# Patient Record
Sex: Female | Born: 1950 | Race: Black or African American | Hispanic: No | Smoking: Former smoker
Health system: Southern US, Community
[De-identification: ages and names within clinical notes are randomized; demographics above are authoritative.]

## PROBLEM LIST (undated history)

## (undated) DIAGNOSIS — R0602 Shortness of breath: Secondary | ICD-10-CM

## (undated) DIAGNOSIS — J449 Chronic obstructive pulmonary disease, unspecified: Secondary | ICD-10-CM

## (undated) DIAGNOSIS — M549 Dorsalgia, unspecified: Secondary | ICD-10-CM

## (undated) DIAGNOSIS — M1990 Unspecified osteoarthritis, unspecified site: Secondary | ICD-10-CM

## (undated) DIAGNOSIS — I739 Peripheral vascular disease, unspecified: Secondary | ICD-10-CM

## (undated) DIAGNOSIS — I2510 Atherosclerotic heart disease of native coronary artery without angina pectoris: Secondary | ICD-10-CM

## (undated) DIAGNOSIS — G4730 Sleep apnea, unspecified: Secondary | ICD-10-CM

## (undated) DIAGNOSIS — K5900 Constipation, unspecified: Secondary | ICD-10-CM

## (undated) DIAGNOSIS — M069 Rheumatoid arthritis, unspecified: Secondary | ICD-10-CM

## (undated) DIAGNOSIS — F17200 Nicotine dependence, unspecified, uncomplicated: Secondary | ICD-10-CM

## (undated) DIAGNOSIS — G4733 Obstructive sleep apnea (adult) (pediatric): Secondary | ICD-10-CM

## (undated) DIAGNOSIS — E785 Hyperlipidemia, unspecified: Secondary | ICD-10-CM

## (undated) DIAGNOSIS — E559 Vitamin D deficiency, unspecified: Secondary | ICD-10-CM

## (undated) DIAGNOSIS — R7303 Prediabetes: Secondary | ICD-10-CM

## (undated) DIAGNOSIS — I10 Essential (primary) hypertension: Secondary | ICD-10-CM

## (undated) DIAGNOSIS — F32A Depression, unspecified: Secondary | ICD-10-CM

## (undated) DIAGNOSIS — I252 Old myocardial infarction: Secondary | ICD-10-CM

## (undated) DIAGNOSIS — I519 Heart disease, unspecified: Secondary | ICD-10-CM

## (undated) DIAGNOSIS — Z86718 Personal history of other venous thrombosis and embolism: Secondary | ICD-10-CM

## (undated) DIAGNOSIS — M109 Gout, unspecified: Secondary | ICD-10-CM

## (undated) DIAGNOSIS — M2550 Pain in unspecified joint: Secondary | ICD-10-CM

## (undated) DIAGNOSIS — Z9989 Dependence on other enabling machines and devices: Secondary | ICD-10-CM

## (undated) DIAGNOSIS — E039 Hypothyroidism, unspecified: Secondary | ICD-10-CM

## (undated) DIAGNOSIS — I6529 Occlusion and stenosis of unspecified carotid artery: Secondary | ICD-10-CM

## (undated) DIAGNOSIS — I219 Acute myocardial infarction, unspecified: Secondary | ICD-10-CM

## (undated) DIAGNOSIS — F419 Anxiety disorder, unspecified: Secondary | ICD-10-CM

## (undated) DIAGNOSIS — I8390 Asymptomatic varicose veins of unspecified lower extremity: Secondary | ICD-10-CM

## (undated) DIAGNOSIS — F329 Major depressive disorder, single episode, unspecified: Secondary | ICD-10-CM

## (undated) HISTORY — DX: Hypothyroidism, unspecified: E03.9

## (undated) HISTORY — DX: Hyperlipidemia, unspecified: E78.5

## (undated) HISTORY — DX: Pain in unspecified joint: M25.50

## (undated) HISTORY — DX: Heart disease, unspecified: I51.9

## (undated) HISTORY — DX: Nicotine dependence, unspecified, uncomplicated: F17.200

## (undated) HISTORY — DX: Chronic obstructive pulmonary disease, unspecified: J44.9

## (undated) HISTORY — DX: Occlusion and stenosis of unspecified carotid artery: I65.29

## (undated) HISTORY — DX: Rheumatoid arthritis, unspecified: M06.9

## (undated) HISTORY — DX: Personal history of other venous thrombosis and embolism: Z86.718

## (undated) HISTORY — DX: Asymptomatic varicose veins of unspecified lower extremity: I83.90

## (undated) HISTORY — DX: Sleep apnea, unspecified: G47.30

## (undated) HISTORY — DX: Shortness of breath: R06.02

## (undated) HISTORY — DX: Old myocardial infarction: I25.2

## (undated) HISTORY — DX: Unspecified osteoarthritis, unspecified site: M19.90

## (undated) HISTORY — DX: Essential (primary) hypertension: I10

## (undated) HISTORY — DX: Peripheral vascular disease, unspecified: I73.9

## (undated) HISTORY — DX: Dorsalgia, unspecified: M54.9

## (undated) HISTORY — DX: Atherosclerotic heart disease of native coronary artery without angina pectoris: I25.10

## (undated) HISTORY — DX: Constipation, unspecified: K59.00

## (undated) HISTORY — PX: RADIOACTIVE PLAQUE INSERTION: SHX2288

## (undated) HISTORY — DX: Prediabetes: R73.03

## (undated) HISTORY — PX: CORONARY ANGIOPLASTY: SHX604

## (undated) HISTORY — DX: Vitamin D deficiency, unspecified: E55.9

## (undated) HISTORY — PX: CAROTID STENT: SHX1301

---

## 1997-08-21 ENCOUNTER — Other Ambulatory Visit: Admission: RE | Admit: 1997-08-21 | Discharge: 1997-08-21 | Payer: Self-pay | Admitting: Internal Medicine

## 1998-03-16 ENCOUNTER — Other Ambulatory Visit: Admission: RE | Admit: 1998-03-16 | Discharge: 1998-03-16 | Payer: Self-pay | Admitting: Obstetrics and Gynecology

## 1998-08-10 ENCOUNTER — Encounter: Admission: RE | Admit: 1998-08-10 | Discharge: 1998-08-10 | Payer: Self-pay | Admitting: Internal Medicine

## 1998-08-31 ENCOUNTER — Encounter: Admission: RE | Admit: 1998-08-31 | Discharge: 1998-08-31 | Payer: Self-pay | Admitting: Internal Medicine

## 1998-12-04 ENCOUNTER — Encounter: Payer: Self-pay | Admitting: Internal Medicine

## 1998-12-04 ENCOUNTER — Ambulatory Visit (HOSPITAL_COMMUNITY): Admission: RE | Admit: 1998-12-04 | Discharge: 1998-12-04 | Payer: Self-pay | Admitting: Internal Medicine

## 1998-12-05 ENCOUNTER — Encounter: Payer: Self-pay | Admitting: Internal Medicine

## 1998-12-05 ENCOUNTER — Ambulatory Visit (HOSPITAL_COMMUNITY): Admission: RE | Admit: 1998-12-05 | Discharge: 1998-12-05 | Payer: Self-pay | Admitting: Internal Medicine

## 1998-12-06 ENCOUNTER — Inpatient Hospital Stay (HOSPITAL_COMMUNITY): Admission: AD | Admit: 1998-12-06 | Discharge: 1998-12-10 | Payer: Self-pay | Admitting: Internal Medicine

## 1999-01-11 ENCOUNTER — Ambulatory Visit (HOSPITAL_COMMUNITY): Admission: RE | Admit: 1999-01-11 | Discharge: 1999-01-12 | Payer: Self-pay | Admitting: Interventional Radiology

## 1999-02-25 ENCOUNTER — Encounter: Payer: Self-pay | Admitting: Internal Medicine

## 1999-02-25 ENCOUNTER — Ambulatory Visit (HOSPITAL_COMMUNITY): Admission: RE | Admit: 1999-02-25 | Discharge: 1999-02-25 | Payer: Self-pay | Admitting: Internal Medicine

## 1999-11-01 ENCOUNTER — Encounter: Payer: Self-pay | Admitting: Gynecology

## 1999-11-01 ENCOUNTER — Encounter: Admission: RE | Admit: 1999-11-01 | Discharge: 1999-11-01 | Payer: Self-pay | Admitting: Gynecology

## 2000-05-19 ENCOUNTER — Other Ambulatory Visit: Admission: RE | Admit: 2000-05-19 | Discharge: 2000-05-19 | Payer: Self-pay | Admitting: Internal Medicine

## 2001-10-11 ENCOUNTER — Ambulatory Visit (HOSPITAL_COMMUNITY): Admission: RE | Admit: 2001-10-11 | Discharge: 2001-10-11 | Payer: Self-pay | Admitting: Interventional Radiology

## 2001-10-28 ENCOUNTER — Ambulatory Visit (HOSPITAL_COMMUNITY): Admission: RE | Admit: 2001-10-28 | Discharge: 2001-10-28 | Payer: Self-pay | Admitting: Internal Medicine

## 2002-10-16 ENCOUNTER — Encounter: Payer: Self-pay | Admitting: Emergency Medicine

## 2002-10-16 ENCOUNTER — Emergency Department (HOSPITAL_COMMUNITY): Admission: EM | Admit: 2002-10-16 | Discharge: 2002-10-16 | Payer: Self-pay | Admitting: Emergency Medicine

## 2004-01-06 ENCOUNTER — Emergency Department (HOSPITAL_COMMUNITY): Admission: EM | Admit: 2004-01-06 | Discharge: 2004-01-06 | Payer: Self-pay | Admitting: Emergency Medicine

## 2004-01-07 ENCOUNTER — Ambulatory Visit (HOSPITAL_COMMUNITY): Admission: RE | Admit: 2004-01-07 | Discharge: 2004-01-07 | Payer: Self-pay | Admitting: Emergency Medicine

## 2005-04-17 ENCOUNTER — Encounter: Admission: RE | Admit: 2005-04-17 | Discharge: 2005-04-17 | Payer: Self-pay | Admitting: Internal Medicine

## 2006-08-04 ENCOUNTER — Encounter: Admission: RE | Admit: 2006-08-04 | Discharge: 2006-08-04 | Payer: Self-pay | Admitting: Internal Medicine

## 2007-12-24 ENCOUNTER — Emergency Department (HOSPITAL_COMMUNITY): Admission: EM | Admit: 2007-12-24 | Discharge: 2007-12-24 | Payer: Self-pay | Admitting: Emergency Medicine

## 2009-05-04 ENCOUNTER — Encounter: Payer: Self-pay | Admitting: Internal Medicine

## 2009-05-04 ENCOUNTER — Encounter: Admission: RE | Admit: 2009-05-04 | Discharge: 2009-05-04 | Payer: Self-pay | Admitting: Family Medicine

## 2009-05-04 LAB — CONVERTED CEMR LAB
ALT: 12 units/L
BUN: 13 mg/dL
Calcium: 9.4 mg/dL
Chloride: 104 meq/L
Creatinine, Ser: 0.81 mg/dL
Glucose, Bld: 80 mg/dL
HCT: 43.5 %
Hemoglobin: 14.8 g/dL
Potassium: 4.1 meq/L
RDW: 13.7 %
Sodium: 140 meq/L
TSH: 1.79 microintl units/mL
Total Protein: 7.3 g/dL
WBC: 9.2 10*3/uL

## 2009-05-11 ENCOUNTER — Encounter: Payer: Self-pay | Admitting: Internal Medicine

## 2009-05-11 ENCOUNTER — Encounter: Admission: RE | Admit: 2009-05-11 | Discharge: 2009-05-11 | Payer: Self-pay | Admitting: Family Medicine

## 2009-05-11 LAB — HM MAMMOGRAPHY: HM Mammogram: NEGATIVE

## 2009-06-15 DIAGNOSIS — I2510 Atherosclerotic heart disease of native coronary artery without angina pectoris: Secondary | ICD-10-CM

## 2009-06-15 HISTORY — DX: Atherosclerotic heart disease of native coronary artery without angina pectoris: I25.10

## 2009-06-29 ENCOUNTER — Ambulatory Visit: Payer: Self-pay | Admitting: Cardiovascular Disease

## 2009-06-30 ENCOUNTER — Ambulatory Visit: Payer: Self-pay | Admitting: Diagnostic Radiology

## 2009-06-30 ENCOUNTER — Observation Stay (HOSPITAL_COMMUNITY): Admission: EM | Admit: 2009-06-30 | Discharge: 2009-07-03 | Payer: Self-pay | Admitting: Internal Medicine

## 2009-06-30 ENCOUNTER — Ambulatory Visit: Payer: Self-pay | Admitting: Cardiovascular Disease

## 2009-06-30 ENCOUNTER — Encounter: Payer: Self-pay | Admitting: Emergency Medicine

## 2009-07-01 ENCOUNTER — Encounter (INDEPENDENT_AMBULATORY_CARE_PROVIDER_SITE_OTHER): Payer: Self-pay | Admitting: Internal Medicine

## 2009-07-02 ENCOUNTER — Encounter: Payer: Self-pay | Admitting: Cardiology

## 2009-07-02 ENCOUNTER — Encounter (INDEPENDENT_AMBULATORY_CARE_PROVIDER_SITE_OTHER): Payer: Self-pay | Admitting: Internal Medicine

## 2009-07-02 ENCOUNTER — Ambulatory Visit: Payer: Self-pay | Admitting: Vascular Surgery

## 2009-07-13 ENCOUNTER — Telehealth: Payer: Self-pay | Admitting: Cardiology

## 2009-07-17 ENCOUNTER — Telehealth (INDEPENDENT_AMBULATORY_CARE_PROVIDER_SITE_OTHER): Payer: Self-pay | Admitting: *Deleted

## 2009-07-23 DIAGNOSIS — E890 Postprocedural hypothyroidism: Secondary | ICD-10-CM | POA: Insufficient documentation

## 2009-07-23 DIAGNOSIS — I10 Essential (primary) hypertension: Secondary | ICD-10-CM | POA: Insufficient documentation

## 2009-07-23 DIAGNOSIS — Z862 Personal history of diseases of the blood and blood-forming organs and certain disorders involving the immune mechanism: Secondary | ICD-10-CM | POA: Insufficient documentation

## 2009-07-23 DIAGNOSIS — Z8639 Personal history of other endocrine, nutritional and metabolic disease: Secondary | ICD-10-CM

## 2009-07-23 DIAGNOSIS — E785 Hyperlipidemia, unspecified: Secondary | ICD-10-CM | POA: Insufficient documentation

## 2009-07-23 DIAGNOSIS — I739 Peripheral vascular disease, unspecified: Secondary | ICD-10-CM | POA: Insufficient documentation

## 2009-07-23 DIAGNOSIS — E039 Hypothyroidism, unspecified: Secondary | ICD-10-CM | POA: Insufficient documentation

## 2009-07-23 DIAGNOSIS — I2510 Atherosclerotic heart disease of native coronary artery without angina pectoris: Secondary | ICD-10-CM | POA: Insufficient documentation

## 2009-07-24 ENCOUNTER — Ambulatory Visit: Payer: Self-pay | Admitting: Cardiology

## 2009-07-24 ENCOUNTER — Encounter: Payer: Self-pay | Admitting: Cardiology

## 2009-07-24 DIAGNOSIS — I6529 Occlusion and stenosis of unspecified carotid artery: Secondary | ICD-10-CM | POA: Insufficient documentation

## 2009-07-30 ENCOUNTER — Telehealth: Payer: Self-pay | Admitting: Cardiology

## 2009-08-02 ENCOUNTER — Encounter (HOSPITAL_COMMUNITY): Admission: RE | Admit: 2009-08-02 | Discharge: 2009-09-10 | Payer: Self-pay | Admitting: Cardiology

## 2009-08-04 ENCOUNTER — Encounter: Payer: Self-pay | Admitting: Cardiology

## 2009-08-09 ENCOUNTER — Telehealth: Payer: Self-pay | Admitting: Cardiology

## 2009-08-10 ENCOUNTER — Encounter: Payer: Self-pay | Admitting: Cardiology

## 2009-08-10 ENCOUNTER — Telehealth (INDEPENDENT_AMBULATORY_CARE_PROVIDER_SITE_OTHER): Payer: Self-pay | Admitting: Physician Assistant

## 2009-08-17 ENCOUNTER — Encounter: Payer: Self-pay | Admitting: Cardiology

## 2009-08-17 ENCOUNTER — Telehealth: Payer: Self-pay | Admitting: Nurse Practitioner

## 2009-08-22 ENCOUNTER — Encounter: Payer: Self-pay | Admitting: Cardiology

## 2009-08-23 ENCOUNTER — Telehealth: Payer: Self-pay | Admitting: Cardiology

## 2009-08-27 ENCOUNTER — Ambulatory Visit: Payer: Self-pay | Admitting: Cardiology

## 2009-08-27 ENCOUNTER — Ambulatory Visit: Payer: Self-pay

## 2009-08-27 ENCOUNTER — Telehealth (INDEPENDENT_AMBULATORY_CARE_PROVIDER_SITE_OTHER): Payer: Self-pay | Admitting: *Deleted

## 2009-08-27 LAB — CONVERTED CEMR LAB
ALT: 14 units/L (ref 0–35)
AST: 19 units/L (ref 0–37)
Albumin: 4 g/dL (ref 3.5–5.2)
Alkaline Phosphatase: 79 units/L (ref 39–117)
Cholesterol: 150 mg/dL (ref 0–200)
LDL Cholesterol: 81 mg/dL (ref 0–99)
Total Bilirubin: 0.4 mg/dL (ref 0.3–1.2)
VLDL: 14.2 mg/dL (ref 0.0–40.0)

## 2009-08-29 ENCOUNTER — Encounter: Payer: Self-pay | Admitting: Cardiology

## 2009-08-30 ENCOUNTER — Encounter: Payer: Self-pay | Admitting: Cardiology

## 2009-08-31 ENCOUNTER — Ambulatory Visit: Payer: Self-pay | Admitting: Cardiology

## 2009-10-30 ENCOUNTER — Encounter: Payer: Self-pay | Admitting: Cardiology

## 2009-11-04 ENCOUNTER — Observation Stay (HOSPITAL_COMMUNITY): Admission: EM | Admit: 2009-11-04 | Discharge: 2009-11-05 | Payer: Self-pay | Admitting: Emergency Medicine

## 2009-11-04 ENCOUNTER — Ambulatory Visit: Payer: Self-pay | Admitting: Cardiology

## 2009-11-04 LAB — CONVERTED CEMR LAB: TSH: 5.08 microintl units/mL

## 2009-11-05 ENCOUNTER — Encounter: Payer: Self-pay | Admitting: Cardiology

## 2009-11-12 ENCOUNTER — Telehealth (INDEPENDENT_AMBULATORY_CARE_PROVIDER_SITE_OTHER): Payer: Self-pay | Admitting: *Deleted

## 2009-11-13 ENCOUNTER — Encounter: Payer: Self-pay | Admitting: Internal Medicine

## 2009-11-13 ENCOUNTER — Ambulatory Visit: Payer: Self-pay

## 2009-11-13 ENCOUNTER — Encounter (HOSPITAL_COMMUNITY): Admission: RE | Admit: 2009-11-13 | Discharge: 2009-12-05 | Payer: Self-pay | Admitting: Cardiology

## 2009-11-13 ENCOUNTER — Ambulatory Visit: Payer: Self-pay | Admitting: Internal Medicine

## 2009-11-20 ENCOUNTER — Ambulatory Visit: Payer: Self-pay

## 2009-11-23 ENCOUNTER — Telehealth: Payer: Self-pay | Admitting: Cardiology

## 2009-12-11 ENCOUNTER — Ambulatory Visit: Payer: Self-pay | Admitting: Cardiology

## 2010-01-09 ENCOUNTER — Telehealth: Payer: Self-pay | Admitting: Cardiology

## 2010-01-28 ENCOUNTER — Telehealth: Payer: Self-pay | Admitting: Cardiology

## 2010-02-04 ENCOUNTER — Encounter: Payer: Self-pay | Admitting: Internal Medicine

## 2010-02-04 ENCOUNTER — Telehealth (INDEPENDENT_AMBULATORY_CARE_PROVIDER_SITE_OTHER): Payer: Self-pay | Admitting: *Deleted

## 2010-02-04 LAB — CONVERTED CEMR LAB
Albumin: 4.1 g/dL
BUN: 17 mg/dL
Cholesterol: 232 mg/dL
Glucose, Bld: 74 mg/dL
HDL: 58 mg/dL
TSH: 1.69 microintl units/mL
Triglyceride fasting, serum: 114 mg/dL

## 2010-02-22 ENCOUNTER — Telehealth: Payer: Self-pay | Admitting: Cardiology

## 2010-02-28 ENCOUNTER — Ambulatory Visit: Payer: Self-pay | Admitting: Cardiology

## 2010-03-07 ENCOUNTER — Ambulatory Visit: Payer: Self-pay | Admitting: Cardiology

## 2010-03-07 ENCOUNTER — Encounter: Payer: Self-pay | Admitting: Cardiology

## 2010-03-07 DIAGNOSIS — R5381 Other malaise: Secondary | ICD-10-CM | POA: Insufficient documentation

## 2010-03-07 DIAGNOSIS — R5383 Other fatigue: Secondary | ICD-10-CM

## 2010-03-13 ENCOUNTER — Ambulatory Visit: Payer: Self-pay | Admitting: Internal Medicine

## 2010-03-15 DIAGNOSIS — K120 Recurrent oral aphthae: Secondary | ICD-10-CM | POA: Insufficient documentation

## 2010-03-15 DIAGNOSIS — F17200 Nicotine dependence, unspecified, uncomplicated: Secondary | ICD-10-CM | POA: Insufficient documentation

## 2010-03-15 DIAGNOSIS — E559 Vitamin D deficiency, unspecified: Secondary | ICD-10-CM | POA: Insufficient documentation

## 2010-03-17 DIAGNOSIS — I219 Acute myocardial infarction, unspecified: Secondary | ICD-10-CM

## 2010-03-17 HISTORY — DX: Acute myocardial infarction, unspecified: I21.9

## 2010-03-22 ENCOUNTER — Ambulatory Visit: Admission: RE | Admit: 2010-03-22 | Discharge: 2010-03-22 | Payer: Self-pay | Source: Home / Self Care

## 2010-03-22 ENCOUNTER — Encounter: Payer: Self-pay | Admitting: Cardiovascular Disease

## 2010-03-28 ENCOUNTER — Telehealth: Payer: Self-pay | Admitting: Internal Medicine

## 2010-03-29 ENCOUNTER — Telehealth: Payer: Self-pay | Admitting: Internal Medicine

## 2010-04-01 ENCOUNTER — Ambulatory Visit
Admission: RE | Admit: 2010-04-01 | Discharge: 2010-04-01 | Payer: Self-pay | Source: Home / Self Care | Attending: Cardiovascular Disease | Admitting: Cardiovascular Disease

## 2010-04-01 ENCOUNTER — Telehealth: Payer: Self-pay | Admitting: Internal Medicine

## 2010-04-01 ENCOUNTER — Other Ambulatory Visit: Payer: Self-pay | Admitting: Cardiovascular Disease

## 2010-04-01 LAB — LIPID PANEL
Cholesterol: 199 mg/dL (ref 0–200)
HDL: 48.8 mg/dL (ref >39.00)
LDL Cholesterol: 123 mg/dL — ABNORMAL HIGH (ref 0–99)
Total CHOL/HDL Ratio: 4
Triglycerides: 135 mg/dL (ref 0.0–149.0)
VLDL: 27 mg/dL (ref 0.0–40.0)

## 2010-04-01 LAB — HEPATIC FUNCTION PANEL
ALT: 16 U/L (ref 0–35)
AST: 20 U/L (ref 0–37)
Albumin: 3.9 g/dL (ref 3.5–5.2)
Alkaline Phosphatase: 76 U/L (ref 39–117)
Bilirubin, Direct: 0.1 mg/dL (ref 0.0–0.3)
Total Bilirubin: 0.3 mg/dL (ref 0.3–1.2)
Total Protein: 7 g/dL (ref 6.0–8.3)

## 2010-04-02 ENCOUNTER — Telehealth: Payer: Self-pay | Admitting: Cardiovascular Disease

## 2010-04-10 ENCOUNTER — Telehealth: Payer: Self-pay | Admitting: Internal Medicine

## 2010-04-15 ENCOUNTER — Telehealth: Payer: Self-pay | Admitting: Cardiovascular Disease

## 2010-04-16 NOTE — Assessment & Plan Note (Signed)
Summary: Cardiology Nuclear Testing  Nuclear Med Background Indications for Stress Test: Evaluation for Ischemia, Post Hospital  Indications Comments: Discharge from hospital 11/05/09 for CP negative enzymes  History: Heart Catheterization, Myocardial Infarction, Stents  History Comments: 4/11 MI-NSTEMI 5/11Cath 90%RCA,Mod residual CAD and stent x2  RCA.  Symptoms: Chest Pain, Fatigue    Nuclear Pre-Procedure Cardiac Risk Factors: Carotid Disease, Family History - CAD, History of Smoking, Hypertension, Lipids Caffeine/Decaff Intake: None NPO After: 8:00 PM Lungs: clear IV 0.9% NS with Angio Cath: 20g     IV Site: R Antecubital IV Started by: Irean Hong, RN Chest Size (in) 44     Cup Size D     Height (in): 67 Weight (lb): 231 BMI: 36.31  Nuclear Med Study 1 or 2 day study:  2 day     Stress Test Type:  Stress Reading MD:  Olga Millers, MD     Referring MD:  Mike Gip Resting Radionuclide:  Technetium 88m Tetrofosmin     Resting Radionuclide Dose:  32.7 mCi  Stress Radionuclide:  Technetium 44m Tetrofosmin     Stress Radionuclide Dose:  33 mCi   Stress Protocol Exercise Time (min):  4:54 min     Max HR:  134 bpm     Predicted Max HR:  164 bpm  Max Systolic BP: 213 mm Hg     Percent Max HR:  81.71 %     METS: 6.9 Rate Pressure Product:  16109    Stress Test Technologist:  Cathlyn Parsons, RN     Nuclear Technologist:  Doyne Keel, CNMT  Rest Procedure  Myocardial perfusion imaging was performed at rest 45 minutes following the intravenous administration of Technetium 74m Tetrofosmin.  Stress Procedure  The patient exercised for 4:54 .  The patient stopped due to fatigue and hypertensive BP response.  Pt denied any chest pain.  There were nonspecific ST-T wave changes.  Patient had frequent PVC's with couplet and trigeminy.Technetium 43m Tetrofosmin was injected at peak exercise and myocardial perfusion imaging was performed after a brief delay.  QPS Raw  Data Images:  Normal; no motion artifact; normal heart/lung ratio. Stress Images:  Mildly decreased uptake in the anterior wall Rest Images:  Decreased uptake in the anterior wall Subtraction (SDS):  There is decreased uptake in the anterior wall on the rest images which improves with stress. This is consistent with breast attenuation. No ischemia. Transient Ischemic Dilatation:  .89  (Normal <1.22)  Lung/Heart Ratio:  .28  (Normal <0.45)  Quantitative Gated Spect Images QGS EDV:  93 ml QGS ESV:  37 ml QGS EF:  60 % QGS cine images:  Normal  Findings Normal nuclear study      Overall Impression  Exercise Capacity: Fair exercise capacity. BP Response: Hypertensive blood pressure response. Clinical Symptoms: Fatigue ECG Impression: No significant ST segment change suggestive of ischemia. Overall Impression: Normal stress nuclear study. Overall Impression Comments: There is decreased uptake in the anterior wall on the rest images which improves with stress. This is consistent with breast attenuation. No ischemia.  Appended Document: Cardiology Nuclear Testing I left a message of the pt's results on her identified VM.  Appended Document: Cardiology Nuclear Testing ok

## 2010-04-16 NOTE — Miscellaneous (Signed)
Summary: [facility]   [facility] Note   Imported By: Roderic Ovens 09/14/2009 10:14:23  _____________________________________________________________________  External Attachment:    Type:   Image     Comment:   External Document

## 2010-04-16 NOTE — Miscellaneous (Signed)
Summary: [facility]   [facility] Note   Imported By: Roderic Ovens 09/18/2009 13:07:37  _____________________________________________________________________  External Attachment:    Type:   Image     Comment:   External Document

## 2010-04-16 NOTE — Progress Notes (Signed)
  Phone Note Call from Patient   Call For: Dr Charlies Constable Reason for Call: Talk to Doctor Details for Reason: Low Heart Rate Summary of Call: Pt seen in rehab w/ bradycardia, HR 40s. C/O fatigue, sleeping poorly. With low HR advised her OK to take 1/2 Lopressor25mg  two times a day. Do not stop it w/out calling. OK to take Benadryl at bedtime to sleep as needed. F/U w/ Dr Juanda Chance - pls call for appt.   Initial call taken by: Park Breed PA-C,  Aug 10, 2009 3:43 PM

## 2010-04-16 NOTE — Miscellaneous (Signed)
Summary: med list  Clinical Lists Changes  Medications: Changed medication from DRISDOL 69629 UNIT CAPS (ERGOCALCIFEROL) 1 capsule on mon-and-thursday to DRISDOL 52841 UNIT CAPS (ERGOCALCIFEROL) 1 capsule on mon-and-thursday(VIT D-2) Observations: Added new observation of MEDRECON: current updated (12/11/2009 9:48)      Current Medications (verified): 1)  Aspirin Ec 325 Mg Tbec (Aspirin) .... Take One Tablet By Mouth Daily 2)  Amlodipine Besylate 10 Mg Tabs (Amlodipine Besylate) .... Take One Tablet By Mouth Daily 3)  Plavix 75 Mg Tabs (Clopidogrel Bisulfate) .Marland Kitchen.. 1 Tab Qd 4)  Accupril 20 Mg Tabs (Quinapril Hcl) .Marland Kitchen.. 1 Tab Once Daily 5)  Nitrostat 0.4 Mg Subl (Nitroglycerin) .Marland Kitchen.. 1 Tablet Under Tongue At Onset of Chest Pain; You May Repeat Every 5 Minutes For Up To 3 Doses. 6)  Synthroid 100 Mcg Tabs (Levothyroxine Sodium) .Marland Kitchen.. 1 Tab Once Daily 7)  Drisdol 32440 Unit Caps (Ergocalciferol) .Marland Kitchen.. 1 Capsule On Mon-And-Thursday(Vit D-2) 8)  Crestor 40 Mg Tabs (Rosuvastatin Calcium) .Marland Kitchen.. 1 Tab Once Daily  Allergies: No Known Drug Allergies  Appended Document: med list/added test results nuc    Clinical Lists Changes  Observations: Added new observation of NUCLEAR NOS:  Overall Impression   Exercise Capacity: Fair exercise capacity. BP Response: Hypertensive blood pressure response. Clinical Symptoms: Fatigue ECG Impression: No significant ST segment change suggestive of ischemia. Overall Impression: Normal stress nuclear study. Overall Impression Comments: There is decreased uptake in the anterior wall on the rest images which improves with stress. This is consistent with breast attenuation. No ischemia.  (11/13/2009 9:51) Added new observation of CXR RESULTS:    IMPRESSION:   No acute cardiopulmonary process.  (11/04/2009 9:54)       Nuclear Study  Procedure date:  11/13/2009  Findings:       Overall Impression   Exercise Capacity: Fair exercise capacity. BP  Response: Hypertensive blood pressure response. Clinical Symptoms: Fatigue ECG Impression: No significant ST segment change suggestive of ischemia. Overall Impression: Normal stress nuclear study. Overall Impression Comments: There is decreased uptake in the anterior wall on the rest images which improves with stress. This is consistent with breast attenuation. No ischemia.   CXR  Procedure date:  11/04/2009  Findings:         IMPRESSION:   No acute cardiopulmonary process.

## 2010-04-16 NOTE — Progress Notes (Signed)
  Phone Note From Other Clinic   Caller: Angie/Dr.Turnbull Initial call taken by: KM    LOV faxed to [phone] [facility]  February 04, 2010 10:40 AM

## 2010-04-16 NOTE — Progress Notes (Signed)
Summary: refill  Phone Note Refill Request Message from:  Patient on January 28, 2010 4:27 PM  Refills Requested: Medication #1:  ACCUPRIL 20 MG TABS take one tablet by mouth once daily along with a 10mg  accupril tablet once daily Health department [phone]  Initial call taken by: Judie Grieve,  January 28, 2010 4:28 PM  Follow-up for Phone Call       Follow-up by: Judithe Modest CMA,  January 28, 2010 4:47 PM    Prescriptions: ACCUPRIL 20 MG TABS (QUINAPRIL HCL) take one tablet by mouth once daily along with a 10mg  accupril tablet once daily  #90 x 3   Entered by:   Judithe Modest CMA   Authorized by:   Lenoria Farrier, MD, [facility]   Signed by:   Judithe Modest CMA on 01/28/2010   Method used:   Faxed to ...       [facility] (retail)             [place], [place]         Ph:        Fax: [phone]   RxID:   [id number]

## 2010-04-16 NOTE — Progress Notes (Signed)
Summary:  have questions  Phone Note Call from Patient Call back at Home Phone [phone]   Caller: Patient Reason for Call: Talk to Nurse Initial call taken by: Judie Grieve,  January 09, 2010 9:35 AM  Follow-up for Phone Call        LVMTCB. Whitney Maeola Sarah RN  January 09, 2010 11:46 AM  Pt. still having leg cramps from her cholesterol med. but doesn't want to change or try another med. Her biggest concern is that the home health RN is telling her that she should be on a beta blocker. Her metoprolol was put on hold a few months ago during cardiac rehab per dr.brodie for bradycardia. Pt. still just not feeing good & is wondering if she should start back on a low dose beta blocker. I told her I would forward this to Dr.Brodie's RN to discuss with him in clinic tomorrow. Whitney Maeola Sarah RN  January 09, 2010 11:58 AM   Follow-up by: Whitney Maeola Sarah RN,  January 09, 2010 11:46 AM  Additional Follow-up for Phone Call Additional follow up Details #1::        pt would like to talk to a nurse Additional Follow-up by: Roe Coombs,  January 10, 2010 3:12 PM    Additional Follow-up for Phone Call Additional follow up Details #2::    I spoke with the pt. I had reviewed the above message with Dr. Juanda Chance earlier today, but he had to leave the office. He did recommend the pt restart her Crestor and f/u with her PCP regarding her leg pain. I mentioned the beta blocker to him and he stated he would likely not restart that since it was d/c'ed due to bradycardia. I explained to the pt I would call her back tomorrow regarding this. She verbalizes understanding. She would also like RX's sent in to [facility] on [address] for Accupril 20mg  one tab by mouth once daily & Accupril 10mg  one tab by mouth once daily so she does not have to cut the tabs in half to make 30mg  once daily. I will send these in for her. Sherri Rad, RN, BSN  January 10, 2010 3:48 PM   Per Dr. Juanda Chance, no beta blocker needed. I  left a message on the pt's identified voice mail about this. Follow-up by: Sherri Rad, RN, BSN,  January 11, 2010 3:41 PM  New/Updated Medications: ACCUPRIL 20 MG TABS (QUINAPRIL HCL) take one tablet by mouth once daily along with a 10mg  accupril tablet once daily ACCUPRIL 10 MG TABS (QUINAPRIL HCL) take one tablet by mouth once daily along with accupril 20mg  once daily. Prescriptions: ACCUPRIL 10 MG TABS (QUINAPRIL HCL) take one tablet by mouth once daily along with accupril 20mg  once daily.  #90 x 3   Entered by:   Sherri Rad, RN, BSN   Authorized by:   Lenoria Farrier, MD, [facility]   Signed by:   Sherri Rad, RN, BSN on 01/10/2010   Method used:   Electronically to        [facility] (retail)       [address]       [place], [place]  [address]       Ph: [phone]       Fax: [phone]   RxID:   [id number] ACCUPRIL 20 MG TABS (QUINAPRIL HCL) take one tablet by mouth once daily along with a 10mg  accupril tablet once daily  #90 x  3   Entered by:   Sherri Rad, RN, BSN   Authorized by:   Lenoria Farrier, MD, [facility]   Signed by:   Sherri Rad, RN, BSN on 01/10/2010   Method used:   Electronically to        [facility] (retail)       [address]       [place], [place]  [address]       Ph: [phone]       Fax: [phone]   RxID:   [id number]

## 2010-04-16 NOTE — Miscellaneous (Signed)
Summary: [facility] Physician Response to Notification of Patient Event  [facility] Response to Notification of Patient Event   Imported By: Roderic Ovens 09/18/2009 12:57:39  _____________________________________________________________________  External Attachment:    Type:   Image     Comment:   External Document

## 2010-04-16 NOTE — Progress Notes (Signed)
Summary: restriction  Phone Note Call from Patient Call back at Home Phone [phone]   Caller: Patient Reason for Call: Talk to Nurse Summary of Call: can pt return to work prior to appt. pt express concern that she cannot be on restriction while she at work. Initial call taken by: Lorne Skeens,  July 13, 2009 2:03 PM  Follow-up for Phone Call        Pt left paperwork here today FMLA and [place] of absence forms.  Wants to make sure they are filled out for her job with [employer] as she works in Network engineer.  Ms. Vanrossum understands her dc orders of no lifting for 2 weeks and her follow-up appt on 07/24/09 with Dr. Juanda Chance.   Will forward to [facility] & Dr. Juanda Chance. Lisabeth Devoid RN Follow-up by: Lenoria Farrier, MD, [facility],  Jul 23, 2009 12:05 AM  Additional Follow-up for Phone Call Additional follow up Details #1::        Herbert Seta, OK to return to work 2 wks after MI without restriction if she feels she is ready. BB Additional Follow-up by: Lenoria Farrier, MD, [facility],  Jul 23, 2009 12:06 AM

## 2010-04-16 NOTE — Miscellaneous (Signed)
Summary: [facility] Progress Note   [facility] Progress Note   Imported By: Roderic Ovens 12/20/2009 15:59:34  _____________________________________________________________________  External Attachment:    Type:   Image     Comment:   External Document

## 2010-04-16 NOTE — Progress Notes (Signed)
Summary: pt rtn call  Phone Note Call from Patient Call back at Home Phone [phone]   Caller: Patient Reason for Call: Talk to Nurse, Talk to Doctor Summary of Call: pt rtn call to someone not sure who called or why Initial call taken by: Omer Jack,  February 22, 2010 3:45 PM  Follow-up for Phone Call        [facility] if she needs to. I do not see anything in her chart where someone tried to call her..?  I explained this over her VM. She has a f/u with Dr. Juanda Chance next week 12/15. Whitney Maeola Sarah RN  February 22, 2010 3:57 PM  Follow-up by: Whitney Maeola Sarah RN,  February 22, 2010 3:59 PM

## 2010-04-16 NOTE — Progress Notes (Signed)
Summary: pt has form to be filled out   Phone Note Call from Patient Call back at Home Phone [phone]   Caller: Patient Reason for Call: Talk to Nurse, Talk to Doctor Summary of Call: pt wants to talk to you regarding form she will be faxing over Initial call taken by: Omer Jack,  Aug 09, 2009 1:41 PM  Follow-up for Phone Call        Form not received as of yet. Sherri Rad, RN, BSN  Aug 09, 2009 5:32 PM  Pt calling regarding her paper work need it for work [employer]  August 16, 2009 2:33 PM Follow-up by: Lenoria Farrier, MD, [facility],  August 16, 2009 8:19 PM  Additional Follow-up for Phone Call Additional follow up Details #1::       Additional Follow-up by: Lenoria Farrier, MD, [facility],  August 16, 2009 8:19 PM    Additional Follow-up for Phone Call Additional follow up Details #2::    I left a message for the pt to call. I have still not seen any paperwork faxed through on this pt since her 5/26 phone call. Sherri Rad, RN, BSN  August 21, 2009 4:25 PM   I spoke with the pt. I made her aware I have not seen any forms on her come through the fax. She will refax this to me tomorrow with my attn on the fax. Sherri Rad, RN, BSN  August 21, 2009 4:43 PM   I checked for this form all day yesterday and did not see it come through. I still have not received this as of today. Sherri Rad, RN, BSN  August 23, 2009 5:31 PM   Additional Follow-up for Phone Call Additional follow up Details #3:: Details for Additional Follow-up Action Taken: The pt brought her form in on 6/13. She was seen on 6/17. Additional Follow-up by: Sherri Rad, RN, BSN,  September 06, 2009 10:34 AM

## 2010-04-16 NOTE — Miscellaneous (Signed)
Summary: [facility] Progress Note   [facility] Progress Note   Imported By: Roderic Ovens 09/18/2009 13:04:34  _____________________________________________________________________  External Attachment:    Type:   Image     Comment:   External Document

## 2010-04-16 NOTE — Assessment & Plan Note (Signed)
Summary: eph/jml   Visit Type:  Follow-up Primary Provider:  Dr Ursula Beath  CC:  chest discomfort- Pt wonders when she should take nitro-also pt unable to sleep.  Pt wants to know about going back to work.  History of Present Illness: The patient is 60 years old and return for followup management of CAD after her recent non-ST elevation MI. She was hospitalized with a non-ST elevation MI and underwent stenting of the right coronary with 2 drug-eluting stents by Dr. Riley Kill. Her CK-MB only went to 3.7. She did have residual disease with 50-70% stenosis in the LAD and 50% stenosis in the circumflex.  She has done well since her discharge and has had no recurrent chest pain shortness of breath or palpitations.  Her other problems include hypertension and hyperlipidemia. She also has carotid disease and has had remote left carotid stenting by interventional radiology.  She also has a history of heavy smoking up to 3 packs of cigarettes per day but she has stopped since her heart attack.  She also has Graves' disease treated with radioactive iodine in the past.  She was working to jobs before her heart attack. 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. She also works at [employer] over resort.  Current Medications (verified): 1)  Aspirin Ec 325 Mg Tbec (Aspirin) .... Take One Tablet By Mouth Daily 2)  Amlodipine Besylate 10 Mg Tabs (Amlodipine Besylate) .... Take One Tablet By Mouth Daily 3)  Plavix 75 Mg Tabs (Clopidogrel Bisulfate) .Marland Kitchen.. 1 Tab Qd 4)  Accupril 20 Mg Tabs (Quinapril Hcl) .Marland Kitchen.. 1 Tab Once Daily 5)  Nitrostat 0.4 Mg Subl (Nitroglycerin) .Marland Kitchen.. 1 Tablet Under Tongue At Onset of Chest Pain; You May Repeat Every 5 Minutes For Up To 3 Doses. 6)  Synthroid 100 Mcg Tabs (Levothyroxine Sodium) .Marland Kitchen.. 1 Tab Once Daily 7)  Drisdol 16109 Unit Caps (Ergocalciferol) .Marland Kitchen.. 1 Capsule On Mon-And-Thursday 8)  Metoprolol Tartrate 25 Mg Tabs  (Metoprolol Tartrate) .... Take One Tablet By Mouth Twice A Day 9)  Crestor 40 Mg Tabs (Rosuvastatin Calcium) .Marland Kitchen.. 1 Tab Once Daily  Allergies (verified): No Known Drug Allergies  Past History:  Past Medical History: Reviewed history from 07/23/2009 and no changes required. Current Problems:  GRAVES' DISEASE, HX OF (ICD-V12.2) PVD (ICD-443.9) HYPOTHYROIDISM (ICD-244.9) TOBACCO ABUSE (ICD-305.1) HYPERLIPIDEMIA (ICD-272.4) HYPERTENSION (ICD-401.9) CAD (ICD-414.00)  Review of Systems       ROS is negative except as outlined in HPI.   Vital Signs:  Patient profile:   60 year old female Height:      66 inches Weight:      235 pounds BMI:     38.07 Pulse rate:   47 / minute BP sitting:   106 / 68  (left arm) Cuff size:   large  Vitals Entered By: Burnett Kanaris, CNA (Jul 24, 2009 3:57 PM)  Physical Exam  Additional Exam:  Gen. Well-nourished, in no distress   Neck: No JVD, thyroid not enlarged, no carotid bruits Lungs: No tachypnea, clear without rales, rhonchi or wheezes Cardiovascular: Rhythm regular, PMI not displaced,  heart sounds  normal, no murmurs or gallops, no peripheral edema, pulses normal in all 4 extremities. Abdomen: BS normal, abdomen soft and non-tender without masses or organomegaly, no hepatosplenomegaly. MS: No deformities, no cyanosis or clubbing   Neuro:  No focal sns   Skin:  no lesions    Impression & Recommendations:  Problem # 1:  CAD (ICD-414.00)  She had  a recent non-ST elevation MI and was treated with 2 drug eluding stents the right coronary and has residual disease as described above. She's had no recurrent symptoms and is probably stable. We will try to get her into cardiac rehabilitation program. Her updated medication list for this problem includes:    Aspirin Ec 325 Mg Tbec (Aspirin) .Marland Kitchen... Take one tablet by mouth daily    Amlodipine Besylate 10 Mg Tabs (Amlodipine besylate) .Marland Kitchen... Take one tablet by mouth daily    Plavix 75 Mg Tabs  (Clopidogrel bisulfate) .Marland Kitchen... 1 tab qd    Accupril 20 Mg Tabs (Quinapril hcl) .Marland Kitchen... 1 tab once daily    Nitrostat 0.4 Mg Subl (Nitroglycerin) .Marland Kitchen... 1 tablet under tongue at onset of chest pain; you may repeat every 5 minutes for up to 3 doses.    Metoprolol Tartrate 25 Mg Tabs (Metoprolol tartrate) .Marland Kitchen... Take one tablet by mouth twice a day  Her updated medication list for this problem includes:    Aspirin Ec 325 Mg Tbec (Aspirin) .Marland Kitchen... Take one tablet by mouth daily    Amlodipine Besylate 10 Mg Tabs (Amlodipine besylate) .Marland Kitchen... Take one tablet by mouth daily    Plavix 75 Mg Tabs (Clopidogrel bisulfate) .Marland Kitchen... 1 tab qd    Accupril 20 Mg Tabs (Quinapril hcl) .Marland Kitchen... 1 tab once daily    Nitrostat 0.4 Mg Subl (Nitroglycerin) .Marland Kitchen... 1 tablet under tongue at onset of chest pain; you may repeat every 5 minutes for up to 3 doses.    Metoprolol Tartrate 25 Mg Tabs (Metoprolol tartrate) .Marland Kitchen... Take one tablet by mouth twice a day  Orders: EKG w/ Interpretation (93[address]) Cardiac Rehabilitation (Cardiac Rehab)  Problem # 2:  HYPERTENSION (ICD-401.9)  This is well-controlled on current medications. Her updated medication list for this problem includes:    Aspirin Ec 325 Mg Tbec (Aspirin) .Marland Kitchen... Take one tablet by mouth daily    Amlodipine Besylate 10 Mg Tabs (Amlodipine besylate) .Marland Kitchen... Take one tablet by mouth daily    Accupril 20 Mg Tabs (Quinapril hcl) .Marland Kitchen... 1 tab once daily    Metoprolol Tartrate 25 Mg Tabs (Metoprolol tartrate) .Marland Kitchen... Take one tablet by mouth twice a day  Her updated medication list for this problem includes:    Aspirin Ec 325 Mg Tbec (Aspirin) .Marland Kitchen... Take one tablet by mouth daily    Amlodipine Besylate 10 Mg Tabs (Amlodipine besylate) .Marland Kitchen... Take one tablet by mouth daily    Accupril 20 Mg Tabs (Quinapril hcl) .Marland Kitchen... 1 tab once daily    Metoprolol Tartrate 25 Mg Tabs (Metoprolol tartrate) .Marland Kitchen... Take one tablet by mouth twice a day  Problem # 3:  HYPERLIPIDEMIA (ICD-272.4) This is  currently being managed with Crestor. We will get a lipid and liver profile prior to her followup visit in 6 weeks. Her updated medication list for this problem includes:    Crestor 40 Mg Tabs (Rosuvastatin calcium) .Marland Kitchen... 1 tab once daily  Problem # 4:  TOBACCO ABUSE (ICD-305.1) She was formerly a heavy smoker but has not smoked since her heart attack.  Problem # 5:  CAROTID STENOSIS (ICD-433.10) She had remote stenting of the left carotid and was told that this later occluded. She had a second opinion and no further therapy was recommended. She has not had carotid duplex and a number of years we will plan to do that. Her updated medication list for this problem includes:    Aspirin Ec 325 Mg Tbec (Aspirin) .Marland Kitchen... Take one tablet by mouth daily  Plavix 75 Mg Tabs (Clopidogrel bisulfate) .Marland Kitchen... 1 tab qd  Other Orders: Carotid Duplex (Carotid Duplex)  Patient Instructions: 1)  Your physician recommends that you schedule a follow-up appointment in: 6 WEEKS WITH DR. Juanda Chance 2)  Your physician recommends that you return for lab work in: BEFORE YOUR 6 WEEK APPT WITH DR. Juanda Chance:  LIPID, LIVER-410.72, 414.01, 272.2 3)  Your physician recommends referral and attendance at a Cardiac Rehab Program. 4)  Your physician recommends a low cholesterol, low fat diet. Please see [facility] handout. 5)  Your physician has requested that you have a carotid duplex. This test is an ultrasound of the carotid arteries in your neck. It looks at blood flow through these arteries that supply the brain with blood. Allow one hour for this exam. There are no restrictions or special instructions.

## 2010-04-16 NOTE — Assessment & Plan Note (Signed)
Summary: 6wk f/u sl   Primary Provider:  Dr Ursula Beath  CC:  6 week follow up. Pt concerned about fatigue and weight gain.Caitlyn [name]  History of Present Illness: The patient is 60 years old and return for management of CAD.  in April 2011 she had a non-ST elevation MI and was treated with 2 drug stents to the right coronary artery by Dr. Riley Kill. She was discharged home and since that time has been participating in the cardiac rehabilitation program. She has had no chest pain or shortness of breath but she does say she been quite fatigued. She also indicated that her pulse rates have been slow in the 40s.  Review of systems is positive for some symptoms of depression.  Other problems include hypertension, hyperlipidemia, obesity, and carotid disease. 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 Check carotid studies recently which showed 60-80% narrowing on the right and 0-40% narrowing on the left.  She had been working 2 jobs but she's not working in school this summer but still is working at the [employer].  Current Medications (verified): 1)  Aspirin Ec 325 Mg Tbec (Aspirin) .... Take One Tablet By Mouth Daily 2)  Amlodipine Besylate 10 Mg Tabs (Amlodipine Besylate) .... Take One Tablet By Mouth Daily 3)  Plavix 75 Mg Tabs (Clopidogrel Bisulfate) .Caitlyn [name].. 1 Tab Qd 4)  Accupril 20 Mg Tabs (Quinapril Hcl) .Caitlyn [name].. 1 Tab Once Daily 5)  Nitrostat 0.4 Mg Subl (Nitroglycerin) .Caitlyn [name].. 1 Tablet Under Tongue At Onset of Chest Pain; You May Repeat Every 5 Minutes For Up To 3 Doses. 6)  Synthroid 100 Mcg Tabs (Levothyroxine Sodium) .Caitlyn [name].. 1 Tab Once Daily 7)  Drisdol 81191 Unit Caps (Ergocalciferol) .Caitlyn [name].. 1 Capsule On Mon-And-Thursday 8)  Metoprolol Succinate 25 Mg Xr24h-Tab (Metoprolol Succinate) .... Take One-Half Tablet By Mouth Daily 9)  Crestor 40 Mg Tabs (Rosuvastatin Calcium) .Caitlyn [name].. 1 Tab Once Daily  Allergies (verified): No Known Drug Allergies  Past History:  Past Medical  History: Reviewed history from 07/23/2009 and no changes required. Current Problems:  GRAVES' DISEASE, HX OF (ICD-V12.2) PVD (ICD-443.9) HYPOTHYROIDISM (ICD-244.9) TOBACCO ABUSE (ICD-305.1) HYPERLIPIDEMIA (ICD-272.4) HYPERTENSION (ICD-401.9) CAD (ICD-414.00)  Review of Systems       ROS is negative except as outlined in HPI.   Vital Signs:  Patient profile:   60 year old female Height:      66 inches Weight:      233 pounds BMI:     37.74 Pulse rate:   64 / minute Pulse rhythm:   regular BP sitting:   113 / 72  (left arm) Cuff size:   large  Vitals Entered By: Burnett Kanaris, CNA (August 31, 2009 3:18 PM)  Physical Exam  Additional Exam:  Gen. Well-nourished, in no distress   Neck: No JVD, thyroid not enlarged, no carotid bruits Lungs: No tachypnea, clear without rales, rhonchi or wheezes Cardiovascular: Rhythm regular, PMI not displaced,  heart sounds  normal, no murmurs or gallops, no peripheral edema, pulses normal in all 4 extremities. Abdomen: BS normal, abdomen soft and non-tender without masses or organomegaly, no hepatosplenomegaly. MS: No deformities, no cyanosis or clubbing   Neuro:  No focal sns   Skin:  no lesions    Impression & Recommendations:  Problem # 1:  CAD (ICD-414.00)  She had a non-ST elevation MI in April 2011 treated with 2 drug stents to the RCA. She's had no recurrent chest pain and his palm appears stable.  The following medications  were removed from the medication list:    Metoprolol Succinate 25 Mg Xr24h-tab (Metoprolol succinate) .Caitlyn [name]... Take one-half tablet by mouth daily Her updated medication list for this problem includes:    Aspirin Ec 325 Mg Tbec (Aspirin) .Caitlyn [name]... Take one tablet by mouth daily    Amlodipine Besylate 10 Mg Tabs (Amlodipine besylate) .Caitlyn [name]... Take one tablet by mouth daily    Plavix 75 Mg Tabs (Clopidogrel bisulfate) .Caitlyn [name]... 1 tab qd    Accupril 20 Mg Tabs (Quinapril hcl) .Caitlyn [name]... 1 tab once daily    Nitrostat 0.4 Mg Subl  (Nitroglycerin) .Caitlyn [name]... 1 tablet under tongue at onset of chest pain; you may repeat every 5 minutes for up to 3 doses.  Orders: EKG w/ Interpretation (93000)  Problem # 2:  HYPERLIPIDEMIA (ICD-272.4) She had a good lipid profile on her current medications. Her updated medication list for this problem includes:    Crestor 40 Mg Tabs (Rosuvastatin calcium) .Caitlyn [name]... 1 tab once daily  Problem # 3:  FATIGUE / MALAISE (ICD-780.79) She has symptoms of fatigue and tiredness. Her pulse rates have been low and her beta blocker may be contributing to this. She is on a very low dose of Toprol and we'll stop that. Some of her symptoms also may be from withdrawing from cigarettes. She also has had some symptoms of depression and she plans to see her primary care physician about this.  Problem # 4:  CAROTID STENOSIS (ICD-433.10) She has had previous vertebral stenting with occlusion of that vessel. A recent carotid study showed 60-80% stenosis on the right and 40% stenosis on the left. She is asymptomatic and we'll follow this. Her updated medication list for this problem includes:    Aspirin Ec 325 Mg Tbec (Aspirin) .Caitlyn [name]... Take one tablet by mouth daily    Plavix 75 Mg Tabs (Clopidogrel bisulfate) .Caitlyn [name]... 1 tab qd  Patient Instructions: 1)  Your physician recommends that you schedule a follow-up appointment in: 4 months with Dr. Juanda Chance 2)  Your physician has recommended you make the following change in your medication: STOP metoprolol.

## 2010-04-16 NOTE — Progress Notes (Signed)
Summary: Cardiology Phone Note - Bradycardia  Phone Note From Other Clinic   Caller: Nurse Summary of Call: Received call from Byrd Hesselbach, RN w/ [facility] stating that Ms. Figler's HR has been running in the mid 40's post exercise.  I spoke to Ms Lentz and she denies presyncope but does report fatigue.  I've called in a Rx for Toprol XL 25mg  1/2 tab by mouth daily, #30 w/ 3 refills to her [facility] on [address] in [place].  If she cont. to have fatigue on this very low dose, we will have to d/c Bb all together.  Pt. verbalized understanding. Initial call taken by: Creig Hines, ANP-BC,  August 17, 2009 5:04 PM

## 2010-04-16 NOTE — Progress Notes (Signed)
Summary: paperwork filled out  Phone Note Call from Patient Call back at Home Phone [phone]   Caller: Patient Reason for Call: Talk to Nurse Details for Reason: pls clarify form regarding who filled out paper work.  Initial call taken by: Lorne Skeens,  Jul 30, 2009 3:18 PM  Follow-up for Phone Call        I called and spoke with the pt regarding her forms. She had questions about some of the information on here and I have discussed this with her. Follow-up by: Sherri Rad, RN, BSN,  Jul 30, 2009 4:06 PM

## 2010-04-16 NOTE — Letter (Signed)
Summary: Return To Work  [employer], Main Office  [address]   [place], [place] [address]   Phone: [phone]  Fax: [phone]    07/24/2009  TO: WHOM IT MAY CONCERN   RE: Caitlyn [name] [address] [place]   The above named individual is under my medical care and may return to work on: tHURSDAY Jul 26, 2009  If you have any further questions or need additional information, please call.     Sincerely,    Caitlyn Devoid RN/ DR. Charlies Constable

## 2010-04-16 NOTE — Progress Notes (Signed)
Summary: nuc pre procedure  Phone Note Outgoing Call Call back at Home Phone [phone]   Call placed by: Lady Saucier Call placed to: Patient Reason for Call: Confirm/change Appt Summary of Call: Left message with information on Myoview Information Sheet (see scanned document for details).      Nuclear Med Background Indications for Stress Test: Evaluation for Ischemia, Post Hospital   History: Heart Catheterization, Myocardial Infarction, Stents  History Comments: 4/11 MI-NSTEMI 5/11Cath 90%RCA,Mod residual CAD and stent x2  RCA.  Symptoms: Chest Pain, Fatigue    Nuclear Pre-Procedure Cardiac Risk Factors: Carotid Disease, Family History - CAD, History of Smoking, Hypertension, Lipids Height (in): 66

## 2010-04-16 NOTE — Progress Notes (Signed)
Summary: Med change  Phone Note Outgoing Call   Summary of Call: Form received from cardiac rehab stating that the pt's HR was 47 on 6/3 post exercise. Solon Palm was contacted by CR and he decreased the pt's metoprolol to 12.5mg  once daily. Initial call taken by: Sherri Rad, RN, BSN,  August 23, 2009 5:33 PM    New/Updated Medications: METOPROLOL SUCCINATE 25 MG XR24H-TAB (METOPROLOL SUCCINATE) Take one-half tablet by mouth daily

## 2010-04-16 NOTE — Progress Notes (Signed)
Summary: pt wants myoview results  Phone Note Call from Patient Call back at Home Phone [phone]   Caller: Patient Reason for Call: Talk to Nurse, Talk to Doctor, Lab or Test Results Summary of Call: pt wants results of myoview  Initial call taken by: Omer Jack,  November 23, 2009 9:54 AM  Follow-up for Phone Call        Called pt.  Follow-up by: Sherri Rad, RN, BSN,  November 23, 2009 12:51 PM

## 2010-04-16 NOTE — Miscellaneous (Signed)
Summary: [facility] Order/Treatment Plan   [facility] Order/Treatment Plan   Imported By: Roderic Ovens 08/22/2009 16:06:32  _____________________________________________________________________  External Attachment:    Type:   Image     Comment:   External Document

## 2010-04-16 NOTE — Letter (Signed)
Summary: Return To Work  [employer], Main Office  [address]   [place], [place] [address]   Phone: [phone]  Fax: [phone]    07/24/2009  TO: WHOM IT MAY CONCERN   RE: Caitlyn [name] [address] [place]   The above named individual is under my medical care and may return to work [id number] May 12,2011 without restrictions.  If you have any further questions or need additional information, please call.     Sincerely,    Lisabeth Devoid RN/ Dr. Charlies Constable

## 2010-04-16 NOTE — Assessment & Plan Note (Signed)
Summary: eph   Visit Type:  Follow-up Primary Provider:  Dr Ursula Beath  CC:  pt continues to have discomfort in her chest area.Pt has leg pain.  History of Present Illness: Caitlyn [name] is 60 yrs old and had a nstemi 4/11 Rx 2 DES to RCA.  Recently hosp with cp r/o fo rmi.  Had op MV which was neg for ischemia.  She still had chest discomfort at rest and with exertion.  Also leg muscular pain.  PH sig for Htn, HL, carotid stenosis  Current Medications (verified): 1)  Aspirin Ec 325 Mg Tbec (Aspirin) .... Take One Tablet By Mouth Daily 2)  Amlodipine Besylate 10 Mg Tabs (Amlodipine Besylate) .... Take One Tablet By Mouth Daily 3)  Plavix 75 Mg Tabs (Clopidogrel Bisulfate) .Marland Kitchen.. 1 Tab Qd 4)  Accupril 20 Mg Tabs (Quinapril Hcl) .Marland Kitchen.. 1 Tab Once Daily 5)  Nitrostat 0.4 Mg Subl (Nitroglycerin) .Marland Kitchen.. 1 Tablet Under Tongue At Onset of Chest Pain; You May Repeat Every 5 Minutes For Up To 3 Doses. 6)  Synthroid 100 Mcg Tabs (Levothyroxine Sodium) .Marland Kitchen.. 1 Tab Once Daily 7)  Drisdol [address] Unit Caps (Ergocalciferol) .Marland Kitchen.. 1 Capsule On Mon-And-Thursday(Vit D-2)--Out of Med 8)  Crestor 40 Mg Tabs (Rosuvastatin Calcium) .Marland Kitchen.. 1 Tab Once Daily 9)  Colcrys 0.6 Mg Tabs (Colchicine) .Marland Kitchen.. 1 Tab As Needed 10)  Zolpidem Tartrate 5 Mg Tabs (Zolpidem Tartrate) .Marland Kitchen.. 1 Tab Qhs  Allergies (verified): No Known Drug Allergies  Past History:  Past Medical History: Reviewed history from 07/23/2009 and no changes required. Current Problems:  GRAVES' DISEASE, HX OF (ICD-V12.2) PVD (ICD-443.9) HYPOTHYROIDISM (ICD-244.9) TOBACCO ABUSE (ICD-305.1) HYPERLIPIDEMIA (ICD-272.4) HYPERTENSION (ICD-401.9) CAD (ICD-414.00)  Review of Systems       ROS is negative except as outlined in HPI.   Vital Signs:  Patient profile:   60 year old female Height:      67 inches Weight:      238 pounds BMI:     37.41 Pulse rate:   65 / minute BP sitting:   144 / 78  (left arm) Cuff size:   large  Vitals Entered By:  Burnett Kanaris, CNA (December 11, 2009 3:39 PM)  Physical Exam  Additional Exam:  Gen. Well-nourished, in no distress   Neck: No JVD, thyroid not enlarged, no carotid bruits Lungs: No tachypnea, clear without rales, rhonchi or wheezes Cardiovascular: Rhythm regular, PMI not displaced,  heart sounds  normal, no murmurs or gallops, no peripheral edema, pulses normal in all 4 extremities. Abdomen: BS normal, abdomen soft and non-tender without masses or organomegaly, no hepatosplenomegaly. MS: No deformities, no cyanosis or clubbing   Neuro:  No focal sns   Skin:  no lesions    Impression & Recommendations:  Problem # 1:  CAD (ICD-414.00) She had prior nstemi and DES to RCA x 2.  Now still discomfort with neg MV.  This may be muscular. No major symptoms reflux. Her updated medication list for this problem includes:    Aspirin Ec 325 Mg Tbec (Aspirin) .Marland Kitchen... Take one tablet by mouth daily    Amlodipine Besylate 10 Mg Tabs (Amlodipine besylate) .Marland Kitchen... Take one tablet by mouth daily    Plavix 75 Mg Tabs (Clopidogrel bisulfate) .Marland Kitchen... 1 tab qd    Accupril 20 Mg Tabs (Quinapril hcl) .Marland Kitchen... 1 tab once daily    Nitrostat 0.4 Mg Subl (Nitroglycerin) .Marland Kitchen... 1 tablet under tongue at onset of chest pain; you may repeat every 5 minutes for up to 3  doses.  Her updated medication list for this problem includes:    Aspirin Ec 325 Mg Tbec (Aspirin) .Marland Kitchen... Take one tablet by mouth daily    Amlodipine Besylate 10 Mg Tabs (Amlodipine besylate) .Marland Kitchen... Take one tablet by mouth daily    Plavix 75 Mg Tabs (Clopidogrel bisulfate) .Marland Kitchen... 1 tab qd    Accupril 20 Mg Tabs (Quinapril hcl) .Marland Kitchen... 1 tab once daily    Nitrostat 0.4 Mg Subl (Nitroglycerin) .Marland Kitchen... 1 tablet under tongue at onset of chest pain; you may repeat every 5 minutes for up to 3 doses.  Problem # 2:  HYPERLIPIDEMIA (ICD-272.4) She has muscle aches which could be from Crestor.  Will hold for 2 wks and have her call if she is better or not. Her updated  medication list for this problem includes:    Crestor 40 Mg Tabs (Rosuvastatin calcium) .Marland Kitchen... 1 tab once daily  Problem # 3:  HYPERTENSION (ICD-401.9) BP up today.  Will increase accupril to 30 mg/day.  BMP 1 wk. Her updated medication list for this problem includes:    Aspirin Ec 325 Mg Tbec (Aspirin) .Marland Kitchen... Take one tablet by mouth daily    Amlodipine Besylate 10 Mg Tabs (Amlodipine besylate) .Marland Kitchen... Take one tablet by mouth daily    Accupril 20 Mg Tabs (Quinapril hcl) .Marland Kitchen... 1 & 1/2  tablets by mouth once daily  Her updated medication list for this problem includes:    Aspirin Ec 325 Mg Tbec (Aspirin) .Marland Kitchen... Take one tablet by mouth daily    Amlodipine Besylate 10 Mg Tabs (Amlodipine besylate) .Marland Kitchen... Take one tablet by mouth daily    Accupril 20 Mg Tabs (Quinapril hcl) .Marland Kitchen... 1 & 1/2  tablets by mouth once daily  Patient Instructions: 1)  Increase Accupril to 20mg  1 & 1/2 tablets once daily. 2)  Hold Crestor for 2 weeks to see if you feel any better. 3)  Your physician recommends that you schedule a follow-up appointment in: 3 months.

## 2010-04-16 NOTE — Progress Notes (Signed)
Summary: FMLA paperwork  Received FMLA paperwork. Forwarded to [employer] for processing.Rene Kocher Flowers  Jul 17, 2009 10:42 AM

## 2010-04-16 NOTE — Cardiovascular Report (Signed)
Summary: Cardiac Cath Report  Cardiac Cath Report   Imported By: Roderic Ovens 08/01/2009 10:34:42  _____________________________________________________________________  External Attachment:    Type:   Image     Comment:   External Document

## 2010-04-16 NOTE — Miscellaneous (Signed)
Summary: [facility] Note   [facility] Progress Note   Imported By: Roderic Ovens 09/18/2009 13:04:04  _____________________________________________________________________  External Attachment:    Type:   Image     Comment:   External Document

## 2010-04-16 NOTE — Progress Notes (Signed)
  Walk in Patient Form Recieved " Pt dropped off paper to be completed from Tobacco Cessation and Weight Management" sent to [place] Nurse  [facility]  August 27, 2009 11:52 AM

## 2010-04-17 ENCOUNTER — Telehealth: Payer: Self-pay | Admitting: Cardiovascular Disease

## 2010-04-18 ENCOUNTER — Telehealth: Payer: Self-pay | Admitting: Cardiovascular Disease

## 2010-04-18 NOTE — Letter (Signed)
Summary: Family Medicine @ [facility]  [facility] @ [facility]   Imported By: Sherian Rein 03/19/2010 12:33:11  _____________________________________________________________________  External Attachment:    Type:   Image     Comment:   External Document

## 2010-04-18 NOTE — Assessment & Plan Note (Signed)
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  Recently hosp with cp r/o fo rmi.  Had op MV which was neg for ischemia.  She has been doing fairly well for the standpoint of her heart without any angina or palpitations or shortness of breath. She does say she has symptoms of fatigue.  She also has carotid disease and had 60-80% stenosis by Doppler's on the right side 6 months ago.  She is now working 2 jobs. 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 She is also taking courses in management.  Current Medications (verified): 1)  Aspirin Ec 325 Mg Tbec (Aspirin) .... Take One Tablet By Mouth Daily 2)  Amlodipine Besylate 10 Mg Tabs (Amlodipine Besylate) .... Take One Tablet By Mouth Daily 3)  Plavix 75 Mg Tabs (Clopidogrel Bisulfate) .Marland Kitchen.. 1 Tab Qd 4)  Accupril 20 Mg Tabs (Quinapril Hcl) .... Take One Tablet By Mouth Once Daily Along With A 10mg  Accupril Tablet Once Daily 5)  Nitrostat 0.4 Mg Subl (Nitroglycerin) .Marland Kitchen.. 1 Tablet Under Tongue At Onset of Chest Pain; You May Repeat Every 5 Minutes For Up To 3 Doses. 6)  Levothroid 112 Mcg Tabs (Levothyroxine Sodium) .... Once Daily 7)  Drisdol 16109 Unit Caps (Ergocalciferol) .Marland Kitchen.. 1 Capsule On Mon-And-Thursday(Vit D-2)--Out of Med 8)  Colcrys 0.6 Mg Tabs (Colchicine) .Marland Kitchen.. 1 Tab As Needed 9)  Zolpidem Tartrate 5 Mg Tabs (Zolpidem Tartrate) .Marland Kitchen.. 1 Tab Qhs 10)  Accupril 10 Mg Tabs (Quinapril Hcl) .... Take One Tablet By Mouth Once Daily Along With Accupril 20mg  Once Daily.  Allergies (verified): No Known Drug Allergies  Past History:  Past Medical History: Reviewed history from 07/23/2009 and no changes required. 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   Vital Signs:  Patient profile:   60 year old female Height:      67 inches Weight:      237 pounds BMI:     37.25 Pulse rate:   72 / minute BP sitting:   138 / 80  (left arm)  Vitals Entered By: Laurance Flatten CMA (March 07, 2010 8:26 AM)  Physical Exam  Additional Exam:  Gen. Well-nourished, in no distress   Neck: No JVD, thyroid not enlarged, bilateral carotid bruits Lungs: No tachypnea, clear without rales, rhonchi or wheezes Cardiovascular: Rhythm regular, PMI not displaced,  heart sounds  normal, no murmurs or gallops, no peripheral edema, pulses normal in all 4 extremities. Abdomen: BS normal, abdomen soft and non-tender without masses or organomegaly, no hepatosplenomegaly. MS: No deformities, no cyanosis or clubbing   Neuro:  No focal sns   Skin:  no lesions    Impression & Recommendations:  Problem # 1:  CAD (ICD-414.00)  She had a non-ST elevation MI in April of 2011 with 2 drug-eluting stents to the right coronary artery. She's had no angina and this prompted her stable.  Her updated medication list for this problem includes:    Aspirin Ec 325 Mg Tbec (Aspirin) .Marland Kitchen... Take one tablet by mouth daily    Amlodipine Besylate 10 Mg Tabs (Amlodipine besylate) .Marland Kitchen... Take one tablet by  mouth daily    Plavix 75 Mg Tabs (Clopidogrel bisulfate) .Marland Kitchen... 1 tab qd    Accupril 20 Mg Tabs (Quinapril hcl) .Marland Kitchen... Take one tablet by mouth once daily along with a 10mg  accupril tablet once daily    Nitrostat 0.4 Mg Subl (Nitroglycerin) .Marland Kitchen... 1 tablet under tongue at onset of chest pain; you may repeat every 5 minutes for up to 3 doses.    Accupril 10 Mg Tabs (Quinapril hcl) .Marland Kitchen... Take one tablet by mouth once daily along with accupril 20mg  once daily.  Orders: EKG w/ Interpretation (93000)  Problem # 2:  HYPERTENSION (ICD-401.9) This is well-controlled on current medications. Her  updated medication list for this problem includes:    Aspirin Ec 325 Mg Tbec (Aspirin) .Marland Kitchen... Take one tablet by mouth daily    Amlodipine Besylate 10 Mg Tabs (Amlodipine besylate) .Marland Kitchen... Take one tablet by mouth daily    Accupril 20 Mg Tabs (Quinapril hcl) .Marland Kitchen... Take one tablet by mouth once daily along with a 10mg  accupril tablet once daily    Accupril 10 Mg Tabs (Quinapril hcl) .Marland Kitchen... Take one tablet by mouth once daily along with accupril 20mg  once daily.  Problem # 3:  HYPERLIPIDEMIA (ICD-272.4) She had muscle aches on Crestor and stopped this and is currently not on any statin. Her primary care physician started her on WelChol but she was not able to tolerate this due to constipation. She is willing to try pravastatin and will start her on pravastatin 20 mg. The following medications were removed from the medication list:    Crestor 40 Mg Tabs (Rosuvastatin calcium) .Marland Kitchen... 1 tab once daily Her updated medication list for this problem includes:    Pravastatin Sodium 20 Mg Tabs (Pravastatin sodium) .Marland Kitchen... Take one tablet by mouth daily at bedtime  The following medications were removed from the medication list:    Crestor 40 Mg Tabs (Rosuvastatin calcium) .Marland Kitchen... 1 tab once daily  Problem # 4:  FATIGUE / MALAISE (ICD-780.79)  She is having generalized symptoms of fatigue and malaise. She does not currently have a primary care physician and asked if we could help her find one in her group and we'll plan to do that.  Orders: Primary Care Referral (Primary)  Problem # 5:  CAROTID STENOSIS (ICD-433.10)  Shas bilateral carotid bruits and has 60-80% stenosis on the right side by duplex in June. We will repeat her carotid studies. Her updated medication list for this problem includes:    Aspirin Ec 325 Mg Tbec (Aspirin) .Marland Kitchen... Take one tablet by mouth daily    Plavix 75 Mg Tabs (Clopidogrel bisulfate) .Marland Kitchen... 1 tab qd  Orders: Carotid Duplex (Carotid Duplex)  Patient Instructions: 1)  Start  Pravastatin 20mg  one tab by mouth once daily. 2)  Start Ambien 5mg  one tab by mouth at bedtime as needed for sleep. 3)  You will need to return for FASTING labwork in 6 weeks: lipid/liver (414.01;272.2). 4)  Your physician has requested that you have a carotid duplex. This test is an ultrasound of the carotid arteries in your neck. It looks at blood flow through these arteries that supply the brain with blood. Allow one hour for this exam. There are no restrictions or special instructions. 5)  We will refer you Dr. Azalia Bilis in primary care to establish. 6)  Your physician wants you to follow-up in: 6 months with Dr. Clifton James.  You will receive a reminder letter in the mail two months in advance. If you don't receive a  letter, please call our office to schedule the follow-up appointment. Prescriptions: PRAVASTATIN SODIUM 20 MG TABS (PRAVASTATIN SODIUM) Take one tablet by mouth daily at bedtime  #30 x 1   Entered by:   Sherri Rad, RN, BSN   Authorized by:   Lenoria Farrier, MD, [facility]   Signed by:   Sherri Rad, RN, BSN on 03/07/2010   Method used:   Print then Give to Patient   RxID:   [id number] ZOLPIDEM TARTRATE 5 MG TABS (ZOLPIDEM TARTRATE) 1 tab qhs  #30 x 0   Entered by:   Sherri Rad, RN, BSN   Authorized by:   Lenoria Farrier, MD, [facility]   Signed by:   Sherri Rad, RN, BSN on 03/07/2010   Method used:   Print then Give to Patient   RxID:   [id number] PRAVASTATIN SODIUM 20 MG TABS (PRAVASTATIN SODIUM) Take one tablet by mouth daily at bedtime  #90 x 3   Entered by:   Sherri Rad, RN, BSN   Authorized by:   Lenoria Farrier, MD, [facility]   Signed by:   Sherri Rad, RN, BSN on 03/07/2010   Method used:   Print then Give to Patient   RxID:   [id number] LEVOTHROID 112 MCG TABS (LEVOTHYROXINE SODIUM) once daily  #90 x 3   Entered by:   Sherri Rad, RN, BSN   Authorized by:   Lenoria Farrier, MD, [facility]   Signed by:   Sherri Rad, RN,  BSN on 03/07/2010   Method used:   Print then Give to Patient   RxID:   [id number]

## 2010-04-18 NOTE — Progress Notes (Signed)
Summary: VAL pt?  Phone Note Call from Patient Call back at [facility] Phone [phone]   Caller: Patient Summary of Call: Pt called stating sore on tongue, APHTHOUS ULCERS is back. Pt is requesting advisement from MD. Initial call taken by: Margaret Pyle, CMA,  March 28, 2010 2:16 PM  Follow-up for Phone Call        no specific treatment HAS to be done as this is self-limiting, but if she wants , we can try Kenalog in orabase paste Follow-up by: Corwin Levins MD,  March 28, 2010 2:54 PM  Additional Follow-up for Phone Call Additional follow up Details #1::        Pt advised of above and will like to try Kenolog paste. [facility] [place] Additional Follow-up by: Margaret Pyle, CMA,  March 28, 2010 3:08 PM    Additional Follow-up for Phone Call Additional follow up Details #2::    ok  - will do  - done hardcopy to LIM side B - dahlia  Follow-up by: Corwin Levins MD,  March 28, 2010 3:45 PM  Additional Follow-up for Phone Call Additional follow up Details #3:: Details for Additional Follow-up Action Taken: Rx faxed to pharmacy Additional Follow-up by: Margaret Pyle, CMA,  March 28, 2010 4:16 PM  New/Updated Medications: * KENALOG IN ORABASE use asd three times a day as needed to affected area Prescriptions: KENALOG IN ORABASE use asd three times a day as needed to affected area  #1 large x 1   Entered and Authorized by:   Corwin Levins MD   Signed by:   Corwin Levins MD on 03/28/2010   Method used:   Print then Give to Patient   RxID:   [id number]

## 2010-04-18 NOTE — Progress Notes (Signed)
Summary: CALL  Phone Note Call from Patient Call back at [facility] Phone [phone]   Summary of Call: Pt left vm but it was unclear as to what her concern was, she is req a call back after 3:30 today.  Initial call taken by: Lamar Sprinkles, CMA,  April 10, 2010 1:59 PM  Follow-up for Phone Call        Pt called stating she has an infection near or around her vaginal area and she was her GYN for it and was Rx'd a cream and pills. Pt says she is out and infection has returned. Pt was advised to contact her GYN for further advisement. Follow-up by: Margaret Pyle, CMA,  April 10, 2010 4:19 PM

## 2010-04-18 NOTE — Progress Notes (Signed)
Summary: bleeding ulcer?  Phone Note Call from Patient Call back at Home Phone [phone]   Caller: Mom Summary of Call: Pt called stating ulcer on her tongue started bleeding yesterday and has been since. Pt says it is mild but continuous. Pt is requesting MD advisement until she is able to get appt with oral surgeon. Initial call taken by: Margaret Pyle, CMA,  April 01, 2010 10:17 AM  Follow-up for Phone Call        if holding pressure for 3 minutes has not stopped bleeding, should go to er or call her dentist for eval; also can try occluding ulcer with the traimcinolne paste (if not already done) - being on plavix+asa makes the bleeding difficult to control Follow-up by: Newt Lukes MD,  April 01, 2010 12:31 PM  Additional Follow-up for Phone Call Additional follow up Details #1::        Pt informed and will contact dentist or UC if needed. Pt says she has been applying paste directly to ulcer which has helped with bleeding (has now stopped) Additional Follow-up by: Margaret Pyle, CMA,  April 01, 2010 1:37 PM

## 2010-04-18 NOTE — Assessment & Plan Note (Signed)
Summary: NEW/ BCBC/ OK TO WORK IN PER FLAG/NWS  #   Vital Signs:  Patient profile:   60 year old female Height:      67 inches (170.18 cm) Weight:      240.12 pounds (109.15 kg) O2 Sat:      96 % on Room air Temp:     98.4 degrees F (36.89 degrees C) oral Pulse rate:   60 / minute BP sitting:   138 / 72  (left arm) Cuff size:   large  Vitals Entered By: Orlan Leavens RMA (March 13, 2010 1:11 PM)  O2 Flow:  Room air CC: New patient Is Patient Diabetic? No Pain Assessment Patient in pain? no        Primary Care Tamirah George:  Newt Lukes MD  CC:  New patient.  History of Present Illness: new pt to me and our division but known to our card group - here to est care  1) CAD - MI 06/2009 with 2 DES to RCA - no anginal symptoms - follows regularly with cards for same - reports compliance with ongoing medical treatment and no changes in medication dose or frequency. denies adverse side effects related to current therapy.  - cont vague SSCP nonexertional, not positional with repeat card eval x 2 since stents 8 months ago - no cardaica abn found   2) dyslipidemia - intol of crestor due to myalgia (improved off med) - tried on welchol but poor tol due to constipation - on prav at urging if cards 01/2010 OV  3) hypothyroid - hx graves - reports compliance with ongoing medical treatment and no changes in medication dose or frequency. denies adverse side effects related to current therapy. no skin or bowel changes  4) HTN-reports compliance with ongoing medical treatment and no changes in medication dose or frequency. denies adverse side effects related to current therapy.   Preventive Screening-Counseling & Management  Alcohol-Tobacco     Alcohol drinks/day: 0     Smoking Status: quit     Year Quit: 06/2009     Tobacco Counseling: not to resume use of tobacco products  Caffeine-Diet-Exercise     Diet Counseling: to improve diet; diet is suboptimal     Does Patient Exercise:  no     Exercise Counseling: to improve exercise regimen     Depression Counseling: not indicated; screening negative for depression  Safety-Violence-Falls     Seat Belt Counseling: not indicated; patient wears seat belts     Helmet Counseling: not indicated; patient wears helmet when riding bicycle/motocycle     Violence Counseling: not applicable     Fall Risk Counseling: not indicated; no significant falls noted  Clinical Review Panels:  Prevention   Last Mammogram:  [facility].   No specific mammographic evidence of malignancy.  Assessment: BIRADS 1. (05/11/2009)   Last Pap Smear:  Interpretation Result:Negative for intraepithelial Lesion or Malignancy.    (05/04/2009)  Lipid Management   Cholesterol:  232 (02/04/2010)   LDL (bad choesterol):  151 (02/04/2010)   HDL (good cholesterol):  58 (02/04/2010)   Triglycerides:  114 (02/04/2010)  CBC   WBC:  9.2 (05/04/2009)   RBC:  4.72 (05/04/2009)   Hgb:  14.8 (05/04/2009)   Hct:  43.5 (05/04/2009)   Platelets:  271 (05/04/2009)   MCV  92 (05/04/2009)   RDW  13.7 (05/04/2009)   PMN:  68 (05/04/2009)  Complete Metabolic Panel   Glucose:  74 (02/04/2010)  Sodium:  140 (02/04/2010)   Potassium:  4.1 (05/04/2009)   Chloride:  104 (05/04/2009)   CO2:  22 (05/04/2009)   BUN:  17 (02/04/2010)   Creatinine:  0.87 (02/04/2010)   Albumin:  4.1 (02/04/2010)   Total Protein:  7.3 (02/04/2010)   Calcium:  9.2 (02/04/2010)   Total Bili:  0.4 (08/27/2009)   Alk Phos:  86 (02/04/2010)   SGPT (ALT):  14 (08/27/2009)   SGOT (AST):  19 (08/27/2009)   -  Date:  02/04/2010    BG Random: 74    BUN: 17    Creatinine: 0.87    Sodium: 140    Alk Phos: 86    Calcium: 9.2    Total Protein: 7.3    Albumin: 4.1    Cholesterol: 232    LDL: 151    HDL: 58    Triglycerides: 272    TSH: 1.690  Date:  11/04/2009    TSH: 5.080  Date:  05/04/2009    BG Random: 80    BUN: 13    Creatinine: 0.81     Sodium: 140    Calcium: 9.4    Triglycerides: 104    TSH: 1.790    Potassium: 4.1    Chloride: 104    CO2 Total: 22    WBC: 9.2    HGB: 14.8    HCT: 43.5    RBC: 4.72    PLT: 271    MCV: 92    RDW: 13.7    Neutrophil: 68    Lymphs: 26  Current Medications (verified): 1)  Aspirin Ec 325 Mg Tbec (Aspirin) .... Take One Tablet By Mouth Daily 2)  Amlodipine Besylate 10 Mg Tabs (Amlodipine Besylate) .... Take One Tablet By Mouth Daily 3)  Plavix 75 Mg Tabs (Clopidogrel Bisulfate) .Marland Kitchen.. 1 Tab Qd 4)  Accupril 20 Mg Tabs (Quinapril Hcl) .... Take One Tablet By Mouth Once Daily Along With A 10mg  Accupril Tablet Once Daily 5)  Nitrostat 0.4 Mg Subl (Nitroglycerin) .Marland Kitchen.. 1 Tablet Under Tongue At Onset of Chest Pain; You May Repeat Every 5 Minutes For Up To 3 Doses. 6)  Levothroid 112 Mcg Tabs (Levothyroxine Sodium) .... Once Daily 7)  Drisdol 53664 Unit Caps (Ergocalciferol) .Marland Kitchen.. 1 Capsule On Mon-And-Thursday(Vit D-2)--Out of Med 8)  Colcrys 0.6 Mg Tabs (Colchicine) .Marland Kitchen.. 1 Tab As Needed 9)  Zolpidem Tartrate 5 Mg Tabs (Zolpidem Tartrate) .Marland Kitchen.. 1 Tab Qhs 10)  Accupril 10 Mg Tabs (Quinapril Hcl) .... Take One Tablet By Mouth Once Daily Along With Accupril 20mg  Once Daily. 11)  Pravastatin Sodium 20 Mg Tabs (Pravastatin Sodium) .... Take One Tablet By Mouth Daily At Bedtime  Allergies (verified): No Known Drug Allergies  Past History:  Past Medical History: GRAVES' DISEASE, HX -     now hypothyroid PVD - L carotid stent,  R ICA 60% 2011US TOBACCO ABUSE hx = quit 06/2009 HYPERLIPIDEMIA  HYPERTENSION  CAD - MI 06/2009 - 2 DES to RCA Vit D defic - dexa 05/11/09 - normal   MD roster: card Juanda Chance  Past Surgical History: Bilateral carotid artery disease status post stenting of left carotid.   Graves' disease status post radioactive treatment complicated by hypothyroidism.   Family History: Positive for coronary artery disease and high blood pressure  Social History: quit smoking  06/30/09 after MI - prev 1ppd x 27yr -  Drinks alcohol  occasionally.   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  divorced, single, lives alone - 2 grown dtr nearby, 3 g-kids   Smoking Status:  quit Does Patient Exercise:  no  Review of Systems       c/o burnng sore on right side of tongue tip x 1 week - denies bte or precipitating trauma - no hx same; otherwise, see HPI above. I have reviewed all other systems and they were negative.   Physical Exam  General:  alert, well-developed, well-nourished, and cooperative to examination.   overweight-appearing.   Eyes:  vision grossly intact, pupils equal round and reactive, and proptosis.   Ears:  R ear normal and L ear normal.   Mouth:  teeth and gums in good repair; mucous membranes moist, 6mm round superficial ulceration on right lateral edge of tongue tip - no other lesions or ulcers. oropharynx clear without exudate or erythema.  Neck:  thick, supple, full ROM, no masses, no thyromegaly; no thyroid nodules or tenderness. no JVD or carotid bruits.   Lungs:  normal respiratory effort, no intercostal retractions or use of accessory muscles; normal breath sounds bilaterally - no crackles and no wheezes.    Heart:  normal rate, regular rhythm, no murmur, and no rub. BLE without edema. normal DP pulses and normal cap refill in all 4 extremities    Abdomen:  obese, soft, non-tender, normal bowel sounds, no distention; no masses and no appreciable hepatomegaly or splenomegaly.   Genitalia:  defer Msk:  No deformity or scoliosis noted of thoracic or lumbar spine.   Neurologic:  alert & oriented X3 and cranial nerves II-XII symetrically intact.  strength normal in all extremities, sensation intact to light touch, and gait normal. speech fluent without dysarthria or aphasia; follows commands with good comprehension.  Skin:  no rashes, vesicles, ulcers, or erythema.  No nodules or irregularity to palpation.  Psych:  Oriented X3, memory intact for recent and remote, normally interactive, good eye contact, not anxious appearing, not depressed appearing, and not agitated.      Impression & Recommendations:  Problem # 1:  FATIGUE / MALAISE (ICD-780.79) generalized symptoms onfgoing since 06/2009 MI and stents - cardiac repeat eval without evidence for persisting cardiac cause - neg myoview 10/2009 reviewed ion EMR and with pt today - reassurance suspect anxiety is contrib to symptoms -  prior records from PCP brought with pt and reviewed with her today  Problem # 2:  HYPOTHYROIDISM (ICD-244.9)  hx graves - residual eye changes and hypothyroid - cont same  Her updated medication list for this problem includes:    Levothroid 112 Mcg Tabs (Levothyroxine sodium) ..... Once daily  Labs Reviewed: TSH: 1.690 (02/04/2010)    Chol: 232 (02/04/2010)   HDL: 58 (02/04/2010)   LDL: 151 (02/04/2010)   TG: 114 (02/04/2010)  Problem # 3:  CAD (ICD-414.00)  Her updated medication list for this problem includes:    Aspirin Ec 325 Mg Tbec (Aspirin) .Marland Kitchen... Take one tablet by mouth daily    Amlodipine Besylate 10 Mg Tabs (Amlodipine besylate) .Marland Kitchen... Take one tablet by mouth daily    Plavix 75 Mg Tabs (Clopidogrel bisulfate) .Marland Kitchen... 1 tab once daily    Accupril 20 Mg Tabs (Quinapril hcl) .Marland Kitchen... Take one tablet by mouth once daily along with a 10mg  accupril tablet once daily    Nitrostat 0.4 Mg Subl (Nitroglycerin) .Marland Kitchen... 1 tablet under tongue at onset of chest pain; you may repeat every 5 minutes for up to 3 doses.  Accupril 10 Mg Tabs (Quinapril hcl) .Marland Kitchen... Take one tablet by mouth once daily along with accupril 20mg  once daily.  She had a non-ST elevation MI in April of 2011 with 2 drug-eluting stents to the right coronary artery.  noncardaic SS symptoms noncardaic (see above) - reassurance offered at length today cont med mgmt and f/u cards as ongoing  Labs  Reviewed: Chol: 232 (02/04/2010)   HDL: 58 (02/04/2010)   LDL: 151 (02/04/2010)   TG: 114 (02/04/2010)  Problem # 4:  HYPERTENSION (ICD-401.9)  Her updated medication list for this problem includes:    Amlodipine Besylate 10 Mg Tabs (Amlodipine besylate) .Marland Kitchen... Take one tablet by mouth daily    Accupril 20 Mg Tabs (Quinapril hcl) .Marland Kitchen... Take one tablet by mouth once daily along with a 10mg  accupril tablet once daily    Accupril 10 Mg Tabs (Quinapril hcl) .Marland Kitchen... Take one tablet by mouth once daily along with accupril 20mg  once daily.  BP today: 138/72 Prior BP: 138/80 (03/07/2010)  Labs Reviewed: K+: 4.1 (05/04/2009) Creat: : 0.87 (02/04/2010)   Chol: 232 (02/04/2010)   HDL: 58 (02/04/2010)   LDL: 151 (02/04/2010)   TG: 114 (02/04/2010)  Problem # 5:  HYPERLIPIDEMIA (ICD-272.4)  Her updated medication list for this problem includes:    Pravastatin Sodium 20 Mg Tabs (Pravastatin sodium) .Marland Kitchen... Take one tablet by mouth daily at bedtime  She had muscle aches on Crestor- stopped same intol of WelChol due to constipation. 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 Take one tablet by mouth daily 2)  Amlodipine Besylate 10 Mg Tabs (Amlodipine besylate) .... Take one tablet by mouth daily 3)  Plavix 75 Mg Tabs (Clopidogrel bisulfate) .Marland Kitchen.. 1 tab once daily 4)  Accupril 20 Mg Tabs (Quinapril hcl) .... Take one tablet by mouth once daily along with a 10mg  accupril tablet once daily 5)  Nitrostat 0.4 Mg Subl (Nitroglycerin) .Marland Kitchen.. 1 tablet under tongue at onset of chest pain; you may repeat every 5 minutes for up to 3 doses. 6)  Levothroid 112 Mcg Tabs (Levothyroxine sodium) .... Once daily 7)  Vitamin D 2000 Unit Tabs (Cholecalciferol) .Marland Kitchen.. 1 by mouth once daily 8)  Colcrys 0.6 Mg Tabs (Colchicine) .Marland Kitchen.. 1 tab as needed 9)  Zolpidem Tartrate 5 Mg Tabs (Zolpidem tartrate) .Marland Kitchen.. 1 tab qhs 10)  Accupril 10 Mg Tabs (Quinapril hcl) .... Take one tablet by mouth once daily along with accupril 20mg  once daily. 11)  Pravastatin Sodium 20 Mg Tabs (Pravastatin sodium) .... Take one tablet by mouth daily at bedtime 12)  Triamcinolone Acetonide 0.1 % Pste (Triamcinolone acetonide) .Marland KitchenMarland KitchenMarland Kitchen  Apply to tongue sore once daily as needed  Other Orders: Gynecologic Referral (Gyn)  Patient Instructions: 1)  it was good to see you today.  2)  medicatons, recent labs and history reviewed - start over the counter vit d 2000units daily in place of prescription dose vit d - but no other medcation changes 3)  multiple cardiac tests since your heart attack show no residual cardiac problems 4)  triamcinolone dental paste for tongue sore - your prescription has been electronically submitted to your pharmacy. Please take as directed. Contact our office if you believe you're having problems with the medication(s).  5)  we'll make referral to  gyn. Our office will contact you regarding this appointment once made.  6)  Please schedule a follow-up appointment in late March 2012 to monitor cholesterol (medication, labs and liver tests), call  sooner if problems.  Prescriptions: TRIAMCINOLONE ACETONIDE 0.1 % PSTE (TRIAMCINOLONE ACETONIDE) apply to tongue sore once daily as needed  #1 x 0   Entered and Authorized by:   Newt Lukes MD   Signed by:   Newt Lukes MD on 03/13/2010   Method used:   Electronically to        [facility] (retail)       [address]       [place], [place]  [address]       Ph: [phone]       Fax: [phone]   RxID:   [id number]    Orders Added: 1)  New Patient Level IV [84132] 2)  Prescription Created Electronically [[id number]] 3)  Gynecologic Referral [Gyn]       Bone Density  Procedure date:  05/11/2009  Findings:      Location:  [facility].    AP Lumbar spine BMD 1.029 T-score -0.2 Z score, 0.2  Left femur neck BMD 0.764 T-score -0.8 Z-score -.05  Mammogram  Procedure date:  05/11/2009  Findings:      Location: [facility].   No specific mammographic evidence of malignancy.  Assessment: BIRADS 1.   Pap Smear  Procedure date:  05/04/2009  Findings:      Interpretation Result:Negative for intraepithelial Lesion or Malignancy.     CXR  Procedure date:  05/04/2009  Findings:      done @ [facility] Impression: Mild bronchitic changes

## 2010-04-18 NOTE — Progress Notes (Signed)
Summary: pt rtn call  Phone Note Call from Patient Call back at Home Phone [phone]   Caller: Patient Reason for Call: Talk to Nurse, Talk to Doctor Summary of Call: pt rtn call Initial call taken by: Omer Jack,  April 02, 2010 4:35 PM  Follow-up for Phone Call        patient is aware of test results Ellender Hose RN  April 02, 2010 5:14 PM  Follow-up by: Whitney Maeola Sarah RN,  April 02, 2010 5:14 PM

## 2010-04-18 NOTE — Progress Notes (Signed)
Summary: Pt?  Phone Note Call from Patient Call back at Home Phone [phone]   Caller: Patient Summary of Call: Pt called stating that Rx sent to pharmacy yesterday by JWJ on behalf of [facility] is the same type of medicine she was previously given, and it is not going to help. Pt is requesting advisement from [facility] on what she can use to help heal sore on tongue, please advise. Initial call taken by: Margaret Pyle, CMA,  March 29, 2010 1:46 PM  Follow-up for Phone Call        will refer to oral surg for dx of this problem - in meanwile, can use lidocaine for discomfort though it is unlikely to help heal sore - erx done Follow-up by: Newt Lukes MD,  March 29, 2010 3:40 PM  Additional Follow-up for Phone Call Additional follow up Details #1::        Pt informed and will use Rx while waiting for referral. Pt will expect call from [facility] about same. Additional Follow-up by: Margaret Pyle, CMA,  March 29, 2010 4:35 PM    New/Updated Medications: LIDOCAINE HCL 2 % GEL (LIDOCAINE HCL) apply to mouth sore three times a day as needed for pain Prescriptions: LIDOCAINE HCL 2 % GEL (LIDOCAINE HCL) apply to mouth sore three times a day as needed for pain  #1 x 0   Entered and Authorized by:   Newt Lukes MD   Signed by:   Newt Lukes MD on 03/29/2010   Method used:   Electronically to        [facility] (retail)       [address]       [place], [place]  [address]       Ph: [phone]       Fax: [phone]   RxID:   [id number]

## 2010-04-19 ENCOUNTER — Encounter: Payer: Self-pay | Admitting: Cardiovascular Disease

## 2010-04-22 ENCOUNTER — Ambulatory Visit: Payer: Self-pay | Admitting: Cardiovascular Disease

## 2010-04-24 NOTE — Progress Notes (Signed)
Summary: PT CALLING RE PLAVIX  Phone Note Call from Patient   Caller: Patient [phone] Reason for Call: Talk to Nurse Summary of Call: PT CALLING RE PLAVIX Initial call taken by: Glynda Jaeger,  April 18, 2010 3:35 PM  Follow-up for Phone Call        I spoke with the pt and she would like to come into the office and meet with Dr Clifton James.  She is a previous pt of Dr Juanda Chance and she would like to discuss the risk involved with holding plavix for oral surgery.  I scheduled appt on 04/22/10 at 1:45.  Follow-up by: Julieta Gutting, RN, BSN,  April 18, 2010 4:02 PM

## 2010-04-24 NOTE — Progress Notes (Signed)
Summary: RE PLAVIX  Phone Note Call from Patient Call back at [facility] Phone [phone]   Caller: Patient Reason for Call: Talk to Nurse Summary of Call: PT HAS QUESTION RE PLAVIX. PT HAVING SURGERY 04/16/10 Initial call taken by: Roe Coombs,  April 15, 2010 12:52 PM  Follow-up for Phone Call        Patient has a growth on her tongue and needs a biopsy. She needs to stop Plavix before this. She had two DES in Apil 2011. She must have this surgery so I will forward this to Dr. Clifton James to see what he recommends. Whitney Maeola Sarah RN  April 15, 2010 4:17 PM  Patient aware. Spoke with [facility] and they will fax [place] the clearance letter. Whitney Maeola Sarah RN  April 16, 2010 3:23 PM  Follow-up by: Whitney Maeola Sarah RN,  April 15, 2010 4:17 PM  Additional Follow-up for Phone Call Additional follow up Details #1::        If her surgery is mandatory (sound like it is), she can stop the Plavix but resume when the biopsy is complete. thanks, cdm Additional Follow-up by: Verne Carrow, MD,  April 16, 2010 2:00 PM     Appended Document: RE PLAVIX Information faxed to her oral surgeon's office today 04/17/10. Patient is aware.

## 2010-04-24 NOTE — Miscellaneous (Signed)
  Clinical Lists Changes  Observations: Added new observation of US CAROTID: Stable, moderate hetrogenous plaque bilaterally. 60-79% RICA stenosis 0-39% LICA stenosis  f/u 6 months (03/22/2010 11:16)      Carotid Doppler  Procedure date:  03/22/2010  Findings:      Stable, moderate hetrogenous plaque bilaterally. 60-79% RICA stenosis 0-39% LICA stenosis  f/u 6 months

## 2010-04-24 NOTE — Progress Notes (Signed)
Summary: pt calling re fax number  Phone Note Call from Patient   Caller: Patient Reason for Call: Talk to Nurse Summary of Call: pt said dental office didn't have the fax, the fax number is [phone] if that wasn't the number you sent it to, can you send it there? Initial call taken by: Glynda Jaeger,  April 17, 2010 11:35 AM  Follow-up for Phone Call        Spoke to patient and they did recieve the fax. Whitney Maeola Sarah RN  April 17, 2010 12:40 PM  Follow-up by: Whitney Maeola Sarah RN,  April 17, 2010 12:40 PM

## 2010-05-30 LAB — POCT I-STAT, CHEM 8
Chloride: 108 mEq/L (ref 96–112)
Creatinine, Ser: 1 mg/dL (ref 0.4–1.2)
HCT: 38 % (ref 36.0–46.0)
Hemoglobin: 12.9 g/dL (ref 12.0–15.0)
Potassium: 4 mEq/L (ref 3.5–5.1)
Sodium: 142 mEq/L (ref 135–145)

## 2010-05-30 LAB — COMPREHENSIVE METABOLIC PANEL
AST: 29 U/L (ref 0–37)
Albumin: 3.5 g/dL (ref 3.5–5.2)
Calcium: 8.6 mg/dL (ref 8.4–10.5)
Creatinine, Ser: 0.9 mg/dL (ref 0.4–1.2)
GFR calc Af Amer: >60 mL/min (ref >60)

## 2010-05-30 LAB — TROPONIN I: Troponin I: 0.03 ng/mL (ref 0.00–0.06)

## 2010-05-30 LAB — CBC
Hemoglobin: 12.4 g/dL (ref 12.0–15.0)
MCH: 30.2 pg (ref 26.0–34.0)
MCHC: 32.7 g/dL (ref 30.0–36.0)
MCHC: 33.3 g/dL (ref 30.0–36.0)
Platelets: 192 10*3/uL (ref 150–400)
Platelets: 196 10*3/uL (ref 150–400)
RBC: 4.18 MIL/uL (ref 3.87–5.11)
RDW: 14.4 % (ref 11.5–15.5)
RDW: 14.5 % (ref 11.5–15.5)
WBC: 7.9 10*3/uL (ref 4.0–10.5)
WBC: 8.2 10*3/uL (ref 4.0–10.5)

## 2010-05-30 LAB — CARDIAC PANEL(CRET KIN+CKTOT+MB+TROPI)
CK, MB: 1.3 ng/mL (ref 0.3–4.0)
Relative Index: 0.7 (ref 0.0–2.5)
Total CK: 178 U/L — ABNORMAL HIGH (ref 7–177)
Troponin I: 0.01 ng/mL (ref 0.00–0.06)

## 2010-05-30 LAB — CK TOTAL AND CKMB (NOT AT ARMC)
Relative Index: 0.9 (ref 0.0–2.5)
Total CK: 199 U/L — ABNORMAL HIGH (ref 7–177)

## 2010-05-30 LAB — TSH: TSH: 5.08 u[IU]/mL — ABNORMAL HIGH (ref 0.350–4.500)

## 2010-05-30 LAB — POCT CARDIAC MARKERS: Troponin i, poc: <0.05 ng/mL (ref 0.00–0.09)

## 2010-05-30 LAB — GC/CHLAMYDIA PROBE AMP, GENITAL: Chlamydia, DNA Probe: NEGATIVE

## 2010-05-30 LAB — WET PREP, GENITAL: Yeast Wet Prep HPF POC: NONE SEEN

## 2010-06-04 LAB — CARDIAC PANEL(CRET KIN+CKTOT+MB+TROPI)
CK, MB: 2 ng/mL (ref 0.3–4.0)
CK, MB: 2.8 ng/mL (ref 0.3–4.0)
Relative Index: 1.9 (ref 0.0–2.5)
Total CK: 104 U/L (ref 7–177)
Total CK: 122 U/L (ref 7–177)
Troponin I: 0.42 ng/mL — ABNORMAL HIGH (ref 0.00–0.06)

## 2010-06-04 LAB — DIFFERENTIAL
Basophils Absolute: 0 10*3/uL (ref 0.0–0.1)
Basophils Relative: 1 % (ref 0–1)
Basophils Relative: 1 % (ref 0–1)
Lymphocytes Relative: 29 % (ref 12–46)
Lymphocytes Relative: 30 % (ref 12–46)
Lymphs Abs: 1.7 10*3/uL (ref 0.7–4.0)
Monocytes Absolute: 0.5 10*3/uL (ref 0.1–1.0)
Monocytes Relative: 14 % — ABNORMAL HIGH (ref 3–12)
Monocytes Relative: 6 % (ref 3–12)
Neutro Abs: 3.1 10*3/uL (ref 1.7–7.7)
Neutro Abs: 3.5 10*3/uL (ref 1.7–7.7)
Neutro Abs: 4.8 10*3/uL (ref 1.7–7.7)
Neutrophils Relative %: 55 % (ref 43–77)
Neutrophils Relative %: 59 % (ref 43–77)

## 2010-06-04 LAB — BASIC METABOLIC PANEL
BUN: 10 mg/dL (ref 6–23)
CO2: 26 mEq/L (ref 19–32)
Calcium: 8.9 mg/dL (ref 8.4–10.5)
Chloride: 110 mEq/L (ref 96–112)
Creatinine, Ser: 0.8 mg/dL (ref 0.4–1.2)
GFR calc Af Amer: >60 mL/min (ref >60)
GFR calc non Af Amer: >60 mL/min (ref >60)
Sodium: 144 mEq/L (ref 135–145)

## 2010-06-04 LAB — CBC
Hemoglobin: 14.1 g/dL (ref 12.0–15.0)
MCHC: 34.6 g/dL (ref 30.0–36.0)
MCHC: 33 g/dL (ref 30.0–36.0)
Platelets: 201 10*3/uL (ref 150–400)
RBC: 4.03 MIL/uL (ref 3.87–5.11)
RBC: 4.55 MIL/uL (ref 3.87–5.11)
RDW: 14.1 % (ref 11.5–15.5)
WBC: 5.9 10*3/uL (ref 4.0–10.5)

## 2010-06-04 LAB — POCT CARDIAC MARKERS
CKMB, poc: 2.4 ng/mL (ref 1.0–8.0)
Troponin i, poc: 0.09 ng/mL (ref 0.00–0.09)

## 2010-06-04 LAB — LIPID PANEL
Cholesterol: 212 mg/dL — ABNORMAL HIGH (ref 0–200)
LDL Cholesterol: 152 mg/dL — ABNORMAL HIGH (ref 0–99)
Total CHOL/HDL Ratio: 4.8 RATIO
Triglycerides: 79 mg/dL (ref <150)

## 2010-06-04 LAB — PROTIME-INR: INR: 1.02 (ref 0.00–1.49)

## 2010-06-04 LAB — T3: T3, Total: 112.7 ng/dl (ref 80.0–204.0)

## 2010-06-04 LAB — APTT: aPTT: 31 seconds (ref 24–37)

## 2010-06-05 ENCOUNTER — Encounter: Payer: Self-pay | Admitting: *Deleted

## 2010-06-12 ENCOUNTER — Encounter: Payer: Self-pay | Admitting: Internal Medicine

## 2010-06-14 ENCOUNTER — Ambulatory Visit: Payer: Self-pay | Admitting: Internal Medicine

## 2010-06-17 ENCOUNTER — Telehealth: Payer: Self-pay | Admitting: Cardiovascular Disease

## 2010-06-17 NOTE — Telephone Encounter (Signed)
Pt needs sur clearance for dental work. Dr office has question re plavix.

## 2010-06-18 NOTE — Telephone Encounter (Signed)
Surgical Clearance faxed to dental office. Patient may stop Plavix prior to dental procedure.

## 2010-06-19 ENCOUNTER — Telehealth: Payer: Self-pay | Admitting: Cardiovascular Disease

## 2010-06-19 NOTE — Telephone Encounter (Signed)
Patient needed clarification regarding her surgical instructions. She may stop Plavix prior to her procedure and should resume afterwards. She is aware. Clearance has been faxed to dental office.

## 2010-06-19 NOTE — Telephone Encounter (Signed)
Pt states she having dental work done pt has question re her plavix

## 2010-06-20 ENCOUNTER — Other Ambulatory Visit: Payer: Self-pay | Admitting: *Deleted

## 2010-06-20 MED ORDER — QUINAPRIL HCL 10 MG PO TABS: 10.0000 mg | ORAL_TABLET | Freq: Every day | ORAL | Status: DC

## 2010-07-04 ENCOUNTER — Telehealth: Payer: Self-pay | Admitting: Cardiovascular Disease

## 2010-07-04 NOTE — Telephone Encounter (Signed)
Pt calling re having a headache and her bp today was 161/80 wants to see dr Clifton James today

## 2010-07-04 NOTE — Telephone Encounter (Signed)
Spoke with pt. She reports she has had a headache for 3 days off and on.  Headache eases with Advil. Today at work blood pressure was 161/80. She is taking accupril 30 mg daily and amlodipine 10 mg daily and has taken these today.  She has not rechecked blood pressure. No other complaints.  Did not request to see Dr. Clifton James today but just wanted to let us know about her blood pressure.  I asked pt to record blood pressure daily for next 5 days and call us on Tuesday with readings.  Pt also reports she started a multifiber cleanse and multifiber digestion and detox support supplement about a month ago. Supplement is from [facility] store and she takes twice daily.

## 2010-07-05 NOTE — Telephone Encounter (Signed)
Agree. cdm 

## 2010-07-12 ENCOUNTER — Telehealth: Payer: Self-pay | Admitting: Cardiovascular Disease

## 2010-07-12 NOTE — Telephone Encounter (Signed)
Pt calling stating she needs to see dr Clifton James but was advised he was not here--pt has cold and chest is tight--advised to call her PCP or go to urgent care--pt also wants to go back on beta blocker that dr Juanda Chance prescribed for her--advised to call and  make an appoint to see dr mcalhany--pt agreed--nt

## 2010-07-12 NOTE — Telephone Encounter (Signed)
Pt wants to see dr Clifton James today, she has a cold and her chest is tight, I told her he wasn't here, she wants a nurse call asap to see if she can be seen by someone else, she also wants to know if she needs to get back on a beta blocker.

## 2010-07-25 NOTE — Telephone Encounter (Signed)
Patient will see Dr. Clifton James 08/14/10.

## 2010-08-06 ENCOUNTER — Emergency Department (HOSPITAL_BASED_OUTPATIENT_CLINIC_OR_DEPARTMENT_OTHER)
Admission: EM | Admit: 2010-08-06 | Discharge: 2010-08-07 | Disposition: A | Discharge status: Home / Self Care | Payer: BC Managed Care – PPO | Attending: Emergency Medicine | Admitting: Emergency Medicine

## 2010-08-06 ENCOUNTER — Emergency Department (INDEPENDENT_AMBULATORY_CARE_PROVIDER_SITE_OTHER): Payer: BC Managed Care – PPO

## 2010-08-06 DIAGNOSIS — I2510 Atherosclerotic heart disease of native coronary artery without angina pectoris: Secondary | ICD-10-CM | POA: Insufficient documentation

## 2010-08-06 DIAGNOSIS — E7800 Pure hypercholesterolemia, unspecified: Secondary | ICD-10-CM | POA: Insufficient documentation

## 2010-08-06 DIAGNOSIS — I252 Old myocardial infarction: Secondary | ICD-10-CM | POA: Insufficient documentation

## 2010-08-06 DIAGNOSIS — R05 Cough: Secondary | ICD-10-CM

## 2010-08-06 DIAGNOSIS — R059 Cough, unspecified: Secondary | ICD-10-CM

## 2010-08-06 DIAGNOSIS — I10 Essential (primary) hypertension: Secondary | ICD-10-CM | POA: Insufficient documentation

## 2010-08-06 DIAGNOSIS — R079 Chest pain, unspecified: Secondary | ICD-10-CM

## 2010-08-06 DIAGNOSIS — J40 Bronchitis, not specified as acute or chronic: Secondary | ICD-10-CM | POA: Insufficient documentation

## 2010-08-14 ENCOUNTER — Ambulatory Visit (INDEPENDENT_AMBULATORY_CARE_PROVIDER_SITE_OTHER): Payer: BC Managed Care – PPO | Admitting: Cardiovascular Disease

## 2010-08-14 ENCOUNTER — Encounter: Payer: Self-pay | Admitting: Cardiovascular Disease

## 2010-08-14 VITALS — BP 132/84 | HR 84 | Resp 18 | Ht 66.0 in | Wt 246.0 lb

## 2010-08-14 DIAGNOSIS — R5383 Other fatigue: Secondary | ICD-10-CM

## 2010-08-14 DIAGNOSIS — I2510 Atherosclerotic heart disease of native coronary artery without angina pectoris: Secondary | ICD-10-CM

## 2010-08-14 DIAGNOSIS — E785 Hyperlipidemia, unspecified: Secondary | ICD-10-CM

## 2010-08-14 DIAGNOSIS — I10 Essential (primary) hypertension: Secondary | ICD-10-CM

## 2010-08-14 DIAGNOSIS — R5381 Other malaise: Secondary | ICD-10-CM

## 2010-08-14 DIAGNOSIS — I6529 Occlusion and stenosis of unspecified carotid artery: Secondary | ICD-10-CM

## 2010-08-14 MED ORDER — ASPIRIN 81 MG PO TABS: 81.0000 mg | ORAL_TABLET | Freq: Every day | ORAL | Status: DC

## 2010-08-14 MED ORDER — AMLODIPINE BESYLATE 10 MG PO TABS: 10.0000 mg | ORAL_TABLET | Freq: Every day | ORAL | Status: DC

## 2010-08-14 MED ORDER — CLOPIDOGREL BISULFATE 75 MG PO TABS: 75.0000 mg | ORAL_TABLET | Freq: Every day | ORAL | Status: DC

## 2010-08-14 MED ORDER — QUINAPRIL HCL 40 MG PO TABS: 40.0000 mg | ORAL_TABLET | Freq: Every day | ORAL | Status: DC

## 2010-08-14 MED ORDER — PRAVASTATIN SODIUM 20 MG PO TABS: 20.0000 mg | ORAL_TABLET | Freq: Every day | ORAL | Status: DC

## 2010-08-14 NOTE — Assessment & Plan Note (Signed)
Will repeat carotid dopplers in 2 months.

## 2010-08-14 NOTE — Patient Instructions (Signed)
Your physician recommends that you schedule a follow-up appointment in: 6 months  Your physician has requested that you have a carotid duplex. This test is an ultrasound of the carotid arteries in your neck. It looks at blood flow through these arteries that supply the brain with blood. Allow one hour for this exam. There are no restrictions or special instructions.    

## 2010-08-14 NOTE — Assessment & Plan Note (Addendum)
Stable. No changes. Will check TSH.

## 2010-08-14 NOTE — Assessment & Plan Note (Signed)
BP controlled but at upper limits of normal. Will increase Quinipril to 40 mg po Qdaily.

## 2010-08-14 NOTE — Assessment & Plan Note (Signed)
Continue statin. Will check lipids/LFTs today.

## 2010-08-14 NOTE — Progress Notes (Signed)
History of Present Illness:60 yo AAF with h/o CAD, hyperlipidemia, hypothyroidism, HTN here for cardiac follow up. She has been followed in the past by Dr. Juanda Chance. She had an Mi in 06/2009 and had 2 DES placed in the RCA. Outpatient stress myoview without ischemia. She also has carotid disease and had 60-80% stenosis by Doppler's on the right side in January 2012, minimal disease LICA.   Recent episode of chest pain for which she went to the [facility], felt to have bronchitis. EKG was normal. She has felt better since then. Still with fatigue.   Past Medical History  Diagnosis Date  . HYPOTHYROIDISM 07/23/2009  . HYPERLIPIDEMIA 07/23/2009  . HYPERTENSION 07/23/2009  . CAD 07/23/2009  . CAROTID STENOSIS 07/24/2009  . PVD 07/23/2009  . GRAVES' DISEASE, HX OF 07/23/2009  . VITAMIN D DEFICIENCY 03/15/2010  . TOBACCO ABUSE 03/15/2010  . CORONARY ATHEROSCLEROSIS NATIVE CORONARY ARTERY 04/01/2010  . FATIGUE / MALAISE 03/07/2010    Past Surgical History  Procedure Date  . Carotid stent     bilateral carotid artery disease post stent of left carotid  . Radioactive plaque insertion     Graves disease post radioactive treatment complicated hypothyroidism    Current Outpatient Prescriptions  Medication Sig Dispense Refill  . albuterol (PROVENTIL HFA;VENTOLIN HFA) 108 (90 BASE) MCG/ACT inhaler Inhale 2 puffs into the lungs every 6 (six) hours as needed.        Marland Kitchen amLODipine (NORVASC) 10 MG tablet Take 10 mg by mouth daily.        Marland Kitchen aspirin 325 MG tablet Take 325 mg by mouth daily.        . clopidogrel (PLAVIX) 75 MG tablet Take 75 mg by mouth daily.        . colchicine 0.6 MG tablet Take 0.6 mg by mouth daily.        Marland Kitchen levothyroxine (SYNTHROID, LEVOTHROID) 112 MCG tablet Take 112 mcg by mouth daily.        . nitroGLYCERIN (NITROSTAT) 0.4 MG SL tablet Place 0.4 mg under the tongue every 5 (five) minutes as needed.        . pravastatin (PRAVACHOL) 20 MG tablet Take 20 mg by mouth at bedtime.          . quinapril (ACCUPRIL) 10 MG tablet Take 1 tablet (10 mg total) by mouth daily.  30 tablet  6  . quinapril (ACCUPRIL) 20 MG tablet Take 20 mg by mouth daily.        Marland Kitchen zolpidem (AMBIEN) 5 MG tablet Take 5 mg by mouth at bedtime as needed.        Marland Kitchen DISCONTD: aspirin 81 MG EC tablet Take 81 mg by mouth daily.        Marland Kitchen DISCONTD: Cholecalciferol (VITAMIN D) 2000 UNIT CAPS Take by mouth daily.        Marland Kitchen DISCONTD: triamcinolone (KENALOG) 0.1 % paste Place onto teeth as needed.          No Known Allergies  History   Social History  . Marital Status: Divorced    Spouse Name: N/A    Number of Children: N/A  . Years of Education: N/A   Occupational History  . Not on file.   Social History Main Topics  . Smoking status: Former Smoker    Quit date: 06/30/2009  . 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  Divorced, single, lives  alone- 2 grown dtr nearby, 3 g-kds  . Alcohol Use:   . Drug Use:   . Sexually Active:      Quit smoking after MI- prev 1ppd x 35ys   Other Topics Concern  . Not on file   Social History Narrative  . No narrative on file    Family History  Problem Relation Age of Onset  . Coronary artery disease Other   . Hypertension Other     Review of Systems:  As stated in the HPI and otherwise negative.   BP 132/84  Pulse 84  Resp 18  Ht 5\' 6"  (1.676 m)  Wt 246 lb (111.585 kg)  BMI 39.71 kg/m2  Physical Examination: General: Well developed, well nourished, NAD HEENT: OP clear, mucus membranes moist SKIN: warm, dry. No rashes. Neuro: No focal deficits Musculoskeletal: Muscle strength 5/5 all ext Psychiatric: Mood and affect normal Neck: No JVD, no carotid bruits, no thyromegaly, no lymphadenopathy. Lungs:Clear bilaterally, no wheezes, rhonci, crackles Cardiovascular: Regular rate and rhythm. No murmurs, gallops or rubs. Abdomen:Soft. Bowel sounds present.  Non-tender.  Extremities: No lower extremity edema. Pulses are 2 + in the bilateral DP/PT.

## 2010-08-15 LAB — HEPATIC FUNCTION PANEL
ALT: 16 U/L (ref 0–35)
AST: 21 U/L (ref 0–37)
Alkaline Phosphatase: 75 U/L (ref 39–117)
Bilirubin, Direct: 0 mg/dL (ref 0.0–0.3)
Total Bilirubin: 0.6 mg/dL (ref 0.3–1.2)
Total Protein: 7.5 g/dL (ref 6.0–8.3)

## 2010-08-15 LAB — LIPID PANEL: HDL: 61.2 mg/dL (ref >39.00)

## 2010-08-20 ENCOUNTER — Telehealth: Payer: Self-pay | Admitting: Cardiology

## 2010-08-20 DIAGNOSIS — E785 Hyperlipidemia, unspecified: Secondary | ICD-10-CM

## 2010-08-20 MED ORDER — PRAVASTATIN SODIUM 40 MG PO TABS: 40.0000 mg | ORAL_TABLET | Freq: Every day | ORAL | Status: DC

## 2010-08-20 NOTE — Telephone Encounter (Signed)
Patient is aware of test/lab results.  

## 2010-08-20 NOTE — Telephone Encounter (Signed)
Message copied by Ellender Hose on Tue Aug 20, 2010 11:38 AM ------      Message from: Verne Carrow D      Created: Mon Aug 19, 2010  1:28 PM       Cholesterol not at goal. Her Pravastatin should be increased to 40 mg per day. Repeat lipids in 12 weeks. LFTs are normal. Can we let her know? Thanks, chris

## 2010-09-10 ENCOUNTER — Other Ambulatory Visit: Payer: Self-pay | Admitting: *Deleted

## 2010-09-10 DIAGNOSIS — I2510 Atherosclerotic heart disease of native coronary artery without angina pectoris: Secondary | ICD-10-CM

## 2010-09-10 MED ORDER — QUINAPRIL HCL 40 MG PO TABS: 40.0000 mg | ORAL_TABLET | Freq: Every day | ORAL | Status: DC

## 2010-09-13 ENCOUNTER — Encounter (INDEPENDENT_AMBULATORY_CARE_PROVIDER_SITE_OTHER): Payer: BC Managed Care – PPO | Admitting: Cardiology

## 2010-09-13 DIAGNOSIS — I6529 Occlusion and stenosis of unspecified carotid artery: Secondary | ICD-10-CM

## 2010-09-16 ENCOUNTER — Encounter: Payer: Self-pay | Admitting: Cardiovascular Disease

## 2010-09-27 ENCOUNTER — Other Ambulatory Visit: Payer: Self-pay | Admitting: *Deleted

## 2010-09-27 DIAGNOSIS — I2510 Atherosclerotic heart disease of native coronary artery without angina pectoris: Secondary | ICD-10-CM

## 2010-09-27 MED ORDER — QUINAPRIL HCL 40 MG PO TABS: 40.0000 mg | ORAL_TABLET | Freq: Every day | ORAL | Status: DC

## 2010-10-01 ENCOUNTER — Encounter: Payer: BC Managed Care – PPO | Admitting: *Deleted

## 2010-11-11 ENCOUNTER — Telehealth: Payer: Self-pay | Admitting: *Deleted

## 2010-11-11 NOTE — Telephone Encounter (Signed)
need to verify what dose of accupril pt is taking 20 or 40 mg? chart states 40 mg ,paper fax for refil states 20 mg. Caitlyn [name]

## 2010-11-14 ENCOUNTER — Telehealth: Payer: Self-pay | Admitting: Cardiovascular Disease

## 2010-11-14 NOTE — Telephone Encounter (Addendum)
Pt rtn call re meds from a couple days ago, ok to leave message

## 2010-11-14 NOTE — Telephone Encounter (Signed)
Left message informing patient that we refilled her Accupril 40 mg daily and to call us back if this dose was incorrect.

## 2010-11-19 ENCOUNTER — Ambulatory Visit (INDEPENDENT_AMBULATORY_CARE_PROVIDER_SITE_OTHER): Payer: Self-pay | Admitting: *Deleted

## 2010-11-19 DIAGNOSIS — E785 Hyperlipidemia, unspecified: Secondary | ICD-10-CM

## 2010-11-19 LAB — LIPID PANEL: HDL: 49 mg/dL (ref >39.00)

## 2010-11-19 LAB — LDL CHOLESTEROL, DIRECT: Direct LDL: 161.6 mg/dL

## 2010-12-16 LAB — POCT CARDIAC MARKERS
CKMB, poc: <1 — ABNORMAL LOW
CKMB, poc: <1 — ABNORMAL LOW
Myoglobin, poc: 43.6
Troponin i, poc: <0.05

## 2010-12-16 LAB — DIFFERENTIAL
Basophils Absolute: 0.1
Basophils Relative: 1
Eosinophils Absolute: 0.2
Neutro Abs: 4
Neutrophils Relative %: 52

## 2010-12-16 LAB — POCT I-STAT, CHEM 8
Creatinine, Ser: 1.1
HCT: 42
Hemoglobin: 14.3
Sodium: 141
TCO2: 27

## 2010-12-16 LAB — CBC
MCHC: 34.2
RBC: 4.19
RDW: 13.1

## 2010-12-21 ENCOUNTER — Inpatient Hospital Stay (INDEPENDENT_AMBULATORY_CARE_PROVIDER_SITE_OTHER)
Admission: RE | Admit: 2010-12-21 | Discharge: 2010-12-21 | Disposition: A | Discharge status: Home / Self Care | Payer: Self-pay | Source: Ambulatory Visit | Attending: Emergency Medicine | Admitting: Emergency Medicine

## 2010-12-21 DIAGNOSIS — R059 Cough, unspecified: Secondary | ICD-10-CM

## 2010-12-21 DIAGNOSIS — R071 Chest pain on breathing: Secondary | ICD-10-CM

## 2010-12-21 DIAGNOSIS — R05 Cough: Secondary | ICD-10-CM

## 2011-01-06 ENCOUNTER — Other Ambulatory Visit: Payer: Self-pay

## 2011-01-06 MED ORDER — LEVOTHYROXINE SODIUM 112 MCG PO TABS: 112.0000 ug | ORAL_TABLET | Freq: Every day | ORAL | Status: DC

## 2011-01-06 NOTE — Telephone Encounter (Signed)
.   Requested Prescriptions   Signed Prescriptions Disp Refills  . levothyroxine (SYNTHROID, LEVOTHROID) 112 MCG tablet 30 tablet 6    Sig: Take 1 tablet (112 mcg total) by mouth daily.    Authorizing Provider: Verne Carrow    Ordering User: Lacie Scotts   E-scribe to Randa Evens Dr, Jacky Kindle.

## 2011-01-09 ENCOUNTER — Other Ambulatory Visit: Payer: Self-pay | Admitting: Cardiovascular Disease

## 2011-01-09 MED ORDER — LEVOTHYROXINE SODIUM 112 MCG PO TABS: 112.0000 ug | ORAL_TABLET | Freq: Every day | ORAL | Status: DC

## 2011-01-09 NOTE — Telephone Encounter (Signed)
Sent to [facility].  Please resend to [facility]

## 2011-02-10 ENCOUNTER — Telehealth: Payer: Self-pay | Admitting: Cardiovascular Disease

## 2011-02-10 NOTE — Telephone Encounter (Signed)
Spoke with pt and confirmed her dose of synthroid is 112 mcg daily and she states  this is what she has been taking. This was filled on January 09, 2011 and she is out of medicine and needs refill. She states label directions read to take half tablet daily.  I told pt I would call [facility] to clarify. Chart review shows we sent refill with for 112 mcg daily with 6 refills to [facility] [address] .  I called and spoke with pharmacist at [facility] Loraine Leriche) who states they had received voice message on October 12 with instructions for half tablet daily.  I reviewed chart again and last dose listed is 112 mcg po daily. Pharmacist will change prescription to 112 mcg daily with 6 refills.

## 2011-02-10 NOTE — Telephone Encounter (Signed)
New message:  Pt thinks she may have taken the wrong dose of Synthroid.  Please call her back and discuss.  She is out of medication and the month is not over yet.

## 2011-03-15 ENCOUNTER — Encounter (HOSPITAL_COMMUNITY): Payer: Self-pay | Admitting: Adult Health

## 2011-03-15 ENCOUNTER — Inpatient Hospital Stay (HOSPITAL_COMMUNITY)
Admission: EM | Admit: 2011-03-15 | Discharge: 2011-03-18 | DRG: 287 | Disposition: A | Discharge status: Home / Self Care | Payer: Self-pay | Attending: Cardiology | Admitting: Cardiology

## 2011-03-15 ENCOUNTER — Emergency Department (HOSPITAL_COMMUNITY): Payer: Self-pay

## 2011-03-15 ENCOUNTER — Other Ambulatory Visit: Payer: Self-pay

## 2011-03-15 DIAGNOSIS — A498 Other bacterial infections of unspecified site: Secondary | ICD-10-CM | POA: Diagnosis present

## 2011-03-15 DIAGNOSIS — Z7902 Long term (current) use of antithrombotics/antiplatelets: Secondary | ICD-10-CM

## 2011-03-15 DIAGNOSIS — R079 Chest pain, unspecified: Secondary | ICD-10-CM

## 2011-03-15 DIAGNOSIS — I200 Unstable angina: Secondary | ICD-10-CM | POA: Diagnosis present

## 2011-03-15 DIAGNOSIS — I2510 Atherosclerotic heart disease of native coronary artery without angina pectoris: Principal | ICD-10-CM | POA: Diagnosis present

## 2011-03-15 DIAGNOSIS — Z7982 Long term (current) use of aspirin: Secondary | ICD-10-CM

## 2011-03-15 DIAGNOSIS — I252 Old myocardial infarction: Secondary | ICD-10-CM

## 2011-03-15 DIAGNOSIS — F17200 Nicotine dependence, unspecified, uncomplicated: Secondary | ICD-10-CM | POA: Diagnosis present

## 2011-03-15 DIAGNOSIS — N390 Urinary tract infection, site not specified: Secondary | ICD-10-CM | POA: Diagnosis present

## 2011-03-15 DIAGNOSIS — I10 Essential (primary) hypertension: Secondary | ICD-10-CM | POA: Diagnosis present

## 2011-03-15 DIAGNOSIS — Z79899 Other long term (current) drug therapy: Secondary | ICD-10-CM

## 2011-03-15 DIAGNOSIS — E785 Hyperlipidemia, unspecified: Secondary | ICD-10-CM | POA: Diagnosis present

## 2011-03-15 DIAGNOSIS — Z9861 Coronary angioplasty status: Secondary | ICD-10-CM

## 2011-03-15 DIAGNOSIS — I739 Peripheral vascular disease, unspecified: Secondary | ICD-10-CM | POA: Diagnosis present

## 2011-03-15 DIAGNOSIS — E039 Hypothyroidism, unspecified: Secondary | ICD-10-CM | POA: Diagnosis present

## 2011-03-15 DIAGNOSIS — I249 Acute ischemic heart disease, unspecified: Secondary | ICD-10-CM

## 2011-03-15 DIAGNOSIS — I6529 Occlusion and stenosis of unspecified carotid artery: Secondary | ICD-10-CM | POA: Diagnosis present

## 2011-03-15 DIAGNOSIS — I214 Non-ST elevation (NSTEMI) myocardial infarction: Secondary | ICD-10-CM

## 2011-03-15 HISTORY — DX: Acute myocardial infarction, unspecified: I21.9

## 2011-03-15 LAB — BASIC METABOLIC PANEL
CO2: 28 mEq/L (ref 19–32)
Calcium: 9.4 mg/dL (ref 8.4–10.5)
Creatinine, Ser: 0.85 mg/dL (ref 0.50–1.10)
GFR calc non Af Amer: 74 mL/min — ABNORMAL LOW (ref >90)
Glucose, Bld: 67 mg/dL — ABNORMAL LOW (ref 70–99)
Sodium: 140 mEq/L (ref 135–145)

## 2011-03-15 LAB — CBC
Hemoglobin: 13.7 g/dL (ref 12.0–15.0)
MCH: 29.3 pg (ref 26.0–34.0)
MCHC: 32.2 g/dL (ref 30.0–36.0)
MCV: 91 fL (ref 78.0–100.0)

## 2011-03-15 LAB — CARDIAC PANEL(CRET KIN+CKTOT+MB+TROPI)
CK, MB: 2.2 ng/mL (ref 0.3–4.0)
Relative Index: 2.1 (ref 0.0–2.5)
Troponin I: <0.3 ng/mL (ref <0.30)

## 2011-03-15 LAB — URINALYSIS, ROUTINE W REFLEX MICROSCOPIC
Bilirubin Urine: NEGATIVE
Ketones, ur: NEGATIVE mg/dL
Nitrite: NEGATIVE
Urobilinogen, UA: 0.2 mg/dL (ref 0.0–1.0)

## 2011-03-15 LAB — URINE MICROSCOPIC-ADD ON

## 2011-03-15 LAB — PROTIME-INR
INR: 0.89 (ref 0.00–1.49)
Prothrombin Time: 12.2 seconds (ref 11.6–15.2)

## 2011-03-15 LAB — POCT I-STAT TROPONIN I

## 2011-03-15 MED ORDER — ASPIRIN 325 MG PO TABS: 325.0000 mg | ORAL_TABLET | ORAL | Status: DC

## 2011-03-15 MED ORDER — NITROFURANTOIN MONOHYD MACRO 100 MG PO CAPS
100.0000 mg | ORAL_CAPSULE | Freq: Once | ORAL | Status: AC
  Administered 2011-03-15: 100 mg via ORAL
  Filled 2011-03-15: qty 1

## 2011-03-15 MED ORDER — NITROGLYCERIN 0.4 MG SL SUBL: 0.4000 mg | SUBLINGUAL_TABLET | SUBLINGUAL | Status: DC | PRN

## 2011-03-15 MED ORDER — OXYCODONE-ACETAMINOPHEN 5-325 MG PO TABS
1.0000 | ORAL_TABLET | Freq: Once | ORAL | Status: AC
  Administered 2011-03-15: 1 via ORAL
  Filled 2011-03-15: qty 1

## 2011-03-15 MED ORDER — METOPROLOL TARTRATE 1 MG/ML IV SOLN
5.0000 mg | Freq: Once | INTRAVENOUS | Status: AC
  Administered 2011-03-15: 5 mg via INTRAVENOUS
  Filled 2011-03-15: qty 5

## 2011-03-15 MED ORDER — HEPARIN BOLUS VIA INFUSION
4000.0000 [IU] | Freq: Once | INTRAVENOUS | Status: AC
  Administered 2011-03-15: 4000 [IU] via INTRAVENOUS
  Filled 2011-03-15: qty 4000

## 2011-03-15 MED ORDER — ASPIRIN 81 MG PO CHEW
324.0000 mg | CHEWABLE_TABLET | Freq: Once | ORAL | Status: AC
  Administered 2011-03-15: 324 mg via ORAL
  Filled 2011-03-15: qty 4

## 2011-03-15 MED ORDER — HEPARIN SOD (PORCINE) IN D5W 100 UNIT/ML IV SOLN
12.0000 [IU]/kg/h | INTRAVENOUS | Status: DC
  Administered 2011-03-15 – 2011-03-16 (×2): 12 [IU]/kg/h via INTRAVENOUS
  Filled 2011-03-15 (×3): qty 250

## 2011-03-15 NOTE — H&P (Signed)
Caitlyn [name] is an 60 y.o. female with a history of CAD s/p PCI  Chief Complaint: Chest pain HPI: Patient complains of long standing chest pain with intermittent periods of worsening. Her chest pain apparently has been on going for several months and vaguely described. Pain is localized to the substernal area, there have been no noticeable exacerbation or relieving factors, no associated nausea, vomiting or diaphoresis, no associated shortness of breath, dizziness or palpitations. Pain is 5/10 in intensity and does not radiate anywhere. She has had periods of intermittent coughing which is non productive. She states she was just treated for "bronchitis" a few weeks back. Her cardiac history is significant for a NSTEMI in April 2011 and a cardiac catheterization at that time revealed a 90% RCA lesion, and 60-70% lesions in the LAD and LCX. Only the RCA was intervened upon by PCI and the other lesions were deemed non critical at the time. She states that the chest pain she had at that time was different from what she is having now but besides severity, she was unable to clarify the difference. Exercise tolerance is somewhat limited chronically, she is able to perform activities of daily living without any shortness of breath and she is able to walk several times up and down the hall way at work without difficulty. She is unable to walk several blocks however without shortness of breath. She denies leg edema, PND or orthopnea.   Past Medical History  Diagnosis Date  . HYPOTHYROIDISM 07/23/2009  . HYPERLIPIDEMIA 07/23/2009  . HYPERTENSION 07/23/2009  . CAD 07/23/2009  . CAROTID STENOSIS 07/24/2009  . PVD 07/23/2009  . GRAVES' DISEASE, HX OF 07/23/2009  . VITAMIN D DEFICIENCY 03/15/2010  . TOBACCO ABUSE 03/15/2010  . CORONARY ATHEROSCLEROSIS NATIVE CORONARY ARTERY 04/01/2010  . FATIGUE / MALAISE 03/07/2010  . Myocardial infarction     Past Surgical History  Procedure Date  . Carotid stent     bilateral  carotid artery disease post stent of left carotid  . Radioactive plaque insertion     Graves disease post radioactive treatment complicated hypothyroidism    Family History  Problem Relation Age of Onset  . Coronary artery disease Other   . Hypertension Other    Social History:  reports that she quit smoking about 20 months ago. She does not have any smokeless tobacco history on file. Her alcohol and drug histories not on file.  Allergies: No Known Allergies  Medications Prior to Admission  Medication Dose Route Frequency Provider Last Rate Last Dose  . aspirin chewable tablet 324 mg  324 mg Oral Once Hilario Quarry, MD   324 mg at 03/15/11 1642  . heparin ADULT infusion 100 units/ml (25000 units/250 ml)  12 Units/kg/hr Intravenous Continuous Annia Belt, PHARMD 12.5 mL/hr at 03/15/11 2127 12 Units/kg/hr at 03/15/11 2127  . heparin bolus via infusion 4,000 Units  4,000 Units Intravenous Once Annia Belt, PHARMD   4,000 Units at 03/15/11 2127  . metoprolol (LOPRESSOR) injection 5 mg  5 mg Intravenous Once [employer], PA   5 mg at 03/15/11 1953  . nitrofurantoin (macrocrystal-monohydrate) (MACROBID) capsule 100 mg  100 mg Oral Once Jenness Corner, PA   100 mg at 03/15/11 2150  . oxyCODONE-acetaminophen (PERCOCET) 5-325 MG per tablet 1 tablet  1 tablet Oral Once Hilario Quarry, MD   1 tablet at 03/15/11 2201  . DISCONTD: aspirin tablet 325 mg  325 mg Oral STAT Hilario Quarry, MD      .  DISCONTD: nitroGLYCERIN (NITROSTAT) SL tablet 0.4 mg  0.4 mg Sublingual Q5 min PRN Hilario Quarry, MD       Medications Prior to Admission  Medication Sig Dispense Refill  . albuterol (PROVENTIL HFA;VENTOLIN HFA) 108 (90 BASE) MCG/ACT inhaler Inhale 2 puffs into the lungs every 6 (six) hours as needed. For bronchitis      . amLODipine (NORVASC) 10 MG tablet Take 10 mg by mouth daily.        . clopidogrel (PLAVIX) 75 MG tablet Take 75 mg by mouth daily.        . colchicine 0.6 MG tablet  Take 0.6 mg by mouth daily.       Marland Kitchen levothyroxine (SYNTHROID, LEVOTHROID) 112 MCG tablet Take 1 tablet (112 mcg total) by mouth daily.  30 tablet  6  . pravastatin (PRAVACHOL) 40 MG tablet Take 1 tablet (40 mg total) by mouth at bedtime.  30 tablet  3  . quinapril (ACCUPRIL) 40 MG tablet Take 1 tablet (40 mg total) by mouth at bedtime.  30 tablet  11  . nitroGLYCERIN (NITROSTAT) 0.4 MG SL tablet Place 0.4 mg under the tongue every 5 (five) minutes as needed.          Results for orders placed during the hospital encounter of 03/15/11 (from the past 48 hour(s))  CBC     Status: Normal   Collection Time   03/15/11  5:20 PM      Component Value Range Comment   WBC 9.0  4.0 - 10.5 (K/uL)    RBC 4.68  3.87 - 5.11 (MIL/uL)    Hemoglobin 13.7  12.0 - 15.0 (g/dL)    HCT 40.9  81.1 - 91.4 (%)    MCV 91.0  78.0 - 100.0 (fL)    MCH 29.3  26.0 - 34.0 (pg)    MCHC 32.2  30.0 - 36.0 (g/dL)    RDW 78.2  95.6 - 21.3 (%)    Platelets 254  150 - 400 (K/uL)   BASIC METABOLIC PANEL     Status: Abnormal   Collection Time   03/15/11  5:20 PM      Component Value Range Comment   Sodium 140  135 - 145 (mEq/L)    Potassium 3.5  3.5 - 5.1 (mEq/L)    Chloride 102  96 - 112 (mEq/L)    CO2 28  19 - 32 (mEq/L)    Glucose, Bld 67 (*) 70 - 99 (mg/dL)    BUN 18  6 - 23 (mg/dL)    Creatinine, Ser 0.86  0.50 - 1.10 (mg/dL)    Calcium 9.4  8.4 - 10.5 (mg/dL)    GFR calc non Af Amer 74 (*) >90 (mL/min)    GFR calc Af Amer 86 (*) >90 (mL/min)   APTT     Status: Normal   Collection Time   03/15/11  5:23 PM      Component Value Range Comment   aPTT 33  24 - 37 (seconds)   PROTIME-INR     Status: Normal   Collection Time   03/15/11  5:23 PM      Component Value Range Comment   Prothrombin Time 12.2  11.6 - 15.2 (seconds)    INR 0.89  0.00 - 1.49    POCT I-STAT TROPONIN I     Status: Abnormal   Collection Time   03/15/11  5:29 PM      Component Value Range Comment   Troponin i, poc 0.32 (*)  0.00 - 0.08  (ng/mL)    Comment NOTIFIED PHYSICIAN      Comment 3            URINALYSIS, ROUTINE W REFLEX MICROSCOPIC     Status: Abnormal   Collection Time   03/15/11  6:08 PM      Component Value Range Comment   Color, Urine YELLOW  YELLOW     APPearance CLOUDY (*) CLEAR     Specific Gravity, Urine 1.023  1.005 - 1.030     pH 5.5  5.0 - 8.0     Glucose, UA NEGATIVE  NEGATIVE (mg/dL)    Hgb urine dipstick SMALL (*) NEGATIVE     Bilirubin Urine NEGATIVE  NEGATIVE     Ketones, ur NEGATIVE  NEGATIVE (mg/dL)    Protein, ur NEGATIVE  NEGATIVE (mg/dL)    Urobilinogen, UA 0.2  0.0 - 1.0 (mg/dL)    Nitrite NEGATIVE  NEGATIVE     Leukocytes, UA LARGE (*) NEGATIVE    URINE MICROSCOPIC-ADD ON     Status: Normal   Collection Time   03/15/11  6:08 PM      Component Value Range Comment   Squamous Epithelial / LPF RARE  RARE     WBC, UA 21-50  <3 (WBC/hpf)    RBC / HPF 0-2  <3 (RBC/hpf)   CARDIAC PANEL(CRET KIN+CKTOT+MB+TROPI)     Status: Normal   Collection Time   03/15/11  6:34 PM      Component Value Range Comment   Total CK 105  7 - 177 (U/L)    CK, MB 2.2  0.3 - 4.0 (ng/mL)    Troponin I <0.30  <0.30 (ng/mL)    Relative Index 2.1  0.0 - 2.5     Dg Chest 2 View  03/15/2011  *RADIOLOGY REPORT*  Clinical Data: Chest pain  CHEST - 2 VIEW  Comparison:  08/06/2010  Findings: Cardiomediastinal silhouette is stable.  No acute infiltrate or pleural effusion.  No pulmonary edema.  Mild degenerative changes thoracic spine  IMPRESSION: .  No active disease.  No significant change.  Original Report Authenticated By: Natasha Mead, M.D.    Review of Systems  Constitutional: Negative for fever, chills and diaphoresis.  HENT: Negative.   Eyes: Negative.   Respiratory: Negative for shortness of breath and wheezing.        Has a chronic non productive cough  Gastrointestinal: Negative.   Genitourinary: Negative.   Musculoskeletal: Positive for back pain and joint pain.  Skin: Negative.   Neurological:  Negative.   Endo/Heme/Allergies: Negative.   Psychiatric/Behavioral: Negative.     Blood pressure 158/62, pulse 65, temperature 98.6 F (37 C), temperature source Oral, resp. rate 15, height 5\' 7"  (1.702 m), weight 230 lb (104.327 kg), SpO2 100.00%. Physical Exam  Constitutional: She is oriented to person, place, and time. No distress.       Obese  HENT:  Mouth/Throat: Oropharynx is clear and moist.  Eyes: Conjunctivae are normal. Pupils are equal, round, and reactive to light.  Neck: Normal range of motion. No JVD present. No thyromegaly present.  Cardiovascular: Normal rate, regular rhythm, S1 normal and S2 normal.  PMI is not displaced.  Exam reveals no gallop, no S3, no S4, no distant heart sounds, no friction rub and no decreased pulses.   Murmur heard.  Systolic murmur is present with a grade of 2/6  Pulses:      Dorsalis pedis pulses are 1+ on the right side, and  1+ on the left side.         Murmur radiates to the left neck.  Respiratory: Effort normal. No stridor.  GI: Soft. Bowel sounds are normal. There is no tenderness.  Musculoskeletal: Normal range of motion.  Neurological: She is alert and oriented to person, place, and time. She has normal strength. No cranial nerve deficit.  Skin: Skin is warm, dry and intact. She is not diaphoretic.  Psychiatric: She has a normal mood and affect. Her speech is normal and behavior is normal.     Assessment/Plan NSTEMI   Atypical chest pain and minimal troponin elevation in a patient with CAD s/p PCI. Her initial point of care troponin was positive at 0.3 so decision was made to treat her tentatively for NSTEMI. The regular lab troponin is however back and is < 0.3. At this time the plan is to continue to treat her for NSTEMI and if the next troponin comes back also < 0.3, will discontinue heparin drip and have her undergo a stress test.  It is conceivable that the other non critical lesions in her LAD and LCX have now become  symptomatic. If troponin continues to rise or remain positive, will consider proceeding straight to cardiac catheterization. In the meantime continue with  ASA Plavix Accupril Rosuvastatin Add metoprolol 25 mg po twice daily for now Heparin drip 2D echo Pharmacologic stress test v.s cardiac catheterization.     Grandville Silos 03/15/2011,

## 2011-03-15 NOTE — ED Provider Notes (Signed)
History     CSN: [id number]  Arrival date & time 03/15/11  1551   First MD Initiated Contact with Patient 03/15/11 1701      Chief Complaint  Patient presents with  . Chest Pain  . Dysuria    (Consider location/radiation/quality/duration/timing/severity/associated sxs/prior treatment) HPI  Patient presents to emergency department with 2 complaints with the first being a 2 day history of mild dysuria but she denies hematuria, abdominal pain, flank pain or fever with a second being concern of chest pain. Patient states she has a history of myocardial infarction with stent placement and is followed by Dr. Sanjuana Kava. She notes that she has had daily constant chest pain since being diagnosed with a heart attack however 3 days ago noted fleeting sharp left-sided chest pain but no recurrence of such pain however states she does have daily discomfort. Patient states this discomfort is unchanging and denies associated shortness of breath, nausea, vomiting, radiation of pain. Patient states it has been the same discomfort for a year.  Past Medical History  Diagnosis Date  . HYPOTHYROIDISM 07/23/2009  . HYPERLIPIDEMIA 07/23/2009  . HYPERTENSION 07/23/2009  . CAD 07/23/2009  . CAROTID STENOSIS 07/24/2009  . PVD 07/23/2009  . GRAVES' DISEASE, HX OF 07/23/2009  . VITAMIN D DEFICIENCY 03/15/2010  . TOBACCO ABUSE 03/15/2010  . CORONARY ATHEROSCLEROSIS NATIVE CORONARY ARTERY 04/01/2010  . FATIGUE / MALAISE 03/07/2010  . Myocardial infarction     Past Surgical History  Procedure Date  . Carotid stent     bilateral carotid artery disease post stent of left carotid  . Radioactive plaque insertion     Graves disease post radioactive treatment complicated hypothyroidism    Family History  Problem Relation Age of Onset  . Coronary artery disease Other   . Hypertension Other     History  Substance Use Topics  . Smoking status: Former Smoker    Quit date: 06/30/2009  . 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  Divorced, single, lives alone- 2 grown dtr nearby, 3 g-kds  . Alcohol Use:     OB History    Grav Para Term Preterm Abortions TAB SAB Ect Mult Living                  Review of Systems  All other systems reviewed and are negative.    Allergies  Review of patient's allergies indicates no known allergies.  Home Medications   Current Outpatient Rx  Name Route Sig Dispense Refill  . ALBUTEROL SULFATE HFA 108 (90 BASE) MCG/ACT IN AERS Inhalation Inhale 2 puffs into the lungs every 6 (six) hours as needed. For bronchitis    . AMLODIPINE BESYLATE 10 MG PO TABS Oral Take 10 mg by mouth daily.      . ASPIRIN 325 MG PO TABS Oral Take 325 mg by mouth daily.      Marland Kitchen CLOPIDOGREL BISULFATE 75 MG PO TABS Oral Take 75 mg by mouth daily.      . COLCHICINE 0.6 MG PO TABS Oral Take 0.6 mg by mouth daily.     Marland Kitchen LEVOTHYROXINE SODIUM 112 MCG PO TABS Oral Take 1 tablet (112 mcg total) by mouth daily. 30 tablet 6  . PRAVASTATIN SODIUM 40 MG PO TABS Oral Take 1 tablet (40 mg total) by mouth at bedtime. 30 tablet 3  . QUINAPRIL HCL 40 MG PO TABS Oral Take 1 tablet (  40 mg total) by mouth at bedtime. 30 tablet 11  . ROSUVASTATIN CALCIUM 40 MG PO TABS Oral Take 40 mg by mouth daily.      Marland Kitchen NITROGLYCERIN 0.4 MG SL SUBL Sublingual Place 0.4 mg under the tongue every 5 (five) minutes as needed.        BP 139/74  Pulse 77  Temp(Src) 98.7 F (37.1 C) (Oral)  Resp 20  SpO2 98%  Physical Exam  Nursing note and vitals reviewed. Constitutional: She is oriented to person, place, and time. She appears well-developed and well-nourished. No distress.  HENT:  Head: Normocephalic and atraumatic.  Eyes: Conjunctivae are normal.  Neck: Normal range of motion. Neck supple.  Cardiovascular: Normal rate, regular rhythm, normal heart sounds and intact distal pulses.  Exam reveals no gallop and no friction rub.    No murmur heard. Pulmonary/Chest: Effort normal and breath sounds normal. No respiratory distress. She has no wheezes. She has no rales. She exhibits no tenderness.  Abdominal: Bowel sounds are normal. She exhibits no distension and no mass. There is no tenderness. There is no rebound and no guarding.  Musculoskeletal: Normal range of motion. She exhibits no edema and no tenderness.  Neurological: She is alert and oriented to person, place, and time.  Skin: Skin is warm and dry. No rash noted. She is not diaphoretic. No erythema.  Psychiatric: She has a normal mood and affect.    ED Course  Procedures (including critical care time)  PO aspirin  Date: 03/15/2011  Rate: 83  Rhythm: normal sinus rhythm  QRS Axis: normal  Intervals: normal  ST/T Wave abnormalities: nonspecific T wave changes  Conduction Disutrbances:none  Narrative Interpretation: unchanged from Dec 21, 2010  Old EKG Reviewed: unchanged  Labs Reviewed  URINALYSIS, ROUTINE W REFLEX MICROSCOPIC - Abnormal; Notable for the following:    APPearance CLOUDY (*)    Hgb urine dipstick SMALL (*)    Leukocytes, UA LARGE (*)    All other components within normal limits  BASIC METABOLIC PANEL - Abnormal; Notable for the following:    Glucose, Bld 67 (*)    GFR calc non Af Amer 74 (*)    GFR calc Af Amer 86 (*)    All other components within normal limits  POCT I-STAT TROPONIN I - Abnormal; Notable for the following:    Troponin i, poc 0.32 (*)    All other components within normal limits  CBC  CARDIAC PANEL(CRET KIN+CKTOT+MB+TROPI)  URINE MICROSCOPIC-ADD ON  I-STAT TROPONIN I  APTT  PROTIME-INR  HEPARIN LEVEL (UNFRACTIONATED)  CBC   Dg Chest 2 View  03/15/2011  *RADIOLOGY REPORT*  Clinical Data: Chest pain  CHEST - 2 VIEW  Comparison:  08/06/2010  Findings: Cardiomediastinal silhouette is stable.  No acute infiltrate or pleural effusion.  No pulmonary edema.  Mild degenerative changes thoracic spine  IMPRESSION: .   No active disease.  No significant change.  Original Report Authenticated By: Natasha Mead, M.D.     1. NSTEMI (non-ST elevated myocardial infarction)   2. Urinary tract infection       MDM  [facility] consulted at 18:21 and on his way to [facility] ER to evaluate patient for NSTEMI. VSS.         Jenness Corner, [place] 03/15/11 2050   History/physical exam/procedure(s) were performed by non-physician practitioner and as supervising physician I was immediately available for consultation/collaboration. I have reviewed all notes and am in agreement with care and plan.   Duwayne Heck  Denny Levy, MD 03/18/11 [phone]

## 2011-03-15 NOTE — Progress Notes (Signed)
ANTICOAGULATION CONSULT NOTE - Initial Consult  Pharmacy Consult for Heparin Indication: NSTEMI  No Known Allergies  Patient Measurements: Height: 5\' 7"  (170.2 cm) Weight: 230 lb (104.327 kg) (Per pt estimate) IBW/kg (Calculated) : 61.6   Vital Signs: Temp: 98.7 F (37.1 C) (12/29 1624) Temp src: Oral (12/29 1624) BP: 139/74 mmHg (12/29 1624) Pulse Rate: 77  (12/29 1624)  Labs:  Basename 03/15/11 1834 03/15/11 1720  HGB -- 13.7  HCT -- 42.6  PLT -- 254  APTT -- --  LABPROT -- --  INR -- --  HEPARINUNFRC -- --  CREATININE -- 0.85  CKTOTAL 105 --  CKMB 2.2 --  TROPONINI <0.30 --   Estimated Creatinine Clearance: 89.6 ml/min (by C-G formula based on Cr of 0.85).  Medical History: Past Medical History  Diagnosis Date  . HYPOTHYROIDISM 07/23/2009  . HYPERLIPIDEMIA 07/23/2009  . HYPERTENSION 07/23/2009  . CAD 07/23/2009  . CAROTID STENOSIS 07/24/2009  . PVD 07/23/2009  . GRAVES' DISEASE, HX OF 07/23/2009  . VITAMIN D DEFICIENCY 03/15/2010  . TOBACCO ABUSE 03/15/2010  . CORONARY ATHEROSCLEROSIS NATIVE CORONARY ARTERY 04/01/2010  . FATIGUE / MALAISE 03/07/2010  . Myocardial infarction     Medications:  Scheduled:    . aspirin  324 mg Oral Once  . metoprolol  5 mg Intravenous Once  . DISCONTD: aspirin  325 mg Oral STAT   Infusions:   PRN: DISCONTD: nitroGLYCERIN  Assessment: 60 yo F here with NSTEMI. Lab in process of drawing baseline PT/PTT/INR now. Limited info available in chart at this time. Will use pt stated weight of 230 lbs.   Goal of Therapy:  Heparin level 0.3-0.7 units/ml   Plan:  1) Once PT/INR/PTT drawn, start heparin 4000units IV x1 bolus, then: 2) Heparin 1250 units/hr IV (12.19ml/hr) 3) Heparin level in 6 hours 4) Daily HL an CBC 5) F/U on actual pt weight when pt is weighed on floor.  Annia Belt 03/15/2011,8:30 PM

## 2011-03-15 NOTE — ED Notes (Signed)
Pt c/o chest pain that began two days ago abd has not let up. Described as sharp and does not radiate located sternal. Pt also c/o dysuria.

## 2011-03-16 ENCOUNTER — Other Ambulatory Visit: Payer: Self-pay

## 2011-03-16 DIAGNOSIS — I2510 Atherosclerotic heart disease of native coronary artery without angina pectoris: Principal | ICD-10-CM

## 2011-03-16 DIAGNOSIS — I10 Essential (primary) hypertension: Secondary | ICD-10-CM

## 2011-03-16 LAB — BASIC METABOLIC PANEL
BUN: 15 mg/dL (ref 6–23)
CO2: 25 mEq/L (ref 19–32)
Calcium: 8.6 mg/dL (ref 8.4–10.5)
Creatinine, Ser: 0.73 mg/dL (ref 0.50–1.10)

## 2011-03-16 LAB — PROTIME-INR: Prothrombin Time: 13.7 seconds (ref 11.6–15.2)

## 2011-03-16 LAB — CARDIAC PANEL(CRET KIN+CKTOT+MB+TROPI)
CK, MB: 2 ng/mL (ref 0.3–4.0)
Relative Index: INVALID (ref 0.0–2.5)
Troponin I: <0.3 ng/mL (ref <0.30)
Troponin I: <0.3 ng/mL (ref <0.30)
Troponin I: <0.3 ng/mL (ref <0.30)

## 2011-03-16 LAB — CBC
Hemoglobin: 13.4 g/dL (ref 12.0–15.0)
MCH: 30.2 pg (ref 26.0–34.0)
MCHC: 33 g/dL (ref 30.0–36.0)
Platelets: 222 10*3/uL (ref 150–400)
RBC: 4.43 MIL/uL (ref 3.87–5.11)

## 2011-03-16 LAB — LIPID PANEL
Cholesterol: 233 mg/dL — ABNORMAL HIGH (ref 0–200)
HDL: 56 mg/dL (ref >39)
LDL Cholesterol: 159 mg/dL — ABNORMAL HIGH (ref 0–99)
Triglycerides: 89 mg/dL (ref <150)

## 2011-03-16 LAB — HEPARIN LEVEL (UNFRACTIONATED): Heparin Unfractionated: 0.32 IU/mL (ref 0.30–0.70)

## 2011-03-16 MED ORDER — AMLODIPINE BESYLATE 10 MG PO TABS
10.0000 mg | ORAL_TABLET | Freq: Every day | ORAL | Status: DC
  Administered 2011-03-16 – 2011-03-18 (×3): 10 mg via ORAL
  Filled 2011-03-16 (×5): qty 1

## 2011-03-16 MED ORDER — ALBUTEROL SULFATE HFA 108 (90 BASE) MCG/ACT IN AERS
2.0000 | INHALATION_SPRAY | Freq: Four times a day (QID) | RESPIRATORY_TRACT | Status: DC | PRN
  Filled 2011-03-16: qty 6.7

## 2011-03-16 MED ORDER — ZOLPIDEM TARTRATE 5 MG PO TABS
10.0000 mg | ORAL_TABLET | Freq: Every evening | ORAL | Status: DC | PRN
  Administered 2011-03-16: 10 mg via ORAL
  Filled 2011-03-16: qty 1

## 2011-03-16 MED ORDER — NITROGLYCERIN 0.4 MG SL SUBL: 0.4000 mg | SUBLINGUAL_TABLET | SUBLINGUAL | Status: DC | PRN

## 2011-03-16 MED ORDER — CLOPIDOGREL BISULFATE 75 MG PO TABS
75.0000 mg | ORAL_TABLET | Freq: Every day | ORAL | Status: DC
  Administered 2011-03-16 – 2011-03-18 (×3): 75 mg via ORAL
  Filled 2011-03-16 (×3): qty 1

## 2011-03-16 MED ORDER — LEVOTHYROXINE SODIUM 112 MCG PO TABS
112.0000 ug | ORAL_TABLET | Freq: Every day | ORAL | Status: DC
  Administered 2011-03-16 – 2011-03-18 (×3): 112 ug via ORAL
  Filled 2011-03-16 (×4): qty 1

## 2011-03-16 MED ORDER — HEPARIN SOD (PORCINE) IN D5W 100 UNIT/ML IV SOLN
1500.0000 [IU]/h | INTRAVENOUS | Status: DC
  Administered 2011-03-16 – 2011-03-17 (×2): 1500 [IU]/h via INTRAVENOUS
  Filled 2011-03-16 (×4): qty 250

## 2011-03-16 MED ORDER — METOPROLOL TARTRATE 25 MG PO TABS
25.0000 mg | ORAL_TABLET | Freq: Two times a day (BID) | ORAL | Status: DC
  Administered 2011-03-16 – 2011-03-18 (×4): 25 mg via ORAL
  Filled 2011-03-16 (×8): qty 1

## 2011-03-16 MED ORDER — ONDANSETRON HCL 4 MG/2ML IJ SOLN: 4.0000 mg | Freq: Four times a day (QID) | INTRAMUSCULAR | Status: DC | PRN

## 2011-03-16 MED ORDER — ACETAMINOPHEN 325 MG PO TABS: 650.0000 mg | ORAL_TABLET | ORAL | Status: DC | PRN

## 2011-03-16 MED ORDER — LISINOPRIL 40 MG PO TABS
40.0000 mg | ORAL_TABLET | Freq: Every day | ORAL | Status: DC
  Administered 2011-03-16 – 2011-03-18 (×2): 40 mg via ORAL
  Filled 2011-03-16 (×3): qty 1

## 2011-03-16 MED ORDER — SULFAMETHOXAZOLE-TMP DS 800-160 MG PO TABS
1.0000 | ORAL_TABLET | Freq: Two times a day (BID) | ORAL | Status: DC
  Administered 2011-03-16 – 2011-03-17 (×3): 1 via ORAL
  Filled 2011-03-16 (×8): qty 1

## 2011-03-16 MED ORDER — QUINAPRIL HCL 10 MG PO TABS
40.0000 mg | ORAL_TABLET | Freq: Every day | ORAL | Status: DC
  Filled 2011-03-16 (×2): qty 4

## 2011-03-16 MED ORDER — ACETAMINOPHEN 325 MG PO TABS
650.0000 mg | ORAL_TABLET | ORAL | Status: DC | PRN
  Administered 2011-03-16: 650 mg via ORAL
  Filled 2011-03-16: qty 2

## 2011-03-16 MED ORDER — COLCHICINE 0.6 MG PO TABS
0.6000 mg | ORAL_TABLET | Freq: Every day | ORAL | Status: DC
  Administered 2011-03-16 – 2011-03-18 (×3): 0.6 mg via ORAL
  Filled 2011-03-16 (×5): qty 1

## 2011-03-16 MED ORDER — ASPIRIN 325 MG PO TABS
325.0000 mg | ORAL_TABLET | Freq: Every day | ORAL | Status: DC
  Administered 2011-03-16 – 2011-03-18 (×2): 325 mg via ORAL
  Filled 2011-03-16 (×4): qty 1

## 2011-03-16 MED ORDER — ROSUVASTATIN CALCIUM 40 MG PO TABS
40.0000 mg | ORAL_TABLET | Freq: Every day | ORAL | Status: DC
  Administered 2011-03-16: 40 mg via ORAL
  Filled 2011-03-16 (×2): qty 1

## 2011-03-16 NOTE — ED Notes (Signed)
Report called Marissa, rn.

## 2011-03-16 NOTE — Progress Notes (Addendum)
Caitlyn [name]  60 y.o.  female  Subjective: Sxs resolved; denies chest discomfort or dyspnea.  Main reason for coming to ED was symptoms of UTI.  Allergy: Review of patient's allergies indicates no known allergies.  Objective: Vital signs in last 24 hours: Temp:  [98.3 F (36.8 C)-98.7 F (37.1 C)] 98.3 F (36.8 C) (12/30 0757) Pulse Rate:  [61-96] 64  (12/30 0757) Resp:  [15-20] 16  (12/30 0757) BP: (117-158)/(50-83) 133/50 mmHg (12/30 0757) SpO2:  [98 %-100 %] 100 % (12/30 0757) Weight:  [104.327 kg (230 lb)] 230 lb (104.327 kg) (12/29 1624)  230 lb (104.327 kg) Body mass index is 36.02 kg/(m^2).  Weight change:     Intake/Output from previous day:    General- Well developed; no acute distress; overwt. Neck- No JVD, left carotid bruits Lungs- clear lung fields; normal I:E ratio Cardiovascular- normal PMI; normal S1 and S2 Abdomen- normal bowel sounds; soft and non-tender without masses or organomegaly Skin- Warm, no significant lesions Extremities- Nl distal pulses; no edema  Lab Results: Cardiac Markers:   Basename 03/16/11 0453 03/16/11 0300  TROPONINI <0.30 <0.30   CBC:   Basename 03/16/11 0453 03/15/11 1720  WBC 9.5 9.0  HGB 13.4 13.7  HCT 40.6 42.6  PLT 222 254   BMET:  Basename 03/16/11 0453 03/15/11 1720  NA 139 140  K 3.7 3.5  CL 105 102  CO2 25 28  GLUCOSE 93 67*  BUN 15 18  CREATININE 0.73 0.85  CALCIUM 8.6 9.4   Hepatic Function:  No results found for this basename: PROT,ALBUMIN,AST,ALT,ALKPHOS,BILITOT,BILIDIR,IBILI in the last 72 hours GFR:  Estimated Creatinine Clearance: 95.2 ml/min (by C-G formula based on Cr of 0.73). Lipids:  No results found for this basename: CHOL,TRIG,HDL,LDL in the last 72 hours  EKG:  12/29-normal sinus rhythm, left atrial abnormality, modest ST segment depression in the inferior leads, shallow T-wave inversions in V4-V6, delayed R wave progression              12/30: Inferior ST-T wave abnormalities less  prominent, sinus rhythm and left atrial abnormality persist, T wave inversion no longer present.  Imaging Studies/Results: Dg Chest 2 View  03/15/2011  *RADIOLOGY REPORT*  Clinical Data: Chest pain  CHEST - 2 VIEW  Comparison:  08/06/2010  Findings: Cardiomediastinal silhouette is stable.  No acute infiltrate or pleural effusion.  No pulmonary edema.  Mild degenerative changes thoracic spine  IMPRESSION: .  No active disease.  No significant change.  Original Report Authenticated By: Natasha Mead, M.D.    Imaging: Imaging results have been reviewed  Medications: I have reviewed the patient's current medications.  Infusions:     . heparin 12 Units/kg/hr (03/15/11 2127)    Principal Problem:  *ACS (acute coronary syndrome) Active Problems:  CORONARY ATHEROSCLEROSIS NATIVE CORONARY ARTERY   Assessment/Plan: Chest pain: Repeat cardiac markers are negative. Initial positive value in the emergency department was likely erroneous; however, review of EKGs reveals dynamic ST-T wave changes.  Initial chest discomfort was atypical and only semi-acute, having been noted over the past week. I suspect that she is not experiencing an acute coronary syndrome, but observation overnight with a stress test in the morning is the safest approach.  UTI: Patient received a single dose of nitrofurantoin; a 3 day course of trimethoprim sulfamethoxazole will be given.  Hypertension: Blood pressure control is good on current regimen, which will be continued.   LOS: 1 day   Strawberry Bing 03/16/2011, 10:43 AM

## 2011-03-16 NOTE — ED Notes (Signed)
Patient transferred to floor. Left unit on stretcher pushed by nurse tech accompanied by family members. Left in good condition.

## 2011-03-16 NOTE — ED Notes (Signed)
Pt received in TCU. Alert and oriented x 4. On heparin gtt at 12.84ml/hr. In no acute distress at this time. Sitting up in bed eating breakfast.

## 2011-03-16 NOTE — Progress Notes (Signed)
Pt scheduled to be transferred to [facility]. Ambulance in to transfer patient to [facility]. EMTALA form not filled. MD notified. Said to have patient remain at [facility] long and not be transferred to [facility].

## 2011-03-16 NOTE — Progress Notes (Signed)
ANTICOAGULATION CONSULT NOTE - Follow Up Consult  Pharmacy Consult for Heparin Indication: chest pain/ACS  No Known Allergies  Patient Measurements: Height: 5\' 7"  (170.2 cm) Weight: 230 lb (104.327 kg) (Per pt estimate) IBW/kg (Calculated) : 61.6  Adjusted Body Weight:   Vital Signs: Temp: 98.5 F (36.9 C) (12/30 0343) Temp src: Oral (12/30 0343) BP: 117/83 mmHg (12/30 0343) Pulse Rate: 61  (12/30 0343)  Labs:  Basename 03/16/11 0453 03/16/11 0300 03/15/11 1834 03/15/11 1723 03/15/11 1720  HGB 13.4 -- -- -- 13.7  HCT 40.6 -- -- -- 42.6  PLT 222 -- -- -- 254  APTT -- -- -- 33 --  LABPROT 13.7 -- -- 12.2 --  INR 1.03 -- -- 0.89 --  HEPARINUNFRC 0.32 -- -- -- --  CREATININE 0.73 -- -- -- 0.85  CKTOTAL 94 93 105 -- --  CKMB 2.0 2.1 2.2 -- --  TROPONINI <0.30 <0.30 <0.30 -- --   Estimated Creatinine Clearance: 95.2 ml/min (by C-G formula based on Cr of 0.73).   Medications:  Infusions:    . heparin 12 Units/kg/hr (03/15/11 2127)    Assessment: Patient with 1st level at goal.  No issues per RN Goal of Therapy:  Heparin level 0.3-0.7 units/ml   Plan:  Continue heparin at current rate and recheck level at 1200  Caitlyn [name], Caitlyn [name] 03/16/2011,6:55 AM

## 2011-03-16 NOTE — Progress Notes (Signed)
Report called to [place], rn at [facility] 2000.

## 2011-03-16 NOTE — Progress Notes (Signed)
ANTICOAGULATION CONSULT NOTE - Follow Up Consult  Pharmacy Consult for Heparin Indication: Chest Pain/ACS  No Known Allergies  Patient Measurements: Height: 5\' 7"  (170.2 cm) Weight: 257 lb 15 oz (117 kg) IBW/kg (Calculated) : 61.6   Vital Signs: Temp: 98.6 F (37 C) (12/30 1602) Temp src: Oral (12/30 1602) BP: 136/79 mmHg (12/30 1602) Pulse Rate: 59  (12/30 1602)  Labs:  Basename 03/16/11 1936 03/16/11 1200 03/16/11 0453 03/16/11 0300 03/15/11 1723 03/15/11 1720  HGB -- -- 13.4 -- -- 13.7  HCT -- -- 40.6 -- -- 42.6  PLT -- -- 222 -- -- 254  APTT -- -- -- -- 33 --  LABPROT -- -- 13.7 -- 12.2 --  INR -- -- 1.03 -- 0.89 --  HEPARINUNFRC 0.47 0.25* 0.32 -- -- --  CREATININE -- -- 0.73 -- -- 0.85  CKTOTAL -- 83 94 93 -- --  CKMB -- 2.0 2.0 2.1 -- --  TROPONINI -- <0.30 <0.30 <0.30 -- --   Estimated Creatinine Clearance: 101.4 ml/min (by C-G formula based on Cr of 0.73).   Medications:  Scheduled:    . amLODipine  10 mg Oral Daily  . aspirin  325 mg Oral Daily  . clopidogrel  75 mg Oral Daily  . colchicine  0.6 mg Oral Daily  . heparin  4,000 Units Intravenous Once  . levothyroxine  112 mcg Oral Daily  . metoprolol tartrate  25 mg Oral BID  . nitrofurantoin (macrocrystal-monohydrate)  100 mg Oral Once  . oxyCODONE-acetaminophen  1 tablet Oral Once  . quinapril  40 mg Oral QHS  . rosuvastatin  40 mg Oral Daily  . sulfamethoxazole-trimethoprim  1 tablet Oral Q12H   Infusions:    . heparin 1,500 Units/hr (03/16/11 1743)  . DISCONTD: heparin 12 Units/kg/hr (03/16/11 1308)   PRN: acetaminophen, acetaminophen, albuterol, nitroGLYCERIN, ondansetron (ZOFRAN) IV, ondansetron (ZOFRAN) IV, zolpidem  Assessment: 60 yo F on IV heparin for chest pain. Repeat heparin level in goal range 0.3-0.7. No bleeding reported in chart notes. Will continue current heparin dose and recheck level in 6 hours to confirm therapeutic dose.   Goal of Therapy:  Heparin level 0.3-0.7  units/ml   Plan:  1)  Continue heparin at 1500 units/hr (15 ml/hr) 2)  Recheck heparin level in 6 hours. (apprx 2am)  Annia Belt 03/16/2011,9:21 PM

## 2011-03-16 NOTE — ED Notes (Signed)
Medicated patient with AM med.

## 2011-03-16 NOTE — ED Notes (Signed)
Report given to EMS

## 2011-03-16 NOTE — ED Notes (Signed)
Heparin gtt increased to 15 ml/hr.

## 2011-03-16 NOTE — Progress Notes (Signed)
ANTICOAGULATION CONSULT NOTE - Follow Up Consult  Pharmacy Consult for IV heparin Indication: Chest pain/ACS  No Known Allergies  Patient Measurements: Height: 5\' 7"  (170.2 cm) Weight: 230 lb (104.327 kg) (Per pt estimate) IBW/kg (Calculated) : 61.6    Vital Signs: Temp: 98.2 F (36.8 C) (12/30 1139) Temp src: Oral (12/30 1139) BP: 133/80 mmHg (12/30 1139) Pulse Rate: 64  (12/30 0757)  Labs:  Basename 03/16/11 1200 03/16/11 0453 03/16/11 0300 03/15/11 1834 03/15/11 1723 03/15/11 1720  HGB -- 13.4 -- -- -- 13.7  HCT -- 40.6 -- -- -- 42.6  PLT -- 222 -- -- -- 254  APTT -- -- -- -- 33 --  LABPROT -- 13.7 -- -- 12.2 --  INR -- 1.03 -- -- 0.89 --  HEPARINUNFRC 0.25* 0.32 -- -- -- --  CREATININE -- 0.73 -- -- -- 0.85  CKTOTAL -- 94 93 105 -- --  CKMB -- 2.0 2.1 2.2 -- --  TROPONINI -- <0.30 <0.30 <0.30 -- --   Estimated Creatinine Clearance: 95.2 ml/min (by C-G formula based on Cr of 0.73).   Medications:  Scheduled:    . aspirin  324 mg Oral Once  . heparin  4,000 Units Intravenous Once  . metoprolol  5 mg Intravenous Once  . metoprolol tartrate  25 mg Oral BID  . nitrofurantoin (macrocrystal-monohydrate)  100 mg Oral Once  . oxyCODONE-acetaminophen  1 tablet Oral Once  . sulfamethoxazole-trimethoprim  1 tablet Oral Q12H  . DISCONTD: aspirin  325 mg Oral STAT   Infusions:    . heparin 12 Units/kg/hr (03/16/11 1308)   PRN: acetaminophen, ondansetron (ZOFRAN) IV, DISCONTD: nitroGLYCERIN  Assessment: 60 yo with NSTEMI.  Second heparin level after initiation subtherapeutic.  Will increase heparin infusion rate and recheck level.  No bleeding/complications noted. CBC stable today  Goal of Therapy:  Heparin level 0.3-0.7   Plan:  1.)Increase heparin to 1500 units/hr = 15 ml/hr 2.) repeat 6 hr heparin level.   Laurana Magistro, Loma Messing 03/16/2011,1:10 PM

## 2011-03-17 ENCOUNTER — Inpatient Hospital Stay (HOSPITAL_COMMUNITY): Payer: Self-pay

## 2011-03-17 ENCOUNTER — Encounter (HOSPITAL_COMMUNITY): Payer: Self-pay | Admitting: Cardiology

## 2011-03-17 ENCOUNTER — Encounter (HOSPITAL_COMMUNITY)
Admission: EM | Disposition: A | Discharge status: Home / Self Care | Payer: Self-pay | Source: Home / Self Care | Attending: Cardiology

## 2011-03-17 DIAGNOSIS — I2510 Atherosclerotic heart disease of native coronary artery without angina pectoris: Secondary | ICD-10-CM

## 2011-03-17 DIAGNOSIS — R079 Chest pain, unspecified: Secondary | ICD-10-CM

## 2011-03-17 HISTORY — PX: LEFT HEART CATHETERIZATION WITH CORONARY ANGIOGRAM: SHX5451

## 2011-03-17 LAB — CBC
HCT: 38.8 % (ref 36.0–46.0)
Hemoglobin: 12.8 g/dL (ref 12.0–15.0)
MCV: 91.1 fL (ref 78.0–100.0)
RBC: 4.26 MIL/uL (ref 3.87–5.11)
WBC: 9 10*3/uL (ref 4.0–10.5)

## 2011-03-17 LAB — URINE CULTURE: Colony Count: 10000

## 2011-03-17 SURGERY — LEFT HEART CATHETERIZATION WITH CORONARY ANGIOGRAM
Anesthesia: LOCAL

## 2011-03-17 MED ORDER — ASPIRIN 81 MG PO CHEW
324.0000 mg | CHEWABLE_TABLET | ORAL | Status: AC
  Administered 2011-03-17: 324 mg via ORAL

## 2011-03-17 MED ORDER — HEPARIN SODIUM (PORCINE) 1000 UNIT/ML IJ SOLN
INTRAMUSCULAR | Status: AC
  Filled 2011-03-17: qty 1

## 2011-03-17 MED ORDER — MIDAZOLAM HCL 2 MG/2ML IJ SOLN
INTRAMUSCULAR | Status: AC
  Filled 2011-03-17: qty 2

## 2011-03-17 MED ORDER — LIDOCAINE HCL (PF) 1 % IJ SOLN
INTRAMUSCULAR | Status: AC
  Filled 2011-03-17: qty 30

## 2011-03-17 MED ORDER — FENTANYL CITRATE 0.05 MG/ML IJ SOLN
INTRAMUSCULAR | Status: AC
  Filled 2011-03-17: qty 2

## 2011-03-17 MED ORDER — HEPARIN (PORCINE) IN NACL 2-0.9 UNIT/ML-% IJ SOLN
INTRAMUSCULAR | Status: AC
  Filled 2011-03-17: qty 2000

## 2011-03-17 MED ORDER — NITROGLYCERIN 0.2 MG/ML ON CALL CATH LAB
INTRAVENOUS | Status: AC
  Filled 2011-03-17: qty 1

## 2011-03-17 MED ORDER — SODIUM CHLORIDE 0.9 % IV SOLN
INTRAVENOUS | Status: DC
  Administered 2011-03-17: 14:00:00 via INTRAVENOUS

## 2011-03-17 MED ORDER — ACETAMINOPHEN 325 MG PO TABS
650.0000 mg | ORAL_TABLET | ORAL | Status: DC | PRN
  Administered 2011-03-17: 650 mg via ORAL
  Filled 2011-03-17: qty 2

## 2011-03-17 MED ORDER — TECHNETIUM TC 99M TETROFOSMIN IV KIT
10.0000 | PACK | Freq: Once | INTRAVENOUS | Status: AC | PRN
  Administered 2011-03-17: 10 via INTRAVENOUS

## 2011-03-17 MED ORDER — ONDANSETRON HCL 4 MG/2ML IJ SOLN: 4.0000 mg | Freq: Four times a day (QID) | INTRAMUSCULAR | Status: DC | PRN

## 2011-03-17 MED ORDER — SODIUM CHLORIDE 0.9 % IV SOLN
INTRAVENOUS | Status: AC
  Administered 2011-03-17: 22:00:00 via INTRAVENOUS

## 2011-03-17 MED ORDER — VERAPAMIL HCL 2.5 MG/ML IV SOLN
INTRAVENOUS | Status: AC
  Filled 2011-03-17: qty 2

## 2011-03-17 NOTE — Progress Notes (Signed)
Heparin level 0.66 at goal for 2nd time. Will continue drip at current rate and follow up with am labs. Luetta Nutting PharmD, BCPS  03/17/2011, 3:14 AM

## 2011-03-17 NOTE — Interval H&P Note (Signed)
History and Physical Interval Note:  03/17/2011 4:49 PM  Ms. Lococo is well known to me.  She had prior stenting of the RCA with overlapping second generation stents.  She has cerebrovascular disease.  She has had a limited stress dobutamine with ECG changes.  Dr. Graciela Husbands recommended cardiac cath study.  Old studies are reviewed.    IVERY MICHALSKI  has presented today for surgery, with the diagnosis of Chest pain  The various methods of treatment have been discussed with the patient. After consideration of risks, benefits and other options for treatment, the patient has consented to  Procedure(s): LEFT HEART CATHETERIZATION WITH CORONARY ANGIOGRAM as a surgical intervention .  The patients' history has been reviewed, patient examined, no change in status, stable for surgery.  I have reviewed the patients' chart and labs.  Questions were answered to the patient's satisfaction.     Shawnie Pons

## 2011-03-17 NOTE — Progress Notes (Signed)
Caitlyn [name]  60 y.o.  female  Subjective: Sxs resolved; denies chest discomfort or dyspnea.  Main reason for coming to ED was symptoms of UTI.  Allergy: Review of patient's allergies indicates no known allergies.  Objective: Vital signs in last 24 hours: Temp:  [98.2 F (36.8 C)-98.8 F (37.1 C)] 98.8 F (37.1 C) (12/31 0446) Pulse Rate:  [59-72] 60  (12/31 0446) Resp:  [16-18] 18  (12/31 0446) BP: (133-144)/(50-80) 144/78 mmHg (12/31 0446) SpO2:  [95 %-100 %] 95 % (12/31 0446) Weight:  [117 kg (257 lb 15 oz)-119 kg (262 lb 5.6 oz)] 262 lb 5.6 oz (119 kg) (12/31 0446)  262 lb 5.6 oz (119 kg) Body mass index is 41.09 kg/(m^2).  Weight change: 12.673 kg (27 lb 15 oz) Last BM Date: 03/15/11  Intake/Output from previous day: 12/30 0701 - 12/31 0700 In: 951.6 [P.O.:680; I.V.:271.6] Out: 2 [Urine:2]  General- Well developed; no acute distress; overwt. Neck- No JVD, left carotid bruits Lungs- clear lung fields; normal I:E ratio Cardiovascular- normal PMI; normal S1 and S2 Abdomen- normal bowel sounds; soft and non-tender without masses or organomegaly Skin- Warm, no significant lesions Extremities- Nl distal pulses; no edema  Lab Results: Cardiac Markers:    Basename 03/16/11 1200 03/16/11 0453  TROPONINI <0.30 <0.30   CBC:    Basename 03/17/11 0150 03/16/11 0453  WBC 9.0 9.5  HGB 12.8 13.4  HCT 38.8 40.6  PLT 234 222   BMET:   Basename 03/16/11 0453 03/15/11 1720  NA 139 140  K 3.7 3.5  CL 105 102  CO2 25 28  GLUCOSE 93 67*  BUN 15 18  CREATININE 0.73 0.85  CALCIUM 8.6 9.4   Hepatic Function:  No results found for this basename: PROT,ALBUMIN,AST,ALT,ALKPHOS,BILITOT,BILIDIR,IBILI in the last 72 hours GFR:  Estimated Creatinine Clearance: 102.4 ml/min (by C-G formula based on Cr of 0.73). Lipids:    Basename 03/16/11 0453  CHOL 233*  TRIG 89  HDL 56    EKG:  12/29-normal sinus rhythm, left atrial abnormality, modest ST segment depression in the  inferior leads, shallow T-wave inversions in V4-V6, delayed R wave progression              12/30: Inferior ST-T wave abnormalities less prominent, sinus rhythm and left atrial abnormality persist, T wave inversion no longer present.  Imaging Studies/Results: Dg Chest 2 View  03/15/2011  *RADIOLOGY REPORT*  Clinical Data: Chest pain  CHEST - 2 VIEW  Comparison:  08/06/2010  Findings: Cardiomediastinal silhouette is stable.  No acute infiltrate or pleural effusion.  No pulmonary edema.  Mild degenerative changes thoracic spine  IMPRESSION: .  No active disease.  No significant change.  Original Report Authenticated By: Natasha Mead, M.D.    Imaging: Imaging results have been reviewed  Medications: I have reviewed the patient's current medications.  Infusions:      . heparin 1,500 Units/hr (03/16/11 1743)  . DISCONTD: heparin 12 Units/kg/hr (03/16/11 1308)    Principal Problem:  *ACS (acute coronary syndrome) Active Problems:  CORONARY ATHEROSCLEROSIS NATIVE CORONARY ARTERY   Assessment/Plan: Chest pain: Repeat cardiac markers are negative. Initial positive value in the emergency department was likely erroneous; however, review of EKGs reveals dynamic ST-T wave changes.  Initial chest discomfort was atypical and only semi-acute, having been noted over the past week. I suspect that she is not experiencing an acute coronary syndrome. Plan for stress test today with dobutamine echo. If normal can be discharged today.  CAD s/p stent  of RCA in 4/11. 60-70% disease noted in LAD and LCX at that time.  UTI: Patient received a single dose of nitrofurantoin; a 3 day course of trimethoprim sulfamethoxazole will be given.  Hypertension: Blood pressure control is good on current regimen, which will be continued.   LOS: 2 days   Thedora Hinders, [facility] 03/17/2011, 7:47 AM

## 2011-03-17 NOTE — Interval H&P Note (Signed)
History and Physical Interval Note:  03/17/2011 4:52 PM  Caitlyn [name]  has presented today for surgery, with the diagnosis of Chest pain  The various methods of treatment have been discussed with the patient and family. After consideration of risks, benefits and other options for treatment, the patient has consented to  Procedure(s): LEFT HEART CATHETERIZATION WITH CORONARY ANGIOGRAM as a surgical intervention .  The patients' history has been reviewed, patient examined, no change in status, stable for surgery.  I have reviewed the patients' chart and labs.  Questions were answered to the patient's satisfaction.     Charlton Haws 4:52 PM 03/17/2011

## 2011-03-17 NOTE — Op Note (Signed)
Cardiac Catheterization Procedure Note  Name: ATALIE OROS MRN: [id number] DOB: 1950-07-24  Procedure: Left Heart Cath, Selective Coronary Angiography, LV angiography  Indication: The patient is well known to me.  She has prior stenting of the RCA with overlapping DES.  Now has recurrent chest pain, possibly related to bronchitis.  However, Dr. Graciela Husbands was concerned about her stress imaging, and ordered a diagnostic catheterization.  She was agreeable to proceed.     Procedural details: The right groin was prepped, draped, and anesthetized with 1% lidocaine. Using modified Seldinger technique, a 5 French sheath was introduced into the right femoral artery. Standard Judkins catheters were used for coronary angiography and left ventriculography. Catheter exchanges were performed over a guidewire. There were no immediate procedural complications. The patient was transferred to the post catheterization recovery area for further monitoring.  Procedural Findings: Hemodynamics:  AO 160/75 (108) LV 159/10 No sig gradient on pullback.     Coronary angiography: Coronary dominance: right  Left mainstem: No significant disease  Left anterior descending (LAD): 30-40% at takeoff of D2, and 30-40% more distally.  No critical narrowing.  Studies were compared to old films.  ? Minimal progression of disease in LAD.  Left circumflex (LCx): Provides 2 OM branches.  OM2 has segmental plaque of 40-50%, similar to prior study.  Right coronary artery (RCA): Has previously placed overlapping DES (Promus) in the mid RCA.  20% narrowing prior to stent.  No in stent restenosis noted, with widely patent stents.  10-20% eccentric plaque in distal vessel prior to smaller caliber PDA takeoff.  PLA is also relatively small.    Left ventriculography: Ventricular ectopy, so no sinus beats.  However, hyperdynamic post PVC function with EF >70%.  No WMA on post beats.  Final Conclusions:   1.  Continued patency of the  RCA DES stents from 2011 2.  Minimal LAD progression, no critical disease involving the LAD or LCX.  3.  Preserved overall LV systolic function.  Recommendations: Medical management. DC tomorrow from cardiac standpoint if stable.    Shawnie Pons 03/17/2011, 6:07 PM

## 2011-03-17 NOTE — Progress Notes (Signed)
NUCLEAR STRESS TESTING  Note that Dr. Elvis Coil rounding note from today specifies a dobutamine echo - this was clarified with him & it was determined that the patient should undergo Exercise Myoview instead. Written consent was obtained.  Pre Test: No complaints. No CP/SOB. EKG NSR without acute changes. VSS.  During Exercise Portion of Test: Patient had marked dyspnea in stage 2. Occasional PVCs with exercise then two sets of ventricular couplets at max exercise separated by sinus beats. Difficult to gauge ST segments due to artifact but there does appear to be ST segment depression inferior/laterally. BP 222 systolic and target HR not likely to be reached (upper 120's-low 130's with dyspnea limiting) so exercise was aborted.  Post Test: Patient felt better quickly. Brief ventricular bigeminy in recovery. About 2-3 minutes into recovery, had TWI II, III, avF & V4-V6 which is new from baseline EKG with 0.15mm ST depression avF & V5. Blood pressure came back down & VS remained stable. I discussed case with Dr. [place] & Dr. Graciela Husbands. Dr. Graciela Husbands came down to see patient and review EKG before proceeding further.  Dayna Dunn PA-C  Have reviewed history and tracing with DD;   Non revascularized LAD and Cx with RCA stent, non compliance with LDL 160 and episodic chest pain.  ECG on admission showed TW inversions which resolved.  Treadmill showed recovery STdepression 1-74mm in V4-V6 and 2,3,F  I am concerned about  The likely progressive nature of her CAD   She is agreeable to proceeding with catheterization  Berton Mount 03/17/2011

## 2011-03-17 NOTE — H&P (View-Only) (Signed)
NUCLEAR STRESS TESTING  Note that Dr. Jordan's rounding note from today specifies a dobutamine echo - this was clarified with him & it was determined that the patient should undergo Exercise Myoview instead. Written consent was obtained.  Pre Test: No complaints. No CP/SOB. EKG NSR without acute changes. VSS.  During Exercise Portion of Test: Patient had marked dyspnea in stage 2. Occasional PVCs with exercise then two sets of ventricular couplets at max exercise separated by sinus beats. Difficult to gauge ST segments due to artifact but there does appear to be ST segment depression inferior/laterally. BP 222 systolic and target HR not likely to be reached (upper 120's-low 130's with dyspnea limiting) so exercise was aborted.  Post Test: Patient felt better quickly. Brief ventricular bigeminy in recovery. About 2-3 minutes into recovery, had TWI II, III, avF & V4-V6 which is new from baseline EKG with 0.5mm ST depression avF & V5. Blood pressure came back down & VS remained stable. I discussed case with Dr. Jordan & Dr. Ettore Trebilcock. Dr. Marjorie Deprey came down to see patient and review EKG before proceeding further.  Dayna Dunn PA-C  Have reviewed history and tracing with DD;   Non revascularized LAD and Cx with RCA stent, non compliance with LDL 160 and episodic chest pain.  ECG on admission showed TW inversions which resolved.  Treadmill showed recovery STdepression 1-2mm in V4-V6 and 2,3,F  I am concerned about  The likely progressive nature of her CAD   She is agreeable to proceeding with catheterization  Steve Neda Willenbring 03/17/2011  

## 2011-03-18 DIAGNOSIS — N390 Urinary tract infection, site not specified: Secondary | ICD-10-CM | POA: Diagnosis present

## 2011-03-18 LAB — CBC
HCT: 39.7 % (ref 36.0–46.0)
Hemoglobin: 13.2 g/dL (ref 12.0–15.0)
MCH: 30.6 pg (ref 26.0–34.0)
MCHC: 33.2 g/dL (ref 30.0–36.0)
RDW: 14.6 % (ref 11.5–15.5)

## 2011-03-18 LAB — BASIC METABOLIC PANEL
BUN: 15 mg/dL (ref 6–23)
Chloride: 109 mEq/L (ref 96–112)
GFR calc Af Amer: 71 mL/min — ABNORMAL LOW (ref >90)
Glucose, Bld: 91 mg/dL (ref 70–99)
Potassium: 4.1 mEq/L (ref 3.5–5.1)
Sodium: 139 mEq/L (ref 135–145)

## 2011-03-18 MED ORDER — AMLODIPINE BESYLATE 10 MG PO TABS: 10.0000 mg | ORAL_TABLET | Freq: Every day | ORAL | Status: DC

## 2011-03-18 MED ORDER — ZOLPIDEM TARTRATE 10 MG PO TABS: 10.0000 mg | ORAL_TABLET | Freq: Every evening | ORAL | Status: DC | PRN

## 2011-03-18 MED ORDER — AMOXICILLIN 250 MG PO CAPS
250.0000 mg | ORAL_CAPSULE | Freq: Three times a day (TID) | ORAL | Status: DC
  Administered 2011-03-18: 250 mg via ORAL
  Filled 2011-03-18 (×3): qty 1

## 2011-03-18 MED ORDER — METOPROLOL TARTRATE 25 MG PO TABS: 25.0000 mg | ORAL_TABLET | Freq: Two times a day (BID) | ORAL | Status: DC

## 2011-03-18 MED ORDER — AMOXICILLIN 250 MG PO CAPS: 250.0000 mg | ORAL_CAPSULE | Freq: Three times a day (TID) | ORAL | Status: AC

## 2011-03-18 NOTE — Discharge Summary (Signed)
Physician Discharge Summary  Patient ID: CHANTIL BARI MRN: [id number] DOB/AGE: [birth year] 61 y.o.  Admit date: 03/15/2011 Discharge date: 03/18/2011  Primary Discharge Diagnosis 1. Acute Coronary Syndrome  Secondary Discharge Diagnosis: 1. CAD: Native disease with medical management 2. Carotid Stenosis 3. Hyperlipidemia 4. UTI  Significant Diagnostic Studies: 1. Cardiac Catheterization 03/17/2011 Final Conclusions:  1. Continued patency of the RCA DES stents from 2011  2. Minimal LAD progression, no critical disease involving the LAD or LCX.  3. Preserved overall LV systolic function.  2. Nuclear Stress test:03/17/2011 Have reviewed history and tracing with DD; Non revascularized LAD and Cx with RCA stent, non compliance with LDL 160 and episodic chest pain. ECG on admission showed TW inversions which resolved. Treadmill showed recovery STdepression 1-26mm in V4-V6 and 2,3,F I am concerned about The likely progressive nature of her CAD She is agreeable to proceeding with catheterization  Berton Mount 03/17/2011    Hospital Course: Mrs. Sheets presents to the emergency room with complaints of chest discomfort on 03/15/2011 patient has a known history of CAD with non-ST elevated MI in April of 2011 revealing a 90% RCA lesion with a 6070% lesion in the LAD and left circumflex. The RCA had PCI at that time. Patient also complained of some shortness of breath with walking and Dysuria. The patient was admitted started on heparin and treated for UTI.Cardiac markers are found to be negative her EKGs didn't reveal dynamic ST-T wave changes. The patient was scheduled for a stress test on 03/17/2011. As above the patient had chest discomfort during her stress test and also ST depression1-2  millimeters in V4 V6 III and aVF.She subsequently had cardiac catheterization the same day by Dr. Elijah Birk sticking. Revealed continued patency of the RCA drug-eluting stent 2011 with minimal LAD progression but no  critical disease involving the LAD or left circumflex. Check preserved overall LV systolic function.Post cardiac catheterization she had no complaints and and was continued to be treated for UTI with amoxicillin. The patient was seen and examined today by Dr. Riley Kill on discharge and found to be stable. She will followup with Dr. Azalia Bilis primary physician for continued management of UTI and continue to follow up with Dr. Clifton James for continued assessment and treatment of CAD.   Discharge Exam: Blood pressure 125/69, pulse 50, temperature 98.5 F (36.9 C), temperature source Oral, resp. rate 15, height 5\' 7"  (1.702 m), weight 262 lb 5.6 oz (119 kg), SpO2 96.00%.  Lab Results  Component Value Date   CKTOTAL 83 03/16/2011   CKMB 2.0 03/16/2011   TROPONINI <0.30 03/16/2011    Lab Results  Component Value Date   CHOL 233* 03/16/2011   CHOL 224* 11/19/2010   CHOL 225* 08/14/2010   Lab Results  Component Value Date   HDL 56 03/16/2011   HDL 49.00 11/19/2010   HDL 61.20 08/14/2010   Lab Results  Component Value Date   LDLCALC 159* 03/16/2011   LDLCALC 123* 04/01/2010   LDLCALC 151 02/04/2010   Lab Results  Component Value Date   TRIG 89 03/16/2011   TRIG 102.0 11/19/2010   TRIG 84.0 08/14/2010   Lab Results  Component Value Date   CHOLHDL 4.2 03/16/2011   CHOLHDL 5 11/19/2010   CHOLHDL 4 08/14/2010   Lab Results  Component Value Date   LDLDIRECT 161.6 11/19/2010   LDLDIRECT 176.8 08/14/2010      Radiology: Dg Chest 2 View  03/15/2011  *RADIOLOGY REPORT*  Clinical Data: Chest pain  CHEST -  2 VIEW  Comparison:  08/06/2010  Findings: Cardiomediastinal silhouette is stable.  No acute infiltrate or pleural effusion.  No pulmonary edema.  Mild degenerative changes thoracic spine  IMPRESSION: .  No active disease.  No significant change.  Original Report Authenticated By: Natasha Mead, M.D.   Nm Myocar Single W/spect W/wall Motion And Ef  03/17/2011  *RADIOLOGY REPORT*  Clinical Data:   Chest pain.  MYOCARDIAL IMAGING WITH SPECT (REST)  Technique:  Standard myocardial SPECT imaging was performed after resting intravenous injection of 10 mCi Tc-29m .  Quantitative gated imaging was also performed to evaluate left ventricular wall motion, and estimate left ventricular ejection fraction. No stress imaging was performed.  No quantitative gated analysis was performed.  Comparison:  Chest radiographs 03/15/2011.  Findings: The left ventricular activity is within normal limits for body habitus.  There is mild apical thinning and breast attenuation of the anterior wall.  No wall motion study was performed.  IMPRESSION: Rest only examination demonstrates no abnormality of the left ventricular myocardium.  Original Report Authenticated By: Gerrianne Scale, M.D.      FOLLOW UP PLANS AND APPOINTMENTS Discharge Orders    Future Appointments: Provider: Department: Dept Phone: Center:   03/25/2011 8:00 AM [facility]  None     Current Discharge Medication List    START taking these medications   Details  amoxicillin (AMOXIL) 250 MG capsule Take 1 capsule (250 mg total) by mouth 3 (three) times daily. Qty: 21 capsule, Refills: 0    metoprolol tartrate (LOPRESSOR) 25 MG tablet Take 1 tablet (25 mg total) by mouth 2 (two) times daily. Qty: 60 tablet, Refills: 6    zolpidem (AMBIEN) 10 MG tablet Take 1 tablet (10 mg total) by mouth at bedtime as needed for sleep. Qty: 30 tablet, Refills: 0      CONTINUE these medications which have CHANGED   Details  amLODipine (NORVASC) 10 MG tablet Take 1 tablet (10 mg total) by mouth daily. Qty: 30 tablet, Refills: 6      CONTINUE these medications which have NOT CHANGED   Details  albuterol (PROVENTIL HFA;VENTOLIN HFA) 108 (90 BASE) MCG/ACT inhaler Inhale 2 puffs into the lungs every 6 (six) hours as needed. For bronchitis    aspirin 325 MG tablet Take 325 mg by mouth daily.      clopidogrel (PLAVIX) 75 MG tablet Take 75 mg by  mouth daily.      colchicine 0.6 MG tablet Take 0.6 mg by mouth daily.     levothyroxine (SYNTHROID, LEVOTHROID) 112 MCG tablet Take 1 tablet (112 mcg total) by mouth daily. Qty: 30 tablet, Refills: 6    pravastatin (PRAVACHOL) 40 MG tablet Take 1 tablet (40 mg total) by mouth at bedtime. Qty: 30 tablet, Refills: 3   Associated Diagnoses: Other and unspecified hyperlipidemia    quinapril (ACCUPRIL) 40 MG tablet Take 1 tablet (40 mg total) by mouth at bedtime. Qty: 30 tablet, Refills: 11   Associated Diagnoses: CAD (coronary artery disease)    rosuvastatin (CRESTOR) 40 MG tablet Take 40 mg by mouth daily.      nitroGLYCERIN (NITROSTAT) 0.4 MG SL tablet Place 0.4 mg under the tongue every 5 (five) minutes as needed.         Follow-up Information    Follow up with Rene Paci, MD on 03/18/2011.      Follow up with Verne Carrow, MD. (Our office will call you for appointment)    Contact information:   [facility] [address] [place] [address] [phone]            Time spent with patient to include physician time:30 minutes Signed: Joni Reining 03/18/2011, 12:57 PM Co-Sign MD   Shawnie Pons 4:14 AM 04/17/2011

## 2011-03-18 NOTE — Progress Notes (Signed)
Subjective:  No chest pain.  Her main complaint was UTI.  That is currently under treatment.  Will need follow up with Dr. Felicity Coyer.    Objective:  Vital Signs in the last 24 hours: Temp:  [97.9 F (36.6 C)-98.7 F (37.1 C)] 97.9 F (36.6 C) (01/01 0800) Pulse Rate:  [52-137] 52  (01/01 0800) Resp:  [15-18] 16  (01/01 0800) BP: (111-222)/(64-104) 114/74 mmHg (01/01 0800) SpO2:  [97 %] 97 % (01/01 0800)  Intake/Output from previous day: 12/31 0701 - 01/01 0700 In: 1512.5 [P.O.:800; I.V.:712.5] Out: 300 [Urine:300]   Physical Exam: General: Well developed, well nourished, in no acute distress. Head:  Normocephalic and atraumatic.  Bilateral soft carotid bruits.   Lungs: Clear to auscultation and percussion.  No ronchii or rales at present.  Heart: Normal S1 and S2.  1/6 SEM.  No DM. Pulses: Pulses normal in all 4 extremities. Extremities: No clubbing or cyanosis. No edema.  Soft bifemoral bruits.  No masses, no hematoma in area of right groin. Neurologic: Alert and oriented x 3.    Lab Results:  Basename 03/18/11 0410 03/17/11 0150  WBC 7.2 9.0  HGB 13.2 12.8  PLT 236 234    Basename 03/18/11 0410 03/16/11 0453  NA 139 139  K 4.1 3.7  CL 109 105  CO2 22 25  GLUCOSE 91 93  BUN 15 15  CREATININE 0.99 0.73    Basename 03/16/11 1200 03/16/11 0453  TROPONINI <0.30 <0.30   Hepatic Function Panel No results found for this basename: PROT,ALBUMIN,AST,ALT,ALKPHOS,BILITOT,BILIDIR,IBILI in the last 72 hours  Basename 03/16/11 0453  CHOL 233*   No results found for this basename: PROTIME in the last 72 hours  Imaging: Nm Myocar Single W/spect W/wall Motion And Ef  03/17/2011  *RADIOLOGY REPORT*  Clinical Data:  Chest pain.  MYOCARDIAL IMAGING WITH SPECT (REST)  Technique:  Standard myocardial SPECT imaging was performed after resting intravenous injection of 10 mCi Tc-74m .  Quantitative gated imaging was also performed to evaluate left ventricular wall motion, and  estimate left ventricular ejection fraction. No stress imaging was performed.  No quantitative gated analysis was performed.  Comparison:  Chest radiographs 03/15/2011.  Findings: The left ventricular activity is within normal limits for body habitus.  There is mild apical thinning and breast attenuation of the anterior wall.  No wall motion study was performed.  IMPRESSION: Rest only examination demonstrates no abnormality of the left ventricular myocardium.  Original Report Authenticated By: Gerrianne Scale, M.D.       Assessment/Plan:  Patient Active Hospital Problem List: ACS (acute coronary syndrome) (03/15/2011)   Assessment: RCA remains widely patent, and there does not appear to be change in other anatomy   Plan: Continue medical therapy.  Needs follow up in less than one week with Wende Mott or [facility] for groin check.  CORONARY ATHEROSCLEROSIS NATIVE CORONARY ARTERY (04/01/2010)   Assessment: See above.  See cath report   Plan: See above Carotid stenosis   Due for dopplers but she wants to put off--no insurance at present.   Soft bilateral bruits noted on exam.  Has higher grade R carotid disease.   Has been on clopidogrel for carotid disease for ten years.  Does not need necessarily for stents given time.  Also, ASA dosing could be reconsidered.  Perhaps Dr. Clifton James should address.   Hyperlipidemia   Was not at target on prava 40mg .  However, Crestor is prohibitive in cost.  Would dc on 40mg  with note  for follow up with Dr. Clifton James for adjustment. UTI   E Coli, symptomatic.  Would prescribe Amx 250 mg three times daily for 5 more days, with follow up with Dr. Felicity Coyer for reevaluation.  Patient told.  Hypothyroidism   Patient takes 112 mcg/d at home.  Continue at present.  Encouraged patient to take it easy for a few days at home.  Stop Bactrim  (Variable sensitivity.       Shawnie Pons, MD, [facility], FSCAI 03/18/2011, 9:34 AM

## 2011-03-19 MED FILL — Aspirin Chew Tab 81 MG: ORAL | Qty: 4 | Status: AC

## 2011-03-24 ENCOUNTER — Other Ambulatory Visit: Payer: Self-pay | Admitting: Cardiovascular Disease

## 2011-03-24 ENCOUNTER — Other Ambulatory Visit: Payer: Self-pay | Admitting: *Deleted

## 2011-03-24 DIAGNOSIS — I6529 Occlusion and stenosis of unspecified carotid artery: Secondary | ICD-10-CM

## 2011-03-24 NOTE — Telephone Encounter (Signed)
New Msg: Pt calling wanting medication written for generic therefore she can get medication cheaper. Please call generic medication into [facility] on [address].

## 2011-03-25 ENCOUNTER — Other Ambulatory Visit: Payer: Self-pay | Admitting: *Deleted

## 2011-03-25 ENCOUNTER — Encounter (INDEPENDENT_AMBULATORY_CARE_PROVIDER_SITE_OTHER): Payer: Self-pay | Admitting: *Deleted

## 2011-03-25 DIAGNOSIS — I6529 Occlusion and stenosis of unspecified carotid artery: Secondary | ICD-10-CM

## 2011-04-03 ENCOUNTER — Ambulatory Visit (INDEPENDENT_AMBULATORY_CARE_PROVIDER_SITE_OTHER): Payer: Self-pay | Admitting: Physician Assistant

## 2011-04-03 ENCOUNTER — Encounter: Payer: Self-pay | Admitting: Physician Assistant

## 2011-04-03 DIAGNOSIS — E785 Hyperlipidemia, unspecified: Secondary | ICD-10-CM

## 2011-04-03 DIAGNOSIS — I10 Essential (primary) hypertension: Secondary | ICD-10-CM

## 2011-04-03 DIAGNOSIS — I2510 Atherosclerotic heart disease of native coronary artery without angina pectoris: Secondary | ICD-10-CM

## 2011-04-03 MED ORDER — PRAVASTATIN SODIUM 40 MG PO TABS: 40.0000 mg | ORAL_TABLET | Freq: Every day | ORAL | Status: DC

## 2011-04-03 MED ORDER — LISINOPRIL-HYDROCHLOROTHIAZIDE 20-12.5 MG PO TABS: 2.0000 | ORAL_TABLET | Freq: Every day | ORAL | Status: DC

## 2011-04-03 NOTE — Assessment & Plan Note (Signed)
Restart pravastatin 40 mg q.h.s.  Check lipids and LFTs at followup in 3 months.

## 2011-04-03 NOTE — Progress Notes (Signed)
[address] [place], [place]  [address] Phone: [phone] Fax:  [phone]  Date:  04/03/2011   Name:  Caitlyn [name]       DOB:  13-May-1950 MRN:  [id number]  PCP:  Dr. Felicity Coyer Primary Cardiologist:  Dr. Verne Carrow  Primary Electrophysiologist:  None    History of Present Illness: Caitlyn [name] is a 61 y.o. female who presents for post hospital follow up.  She has a history of CAD, Carotid stenosis, hypertension, hyperlipidemia, hypothyroidism.  She was previously followed by Dr. Juanda Chance.  She is now seen by Dr. Verne Carrow.  She had an MI 4/11 treated with DES x2 to the RCA.  Last Dopplers 1/12:60-80% RICA.  Last echo 4/11: EF 55-60%.  She was admitted 12/29-1/1 with chest pain.  She ruled out for myocardial infarction.  Stress Myoview was performed.  She had significant EKG changes on the treadmill and cardiac catheterization was recommended.  LHC 03/17/11: LAD 30-40%, distal LAD 30-40%, OM2 40-50%, RCA stent patent, EF greater than 70%.  She was treated for urinary tract infection.  Labs: Potassium 4.1, creatinine 0.99, ALT 16, hemoglobin 13.2, TC 233, TG 89, HDL 56, LDL 159.  Chest x-ray unremarkable.  Followup carotid Dopplers 03/25/11: RICA 60-79%; LICA 0-39%.  Follow up recommended in 6 months.  The patient denies chest pain, shortness of breath, syncope, orthopnea, PND or significant pedal edema.   She needs help with medications.  Norvasc and Accupril are not on $4 list at [facility].  Not taking any chol meds now.    Past Medical History  Diagnosis Date  . HYPOTHYROIDISM 07/23/2009  . HYPERLIPIDEMIA 07/23/2009  . HYPERTENSION 07/23/2009  . CAD 07/23/2009    s/p MI 4/11: tx with DES x 2 to RCA;  LHC 03/17/11: LAD 30-40%, distal LAD 30-40%, OM2 40-50%, RCA stent patent, EF greater than 70%.  . CAROTID STENOSIS 07/24/2009    carotid Dopplers 03/25/11: RICA 60-79%; LICA 0-39%.  Follow up recommended in 6 months.  Marland Kitchen PVD 07/23/2009  . GRAVES'  DISEASE, HX OF 07/23/2009  . VITAMIN D DEFICIENCY 03/15/2010  . TOBACCO ABUSE 03/15/2010  . FATIGUE / MALAISE 03/07/2010    Current Outpatient Prescriptions  Medication Sig Dispense Refill  . albuterol (PROVENTIL HFA;VENTOLIN HFA) 108 (90 BASE) MCG/ACT inhaler Inhale 2 puffs into the lungs every 6 (six) hours as needed. For bronchitis      . amLODipine (NORVASC) 10 MG tablet Take 1 tablet (10 mg total) by mouth daily.  30 tablet  6  . aspirin 325 MG tablet Take 325 mg by mouth daily.        . clopidogrel (PLAVIX) 75 MG tablet Take 75 mg by mouth daily.        . colchicine 0.6 MG tablet Take 0.6 mg by mouth daily.       Marland Kitchen levothyroxine (SYNTHROID, LEVOTHROID) 112 MCG tablet Take 1 tablet (112 mcg total) by mouth daily.  30 tablet  6  . metoprolol tartrate (LOPRESSOR) 25 MG tablet Take 1 tablet (25 mg total) by mouth 2 (two) times daily.  60 tablet  6  . nitroGLYCERIN (NITROSTAT) 0.4 MG SL tablet Place 0.4 mg under the tongue every 5 (five) minutes as needed.        . quinapril (ACCUPRIL) 40 MG tablet Take 1 tablet (40 mg total) by mouth at bedtime.  30 tablet  11  . rosuvastatin (CRESTOR) 40 MG tablet Take 40 mg by mouth daily.        Marland Kitchen  zolpidem (AMBIEN) 10 MG tablet Take 1 tablet (10 mg total) by mouth at bedtime as needed for sleep.  30 tablet  0  . pravastatin (PRAVACHOL) 40 MG tablet Take 1 tablet (40 mg total) by mouth at bedtime.  30 tablet  3    Allergies: No Known Allergies  History  Substance Use Topics  . Smoking status: Former Smoker    Quit date: 06/30/2009  . 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  Divorced, single, lives alone- 2 grown dtr nearby, 3 g-kds  . Alcohol Use:      PHYSICAL EXAM: VS:  BP 146/82  Pulse 52  Resp 18  Ht 5\' 7"  (1.702 m)  Wt 259 lb 6.4 oz (117.663 kg)  BMI 40.63 kg/m2 Well nourished, well developed, in no acute distress HEENT: normal Neck: no  JVD Cardiac:  normal S1, S2; RRR; no murmur Lungs:  clear to auscultation bilaterally, no wheezing, rhonchi or rales Abd: soft, nontender, no hepatomegaly Ext: no edema;   RFA site without hematoma or bruit Skin: warm and dry Neuro:  CNs 2-12 intact, no focal abnormalities noted  EKG:   Sinus bradycardia, heart rate 52, normal axis, poor R-wave progression, nonspecific ST-T wave changes  ASSESSMENT AND PLAN:

## 2011-04-03 NOTE — Assessment & Plan Note (Signed)
Recent Dopplers stable.  Repeat in 6 months.

## 2011-04-03 NOTE — Patient Instructions (Signed)
Your physician recommends that you schedule a follow-up appointment in: 3 months with Dr Clifton James Your physician recommends that you schedule a follow-up appointment in: 1 week for a nurse visit for BP check  Your physician recommends that you return for lab work in: 1 week for a BMP and 3 mths for lipid and liver profile Your physician has recommended you make the following change in your medication: STOP Quinapril and Amlodipine START Lisinopril HCTZ 20/12.5 2 tablets once a day Your physician has requested that you have a carotid duplex. This test is an ultrasound of the carotid arteries in your neck. It looks at blood flow through these arteries that supply the brain with blood. Allow one hour for this exam. There are no restrictions or special instructions.

## 2011-04-03 NOTE — Assessment & Plan Note (Signed)
Stable anatomy at recent cath.  Continue medical therapy.  Has trouble affording Plavix.  States she has taken for 10 years.  Will check with Dr. Verne Carrow.  Could check into [employer] assistance if we felt she needed long term DAPT.  Follow up with Dr. Verne Carrow in 3 mos.

## 2011-04-03 NOTE — Assessment & Plan Note (Signed)
She has trouble affording medications.  I will try to change her medications to reflect the $4 [facility] list. 1. Stop Accupril 2. Stop Norvasc 3. Start lisinopril/HCTZ 20/12.5 mg take 2 tablets daily 4. Blood pressure check and Basic metabolic panel in one week 5. If blood pressure not at goal, consider changing metoprolol to carvedilol versus adding hydralazine

## 2011-04-09 ENCOUNTER — Ambulatory Visit: Payer: Self-pay

## 2011-04-09 ENCOUNTER — Other Ambulatory Visit: Payer: Self-pay | Admitting: *Deleted

## 2011-04-23 ENCOUNTER — Other Ambulatory Visit: Payer: Self-pay | Admitting: Adult Health

## 2011-04-24 ENCOUNTER — Other Ambulatory Visit: Payer: Self-pay | Admitting: *Deleted

## 2011-04-24 NOTE — Telephone Encounter (Signed)
**Note De-identified Caitlyn [name] Obfuscation** [place] pt. 

## 2011-04-30 ENCOUNTER — Other Ambulatory Visit: Payer: Self-pay | Admitting: Cardiovascular Disease

## 2011-05-09 ENCOUNTER — Telehealth: Payer: Self-pay | Admitting: Cardiovascular Disease

## 2011-05-09 ENCOUNTER — Other Ambulatory Visit: Payer: Self-pay | Admitting: Adult Health

## 2011-05-09 NOTE — Telephone Encounter (Signed)
Per pt call she spoke with the nurse about Remus Loffler and was told it would be called in but it has not been called in

## 2011-05-09 NOTE — Telephone Encounter (Signed)
Spoke with pt and asked her to contact primary care MD for refills of Ambien. Pt agreeable with this plan.  Pt needs to reschedule nurse room visit and lab appt. Transferred to schedulers to make this appt.

## 2011-05-14 ENCOUNTER — Other Ambulatory Visit: Payer: Self-pay

## 2011-05-20 ENCOUNTER — Telehealth: Payer: Self-pay | Admitting: Cardiovascular Disease

## 2011-05-20 MED ORDER — ZOLPIDEM TARTRATE 10 MG PO TABS: 10.0000 mg | ORAL_TABLET | Freq: Every evening | ORAL | Status: DC | PRN

## 2011-05-20 NOTE — Telephone Encounter (Signed)
FU Call: Pt returning call from our office from yesterday. Please return pt call to discuss further.  

## 2011-05-20 NOTE — Telephone Encounter (Signed)
Spoke with pt and told her I did not see a call placed to her from our office.  Pt missed nurse room visit and lab work and she is aware that these have been rescheduled. She plans on being here for these appointments.  She states she has not seen primary care for awhile and states she has not been able to sleep.  Will refill Ambien for one month. Pt aware she will need to contact Dr. Diamantina Monks office to schedule appt and refills for Ambien should be then handled by primary care.

## 2011-06-04 ENCOUNTER — Ambulatory Visit (INDEPENDENT_AMBULATORY_CARE_PROVIDER_SITE_OTHER): Payer: Self-pay | Admitting: *Deleted

## 2011-06-04 ENCOUNTER — Other Ambulatory Visit (INDEPENDENT_AMBULATORY_CARE_PROVIDER_SITE_OTHER): Payer: Self-pay

## 2011-06-04 ENCOUNTER — Ambulatory Visit (INDEPENDENT_AMBULATORY_CARE_PROVIDER_SITE_OTHER): Payer: Self-pay | Admitting: Internal Medicine

## 2011-06-04 ENCOUNTER — Encounter: Payer: Self-pay | Admitting: Internal Medicine

## 2011-06-04 VITALS — BP 148/84 | HR 54 | Wt 253.0 lb

## 2011-06-04 VITALS — BP 122/82 | HR 51 | Temp 97.4°F | Ht 67.0 in | Wt 255.4 lb

## 2011-06-04 DIAGNOSIS — E785 Hyperlipidemia, unspecified: Secondary | ICD-10-CM

## 2011-06-04 DIAGNOSIS — I10 Essential (primary) hypertension: Secondary | ICD-10-CM

## 2011-06-04 DIAGNOSIS — J309 Allergic rhinitis, unspecified: Secondary | ICD-10-CM

## 2011-06-04 DIAGNOSIS — E039 Hypothyroidism, unspecified: Secondary | ICD-10-CM

## 2011-06-04 DIAGNOSIS — I2510 Atherosclerotic heart disease of native coronary artery without angina pectoris: Secondary | ICD-10-CM

## 2011-06-04 DIAGNOSIS — G4700 Insomnia, unspecified: Secondary | ICD-10-CM

## 2011-06-04 LAB — BASIC METABOLIC PANEL
BUN: 23 mg/dL (ref 6–23)
Chloride: 104 mEq/L (ref 96–112)
Creatinine, Ser: 1.1 mg/dL (ref 0.4–1.2)
GFR: 67.66 mL/min (ref >60.00)

## 2011-06-04 LAB — HEPATIC FUNCTION PANEL
AST: 20 U/L (ref 0–37)
Albumin: 3.9 g/dL (ref 3.5–5.2)
Alkaline Phosphatase: 79 U/L (ref 39–117)
Bilirubin, Direct: 0 mg/dL (ref 0.0–0.3)

## 2011-06-04 LAB — LIPID PANEL
Cholesterol: 231 mg/dL — ABNORMAL HIGH (ref 0–200)
Total CHOL/HDL Ratio: 5

## 2011-06-04 MED ORDER — CARVEDILOL 6.25 MG PO TABS: 6.2500 mg | ORAL_TABLET | Freq: Two times a day (BID) | ORAL | Status: DC

## 2011-06-04 MED ORDER — FLUTICASONE PROPIONATE 50 MCG/ACT NA SUSP: 2.0000 | Freq: Every day | NASAL | Status: DC

## 2011-06-04 MED ORDER — LORATADINE 10 MG PO TABS: 10.0000 mg | ORAL_TABLET | Freq: Every day | ORAL | Status: DC | PRN

## 2011-06-04 MED ORDER — AMITRIPTYLINE HCL 10 MG PO TABS: 10.0000 mg | ORAL_TABLET | Freq: Every day | ORAL | Status: DC

## 2011-06-04 MED ORDER — AMLODIPINE BESYLATE 10 MG PO TABS: 10.0000 mg | ORAL_TABLET | Freq: Every day | ORAL | Status: DC

## 2011-06-04 NOTE — Progress Notes (Signed)
Pt here for blood pressure check. Med reviewed. She is only taking aspirin occasionally. I told her she should take everyday.  She is only taking metoprolol daily.  Amlodipine was stopped at last office visit with Tereso Newcomer, PA but pt is continuing to take this.   Vital signs as documented.  She states she has been having sinus problems lately and is to see primary care for this later today.  I reviewed med list and vital signs with Tereso Newcomer, PA. Instructions given for pt to stop metoprolol, continue amlodipine 10 mg daily and start coreg 6.25 mg twice daily.  She is to follow up with Dr. Clifton James in 4 weeks. Pt given these instructions and given after visit summary with these instructions.

## 2011-06-04 NOTE — Assessment & Plan Note (Signed)
Hx Graves with RAI (remote) The current medical regimen is effective;  continue present plan and medications. Lab Results  Component Value Date   TSH 0.79 08/14/2010

## 2011-06-04 NOTE — Assessment & Plan Note (Signed)
Poor compliance with prava as rx'd -  reminded of importance of same with CAD/PAD hx and stents  

## 2011-06-04 NOTE — Progress Notes (Signed)
Subjective:    Patient ID: Caitlyn [name], female    DOB: [birth year], 61 y.o.   MRN: [id number]  HPI Here for follow up - reviewed chronic medical issues: Last OV with me 12/2009  CAD - MI 06/2009 with 2 DES to RCA - no anginal symptoms - follows regularly with cards for same - reports compliance with ongoing medical treatment and no changes in medication dose or frequency. denies adverse side effects related to current therapy.  - cont vague SSCP nonexertional, not positional with repeat card eval - cath 03/17/11 - no cardaic abn found    dyslipidemia - intol of crestor due to myalgia (improved off med) - tried on welchol but poor tol due to constipation - on prav at urging by cards, but admits intermittent compliance  hypothyroid - hx graves - reports compliance with ongoing medical treatment and no changes in medication dose or frequency. denies adverse side effects related to current therapy. no skin or bowel changes  HTN-reports compliance with ongoing medical treatment and no changes in medication dose or frequency. denies adverse side effects related to current therapy.    Past Medical History  Diagnosis Date  . HYPOTHYROIDISM     post ablation tx Graves  . HYPERLIPIDEMIA   . HYPERTENSION   . CAD 06/2009    s/p MI 4/11: tx with DES x 2 to RCA;  LHC 03/17/11: LAD 30-40%, distal LAD 30-40%, OM2 40-50%, RCA stent patent, EF greater than 70%.  . CAROTID STENOSIS     carotid Dopplers 03/25/11: RICA 60-79%; LICA 0-39%.  Follow up recommended in 6 months.  . PVD   . VITAMIN D DEFICIENCY     DEXA 04/2009 normal  . TOBACCO ABUSE quit 06/2009    Review of Systems  Constitutional: Negative for fever and fatigue.  HENT: Positive for rhinorrhea, sneezing and postnasal drip. Negative for mouth sores and ear discharge.   Neurological: Negative for dizziness and headaches.        Objective:   Physical Exam BP 122/82  Pulse 51  Temp(Src) 97.4 F (36.3 C) (Oral)  Ht 5\' 7"  (1.702 m)   Wt 255 lb 6.4 oz (115.849 kg)  BMI 40.00 kg/m2  SpO2 96% Wt Readings from Last 3 Encounters:  06/04/11 255 lb 6.4 oz (115.849 kg)  06/04/11 253 lb (114.76 kg)  04/03/11 259 lb 6.4 oz (117.663 kg)   Constitutional: She appears well-developed and well-nourished. No distress.  HENT: Head: Normocephalic and atraumatic. Ears: B TMs ok, no erythema or effusion; Nose: Nose normal. Mouth/Throat: Oropharynx is clear and moist. No oropharyngeal exudate.  Eyes: Conjunctivae and EOM are normal. Pupils are equal, round, and reactive to light. No scleral icterus.  Neck: Normal range of motion. Neck supple. No JVD present. No thyromegaly present.  Cardiovascular: Normal rate, regular rhythm and normal heart sounds.  No murmur heard. No BLE edema. Pulmonary/Chest: Effort normal and breath sounds normal. No respiratory distress. She has no wheezes.  Psychiatric: She has a normal mood and affect. Her behavior is normal. Judgment and thought content normal.   Lab Results  Component Value Date   WBC 7.2 03/18/2011   HGB 13.2 03/18/2011   HCT 39.7 03/18/2011   PLT 236 03/18/2011   GLUCOSE 91 03/18/2011   CHOL 233* 03/16/2011   TRIG 89 03/16/2011   HDL 56 03/16/2011   LDLDIRECT 161.6 11/19/2010   LDLCALC 159* 03/16/2011   ALT 16 08/14/2010   AST 21 08/14/2010   NA 139 03/18/2011  K 4.1 03/18/2011   CL 109 03/18/2011   CREATININE 0.99 03/18/2011   BUN 15 03/18/2011   CO2 22 03/18/2011   TSH 0.79 08/14/2010   INR 1.03 03/16/2011       Assessment & Plan:  See problem list. Medications and labs reviewed today.  Allergic sinusitis - add flonase daily and use claritin prn  Insomnia - unable to afford Ambien - chronic symptoms - try low dose amitriptyline qhs - will titrate as needed

## 2011-06-04 NOTE — Patient Instructions (Addendum)
Your physician recommends that you schedule a follow-up appointment in: 4 weeks with Dr. Clifton James.  Your physician has recommended you make the following change in your medication: Stop metoprolol Start Coreg (carvedilol) 6 .25 mg by mouth twice daily.   Resume amlodipine 10 mg by mouth daily.  Take your aspirin 325 mg daily

## 2011-06-04 NOTE — Patient Instructions (Signed)
It was good to see you today. We have reviewed your prior records including labs and tests today Use Flonase for sinus symptoms - claritin as needed for allergy symptoms and amitriptyline at bedtime for sleep - Your prescription(s) have been submitted to your pharmacy. Please take as directed and contact our office if you believe you are having problem(s) with the medication(s). Other Medications reviewed, no additional changes at this time. Please schedule followup in 6-12 months, call sooner if problems.

## 2011-06-05 NOTE — Progress Notes (Signed)
Agree Tereso Newcomer, PA-C  8:31 AM 06/05/2011

## 2011-06-06 ENCOUNTER — Telehealth: Payer: Self-pay | Admitting: *Deleted

## 2011-06-06 ENCOUNTER — Encounter: Payer: Self-pay | Admitting: *Deleted

## 2011-06-06 NOTE — Telephone Encounter (Signed)
Message copied by Tarri Fuller on Fri Jun 06, 2011  2:51 PM ------      Message from: [place], [place] T      Created: Thu Jun 05, 2011 11:42 AM       LDL too high      See if she is taking Pravastatin regularly.      Tereso Newcomer, PA-C  11:42 AM 06/05/2011

## 2011-06-06 NOTE — Telephone Encounter (Signed)
Message copied by Lura Falor M on Fri Jun 06, 2011  2:51 PM ------      Message from: WEAVER, SCOTT T      Created: Thu Jun 05, 2011 11:42 AM       LDL too high      See if she is taking Pravastatin regularly.      Scott Weaver, PA-C  11:42 AM 06/05/2011 

## 2011-06-06 NOTE — Telephone Encounter (Signed)
pt admits that she has not taken her pravastatin everyday. I advised pt that she needs to take statin everyday to help LDL to go down and that her optium LDL # is <70 due to CAD, pt aware. Danielle Rankin

## 2011-06-27 ENCOUNTER — Telehealth: Payer: Self-pay | Admitting: Internal Medicine

## 2011-06-27 MED ORDER — ZOLPIDEM TARTRATE 10 MG PO TABS: 10.0000 mg | ORAL_TABLET | Freq: Every evening | ORAL | Status: DC | PRN

## 2011-06-27 MED ORDER — LEVOTHYROXINE SODIUM 112 MCG PO TABS: 112.0000 ug | ORAL_TABLET | Freq: Every day | ORAL | Status: DC

## 2011-06-27 NOTE — Telephone Encounter (Signed)
Notified pt [facility]. Zolpidem actually was called in spoke with [facility]. EPIC updated... 06/27/11@3 :44pm/LMB

## 2011-06-27 NOTE — Telephone Encounter (Signed)
Pt requesting synthroid prescription--levothyroxie--pt says out of this med 112 mg--[facility] [address]-pt ph# [phone]--pt is requesting zolpidem 10 mg sleeping med--

## 2011-07-10 ENCOUNTER — Ambulatory Visit: Payer: Self-pay | Admitting: Cardiovascular Disease

## 2011-07-22 ENCOUNTER — Other Ambulatory Visit: Payer: Self-pay | Admitting: *Deleted

## 2011-07-22 NOTE — Telephone Encounter (Signed)
Pt states the zolpidem med is not helping. She is still having trouble sleeping. Requesting md to change to something else. Did inform pt md is out office this afternoon will be tomorrow before msg may be address... 07/22/11@2 :39pm/LMB

## 2011-07-23 MED ORDER — AMITRIPTYLINE HCL 25 MG PO TABS: 25.0000 mg | ORAL_TABLET | Freq: Every day | ORAL | Status: DC

## 2011-07-23 NOTE — Telephone Encounter (Signed)
Pt return call back gave md response. Pt agreed [facility]... 07/23/11@3 :30pm/LMB

## 2011-07-23 NOTE — Telephone Encounter (Signed)
Called pt no answer LMOM RTC... 07/23/11@11 :16am/LMB

## 2011-07-23 NOTE — Telephone Encounter (Signed)
Increase amitriptyline to 25mg  qhs

## 2011-07-31 ENCOUNTER — Encounter: Payer: Self-pay | Admitting: *Deleted

## 2011-07-31 ENCOUNTER — Encounter: Payer: Self-pay | Admitting: Cardiovascular Disease

## 2011-07-31 ENCOUNTER — Ambulatory Visit (INDEPENDENT_AMBULATORY_CARE_PROVIDER_SITE_OTHER): Payer: Self-pay | Admitting: Cardiovascular Disease

## 2011-07-31 VITALS — BP 110/76 | HR 60 | Ht 66.5 in | Wt 251.8 lb

## 2011-07-31 DIAGNOSIS — M79606 Pain in leg, unspecified: Secondary | ICD-10-CM

## 2011-07-31 DIAGNOSIS — I10 Essential (primary) hypertension: Secondary | ICD-10-CM

## 2011-07-31 DIAGNOSIS — I2510 Atherosclerotic heart disease of native coronary artery without angina pectoris: Secondary | ICD-10-CM

## 2011-07-31 DIAGNOSIS — M79609 Pain in unspecified limb: Secondary | ICD-10-CM

## 2011-07-31 MED ORDER — CARVEDILOL 3.125 MG PO TABS: 3.1250 mg | ORAL_TABLET | Freq: Two times a day (BID) | ORAL | Status: DC

## 2011-07-31 NOTE — Assessment & Plan Note (Signed)
Will get ABI to exclude PAD.

## 2011-07-31 NOTE — Progress Notes (Signed)
History of Present Illness: 61 yo AAF with h/o CAD, hyperlipidemia, hypothyroidism, HTN here for cardiac follow up. She has been followed in the past by Dr. Juanda Chance. She had an Mi in 06/2009 and had 2 DES placed in the RCA.  She had chest pains leading to a stress test and cardiac cath in December 2012. Cath per Dr. Riley Kill with stable CAD. Carotid Dopplers 03/25/11: RICA 60-79%; LICA 0-39%.   She is doing well. Only c/o bilateral leg pain at rest. Not worsened with walking. Also insomnia which is chronic. No change in mild occasional chest pains. Breathing is ok.    Primary Care Physician: Felicity Coyer  Last Lipid Profile: Lipid Panel     Component Value Date/Time   CHOL 231* 06/04/2011 0858   TRIG 127.0 06/04/2011 0858   HDL 46.30 06/04/2011 0858   CHOLHDL 5 06/04/2011 0858   VLDL 25.4 06/04/2011 0858   LDLCALC 159* 03/16/2011 0453     Past Medical History  Diagnosis Date  . HYPOTHYROIDISM     post ablation tx Graves  . HYPERLIPIDEMIA   . HYPERTENSION   . CAD 06/2009    s/p MI 4/11: tx with DES x 2 to RCA;  LHC 03/17/11: LAD 30-40%, distal LAD 30-40%, OM2 40-50%, RCA stent patent, EF greater than 70%.  . CAROTID STENOSIS     carotid Dopplers 03/25/11: RICA 60-79%; LICA 0-39%.  Follow up recommended in 6 months.  . PVD   . VITAMIN D DEFICIENCY     DEXA 04/2009 normal  . TOBACCO ABUSE quit 06/2009    Past Surgical History  Procedure Date  . Carotid stent     bilateral carotid artery disease post stent of left carotid  . Radioactive plaque insertion     Graves disease post radioactive treatment complicated hypothyroidism    Current Outpatient Prescriptions  Medication Sig Dispense Refill  . amitriptyline (ELAVIL) 25 MG tablet Take 1 tablet (25 mg total) by mouth at bedtime.  30 tablet  3  . amLODipine (NORVASC) 10 MG tablet Take 1 tablet (10 mg total) by mouth daily.  30 tablet  11  . aspirin 325 MG tablet Take 325 mg by mouth as needed.       . carvedilol (COREG) 6.25 MG tablet Take  1 tablet (6.25 mg total) by mouth 2 (two) times daily.  60 tablet  11  . clopidogrel (PLAVIX) 75 MG tablet Take 75 mg by mouth daily.        . colchicine 0.6 MG tablet Take 0.6 mg by mouth daily.       . fluticasone (FLONASE) 50 MCG/ACT nasal spray Place 2 sprays into the nose daily.  16 g  2  . levothyroxine (SYNTHROID, LEVOTHROID) 112 MCG tablet Take 1 tablet (112 mcg total) by mouth daily.  30 tablet  5  . lisinopril-hydrochlorothiazide (PRINZIDE,ZESTORETIC) 20-12.5 MG per tablet Take 2 tablets by mouth daily.  60 tablet  3  . nitroGLYCERIN (NITROSTAT) 0.4 MG SL tablet Place 0.4 mg under the tongue every 5 (five) minutes as needed.        . pravastatin (PRAVACHOL) 40 MG tablet Take 1 tablet (40 mg total) by mouth at bedtime.  30 tablet  3  . zolpidem (AMBIEN) 10 MG tablet Take 1 tablet (10 mg total) by mouth at bedtime as needed for sleep.  30 tablet  0  . DISCONTD: amitriptyline (ELAVIL) 25 MG tablet Take 25 mg by mouth at bedtime.      Marland Kitchen DISCONTD:  metoprolol tartrate (LOPRESSOR) 25 MG tablet Take 1 tablet (25 mg total) by mouth 2 (two) times daily.  60 tablet  6    No Known Allergies  History   Social History  . Marital Status: Divorced    Spouse Name: N/A    Number of Children: N/A  . Years of Education: N/A   Occupational History  . Not on file.   Social History Main Topics  . Smoking status: Former Smoker    Quit date: 06/30/2009  . 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  Divorced, single, lives alone- 2 grown dtr nearby, 3 g-kds  . Alcohol Use: Not on file  . Drug Use: Not on file  . Sexually Active: Not on file     Quit smoking after MI- prev 1ppd x 35ys   Other Topics Concern  . Not on file   Social History Narrative  . No narrative on file    Family History  Problem Relation Age of Onset  . Coronary artery disease Other   . Hypertension Other     Review of Systems:  As stated in the  HPI and otherwise negative.   BP 110/76  Pulse 60  Ht 5' 6.5" (1.689 m)  Wt 251 lb 12.8 oz (114.216 kg)  BMI 40.03 kg/m2  Physical Examination: General: Well developed, well nourished, NAD HEENT: OP clear, mucus membranes moist SKIN: warm, dry. No rashes. Neuro: No focal deficits Musculoskeletal: Muscle strength 5/5 all ext Psychiatric: Mood and affect normal Neck: No JVD, no carotid bruits, no thyromegaly, no lymphadenopathy. Lungs:Clear bilaterally, no wheezes, rhonci, crackles Cardiovascular: Regular rate and rhythm. No murmurs, gallops or rubs. Abdomen:Soft. Bowel sounds present. Non-tender.  Extremities: No lower extremity edema. Pulses are trace to 1 + in the bilateral DP/PT.  Cardiac cath 03/17/11:  Left mainstem: No significant disease  Left anterior descending (LAD): 30-40% at takeoff of D2, and 30-40% more distally. No critical narrowing. Studies were compared to old films. ? Minimal progression of disease in LAD.  Left circumflex (LCx): Provides 2 OM branches. OM2 has segmental plaque of 40-50%, similar to prior study.  Right coronary artery (RCA): Has previously placed overlapping DES (Promus) in the mid RCA. 20% narrowing prior to stent. No in stent restenosis noted, with widely patent stents. 10-20% eccentric plaque in distal vessel prior to smaller caliber PDA takeoff. PLA is also relatively small.  Left ventriculography: Ventricular ectopy, so no sinus beats. However, hyperdynamic post PVC function with EF >70%. No WMA on post beats.  Final Conclusions:  1. Continued patency of the RCA DES stents from 2011  2. Minimal LAD progression, no critical disease involving the LAD or LCX.  3. Preserved overall LV systolic function.

## 2011-07-31 NOTE — Patient Instructions (Addendum)
.  Your physician wants you to follow-up in: 6 months. You will receive a reminder letter in the mail two months in advance. If you don't receive a letter, please call our office to schedule the follow-up appointment.  Your physician has requested that you have an ankle brachial index (ABI). During this test an ultrasound and blood pressure cuff are used to evaluate the arteries that supply the arms and legs with blood. Allow thirty minutes for this exam. There are no restrictions or special instructions.   Your physician has requested that you have a carotid duplex. This test is an ultrasound of the carotid arteries in your neck. It looks at blood flow through these arteries that supply the brain with blood. Allow one hour for this exam. There are no restrictions or special instructions. To be done in July   Your physician has recommended you make the following change in your medication: Decrease carvedilol to 3.125 mg by mouth twice daily

## 2011-07-31 NOTE — Assessment & Plan Note (Signed)
Stable. Will lower Coreg to 3.125 mg po BID. Continue other meds.

## 2011-07-31 NOTE — Assessment & Plan Note (Signed)
Moderate disease. Repeat carotids due in July 2013.

## 2011-08-01 ENCOUNTER — Encounter (INDEPENDENT_AMBULATORY_CARE_PROVIDER_SITE_OTHER): Payer: Self-pay

## 2011-08-01 DIAGNOSIS — I739 Peripheral vascular disease, unspecified: Secondary | ICD-10-CM

## 2011-08-01 DIAGNOSIS — M79606 Pain in leg, unspecified: Secondary | ICD-10-CM

## 2011-08-06 ENCOUNTER — Other Ambulatory Visit: Payer: Self-pay | Admitting: Internal Medicine

## 2011-08-06 NOTE — Telephone Encounter (Signed)
Pt says sleeping med is not working--[facility] ph [phone]--pt do not want ambien-pt desire a phone call

## 2011-08-06 NOTE — Telephone Encounter (Signed)
Stop amitriptyline, no Ambien Try trazodone prn - erx done

## 2011-08-07 MED ORDER — TRAZODONE HCL 50 MG PO TABS: 25.0000 mg | ORAL_TABLET | Freq: Every evening | ORAL | Status: DC | PRN

## 2011-08-07 NOTE — Telephone Encounter (Signed)
Called pt no answer LMOM md response... 08/07/11@9 ;18am/LMB

## 2011-08-16 ENCOUNTER — Other Ambulatory Visit: Payer: Self-pay | Admitting: Physician Assistant

## 2011-08-18 ENCOUNTER — Other Ambulatory Visit: Payer: Self-pay

## 2011-08-19 ENCOUNTER — Other Ambulatory Visit: Payer: Self-pay

## 2011-08-31 ENCOUNTER — Other Ambulatory Visit: Payer: Self-pay | Admitting: Cardiovascular Disease

## 2011-08-31 ENCOUNTER — Other Ambulatory Visit: Payer: Self-pay | Admitting: Internal Medicine

## 2011-09-01 NOTE — Telephone Encounter (Signed)
[facility]... 09/01/11@1 :24pm/LMB

## 2011-09-03 ENCOUNTER — Ambulatory Visit: Payer: Self-pay | Admitting: Internal Medicine

## 2011-09-03 DIAGNOSIS — Z0289 Encounter for other administrative examinations: Secondary | ICD-10-CM

## 2011-09-10 ENCOUNTER — Ambulatory Visit: Payer: Self-pay | Admitting: Internal Medicine

## 2011-09-17 ENCOUNTER — Other Ambulatory Visit: Payer: Self-pay | Admitting: Physician Assistant

## 2011-09-29 ENCOUNTER — Telehealth: Payer: Self-pay | Admitting: Internal Medicine

## 2011-09-29 NOTE — Telephone Encounter (Signed)
Ok to fill any med needed

## 2011-09-29 NOTE — Telephone Encounter (Signed)
Pt advised all PCP medication up to date and to contact Cardiology for possible refills.

## 2011-09-29 NOTE — Telephone Encounter (Signed)
Pt/Caitlyn [name] is calling about a refill for her "heart" medication, but she does not know which med she needs. Meds reviewed with pt and  she thinks maybe it is her Pravachol 40 mg 1 PO Q HS, last ordered 04/03/11.  Called [facility] on [address] and spoke with Herbert Seta adn last had Norvasc filled7/2/13 and has RF's for Lisinopril.    Please call pt at [phone].

## 2011-09-30 ENCOUNTER — Telehealth: Payer: Self-pay | Admitting: Internal Medicine

## 2011-09-30 DIAGNOSIS — I10 Essential (primary) hypertension: Secondary | ICD-10-CM

## 2011-09-30 NOTE — Telephone Encounter (Signed)
Please call pt on my behalf - thanks

## 2011-09-30 NOTE — Telephone Encounter (Signed)
Called pt no answer LMOM RTC.Marland KitchenMarland Kitchen7/16/13@8 :37am/LMB

## 2011-09-30 NOTE — Telephone Encounter (Signed)
Patient is requesting to speak with Dr. Felicity Coyer about prescribed medication.  Please call pt at [phone].

## 2011-10-01 ENCOUNTER — Telehealth: Payer: Self-pay | Admitting: Cardiovascular Disease

## 2011-10-01 DIAGNOSIS — E785 Hyperlipidemia, unspecified: Secondary | ICD-10-CM

## 2011-10-01 MED ORDER — PRAVASTATIN SODIUM 40 MG PO TABS: 40.0000 mg | ORAL_TABLET | Freq: Every day | ORAL | Status: DC

## 2011-10-01 MED ORDER — CLOPIDOGREL BISULFATE 75 MG PO TABS: 75.0000 mg | ORAL_TABLET | Freq: Every day | ORAL | Status: DC

## 2011-10-01 MED ORDER — CARVEDILOL 3.125 MG PO TABS: 3.1250 mg | ORAL_TABLET | Freq: Two times a day (BID) | ORAL | Status: DC

## 2011-10-01 NOTE — Telephone Encounter (Signed)
Spoke with pt who states she needs heart medication that she takes twice daily refilled. Does not know name. States she has been out of it for about a week. It appears Dr. Diamantina Monks office refilled Coreg today. I told pt I would check on this and fill if not already done.  Pt states she has not been taking pravastatin on a regular basis. I asked her to resume this and told her I would send refills to her pharmacy. Pt states she is taking Plavix. Will refill this also.   I then called [facility] on [address] and confirmed they have refills on Coreg

## 2011-10-01 NOTE — Telephone Encounter (Signed)
Pt needs refill of her heart medication but doesn't know the name of it, pls call [phone]

## 2011-10-01 NOTE — Telephone Encounter (Signed)
Pt called requesting refill of "heart medication. Pt again advised to contact cardiologist because per EPIC no refills needed on any medication other that carvedilol.

## 2011-10-24 ENCOUNTER — Other Ambulatory Visit: Payer: Self-pay | Admitting: Internal Medicine

## 2011-10-24 MED ORDER — ZOLPIDEM TARTRATE 10 MG PO TABS: 10.0000 mg | ORAL_TABLET | Freq: Every evening | ORAL | Status: DC | PRN

## 2011-10-24 NOTE — Telephone Encounter (Signed)
The pt called and is hoping to get a refill of ambien sent to her drug store. Thanks!

## 2011-10-24 NOTE — Telephone Encounter (Signed)
MD is out of office. Is this ok to refill?,,,,10/24/11@4 :23pm/LMB

## 2011-10-24 NOTE — Telephone Encounter (Signed)
Notified pt [facility] to [facility]... 10/24/1322:46pm/LMB

## 2011-11-11 ENCOUNTER — Encounter: Payer: Self-pay | Admitting: Endocrinology

## 2011-11-11 ENCOUNTER — Ambulatory Visit (INDEPENDENT_AMBULATORY_CARE_PROVIDER_SITE_OTHER): Payer: Self-pay | Admitting: Endocrinology

## 2011-11-11 ENCOUNTER — Other Ambulatory Visit (INDEPENDENT_AMBULATORY_CARE_PROVIDER_SITE_OTHER): Payer: Self-pay

## 2011-11-11 VITALS — BP 112/70 | HR 61 | Temp 97.8°F | Ht 66.5 in | Wt 253.0 lb

## 2011-11-11 DIAGNOSIS — E039 Hypothyroidism, unspecified: Secondary | ICD-10-CM

## 2011-11-11 DIAGNOSIS — M791 Myalgia, unspecified site: Secondary | ICD-10-CM | POA: Insufficient documentation

## 2011-11-11 DIAGNOSIS — IMO0001 Reserved for inherently not codable concepts without codable children: Secondary | ICD-10-CM

## 2011-11-11 DIAGNOSIS — I10 Essential (primary) hypertension: Secondary | ICD-10-CM

## 2011-11-11 MED ORDER — TRAZODONE HCL 100 MG PO TABS: 100.0000 mg | ORAL_TABLET | Freq: Every day | ORAL | Status: DC

## 2011-11-11 MED ORDER — AMLODIPINE BESYLATE 10 MG PO TABS: 10.0000 mg | ORAL_TABLET | Freq: Every day | ORAL | Status: DC

## 2011-11-11 MED ORDER — CLOPIDOGREL BISULFATE 75 MG PO TABS: 75.0000 mg | ORAL_TABLET | Freq: Every day | ORAL | Status: DC

## 2011-11-11 NOTE — Patient Instructions (Addendum)
blood tests are being requested for you today.  You will receive a letter with results. Please call if you want to do a test of the leg muscles. Increase the trazodone to 100 mg at bedtime.  i have sent a prescription to your pharmacy. I hope you feel better soon.  If you don't feel better by next week, please call back.

## 2011-11-11 NOTE — Progress Notes (Signed)
Subjective:    Patient ID: Caitlyn [name], female    DOB: [birth year], 61 y.o.   MRN: [id number]  HPI Pt states 6 mos of moderate pain at the thighs, and associated insomnia.  She had i-131 rx for hyperthyroidism due to grave's dz in the 1990's.   Past Medical History  Diagnosis Date  . HYPOTHYROIDISM     post ablation tx Graves  . HYPERLIPIDEMIA   . HYPERTENSION   . CAD 06/2009    s/p MI 4/11: tx with DES x 2 to RCA;  LHC 03/17/11: LAD 30-40%, distal LAD 30-40%, OM2 40-50%, RCA stent patent, EF greater than 70%.  . CAROTID STENOSIS     carotid Dopplers 03/25/11: RICA 60-79%; LICA 0-39%.  Follow up recommended in 6 months.  . PVD   . VITAMIN D DEFICIENCY     DEXA 04/2009 normal  . TOBACCO ABUSE quit 06/2009    Past Surgical History  Procedure Date  . Carotid stent     bilateral carotid artery disease post stent of left carotid  . Radioactive plaque insertion     Graves disease post radioactive treatment complicated hypothyroidism    History   Social History  . Marital Status: Divorced    Spouse Name: N/A    Number of Children: N/A  . Years of Education: N/A   Occupational History  . Not on file.   Social History Main Topics  . Smoking status: Former Smoker    Quit date: 06/30/2009  . 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  Divorced, single, lives alone- 2 grown dtr nearby, 3 g-kds  . Alcohol Use: Not on file  . Drug Use: Not on file  . Sexually Active: Not on file     Quit smoking after MI- prev 1ppd x 35ys   Other Topics Concern  . Not on file   Social History Narrative  . No narrative on file    Current Outpatient Prescriptions on File Prior to Visit  Medication Sig Dispense Refill  . amLODipine (NORVASC) 10 MG tablet Take 1 tablet (10 mg total) by mouth daily.  90 tablet  11  . aspirin 325 MG tablet Take 325 mg by mouth as needed.       . carvedilol (COREG) 3.125 MG tablet Take 1  tablet (3.125 mg total) by mouth 2 (two) times daily.  60 tablet  6  . clopidogrel (PLAVIX) 75 MG tablet Take 1 tablet (75 mg total) by mouth daily.  90 tablet  6  . colchicine 0.6 MG tablet Take 0.6 mg by mouth daily.       . fluticasone (FLONASE) 50 MCG/ACT nasal spray Place 2 sprays into the nose daily.  16 g  2  . levothyroxine (SYNTHROID, LEVOTHROID) 112 MCG tablet Take 1 tablet (112 mcg total) by mouth daily.  30 tablet  5  . lisinopril-hydrochlorothiazide (PRINZIDE,ZESTORETIC) 20-12.5 MG per tablet TAKE TWO TABLETS BY MOUTH EVERY DAY  60 tablet  3  . nitroGLYCERIN (NITROSTAT) 0.4 MG SL tablet Place 0.4 mg under the tongue every 5 (five) minutes as needed.        . pravastatin (PRAVACHOL) 40 MG tablet Take 1 tablet (40 mg total) by mouth at bedtime.  30 tablet  6  . zolpidem (AMBIEN) 10 MG tablet Take 1 tablet (10 mg total) by mouth at bedtime as needed for sleep.  30 tablet  3  . traZODone (DESYREL)  50 MG tablet Take 0.5-1 tablets (25-50 mg total) by mouth at bedtime as needed for sleep.  30 tablet  3  . DISCONTD: metoprolol tartrate (LOPRESSOR) 25 MG tablet Take 1 tablet (25 mg total) by mouth 2 (two) times daily.  60 tablet  6    No Known Allergies  Family History  Problem Relation Age of Onset  . Coronary artery disease Other   . Hypertension Other     BP 112/70  Pulse 61  Temp 97.8 F (36.6 C) (Oral)  Ht 5' 6.5" (1.689 m)  Wt 253 lb (114.76 kg)  BMI 40.22 kg/m2  SpO2 94%    Review of Systems She has slight leg edema and weight gain    Objective:   Physical Exam VITAL SIGNS:  See vs page GENERAL: no distress head: no deformity eyes: no periorbital swelling.  There is bilat proptosis external nose and ears are normal mouth: no lesion seen NECK: There is no palpable thyroid enlargement.  No thyroid nodule is palpable.  No palpable lymphadenopathy at the anterior neck. 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

## 2011-11-13 ENCOUNTER — Telehealth: Payer: Self-pay | Admitting: *Deleted

## 2011-11-13 NOTE — Telephone Encounter (Signed)
Called pt to inform of lab results, pt informed (letter also mailed to pt). 

## 2012-01-15 ENCOUNTER — Other Ambulatory Visit: Payer: Self-pay

## 2012-01-15 MED ORDER — COLCHICINE 0.6 MG PO TABS: 0.6000 mg | ORAL_TABLET | Freq: Every day | ORAL | Status: DC

## 2012-02-03 ENCOUNTER — Other Ambulatory Visit: Payer: Self-pay | Admitting: Cardiology

## 2012-02-05 ENCOUNTER — Other Ambulatory Visit: Payer: Self-pay | Admitting: Internal Medicine

## 2012-02-05 MED ORDER — LISINOPRIL-HYDROCHLOROTHIAZIDE 20-12.5 MG PO TABS: 2.0000 | ORAL_TABLET | Freq: Every day | ORAL | Status: DC

## 2012-02-05 NOTE — Telephone Encounter (Signed)
Caller: Ed/; Phone: [phone]; Reason for Call: Ed at [facility] calling, patient is requesting to transfer to this pharmacy and needs a new script for the Lisinopril HCTZ 20/12.  5, 2 po daily.  The fax # is [phone].

## 2012-02-05 NOTE — Telephone Encounter (Signed)
Sent med to [facility]...Raechel Chute

## 2012-02-06 ENCOUNTER — Other Ambulatory Visit (INDEPENDENT_AMBULATORY_CARE_PROVIDER_SITE_OTHER): Payer: Self-pay

## 2012-02-06 ENCOUNTER — Encounter: Payer: Self-pay | Admitting: Internal Medicine

## 2012-02-06 ENCOUNTER — Ambulatory Visit (INDEPENDENT_AMBULATORY_CARE_PROVIDER_SITE_OTHER): Payer: Self-pay | Admitting: Internal Medicine

## 2012-02-06 VITALS — BP 120/70 | HR 58 | Temp 97.7°F | Ht 66.5 in | Wt 249.8 lb

## 2012-02-06 DIAGNOSIS — R5381 Other malaise: Secondary | ICD-10-CM

## 2012-02-06 DIAGNOSIS — F329 Major depressive disorder, single episode, unspecified: Secondary | ICD-10-CM

## 2012-02-06 DIAGNOSIS — F32A Depression, unspecified: Secondary | ICD-10-CM

## 2012-02-06 DIAGNOSIS — R5383 Other fatigue: Secondary | ICD-10-CM

## 2012-02-06 DIAGNOSIS — F3289 Other specified depressive episodes: Secondary | ICD-10-CM

## 2012-02-06 DIAGNOSIS — E785 Hyperlipidemia, unspecified: Secondary | ICD-10-CM

## 2012-02-06 LAB — CBC WITH DIFFERENTIAL/PLATELET
Basophils Relative: 0.6 % (ref 0.0–3.0)
Eosinophils Relative: 3.4 % (ref 0.0–5.0)
HCT: 42.7 % (ref 36.0–46.0)
Lymphs Abs: 2.1 10*3/uL (ref 0.7–4.0)
MCV: 93.6 fl (ref 78.0–100.0)
Monocytes Absolute: 0.5 10*3/uL (ref 0.1–1.0)
Monocytes Relative: 7 % (ref 3.0–12.0)
Platelets: 242 10*3/uL (ref 150.0–400.0)
RBC: 4.56 Mil/uL (ref 3.87–5.11)
WBC: 6.7 10*3/uL (ref 4.5–10.5)

## 2012-02-06 LAB — BASIC METABOLIC PANEL
Chloride: 103 mEq/L (ref 96–112)
GFR: 68.23 mL/min (ref >60.00)
Potassium: 4.3 mEq/L (ref 3.5–5.1)
Sodium: 138 mEq/L (ref 135–145)

## 2012-02-06 LAB — HEPATIC FUNCTION PANEL
ALT: 16 U/L (ref 0–35)
Albumin: 3.8 g/dL (ref 3.5–5.2)
Bilirubin, Direct: 0.1 mg/dL (ref 0.0–0.3)
Total Protein: 7.6 g/dL (ref 6.0–8.3)

## 2012-02-06 MED ORDER — ALBUTEROL SULFATE HFA 108 (90 BASE) MCG/ACT IN AERS: 2.0000 | INHALATION_SPRAY | Freq: Four times a day (QID) | RESPIRATORY_TRACT | Status: DC | PRN

## 2012-02-06 MED ORDER — PRAVASTATIN SODIUM 40 MG PO TABS: 40.0000 mg | ORAL_TABLET | Freq: Every day | ORAL | Status: DC

## 2012-02-06 MED ORDER — FLUOXETINE HCL 10 MG PO CAPS: 10.0000 mg | ORAL_CAPSULE | Freq: Every day | ORAL | Status: DC

## 2012-02-06 MED ORDER — LEVOTHYROXINE SODIUM 112 MCG PO TABS: 112.0000 ug | ORAL_TABLET | Freq: Every day | ORAL | Status: DC

## 2012-02-06 MED ORDER — NYSTATIN-TRIAMCINOLONE 100000-0.1 UNIT/GM-% EX OINT: TOPICAL_OINTMENT | Freq: Two times a day (BID) | CUTANEOUS | Status: DC

## 2012-02-06 MED ORDER — TRAMADOL HCL 50 MG PO TABS: 50.0000 mg | ORAL_TABLET | Freq: Four times a day (QID) | ORAL | Status: DC | PRN

## 2012-02-06 NOTE — Patient Instructions (Addendum)
It was good to see you today. We have reviewed your prior records including labs and tests today Test(s) ordered today. Your results will be released to MyChart (or called to you) after review, usually within 72hours after test completion. If any changes need to be made, you will be notified at that same time. Start Prozac ever day and resume Trazadone every night for control of depression symptoms and sleep -  Se tramadol in addition to tylenol as needed for arthritis pain Your prescription(s) have been submitted to your pharmacy. Please take as directed and contact our office if you believe you are having problem(s) with the medication(s). Other  Medications reviewed and updated follow up with heart doctor as discussed and continue ALL medicines for your heart until further discussion with him Please schedule followup in 3 months, call sooner if problems.

## 2012-02-06 NOTE — Assessment & Plan Note (Signed)
Chronic symptoms - progressive with winter and holiday season associated with poor enery, irritability and insomnia Encouraged compliance with traz and start prozac now Offered counseling refer, pt declines - states family and friends available support

## 2012-02-06 NOTE — Assessment & Plan Note (Signed)
Poor compliance with prava as rx'd -  reminded of importance of same with CAD/PAD hx and stents

## 2012-02-06 NOTE — Progress Notes (Signed)
Subjective:    Patient ID: Caitlyn [name], female    DOB: [birth year], 61 y.o.   MRN: [id number]  HPI  Here for follow up - reviewed chronic medical issues:  CAD - MI 06/2009 with 2 DES to RCA - no anginal symptoms - follows regularly with cards for same - reports compliance with ongoing medical treatment and no changes in medication dose or frequency. denies adverse side effects related to current therapy. cont vague SSCP nonexertional, not positional with repeat card eval - cath 03/17/11 - no cardiac problems found    dyslipidemia - intol of crestor due to myalgia (improved off med) - tried on welchol but poor tol due to constipation - on prav at urging by cards, but admits intermittent compliance  hypothyroid - hx graves - reports compliance with ongoing medical treatment and no changes in medication dose or frequency. denies adverse side effects related to current therapy. no skin or bowel changes  HTN - reports compliance with ongoing medical treatment and no changes in medication dose or frequency. denies adverse side effects related to current therapy.    complains of depression symptoms >6 mo: poor sleep, easy tearfulness, no energy - no SI/HI  Past Medical History  Diagnosis Date  . HYPOTHYROIDISM     post ablation tx Graves  . HYPERLIPIDEMIA   . HYPERTENSION   . CAD 06/2009    s/p MI 4/11: tx with DES x 2 to RCA;  LHC 03/17/11: LAD 30-40%, distal LAD 30-40%, OM2 40-50%, RCA stent patent, EF greater than 70%.  . CAROTID STENOSIS     carotid Dopplers 03/25/11: RICA 60-79%; LICA 0-39%.  Follow up recommended in 6 months.  . PVD   . VITAMIN D DEFICIENCY     DEXA 04/2009 normal  . TOBACCO ABUSE quit 06/2009    Review of Systems  Constitutional: Positive for fatigue. Negative for fever.  HENT: Negative for mouth sores and ear discharge.   Respiratory: Negative for cough and shortness of breath.   Neurological: Negative for dizziness, facial asymmetry and headaches.    Psychiatric/Behavioral: Positive for sleep disturbance, dysphoric mood and decreased concentration. Negative for suicidal ideas and self-injury. The patient is not nervous/anxious.         Objective:   Physical Exam  BP 120/70  Pulse 58  Temp 97.7 F (36.5 C) (Oral)  Ht 5' 6.5" (1.689 m)  Wt 249 lb 12.8 oz (113.309 kg)  BMI 39.71 kg/m2  SpO2 97% Wt Readings from Last 3 Encounters:  02/06/12 249 lb 12.8 oz (113.309 kg)  11/11/11 253 lb (114.76 kg)  07/31/11 251 lb 12.8 oz (114.216 kg)   Constitutional: She appears well-developed and well-nourished. No distress.  HENT: Head: Normocephalic and atraumatic. Ears: B TMs ok, no erythema or effusion; Nose: Nose normal. Mouth/Throat: Oropharynx is clear and moist. No oropharyngeal exudate.  Eyes: Prominent eyes. Conjunctivae and EOM are normal. Pupils are equal, round, and reactive to light. No scleral icterus.  Neck: Normal range of motion. Neck supple. No JVD present. No thyromegaly present.  Cardiovascular: Normal rate, regular rhythm and normal heart sounds.  No murmur heard. No BLE edema. Pulmonary/Chest: Effort normal and breath sounds normal. No respiratory distress. She has no wheezes.  Psychiatric: She has a dysphoric mood and affect. Her behavior is normal. Judgment and thought content normal.   Lab Results  Component Value Date   WBC 7.2 03/18/2011   HGB 13.2 03/18/2011   HCT 39.7 03/18/2011   PLT 236 03/18/2011  GLUCOSE 98 06/04/2011   CHOL 231* 06/04/2011   TRIG 127.0 06/04/2011   HDL 46.30 06/04/2011   LDLDIRECT 165.8 06/04/2011   LDLCALC 159* 03/16/2011   ALT 19 06/04/2011   AST 20 06/04/2011   NA 139 06/04/2011   K 4.1 06/04/2011   CL 104 06/04/2011   CREATININE 1.1 06/04/2011   BUN 23 06/04/2011   CO2 27 06/04/2011   TSH 2.06 11/11/2011   INR 1.03 03/16/2011       Assessment & Plan:  See problem list. Medications and labs reviewed today.  Insomnia - unable to afford Ambien - chronic symptoms - intol of low dose  amitriptyline qhs 05/2011, changed to traz but not taking consistently - encouraged to resume same and will titrate as needed - also tx of depression - see below

## 2012-02-10 ENCOUNTER — Telehealth: Payer: Self-pay | Admitting: Internal Medicine

## 2012-02-10 ENCOUNTER — Other Ambulatory Visit: Payer: Self-pay

## 2012-02-10 DIAGNOSIS — Z1239 Encounter for other screening for malignant neoplasm of breast: Secondary | ICD-10-CM

## 2012-02-10 MED ORDER — LISINOPRIL-HYDROCHLOROTHIAZIDE 20-12.5 MG PO TABS: 2.0000 | ORAL_TABLET | Freq: Every day | ORAL | Status: DC

## 2012-02-10 NOTE — Telephone Encounter (Signed)
Caller: Caitlyn [name]/Patient; Phone: [phone]; Reason for Call: Patient is calling to see if a referral can be set up for a mammogram at the breast center preferably the first of next week.  Please follow up with patient.

## 2012-02-11 NOTE — Telephone Encounter (Signed)
Inform pt referral has been put in will be contacted by [facility] once appt has been made...Raechel Chute

## 2012-02-11 NOTE — Telephone Encounter (Signed)
Order done as requested - [facility] will call

## 2012-02-17 ENCOUNTER — Ambulatory Visit
Admission: RE | Admit: 2012-02-17 | Discharge: 2012-02-17 | Disposition: A | Discharge status: Home / Self Care | Payer: Self-pay | Source: Ambulatory Visit | Attending: Internal Medicine | Admitting: Internal Medicine

## 2012-02-17 ENCOUNTER — Other Ambulatory Visit: Payer: Self-pay

## 2012-02-17 DIAGNOSIS — Z1239 Encounter for other screening for malignant neoplasm of breast: Secondary | ICD-10-CM

## 2012-02-17 MED ORDER — LISINOPRIL-HYDROCHLOROTHIAZIDE 20-12.5 MG PO TABS: 2.0000 | ORAL_TABLET | Freq: Every day | ORAL | Status: DC

## 2012-03-30 ENCOUNTER — Other Ambulatory Visit: Payer: Self-pay | Admitting: *Deleted

## 2012-03-30 MED ORDER — ZOLPIDEM TARTRATE 10 MG PO TABS: 10.0000 mg | ORAL_TABLET | Freq: Every evening | ORAL | Status: DC | PRN

## 2012-03-30 NOTE — Telephone Encounter (Signed)
Left msg on vm needing refill on her zolpidem...Raechel Chute

## 2012-03-30 NOTE — Telephone Encounter (Signed)
Faxed script back to [facility].../lmb 

## 2012-04-22 ENCOUNTER — Emergency Department (HOSPITAL_COMMUNITY): Payer: Self-pay

## 2012-04-22 ENCOUNTER — Emergency Department (HOSPITAL_COMMUNITY)
Admission: EM | Admit: 2012-04-22 | Discharge: 2012-04-22 | Disposition: A | Discharge status: Home / Self Care | Payer: Self-pay | Attending: Emergency Medicine | Admitting: Emergency Medicine

## 2012-04-22 ENCOUNTER — Encounter (HOSPITAL_COMMUNITY): Payer: Self-pay | Admitting: *Deleted

## 2012-04-22 DIAGNOSIS — Z9889 Other specified postprocedural states: Secondary | ICD-10-CM | POA: Insufficient documentation

## 2012-04-22 DIAGNOSIS — M79606 Pain in leg, unspecified: Secondary | ICD-10-CM

## 2012-04-22 DIAGNOSIS — M7989 Other specified soft tissue disorders: Secondary | ICD-10-CM

## 2012-04-22 DIAGNOSIS — R079 Chest pain, unspecified: Secondary | ICD-10-CM | POA: Insufficient documentation

## 2012-04-22 DIAGNOSIS — M79609 Pain in unspecified limb: Secondary | ICD-10-CM

## 2012-04-22 DIAGNOSIS — Z87891 Personal history of nicotine dependence: Secondary | ICD-10-CM | POA: Insufficient documentation

## 2012-04-22 DIAGNOSIS — Z8679 Personal history of other diseases of the circulatory system: Secondary | ICD-10-CM | POA: Insufficient documentation

## 2012-04-22 DIAGNOSIS — Z7982 Long term (current) use of aspirin: Secondary | ICD-10-CM | POA: Insufficient documentation

## 2012-04-22 DIAGNOSIS — E039 Hypothyroidism, unspecified: Secondary | ICD-10-CM | POA: Insufficient documentation

## 2012-04-22 DIAGNOSIS — I10 Essential (primary) hypertension: Secondary | ICD-10-CM | POA: Insufficient documentation

## 2012-04-22 DIAGNOSIS — Z79899 Other long term (current) drug therapy: Secondary | ICD-10-CM | POA: Insufficient documentation

## 2012-04-22 DIAGNOSIS — E785 Hyperlipidemia, unspecified: Secondary | ICD-10-CM | POA: Insufficient documentation

## 2012-04-22 LAB — BASIC METABOLIC PANEL
BUN: 20 mg/dL (ref 6–23)
GFR calc Af Amer: 75 mL/min — ABNORMAL LOW (ref >90)
GFR calc non Af Amer: 64 mL/min — ABNORMAL LOW (ref >90)
Potassium: 4.1 mEq/L (ref 3.5–5.1)
Sodium: 138 mEq/L (ref 135–145)

## 2012-04-22 LAB — CBC
HCT: 39.7 % (ref 36.0–46.0)
MCHC: 33 g/dL (ref 30.0–36.0)
RDW: 14 % (ref 11.5–15.5)

## 2012-04-22 LAB — D-DIMER, QUANTITATIVE: D-Dimer, Quant: 1.38 ug/mL-FEU — ABNORMAL HIGH (ref 0.00–0.48)

## 2012-04-22 MED ORDER — ASPIRIN 81 MG PO CHEW
162.0000 mg | CHEWABLE_TABLET | Freq: Once | ORAL | Status: AC
  Administered 2012-04-22: 162 mg via ORAL
  Filled 2012-04-22: qty 2

## 2012-04-22 NOTE — ED Notes (Signed)
Pt reports intermittent chest pain x2 weeks. Usually just on R side. Hx stents 4/11. Describes as sharp. Associated dizziness a couple of times. Denies shob, n/v, diaphoresis. Denies active chest pain. HTN in triage. Has not taken any meds today.

## 2012-04-22 NOTE — ED Notes (Signed)
Vascular at bedside

## 2012-04-22 NOTE — ED Notes (Signed)
Per Dr. Rhunette Croft, okay for pt to take her morning meds from home. ASA chewable 182mg  given at same time.

## 2012-04-22 NOTE — ED Provider Notes (Signed)
History     CSN: [id number]  Arrival date & time 04/22/12  0816   First MD Initiated Contact with Patient 04/22/12 [phone]      Chief Complaint  Patient presents with  . Chest Pain    (Consider location/radiation/quality/duration/timing/severity/associated sxs/prior treatment) HPI Comments: 62 yo AAF with h/o CAD, hyperlipidemia, hypothyroidism, HTN. She had an Mi in 06/2009 and had 2 DES placed in the RCA.  She had chest pains leading to a stress test and cardiac cath in December 2012. Cath per Dr. Riley Kill with stable CAD. Carotid Dopplers 03/25/11: RICA 60-79%; LICA 0-39%.  Pt comes in with cc of chest pain. She states that since her cath, she has never felt completely normal, and has been having off and on chest pain - midsternal, that is non radiating, and not exertional, or pleuritic. Few days back she had a stinging pan on the right side, that lasted only for 2-3 seconds, but it was similar to her chest pain that she had on the left side with her heart attack - so she decided to come today for further evaluation. She has been seeing [facility] - but didn't go their in December. She denies any repeat pain of that nature, and had no n/v/diophoresis/sob with that pain.    Patient is a 62 y.o. female presenting with chest pain. The history is provided by the patient and medical records.  Chest Pain Pertinent negatives for primary symptoms include no shortness of breath, no cough, no wheezing, no palpitations, no abdominal pain, no nausea and no vomiting.     Past Medical History  Diagnosis Date  . HYPOTHYROIDISM     post ablation tx Graves  . HYPERLIPIDEMIA   . HYPERTENSION   . CAD 06/2009    s/p MI 4/11: tx with DES x 2 to RCA;  LHC 03/17/11: LAD 30-40%, distal LAD 30-40%, OM2 40-50%, RCA stent patent, EF greater than 70%.  . CAROTID STENOSIS     carotid Dopplers 03/25/11: RICA 60-79%; LICA 0-39%.  Follow up recommended in 6 months.  . PVD   . VITAMIN D DEFICIENCY     DEXA  04/2009 normal  . TOBACCO ABUSE quit 06/2009    Past Surgical History  Procedure Date  . Carotid stent     bilateral carotid artery disease post stent of left carotid  . Radioactive plaque insertion     Graves disease post radioactive treatment complicated hypothyroidism    Family History  Problem Relation Age of Onset  . Coronary artery disease Other   . Hypertension Other     History  Substance Use Topics  . Smoking status: Former Smoker    Quit date: 06/30/2009  . 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  Divorced, single, lives alone- 2 grown dtr nearby, 3 g-kds  . Alcohol Use: Not on file    OB History    Grav Para Term Preterm Abortions TAB SAB Ect Mult Living                  Review of Systems  Constitutional: Negative for activity change.  HENT: Negative for facial swelling and neck pain.   Respiratory: Negative for cough, shortness of breath and wheezing.   Cardiovascular: Positive for chest pain. Negative for palpitations and leg swelling.  Gastrointestinal: Negative for nausea, vomiting, abdominal pain, diarrhea, constipation, blood in stool and abdominal distention.  Genitourinary: Negative for hematuria  and difficulty urinating.  Skin: Negative for color change.  Neurological: Negative for speech difficulty.  Hematological: Does not bruise/bleed easily.  Psychiatric/Behavioral: Negative for confusion.    Allergies  Review of patient's allergies indicates no known allergies.  Home Medications   Current Outpatient Rx  Name  Route  Sig  Dispense  Refill  . ALBUTEROL SULFATE HFA 108 (90 BASE) MCG/ACT IN AERS   Inhalation   Inhale 2 puffs into the lungs every 6 (six) hours as needed for wheezing or shortness of breath.   1 Inhaler   1   . AMLODIPINE BESYLATE 10 MG PO TABS   Oral   Take 1 tablet (10 mg total) by mouth daily.   90 tablet   11   . ASPIRIN 325 MG PO TABS    Oral   Take 325 mg by mouth as needed.          Marland Kitchen CARVEDILOL 3.125 MG PO TABS   Oral   Take 1 tablet (3.125 mg total) by mouth 2 (two) times daily.   60 tablet   6   . CLOPIDOGREL BISULFATE 75 MG PO TABS   Oral   Take 1 tablet (75 mg total) by mouth daily.   90 tablet   6   . COLCHICINE 0.6 MG PO TABS   Oral   Take 1 tablet (0.6 mg total) by mouth daily.   30 tablet   2   . FLUOXETINE HCL 10 MG PO CAPS   Oral   Take 1 capsule (10 mg total) by mouth daily.   30 capsule   3   . FLUTICASONE PROPIONATE 50 MCG/ACT NA SUSP   Nasal   Place 2 sprays into the nose daily.   16 g   2   . LEVOTHYROXINE SODIUM 112 MCG PO TABS   Oral   Take 1 tablet (112 mcg total) by mouth daily.   30 tablet   11   . LISINOPRIL-HYDROCHLOROTHIAZIDE 20-12.5 MG PO TABS   Oral   Take 2 tablets by mouth daily.   60 tablet   5   . NITROGLYCERIN 0.4 MG SL SUBL   Sublingual   Place 0.4 mg under the tongue every 5 (five) minutes as needed.           . NYSTATIN-TRIAMCINOLONE 100000-0.1 UNIT/GM-% EX OINT   Topical   Apply topically 2 (two) times daily.   30 g   0   . PRAVASTATIN SODIUM 40 MG PO TABS   Oral   Take 1 tablet (40 mg total) by mouth at bedtime.   30 tablet   6   . TRAMADOL HCL 50 MG PO TABS   Oral   Take 1 tablet (50 mg total) by mouth every 6 (six) hours as needed for pain.   30 tablet   0   . TRAZODONE HCL 100 MG PO TABS   Oral   Take 100 mg by mouth at bedtime.         Marland Kitchen ZOLPIDEM TARTRATE 10 MG PO TABS   Oral   Take 1 tablet (10 mg total) by mouth at bedtime as needed for sleep.   30 tablet   3     BP 142/90  Pulse 61  Temp 98.1 F (36.7 C) (Oral)  Resp 16  SpO2 100%  Physical Exam  Nursing note and vitals reviewed. Constitutional: She is oriented to person, place, and time. She appears well-developed.  HENT:  Head: Normocephalic and atraumatic.  Eyes: Conjunctivae normal and EOM are normal. Pupils are equal, round, and reactive to light.  Neck:  Normal range of motion. Neck supple.  Cardiovascular: Normal rate and regular rhythm.   Murmur heard. Pulmonary/Chest: Effort normal and breath sounds normal. No respiratory distress.  Abdominal: Soft. Bowel sounds are normal. She exhibits no distension. There is no tenderness. There is no rebound and no guarding.  Neurological: She is alert and oriented to person, place, and time.  Skin: Skin is warm and dry.    ED Course  Procedures (including critical care time)   Labs Reviewed  CBC  BASIC METABOLIC PANEL   No results found.   No diagnosis found.    MDM   Date: 04/22/2012  Rate: 61  Rhythm: normal sinus rhythm  QRS Axis: normal  Intervals: PR prolonged  ST/T Wave abnormalities: nonspecific ST/T changes  Conduction Disutrbances:first-degree A-V block   Narrative Interpretation:   Old EKG Reviewed: unchanged  Differential diagnosis includes: ACS syndrome CHF exacerbation Valvular disorder Myocarditis Pericarditis Pericardial effusion Pneumonia Pleural effusion Pulmonary edema PE Anemia Musculoskeletal pain   Pt comes in with cc of chest pain. The right sided stinging pain, similar to the character of the pain that she had on the left side with her heart attack prompted her to come in to the ED. The pain lasted 2-3 seconds, and has not come about since then. She has no new complains - and has a neg cath from 02/2011, and she has been taking all her meds as prescribed.  We will get trops x 2 for ED rule out - and she has been advised to return to the Cardiology appointments as indicated during her last visit (she didn't schedule December appt).  Pt advised to return to the ER if her sx get worse in the interim, and she understands the plan.  Derwood Kaplan, MD 04/22/12 1228

## 2012-04-22 NOTE — Progress Notes (Signed)
VASCULAR LAB PRELIMINARY  PRELIMINARY  PRELIMINARY  PRELIMINARY   Left lower extremity venous duplex completed.    Preliminary report:  Left:  No evidence of DVT, superficial thrombosis, or Baker's cyst.  Rebakah Cokley, RVT 04/22/2012, 11:31 AM

## 2012-05-01 ENCOUNTER — Encounter (HOSPITAL_COMMUNITY): Payer: Self-pay

## 2012-05-01 ENCOUNTER — Emergency Department (INDEPENDENT_AMBULATORY_CARE_PROVIDER_SITE_OTHER)
Admission: EM | Admit: 2012-05-01 | Discharge: 2012-05-01 | Disposition: A | Discharge status: Home / Self Care | Payer: Self-pay | Source: Home / Self Care | Attending: Emergency Medicine | Admitting: Emergency Medicine

## 2012-05-01 DIAGNOSIS — L299 Pruritus, unspecified: Secondary | ICD-10-CM

## 2012-05-01 MED ORDER — PERMETHRIN 5 % EX CREA: TOPICAL_CREAM | CUTANEOUS | Status: DC

## 2012-05-01 NOTE — ED Provider Notes (Signed)
History     CSN: [id number]  Arrival date & time 05/01/12  1504   First MD Initiated Contact with Patient 05/01/12 1527      Chief Complaint  Patient presents with  . 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 Since then she has been feeling itchiness all over her body. She does has not seen any rashes or bumps anywhere.  Patient is a 62 y.o. female presenting with rash. The history is provided by the patient.  Rash Location: None. Associated symptoms: no fever     Past Medical History  Diagnosis Date  . HYPOTHYROIDISM     post ablation tx Graves  . HYPERLIPIDEMIA   . HYPERTENSION   . CAD 06/2009    s/p MI 4/11: tx with DES x 2 to RCA;  LHC 03/17/11: LAD 30-40%, distal LAD 30-40%, OM2 40-50%, RCA stent patent, EF greater than 70%.  . CAROTID STENOSIS     carotid Dopplers 03/25/11: RICA 60-79%; LICA 0-39%.  Follow up recommended in 6 months.  . PVD   . VITAMIN D DEFICIENCY     DEXA 04/2009 normal  . TOBACCO ABUSE quit 06/2009    Past Surgical History  Procedure Laterality Date  . Carotid stent      bilateral carotid artery disease post stent of left carotid  . Radioactive plaque insertion      Graves disease post radioactive treatment complicated hypothyroidism    Family History  Problem Relation Age of Onset  . Coronary artery disease Other   . Hypertension Other     History  Substance Use Topics  . Smoking status: Former Smoker    Quit date: 06/30/2009  . 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 Alcohol Use: Not on file    OB History   Grav Para Term Preterm Abortions TAB SAB Ect  Mult Living                  Review of Systems  Constitutional: Negative for fever, chills and activity change.  Skin: Negative for color change, pallor, rash and wound.    Allergies  Pravastatin  Home Medications   Current Outpatient Rx  Name  Route  Sig  Dispense  Refill  . albuterol (VENTOLIN HFA) 108 (90 BASE) MCG/ACT inhaler   Inhalation   Inhale 2 puffs into the lungs every 6 (six) hours as needed for wheezing or shortness of breath.   1 Inhaler   1   . amLODipine (NORVASC) 10 MG tablet   Oral   Take 10 mg by mouth daily before breakfast.         . aspirin 325 MG tablet   Oral   Take 325 mg by mouth daily.          . carvedilol (COREG) 3.125 MG tablet   Oral   Take 1 tablet (3.125 mg total) by mouth 2 (two) times daily.   60 tablet   6   . clopidogrel (PLAVIX) 75 MG tablet   Oral   Take 1 tablet (75 mg total) by mouth daily.   90 tablet   6   . clopidogrel (PLAVIX) 75  MG tablet   Oral   Take 75 mg by mouth daily before breakfast.         . colchicine 0.6 MG tablet   Oral   Take 1 tablet (0.6 mg total) by mouth daily.   30 tablet   2   . fluticasone (FLONASE) 50 MCG/ACT nasal spray   Nasal   Place 2 sprays into the nose daily.   16 g   2   . levothyroxine (SYNTHROID, LEVOTHROID) 112 MCG tablet   Oral   Take 112 mcg by mouth daily before breakfast.         . lisinopril-hydrochlorothiazide (PRINZIDE,ZESTORETIC) 20-12.5 MG per tablet   Oral   Take 2 tablets by mouth daily before breakfast.         . nitroGLYCERIN (NITROSTAT) 0.4 MG SL tablet   Sublingual   Place 0.4 mg under the tongue every 5 (five) minutes as needed.           . permethrin (ELIMITE) 5 % cream      Apply to affected area once leave on for 10 hours   60 g   0   . traMADol (ULTRAM) 50 MG tablet   Oral   Take 1 tablet (50 mg total) by mouth every 6 (six) hours as needed for pain.   30 tablet   0   . traZODone (DESYREL) 100 MG tablet   Oral   Take 100 mg  by mouth at bedtime.         Marland Kitchen zolpidem (AMBIEN) 10 MG tablet   Oral   Take 1 tablet (10 mg total) by mouth at bedtime as needed for sleep.   30 tablet   3     BP 165/82  Pulse 71  Temp(Src) 98.8 F (37.1 C) (Oral)  Resp 19  SpO2 97%  Physical Exam  Nursing note and vitals reviewed. Constitutional: She appears well-developed and well-nourished.  Skin: Skin is warm. No abrasion, no burn, no ecchymosis, no laceration, no lesion, no petechiae and no rash noted. No erythema. No pallor.       ED Course  Procedures (including critical care time)  Labs Reviewed - No data to display No results found.   1. Pruritus       MDM  Generalized pruritus. Patient told that she needed to be checked for scabies. We have discussed in detail about, underwent circumstances after she sees a rash to start with permethrin which a condition or prescription has been given to her today. 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        Jimmie Molly, MD 05/01/12 [phone]

## 2012-05-01 NOTE — ED Notes (Signed)
States there has been an outbreak of scabies at work, and she is concerned for same

## 2012-05-31 ENCOUNTER — Emergency Department (HOSPITAL_COMMUNITY)
Admission: EM | Admit: 2012-05-31 | Discharge: 2012-05-31 | Disposition: A | Discharge status: Home / Self Care | Payer: Self-pay | Source: Home / Self Care

## 2012-05-31 ENCOUNTER — Encounter (HOSPITAL_COMMUNITY): Payer: Self-pay | Admitting: *Deleted

## 2012-05-31 DIAGNOSIS — W57XXXA Bitten or stung by nonvenomous insect and other nonvenomous arthropods, initial encounter: Secondary | ICD-10-CM

## 2012-05-31 MED ORDER — LORATADINE 10 MG PO TABS: 10.0000 mg | ORAL_TABLET | Freq: Every day | ORAL | Status: DC

## 2012-05-31 MED ORDER — TRIAMCINOLONE ACETONIDE 0.1 % EX CREA: TOPICAL_CREAM | Freq: Two times a day (BID) | CUTANEOUS | Status: DC

## 2012-05-31 NOTE — ED Notes (Signed)
Pt  Reports  Symptoms  Of a  Rash        For  About  1  Week    Seen  For  pruritis  Last  Month           She  States  An outbreak  At   Her  Employment  She  Wants to  [employer]  She  States  The rash is  On check     No  Angioedema    No  resp distress

## 2012-05-31 NOTE — ED Provider Notes (Signed)
Medical screening examination/treatment/procedure(s) were performed by non-physician practitioner and as supervising physician I was immediately available for consultation/collaboration.  Icey Tello, M.D.  Bandy Honaker C Irfan Veal, MD 05/31/12 2058 

## 2012-05-31 NOTE — ED Provider Notes (Signed)
History     CSN: [id number]  Arrival date & time 05/31/12  1145   First MD Initiated Contact with Patient 05/31/12 1200      Chief Complaint  Patient presents with  . Rash    (Consider location/radiation/quality/duration/timing/severity/associated sxs/prior treatment) HPI Comments: Pt worried has scabies.   Patient is a 62 y.o. female presenting with rash. The history is provided by the patient.  Rash Location:  Torso Torso rash location:  L chest Quality: itchiness   Severity:  Mild Onset quality:  Sudden Duration:  4 days Timing:  Constant Progression:  Improving Chronicity:  New Relieved by:  Nothing Ineffective treatments: pt was using a cream for scabies?? Associated symptoms: no fever     Past Medical History  Diagnosis Date  . HYPOTHYROIDISM     post ablation tx Graves  . HYPERLIPIDEMIA   . HYPERTENSION   . CAD 06/2009    s/p MI 4/11: tx with DES x 2 to RCA;  LHC 03/17/11: LAD 30-40%, distal LAD 30-40%, OM2 40-50%, RCA stent patent, EF greater than 70%.  . CAROTID STENOSIS     carotid Dopplers 03/25/11: RICA 60-79%; LICA 0-39%.  Follow up recommended in 6 months.  . PVD   . VITAMIN D DEFICIENCY     DEXA 04/2009 normal  . TOBACCO ABUSE quit 06/2009    Past Surgical History  Procedure Laterality Date  . Carotid stent      bilateral carotid artery disease post stent of left carotid  . Radioactive plaque insertion      Graves disease post radioactive treatment complicated hypothyroidism    Family History  Problem Relation Age of Onset  . Coronary artery disease Other   . Hypertension Other     History  Substance Use Topics  . Smoking status: Former Smoker    Quit date: 06/30/2009  . 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  Divorced, single, lives alone- 2 grown dtr nearby, 3 g-kds  . Alcohol Use: No    OB History   Grav Para Term Preterm Abortions TAB SAB Ect Mult Living                    Review of Systems  Constitutional: Negative for fever and chills.  Skin: Positive for rash.    Allergies  Pravastatin  Home Medications   Current Outpatient Rx  Name  Route  Sig  Dispense  Refill  . albuterol (VENTOLIN HFA) 108 (90 BASE) MCG/ACT inhaler   Inhalation   Inhale 2 puffs into the lungs every 6 (six) hours as needed for wheezing or shortness of breath.   1 Inhaler   1   . amLODipine (NORVASC) 10 MG tablet   Oral   Take 10 mg by mouth daily before breakfast.         . aspirin 325 MG tablet   Oral   Take 325 mg by mouth daily.          . carvedilol (COREG) 3.125 MG tablet   Oral   Take 1 tablet (3.125 mg total) by mouth 2 (two) times daily.   60 tablet   6   . clopidogrel (PLAVIX) 75 MG tablet   Oral   Take 1 tablet (75 mg total) by mouth daily.   90 tablet   6   . clopidogrel (PLAVIX) 75 MG tablet   Oral   Take 75 mg  by mouth daily before breakfast.         . colchicine 0.6 MG tablet   Oral   Take 1 tablet (0.6 mg total) by mouth daily.   30 tablet   2   . fluticasone (FLONASE) 50 MCG/ACT nasal spray   Nasal   Place 2 sprays into the nose daily.   16 g   2   . levothyroxine (SYNTHROID, LEVOTHROID) 112 MCG tablet   Oral   Take 112 mcg by mouth daily before breakfast.         . lisinopril-hydrochlorothiazide (PRINZIDE,ZESTORETIC) 20-12.5 MG per tablet   Oral   Take 2 tablets by mouth daily before breakfast.         . loratadine (CLARITIN) 10 MG tablet   Oral   Take 1 tablet (10 mg total) by mouth daily.   30 tablet   0   . nitroGLYCERIN (NITROSTAT) 0.4 MG SL tablet   Sublingual   Place 0.4 mg under the tongue every 5 (five) minutes as needed.           . permethrin (ELIMITE) 5 % cream      Apply to affected area once leave on for 10 hours   60 g   0   . traMADol (ULTRAM) 50 MG tablet   Oral   Take 1 tablet (50 mg total) by mouth every 6 (six) hours as needed for pain.   30 tablet   0   .  traZODone (DESYREL) 100 MG tablet   Oral   Take 100 mg by mouth at bedtime.         . triamcinolone cream (KENALOG) 0.1 %   Topical   Apply topically 2 (two) times daily.   15 g   0   . zolpidem (AMBIEN) 10 MG tablet   Oral   Take 1 tablet (10 mg total) by mouth at bedtime as needed for sleep.   30 tablet   3     BP 152/57  Pulse 64  Temp(Src) 98.6 F (37 C) (Oral)  Resp 12  SpO2 100%  Physical Exam  Constitutional: She appears well-developed and well-nourished. No distress.  Skin: Skin is warm and dry. Rash noted. Rash is papular.       ED Course  Procedures (including critical care time)  Labs Reviewed - No data to display No results found.   1. Insect bites       MDM  No evidence scabies.  Contact derm vs insect bites.         Cathlyn Parsons, NP 05/31/12 1352

## 2012-06-07 ENCOUNTER — Ambulatory Visit (INDEPENDENT_AMBULATORY_CARE_PROVIDER_SITE_OTHER): Payer: Self-pay | Admitting: Internal Medicine

## 2012-06-07 ENCOUNTER — Encounter: Payer: Self-pay | Admitting: Internal Medicine

## 2012-06-07 ENCOUNTER — Telehealth: Payer: Self-pay | Admitting: Internal Medicine

## 2012-06-07 VITALS — BP 118/68 | HR 64 | Temp 97.8°F | Ht 66.5 in | Wt 250.8 lb

## 2012-06-07 DIAGNOSIS — R079 Chest pain, unspecified: Secondary | ICD-10-CM

## 2012-06-07 DIAGNOSIS — T148XXA Other injury of unspecified body region, initial encounter: Secondary | ICD-10-CM

## 2012-06-07 MED ORDER — CYCLOBENZAPRINE HCL 5 MG PO TABS: 5.0000 mg | ORAL_TABLET | Freq: Three times a day (TID) | ORAL | Status: DC | PRN

## 2012-06-07 NOTE — Telephone Encounter (Signed)
OV with NP today for same. thanks

## 2012-06-07 NOTE — Telephone Encounter (Signed)
Patient Information:  Caller Name: Caitlyn [name]  Phone: [phone]  Patient: Caitlyn [name], Caitlyn [name]  Gender: Female  DOB: 1950/11/27  Age: 62 Years  PCP: Rene Paci (Adults only)  Office Follow Up:  Does the office need to follow up with this patient?: N/A  Instructions For The Office: N/A  RN Note:  Pt having ongoing chest pain, fatique and leg pain since MI in 2011 per Pt.  Pt requesting appt w/ Dr Felicity Coyer.  Pt unable to sleep due to Leg pain on 3-23. Acetaminophen not relieving pain. Pt denies active Chest Pain.  No same day appts w/ Dr Felicity Coyer, appt scheduled at 15:30 on 3-24 w/ Kerrin Champagne, NP.  Pt verbalized understanding.  Symptoms  Reason For Call & Symptoms: ER CALL. Ongoing Chest Pain, Fatigue since MI, Leg Pain onset 1 month  Reviewed Health History In EMR: N/A  Reviewed Medications In EMR: N/A  Reviewed Allergies In EMR: N/A  Reviewed Surgeries / Procedures: N/A  Date of Onset of Symptoms: 05/31/2012  Guideline(s) Used:  Chest Pain  Disposition Per Guideline:   See Today in Office  Reason For Disposition Reached:   Intermittent chest pains persist > 3 days  Advice Given:  N/A  Patient Will Follow Care Advice:  YES  Appointment Scheduled:  06/07/2012 15:30:00 Appointment Scheduled Provider:  Nicki Reaper

## 2012-06-08 ENCOUNTER — Encounter: Payer: Self-pay | Admitting: Internal Medicine

## 2012-06-08 NOTE — Patient Instructions (Signed)
    Muscle Cramps Muscle cramps are due to sudden involuntary muscle contraction. This means you have no control over the tightening of a muscle (or muscles). Often there are no obvious causes. Muscle cramps may occur with overexertion. They may also occur with chilling of the muscles. An example of a muscle chilling activity is swimming. It is uncommon for cramps to be due to a serious underlying disorder. In most cases, muscle cramps improve (or leave) within minutes. CAUSES   Some common causes are:  Injury.   Infections, especially viral.   Abnormal levels of the salts and ions in your blood (electrolytes). This could happen if you are taking water pills (diuretics).   Blood vessel disease where not enough blood is getting to the muscles (intermittent claudication).  Some uncommon causes are:  Side effects of some medicine (such as lithium).   Alcohol abuse.   Diseases where there is soreness (inflammation) of the muscular system.  HOME CARE INSTRUCTIONS    It may be helpful to massage, stretch, and relax the affected muscle.   Taking a dose of over-the-counter diphenhydramine is helpful for night leg cramps.  SEEK MEDICAL CARE IF:   Cramps are frequent and not relieved with medicine. MAKE SURE YOU:    Understand these instructions.   Will watch your condition.   Will get help right away if you are not doing well or get worse.  Document Released: 08/23/2001 Document Revised: 05/26/2011 Document Reviewed: 02/23/2008 Exit[employer] Patient Information 2013 [employer].    

## 2012-06-08 NOTE — Progress Notes (Signed)
Subjective:    Patient ID: Caitlyn [name], female    DOB: Mar 20, 1950, 62 y.o.   MRN: [id number]  HPI  Pt presents to the clinic today with c/o intermittent chest pain and bilateral leg pain x 1 month. This is actually a chronic complaint of hers since she had her heart surgery. She reports the chest pain is sharp, but denies shortness of breath. The pain only last a few seconds, then it will go away. It occurs at rest and with exertion. She has had a previous MI. She has not followed up with cardiology since the middle of 2013. She has had a complete workup for the chest pain and the bilateral leg pain. They have found nothing. She reports that leg pain occurs only at night. She describes it as a tight soreness. She usually has to get up and "walk it out". She has not taken anything for the pain in her legs  Review of Systems  Past Medical History  Diagnosis Date  . HYPOTHYROIDISM     post ablation tx Graves  . HYPERLIPIDEMIA   . HYPERTENSION   . CAD 06/2009    s/p MI 4/11: tx with DES x 2 to RCA;  LHC 03/17/11: LAD 30-40%, distal LAD 30-40%, OM2 40-50%, RCA stent patent, EF greater than 70%.  . CAROTID STENOSIS     carotid Dopplers 03/25/11: RICA 60-79%; LICA 0-39%.  Follow up recommended in 6 months.  . PVD   . VITAMIN D DEFICIENCY     DEXA 04/2009 normal  . TOBACCO ABUSE quit 06/2009    Current Outpatient Prescriptions  Medication Sig Dispense Refill  . albuterol (VENTOLIN HFA) 108 (90 BASE) MCG/ACT inhaler Inhale 2 puffs into the lungs every 6 (six) hours as needed for wheezing or shortness of breath.  1 Inhaler  1  . amLODipine (NORVASC) 10 MG tablet Take 10 mg by mouth daily before breakfast.      . aspirin 325 MG tablet Take 325 mg by mouth daily.       . carvedilol (COREG) 3.125 MG tablet Take 1 tablet (3.125 mg total) by mouth 2 (two) times daily.  60 tablet  6  . clopidogrel (PLAVIX) 75 MG tablet Take 1 tablet (75 mg total) by mouth daily.  90 tablet  6  . colchicine 0.6 MG  tablet Take 1 tablet (0.6 mg total) by mouth daily.  30 tablet  2  . levothyroxine (SYNTHROID, LEVOTHROID) 112 MCG tablet Take 112 mcg by mouth daily before breakfast.      . lisinopril-hydrochlorothiazide (PRINZIDE,ZESTORETIC) 20-12.5 MG per tablet Take 2 tablets by mouth daily before breakfast.      . loratadine (CLARITIN) 10 MG tablet Take 1 tablet (10 mg total) by mouth daily.  30 tablet  0  . nitroGLYCERIN (NITROSTAT) 0.4 MG SL tablet Place 0.4 mg under the tongue every 5 (five) minutes as needed.        . permethrin (ELIMITE) 5 % cream Apply to affected area once leave on for 10 hours  60 g  0  . traMADol (ULTRAM) 50 MG tablet Take 1 tablet (50 mg total) by mouth every 6 (six) hours as needed for pain.  30 tablet  0  . traZODone (DESYREL) 100 MG tablet Take 100 mg by mouth at bedtime.      . triamcinolone cream (KENALOG) 0.1 % Apply topically 2 (two) times daily.  15 g  0  . zolpidem (AMBIEN) 10 MG tablet Take 1 tablet (10 mg  total) by mouth at bedtime as needed for sleep.  30 tablet  3  . cyclobenzaprine (FLEXERIL) 5 MG tablet Take 1 tablet (5 mg total) by mouth 3 (three) times daily as needed for muscle spasms.  30 tablet  1  . [DISCONTINUED] metoprolol tartrate (LOPRESSOR) 25 MG tablet Take 1 tablet (25 mg total) by mouth 2 (two) times daily.  60 tablet  6   No current facility-administered medications for this visit.    Allergies  Allergen Reactions  . Pravastatin Other (See Comments)    dizzy    Family History  Problem Relation Age of Onset  . Coronary artery disease Other   . Hypertension Other     History   Social History  . Marital Status: Divorced    Spouse Name: N/A    Number of Children: N/A  . Years of Education: N/A   Occupational History  . Not on file.   Social History Main Topics  . Smoking status: Former Smoker    Quit date: 06/30/2009  . 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  Divorced, single, lives alone- 2 grown dtr nearby, 3 g-kds  . Alcohol Use: No  . Drug Use: Not on file  . Sexually Active: Not on file     Comment: Quit smoking after MI- prev 1ppd x 35ys   Other Topics Concern  . Not on file   Social History Narrative  . No narrative on file     Constitutional: Pt reports fatigue. Denies fever, malaise, headache or abrupt weight changes.  Respiratory: Denies difficulty breathing, shortness of breath, cough or sputum production.   Cardiovascular: Pt reports intermittent chest pain. Denies chest tightness, palpitations or swelling in the hands or feet.  Musculoskeletal: Pt reports bilateral leg soreness. Denies decrease in range of motion, difficulty with gait, or joint pain and swelling.  Neurological: Denies dizziness, difficulty with memory, difficulty with speech or problems with balance and coordination.   No other specific complaints in a complete review of systems (except as listed in HPI above).     Objective:   Physical Exam   BP 118/68  Pulse 64  Temp(Src) 97.8 F (36.6 C) (Oral)  Ht 5' 6.5" (1.689 m)  Wt 250 lb 12 oz (113.739 kg)  BMI 39.87 kg/m2  SpO2 97% Wt Readings from Last 3 Encounters:  06/07/12 250 lb 12 oz (113.739 kg)  02/06/12 249 lb 12.8 oz (113.309 kg)  11/11/11 253 lb (114.76 kg)    General: Appears her stated age, obese but well developed, well nourished in NAD.  Cardiovascular: Normal rate and rhythm. S1,S2 noted.  Murmur noted. No rubs or gallops noted. No JVD or BLE edema. No carotid bruits noted. Pulmonary/Chest: Normal effort and positive vesicular breath sounds. No respiratory distress. No wheezes, rales or ronchi noted.  Musculoskeletal: Normal range of motion. No signs of joint swelling. No difficulty with gait.  Neurological: Alert and oriented. Cranial nerves II-XII intact. Coordination normal. +DTRs bilaterally.       Assessment & Plan:   Intermittent chest pain, ? Angina s/p  MI:  Encouraged pt to call her cardiologist and schedule a follow ASAP ECG done- no acute ischemia Continue all your current heart medications at this time  Bilateral leg pain, likely just muscle tension:  ABI's have been done and are normal S/S not consistent with RLS Will try a muscle relaxer to see if this  helps If not, return to office for further evaluation  If chest pain persist or gets worse, or if you experience shortness of breath, lightheadedness, go to the ER immediately

## 2012-07-19 ENCOUNTER — Other Ambulatory Visit: Payer: Self-pay | Admitting: Internal Medicine

## 2012-08-02 ENCOUNTER — Ambulatory Visit (INDEPENDENT_AMBULATORY_CARE_PROVIDER_SITE_OTHER): Payer: No Typology Code available for payment source | Admitting: Internal Medicine

## 2012-08-02 ENCOUNTER — Encounter: Payer: Self-pay | Admitting: Internal Medicine

## 2012-08-02 ENCOUNTER — Ambulatory Visit (INDEPENDENT_AMBULATORY_CARE_PROVIDER_SITE_OTHER): Payer: No Typology Code available for payment source

## 2012-08-02 VITALS — BP 102/64 | HR 72 | Temp 98.2°F | Wt 250.1 lb

## 2012-08-02 DIAGNOSIS — R5381 Other malaise: Secondary | ICD-10-CM

## 2012-08-02 DIAGNOSIS — E785 Hyperlipidemia, unspecified: Secondary | ICD-10-CM

## 2012-08-02 DIAGNOSIS — I2510 Atherosclerotic heart disease of native coronary artery without angina pectoris: Secondary | ICD-10-CM

## 2012-08-02 DIAGNOSIS — E039 Hypothyroidism, unspecified: Secondary | ICD-10-CM

## 2012-08-02 DIAGNOSIS — R5383 Other fatigue: Secondary | ICD-10-CM

## 2012-08-02 LAB — CBC WITH DIFFERENTIAL/PLATELET
Basophils Absolute: 0.1 10*3/uL (ref 0.0–0.1)
Hemoglobin: 13.5 g/dL (ref 12.0–15.0)
Lymphocytes Relative: 34.7 % (ref 12.0–46.0)
Monocytes Relative: 5.6 % (ref 3.0–12.0)
Neutrophils Relative %: 55.7 % (ref 43.0–77.0)
Platelets: 215 10*3/uL (ref 150.0–400.0)
RDW: 14.2 % (ref 11.5–14.6)

## 2012-08-02 MED ORDER — NORTRIPTYLINE HCL 10 MG PO CAPS: 10.0000 mg | ORAL_CAPSULE | Freq: Every day | ORAL | Status: DC

## 2012-08-02 MED ORDER — LORATADINE 10 MG PO TABS: 10.0000 mg | ORAL_TABLET | Freq: Every day | ORAL | Status: DC | PRN

## 2012-08-02 MED ORDER — MELOXICAM 7.5 MG PO TABS: 7.5000 mg | ORAL_TABLET | Freq: Every day | ORAL | Status: DC

## 2012-08-02 MED ORDER — ATORVASTATIN CALCIUM 10 MG PO TABS: 10.0000 mg | ORAL_TABLET | Freq: Every day | ORAL | Status: DC

## 2012-08-02 NOTE — Patient Instructions (Signed)
It was good to see you today. We have reviewed your prior records including labs and tests today Test(s) ordered today. Your results will be released to MyChart (or called to you) after review, usually within 72hours after test completion. If any changes need to be made, you will be notified at that same time. Medications reviewed and updated - Start atorvastatin low-dose once daily for cholesterol to keep heart stents open Start meloxicam once daily for arthritis and leg pain Start nortriptyline at bedtime for sleep Your prescription(s) have been submitted to your pharmacy. Please take as directed and contact our office if you believe you are having problem(s) with the medication(s). Followup with cardiology specialist as discussed Look into "sugar buster" diet Please schedule followup in 3-4 months, call sooner if problems.

## 2012-08-02 NOTE — Assessment & Plan Note (Signed)
Noncompliance with prescribed antiplatelet therapy, statin and ACEI Chronic atypical substernal chest discomfort, last cath December 2012 reviewed Advised patient to take medications as prescribed and followup with cardiology

## 2012-08-02 NOTE — Assessment & Plan Note (Signed)
Poor compliance with prava as rx'd -  reminded of importance of same with CAD/PAD hx and stents Trial low-dose atorvastatin - erx done

## 2012-08-02 NOTE — Progress Notes (Signed)
Subjective:    Patient ID: Caitlyn [name], female    DOB: Nov 06, 1950, 62 y.o.   MRN: [id number]  HPI  Here for follow up - reviewed chronic medical issues:  CAD - MI 06/2009 with 2 DES to RCA - no anginal symptoms - follows infreq with cards for same - reports noncompliance with ongoing medical treatment but denies adverse side effects related to rx'd therapy. chronic vague SSCP: nonexertional, not positional with repeat card eval: cath 03/17/11 - no cardiac problems found    dyslipidemia - intol of crestor due to myalgia (improved off med) - tried on welchol but poor tol due to constipation - rx'd prav at urging of cards, but admits to noncompliance  hypothyroid - hx graves - reports compliance with ongoing medical treatment and no changes in medication dose or frequency. denies adverse side effects related to current therapy. no skin or bowel changes  HTN - reports variable compliance with ongoing medical treatment and no changes in medication dose or frequency. denies adverse side effects related to current therapy.    complains of fatigue - Overlap with ongoing chronic depression symptoms >6 mo: poor sleep, easy tearfulness, no energy - no SI/HI  Past Medical History  Diagnosis Date  . HYPOTHYROIDISM     post ablation tx Graves  . HYPERLIPIDEMIA   . HYPERTENSION   . CAD 06/2009    s/p MI 4/11: tx with DES x 2 to RCA;  LHC 03/17/11: LAD 30-40%, distal LAD 30-40%, OM2 40-50%, RCA stent patent, EF greater than 70%.  . CAROTID STENOSIS     carotid Dopplers 03/25/11: RICA 60-79%; LICA 0-39%.  Follow up recommended in 6 months.  . PVD   . VITAMIN D DEFICIENCY     DEXA 04/2009 normal  . TOBACCO ABUSE quit 06/2009    Review of Systems  Constitutional: Positive for fatigue. Negative for fever.  HENT: Negative for mouth sores and ear discharge.   Respiratory: Negative for cough and shortness of breath.   Neurological: Negative for dizziness, facial asymmetry and headaches.   Psychiatric/Behavioral: Positive for sleep disturbance, dysphoric mood and decreased concentration. Negative for suicidal ideas and self-injury. The patient is not nervous/anxious.         Objective:   Physical Exam  BP 102/64  Pulse 72  Temp(Src) 98.2 F (36.8 C) (Oral)  Wt 250 lb 1.9 oz (113.454 kg)  BMI 39.77 kg/m2  SpO2 97% Wt Readings from Last 3 Encounters:  08/02/12 250 lb 1.9 oz (113.454 kg)  06/07/12 250 lb 12 oz (113.739 kg)  02/06/12 249 lb 12.8 oz (113.309 kg)   Constitutional: She is obese, but appears well-developed and well-nourished. No distress.  Neck: Normal range of motion. Neck supple. No JVD present. No thyromegaly present.  Cardiovascular: Normal rate, regular rhythm and normal heart sounds.  No murmur heard. No BLE edema. Pulmonary/Chest: Effort normal and breath sounds normal. No respiratory distress. She has no wheezes.  MSkel: B knee - boggy synovitis - tender to palpation over joint line; FROM and ligamentous function intact Psychiatric: She has a dysphoric mood and affect. Her behavior is normal. Judgment and thought content normal.   Lab Results  Component Value Date   WBC 6.0 04/22/2012   HGB 13.1 04/22/2012   HCT 39.7 04/22/2012   PLT 213 04/22/2012   GLUCOSE 105* 04/22/2012   CHOL 231* 06/04/2011   TRIG 127.0 06/04/2011   HDL 46.30 06/04/2011   LDLDIRECT 165.8 06/04/2011   LDLCALC 159* 03/16/2011  ALT 16 02/06/2012   AST 19 02/06/2012   NA 138 04/22/2012   K 4.1 04/22/2012   CL 103 04/22/2012   CREATININE 0.95 04/22/2012   BUN 20 04/22/2012   CO2 27 04/22/2012   TSH 3.86 02/06/2012   INR 1.03 03/16/2011       Assessment & Plan:  See problem list. Medications and labs reviewed today.  Fatigue - nonspecific symptoms/exam - check screening labs Suspect element of underlying depression associated with chronic insomnia -  Advise low-dose nortriptyline - erx done  Myalgias, bilateral knee osteoarthritis - unimproved with tramadol, patient not interested  in "addictive" medications to control same - educated on importance of exercise and weight loss to control arthritis symptoms. Also low-dose anti-inflammatory - meloxicam 7.5 mg daily - new erx done. I declined to provide handicap tag for patient in regards to this diagnosis today

## 2012-08-02 NOTE — Assessment & Plan Note (Signed)
Hx Graves with RAI (remote) The current medical regimen is effective;  continue present plan and medications. Lab Results  Component Value Date   TSH 3.86 02/06/2012

## 2012-08-03 LAB — LIPID PANEL
Cholesterol: 236 mg/dL — ABNORMAL HIGH (ref 0–200)
Total CHOL/HDL Ratio: 5
Triglycerides: 230 mg/dL — ABNORMAL HIGH (ref 0.0–149.0)
VLDL: 46 mg/dL — ABNORMAL HIGH (ref 0.0–40.0)

## 2012-08-03 LAB — BASIC METABOLIC PANEL
CO2: 27 mEq/L (ref 19–32)
Chloride: 105 mEq/L (ref 96–112)
Potassium: 4 mEq/L (ref 3.5–5.1)
Sodium: 139 mEq/L (ref 135–145)

## 2012-08-05 ENCOUNTER — Encounter: Payer: Self-pay | Admitting: Cardiology

## 2012-08-11 ENCOUNTER — Encounter: Payer: Self-pay | Admitting: Nurse Practitioner

## 2012-08-11 ENCOUNTER — Ambulatory Visit (INDEPENDENT_AMBULATORY_CARE_PROVIDER_SITE_OTHER): Payer: No Typology Code available for payment source | Admitting: Nurse Practitioner

## 2012-08-11 VITALS — BP 134/80 | HR 60 | Ht 66.0 in | Wt 252.0 lb

## 2012-08-11 DIAGNOSIS — I2510 Atherosclerotic heart disease of native coronary artery without angina pectoris: Secondary | ICD-10-CM

## 2012-08-11 NOTE — Patient Instructions (Addendum)
We will arrange for a stress test Eugenie Birks) - we will call you with those results. If this looks good, would suggest diet/exercise/weight loss.   For now stay on your current list of medicines with the following:    I would suggest using the Mobic just sparingly   Take the Coreg twice a day   Try some OTC Prilosec daily for the next 2 weeks to see if this helps.   Call the [facility] office at [phone] if you have any questions, problems or concerns.

## 2012-08-11 NOTE — Progress Notes (Signed)
Caitlyn [name] Date of Birth: [birth year] Medical Record #[id number]  History of Present Illness: Caitlyn [name] is seen today for a follow up visit. Seen for Caitlyn [name]. Last seen here in May of 2013. Has CAD, HLD, hypothyroidism, and HTN. Previously followed by Caitlyn [name]. Had an MI in 2011 with DES x 2 to the RCA. Repeat cath in December of 2012 showed stable CAD. Does have PVD with carotid disease - 60 -78% on the right and 0 -39% on the left per study in January of 2013.   Comes in today. She is here alone. Has multiple issues. Primarily concerned about not sleeping and having to use sleeping pills. Has tried Pamelor with no help and Trazadone. Now on Ambien. Feels tired all the time. Continues to have chest pain and pain in her legs. Nothing that is exertional. Not exercising. Not really able to describe her pain - says "it just hurts". No associated symptoms. Can last for a day or so. Not sure what makes it go away. No NTG use. No belching or burping reported. Also thinks she is on too much medicine and wants off some of it. Only taking her Coreg once a day. Now on Mobic for joint pain. Uses Flexeril for muscle spasms. Has not been taking her cholesterol medicine regularly.    Current Outpatient Prescriptions on File Prior to Visit  Medication Sig Dispense Refill  . amLODipine (NORVASC) 10 MG tablet Take 10 mg by mouth daily before breakfast.      . aspirin 325 MG tablet Take 81 mg by mouth daily.       Marland Kitchen atorvastatin (LIPITOR) 10 MG tablet Take 1 tablet (10 mg total) by mouth daily.  90 tablet  3  . carvedilol (COREG) 3.125 MG tablet TAKE ONE TABLET BY MOUTH TWICE DAILY  60 tablet  5  . clopidogrel (PLAVIX) 75 MG tablet Take 1 tablet (75 mg total) by mouth daily.  90 tablet  6  . cyclobenzaprine (FLEXERIL) 5 MG tablet Take 1 tablet (5 mg total) by mouth 3 (three) times daily as needed for muscle spasms.  30 tablet  1  . levothyroxine (SYNTHROID, LEVOTHROID) 112 MCG tablet Take 112 mcg by  mouth daily before breakfast.      . lisinopril-hydrochlorothiazide (PRINZIDE,ZESTORETIC) 20-12.5 MG per tablet Take 2 tablets by mouth daily before breakfast.      . meloxicam (MOBIC) 7.5 MG tablet Take 1 tablet (7.5 mg total) by mouth daily.  30 tablet  3  . zolpidem (AMBIEN) 10 MG tablet Take 1 tablet (10 mg total) by mouth at bedtime as needed for sleep.  30 tablet  3  . nitroGLYCERIN (NITROSTAT) 0.4 MG SL tablet Place 0.4 mg under the tongue every 5 (five) minutes as needed.        . [DISCONTINUED] metoprolol tartrate (LOPRESSOR) 25 MG tablet Take 1 tablet (25 mg total) by mouth 2 (two) times daily.  60 tablet  6   No current facility-administered medications on file prior to visit.    Allergies  Allergen Reactions  . Pravastatin Other (See Comments)    dizzy    Past Medical History  Diagnosis Date  . HYPOTHYROIDISM     post ablation tx Graves  . HYPERLIPIDEMIA   . HYPERTENSION   . CAD 06/2009    s/p MI 4/11: tx with DES x 2 to RCA;  LHC 03/17/11: LAD 30-40%, distal LAD 30-40%, OM2 40-50%, RCA stent patent, EF greater than 70%.  . CAROTID  STENOSIS     carotid Dopplers 03/25/11: RICA 60-79%; LICA 0-39%.  Follow up recommended in 6 months.  . PVD   . VITAMIN D DEFICIENCY     DEXA 04/2009 normal  . TOBACCO ABUSE quit 06/2009    Past Surgical History  Procedure Laterality Date  . Carotid stent      bilateral carotid artery disease post stent of left carotid  . Radioactive plaque insertion      Graves disease post radioactive treatment complicated hypothyroidism    History  Smoking status  . Former Smoker  . Quit date: 06/30/2009  Smokeless tobacco  . 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  Divorced, single, lives alone- 2 grown dtr nearby, 3 g-kds    History  Alcohol Use No    Family History  Problem Relation Age of Onset  . Coronary artery disease Other   . Hypertension Other     Review of Systems: The  review of systems is per the HPI.  All other systems were reviewed and are negative.  Physical Exam: BP 134/80  Pulse 60  Ht 5\' 6"  (1.676 m)  Wt 252 lb (114.306 kg)  BMI 40.69 kg/m2 Patient is very pleasant and in no acute distress. Skin is warm and dry. Color is normal.  HEENT is unremarkable. Normocephalic/atraumatic. PERRL. Sclera are nonicteric. Neck is supple. No masses. No JVD. Lungs are clear. Cardiac exam shows a regular rate and rhythm. Abdomen is soft. Extremities are without edema. Gait and ROM are intact. No gross neurologic deficits noted.  LABORATORY DATA: EKG shows sinus rhythm with nonspecific T wave changes.   Lab Results  Component Value Date   WBC 7.6 08/02/2012   HGB 13.5 08/02/2012   HCT 39.5 08/02/2012   PLT 215.0 08/02/2012   GLUCOSE 68* 08/02/2012   CHOL 236* 08/02/2012   TRIG 230.0* 08/02/2012   HDL 43.60 08/02/2012   LDLDIRECT 156.4 08/02/2012   LDLCALC 159* 03/16/2011   ALT 16 02/06/2012   AST 19 02/06/2012   NA 139 08/02/2012   K 4.0 08/02/2012   CL 105 08/02/2012   CREATININE 1.2 08/02/2012   BUN 20 08/02/2012   CO2 27 08/02/2012   TSH 1.91 08/02/2012   INR 1.03 03/16/2011    Assessment / Plan: 1. CAD - with multitude of symptoms - will arrange for Lexiscan. If ok, would favor CV risk factor modification. She is wanting to stop her Plavix. Her stents were in April of 2011. Will see how her stress test turns out first.   2. HTN - BP by me is ok today. I do not think we should cut anything back at this time. Maybe with diet/exercise/weight loss she would be able to stop some medicines in the future. I have asked her to take the Coreg twice a day.   3. HLD - has had recent labs - noted - she admits that she is not taking her statin regularly.   Patient is agreeable to this plan and will call if any problems develop in the interim.   Caitlyn Macadamia, RN, ANP-C [facility] [address] [place], [place]  [address]

## 2012-08-17 ENCOUNTER — Ambulatory Visit (HOSPITAL_COMMUNITY): Payer: No Typology Code available for payment source | Attending: Cardiology | Admitting: Radiology

## 2012-08-17 VITALS — Ht 66.0 in | Wt 250.0 lb

## 2012-08-17 DIAGNOSIS — I739 Peripheral vascular disease, unspecified: Secondary | ICD-10-CM | POA: Insufficient documentation

## 2012-08-17 DIAGNOSIS — R5381 Other malaise: Secondary | ICD-10-CM | POA: Insufficient documentation

## 2012-08-17 DIAGNOSIS — R079 Chest pain, unspecified: Secondary | ICD-10-CM | POA: Insufficient documentation

## 2012-08-17 DIAGNOSIS — I2510 Atherosclerotic heart disease of native coronary artery without angina pectoris: Secondary | ICD-10-CM | POA: Insufficient documentation

## 2012-08-17 DIAGNOSIS — E785 Hyperlipidemia, unspecified: Secondary | ICD-10-CM | POA: Insufficient documentation

## 2012-08-17 DIAGNOSIS — I252 Old myocardial infarction: Secondary | ICD-10-CM | POA: Insufficient documentation

## 2012-08-17 DIAGNOSIS — Z87891 Personal history of nicotine dependence: Secondary | ICD-10-CM | POA: Insufficient documentation

## 2012-08-17 DIAGNOSIS — I779 Disorder of arteries and arterioles, unspecified: Secondary | ICD-10-CM | POA: Insufficient documentation

## 2012-08-17 DIAGNOSIS — I10 Essential (primary) hypertension: Secondary | ICD-10-CM | POA: Insufficient documentation

## 2012-08-17 DIAGNOSIS — Z8249 Family history of ischemic heart disease and other diseases of the circulatory system: Secondary | ICD-10-CM | POA: Insufficient documentation

## 2012-08-17 MED ORDER — TECHNETIUM TC 99M SESTAMIBI GENERIC - CARDIOLITE
33.0000 | Freq: Once | INTRAVENOUS | Status: AC | PRN
  Administered 2012-08-17: 33 via INTRAVENOUS

## 2012-08-17 MED ORDER — REGADENOSON 0.4 MG/5ML IV SOLN
0.4000 mg | Freq: Once | INTRAVENOUS | Status: AC
  Administered 2012-08-17: 0.4 mg via INTRAVENOUS

## 2012-08-17 NOTE — Progress Notes (Signed)
[facility] SITE 3 NUCLEAR MED [address] [place], [place] [address] [phone]    Cardiology Nuclear Med Study  Caitlyn [name] is a 62 y.o. female     MRN : [id number]     DOB: 1951/01/06  Procedure Date: 08/17/2012  Nuclear Med Background Indication for Stress Test:  Evaluation for Ischemia and Stent Patency History:  '11 Echo: EF=55-60%, 06-2009 MI> Stents x 2 RCA, 10-2009 Myocardial Perfusion Study-No ischemia, EF=60%, and '12  Heart Catheterization-Patent Stents, residual nonobstructive CAD, EF=70% Cardiac Risk Factors: Carotid Disease, Strong Family History - CAD, History of Smoking, Hypertension, Lipids and PVD  Symptoms: Chest Pain with/without exertion (yesterday),  Fatigue and Fatigue with Exertion   Nuclear Pre-Procedure Caffeine/Decaff Intake:  None > 12 hrs NPO After: 7:30am   Lungs:  clear O2 Sat: 98% on room air. IV 0.9% NS with Angio Cath:  20g  IV Site: R Antecubital x 1, tolerated well IV Started by:  Irean Hong, RN  Chest Size (in):  42 Cup Size: D  Height: 5\' 6"  (1.676 m)  Weight:  250 lb (113.399 kg)  BMI:  Body mass index is 40.37 kg/(m^2). Tech Comments:  Coreg, Norvasc, and Prinzide this am    Nuclear Med Study 1 or 2 day study: 2 day  Stress Test Type:  Lexiscan  Reading MD: Willa Rough, MD  Order Authorizing Provider:  Verne Carrow, MD  Resting Radionuclide: Technetium 45m Sestamibi  Resting Radionuclide Dose: 33.0 mCi on 08/18/12   Stress Radionuclide:  Technetium 56m Sestamibi  Stress Radionuclide Dose: 33.0 mCi on 08/17/12           Stress Protocol Rest HR: 61 Stress HR: 83  Rest BP: 99/57 Stress BP: 112/65  Exercise Time (min): n/a METS: n/a   Predicted Max HR: 161 bpm % Max HR: 51.55 bpm Rate Pressure Product: 9296   Dose of Adenosine (mg):  n/a Dose of Lexiscan: 0.4 mg  Dose of Atropine (mg): n/a Dose of Dobutamine: n/a mcg/kg/min (at max HR)  Stress Test Technologist: Irean Hong, RN  Nuclear Technologist:   Domenic Polite, CNMT     Rest Procedure:  Myocardial perfusion imaging was performed at rest 45 minutes following the intravenous administration of Technetium 60m Sestamibi. Rest ECG: NSR with non-specific ST-T wave changes  Stress Procedure:  The patient received IV Lexiscan 0.4 mg over 15-seconds.  Technetium 87m Sestamibi injected at 30-seconds.  Quantitative spect images were obtained after a 45 minute delay. Stress ECG: No significant change from baseline ECG  QPS Raw Data Images:  Normal; no motion artifact; normal heart/lung ratio. Stress Images:  Normal homogeneous uptake in all areas of the myocardium. Rest Images:  Normal homogeneous uptake in all areas of the myocardium. Subtraction (SDS):  No evidence of ischemia. Transient Ischemic Dilatation (Normal <1.22):  1.02 Lung/Heart Ratio (Normal <0.45):  0.24  Quantitative Gated Spect Images QGS EDV:  83 ml QGS ESV:  20 ml  Impression Exercise Capacity:  Lexiscan with no exercise. BP Response:  Normal blood pressure response. Clinical Symptoms:  No chest pain. ECG Impression:  No significant ST segment change suggestive of ischemia. Comparison with Prior Nuclear Study: No significant change from previous study  Overall Impression:  Normal stress nuclear study.  LV Ejection Fraction: 76%.  LV Wall Motion:  NL LV Function; NL Wall Motion  [employer]

## 2012-08-18 ENCOUNTER — Ambulatory Visit (HOSPITAL_COMMUNITY): Payer: No Typology Code available for payment source | Attending: Cardiology

## 2012-08-18 DIAGNOSIS — R0989 Other specified symptoms and signs involving the circulatory and respiratory systems: Secondary | ICD-10-CM

## 2012-08-18 MED ORDER — TECHNETIUM TC 99M SESTAMIBI GENERIC - CARDIOLITE
33.0000 | Freq: Once | INTRAVENOUS | Status: AC | PRN
  Administered 2012-08-18: 33 via INTRAVENOUS

## 2012-09-07 ENCOUNTER — Other Ambulatory Visit: Payer: Self-pay | Admitting: *Deleted

## 2012-09-08 MED ORDER — ZOLPIDEM TARTRATE 10 MG PO TABS: 10.0000 mg | ORAL_TABLET | Freq: Every evening | ORAL | Status: DC | PRN

## 2012-09-08 NOTE — Telephone Encounter (Signed)
Faxed script back to [facility].../lmb 

## 2012-09-15 ENCOUNTER — Ambulatory Visit: Payer: No Typology Code available for payment source | Admitting: Internal Medicine

## 2012-09-15 ENCOUNTER — Encounter: Payer: Self-pay | Admitting: Internal Medicine

## 2012-09-15 ENCOUNTER — Ambulatory Visit (INDEPENDENT_AMBULATORY_CARE_PROVIDER_SITE_OTHER): Payer: No Typology Code available for payment source | Admitting: Internal Medicine

## 2012-09-15 VITALS — BP 122/72 | HR 70 | Temp 98.5°F | Wt 256.8 lb

## 2012-09-15 DIAGNOSIS — I10 Essential (primary) hypertension: Secondary | ICD-10-CM

## 2012-09-15 DIAGNOSIS — Z8639 Personal history of other endocrine, nutritional and metabolic disease: Secondary | ICD-10-CM

## 2012-09-15 DIAGNOSIS — Z862 Personal history of diseases of the blood and blood-forming organs and certain disorders involving the immune mechanism: Secondary | ICD-10-CM

## 2012-09-15 DIAGNOSIS — E039 Hypothyroidism, unspecified: Secondary | ICD-10-CM

## 2012-09-15 DIAGNOSIS — R609 Edema, unspecified: Secondary | ICD-10-CM

## 2012-09-15 MED ORDER — AMLODIPINE BESYLATE 5 MG PO TABS: 5.0000 mg | ORAL_TABLET | Freq: Every day | ORAL | Status: DC

## 2012-09-15 MED ORDER — FUROSEMIDE 20 MG PO TABS: 20.0000 mg | ORAL_TABLET | Freq: Every day | ORAL | Status: DC

## 2012-09-15 NOTE — Progress Notes (Signed)
  Subjective:    Patient ID: Caitlyn [name], female    DOB: November 11, 1950, 62 y.o.   MRN: [id number]  HPI  See CC  Past Medical History  Diagnosis Date  . HYPOTHYROIDISM     post ablation tx Graves  . HYPERLIPIDEMIA   . HYPERTENSION   . CAD 06/2009    s/p MI 4/11: tx with DES x 2 to RCA;  LHC 03/17/11: LAD 30-40%, distal LAD 30-40%, OM2 40-50%, RCA stent patent, EF greater than 70%.  . CAROTID STENOSIS     carotid Dopplers 03/25/11: RICA 60-79%; LICA 0-39%.  Follow up recommended in 6 months.  . PVD   . VITAMIN D DEFICIENCY     DEXA 04/2009 normal  . TOBACCO ABUSE quit 06/2009    Review of Systems  Constitutional: Positive for fatigue. Negative for fever.  HENT: Negative for mouth sores and ear discharge.   Respiratory: Negative for cough and shortness of breath.   Cardiovascular: Positive for leg swelling. Negative for chest pain.  Neurological: Negative for dizziness, facial asymmetry and headaches.  Psychiatric/Behavioral: Positive for sleep disturbance, dysphoric mood and decreased concentration. Negative for suicidal ideas and self-injury. The patient is not nervous/anxious.         Objective:   Physical Exam  BP 122/72  Pulse 70  Temp(Src) 98.5 F (36.9 C) (Oral)  Wt 256 lb 12.8 oz (116.484 kg)  BMI 41.47 kg/m2  SpO2 96% Wt Readings from Last 3 Encounters:  09/15/12 256 lb 12.8 oz (116.484 kg)  08/17/12 250 lb (113.399 kg)  08/11/12 252 lb (114.306 kg)   Constitutional: She is obese, but appears well-developed and well-nourished. No distress.  Neck: Normal range of motion. Neck supple. No JVD present. No thyromegaly present.  Cardiovascular: Normal rate, regular rhythm and normal heart sounds.  No murmur heard. mild L>R BLE dep edema. Pulmonary/Chest: Effort normal and breath sounds normal. No respiratory distress. She has no wheezes.  Psychiatric: She has a dysphoric mood and affect. Her behavior is normal. Judgment and thought content normal.   Lab Results   Component Value Date   WBC 7.6 08/02/2012   HGB 13.5 08/02/2012   HCT 39.5 08/02/2012   PLT 215.0 08/02/2012   GLUCOSE 68* 08/02/2012   CHOL 236* 08/02/2012   TRIG 230.0* 08/02/2012   HDL 43.60 08/02/2012   LDLDIRECT 156.4 08/02/2012   LDLCALC 159* 03/16/2011   ALT 16 02/06/2012   AST 19 02/06/2012   NA 139 08/02/2012   K 4.0 08/02/2012   CL 105 08/02/2012   CREATININE 1.2 08/02/2012   BUN 20 08/02/2012   CO2 27 08/02/2012   TSH 1.91 08/02/2012   INR 1.03 03/16/2011   Nuc Stress Test 08/17/12 Overall Impression:  Normal stress nuclear study. LV Ejection Fraction: 76%.  LV Wall Motion:  NL LV Function; NL Wall Motion      Assessment & Plan:  See problem list. Medications and labs reviewed today.  Edema, dependent. Suspect related to weight gain, obesity, amlodipine side effects and summer heat. Medication changes and antihypertensives recommended and prescribed today. Also add furosemide 20 mg every morning as needed for edema -reviewed recent stress echocardiogram with normal LV EF on June 2014. No systemic evidence of volume overload on exam

## 2012-09-15 NOTE — Assessment & Plan Note (Signed)
BP Readings from Last 3 Encounters:  09/15/12 122/72  08/11/12 134/80  08/02/12 102/64   Mild edema -  Suspect multifactorial to weight gain/obesity, medication side effect Reduce dose amlodipine from 10 to 5 mg daily Reminded to comply with Coreg twice daily as prescribed, refill provided On Max dose lisinopril and HCTZ, add Lasix to take each morning as needed for edema

## 2012-09-15 NOTE — Patient Instructions (Signed)
It was good to see you today. We have reviewed your prior records including labs and tests today Medications reviewed and updated: reduce amlodipine dose to 5 mg daily, continue carvedilol 3.125 mg twice daily and add Lasix each morning as needed for swelling/fluid Your prescription(s) have been submitted to your pharmacy. Please take as directed and contact our office if you believe you are having problem(s) with the medication(s). no Other medication changes recommended at this time. Will refer to endocrinology specialist for evaluation of your thyroid condition and symptoms -our office will call regarding this appointment once made

## 2012-09-15 NOTE — Assessment & Plan Note (Signed)
Hx Graves with RAI (remote) Patient feels very strongly that her weight gain, fatigue, myalgias are similar to Graves' symptomatology and requests evaluation by endocrine for same -will refer as requested Reviewed recent normal TFTs Advised to continue same medication until specialist evaluation for further review  Lab Results  Component Value Date   TSH 1.91 08/02/2012

## 2012-09-27 ENCOUNTER — Ambulatory Visit (INDEPENDENT_AMBULATORY_CARE_PROVIDER_SITE_OTHER): Payer: No Typology Code available for payment source | Admitting: Endocrinology

## 2012-09-27 ENCOUNTER — Encounter: Payer: Self-pay | Admitting: Endocrinology

## 2012-09-27 ENCOUNTER — Other Ambulatory Visit: Payer: Self-pay | Admitting: *Deleted

## 2012-09-27 VITALS — BP 122/74 | HR 65 | Ht 66.0 in | Wt 254.6 lb

## 2012-09-27 DIAGNOSIS — F329 Major depressive disorder, single episode, unspecified: Secondary | ICD-10-CM

## 2012-09-27 DIAGNOSIS — F32A Depression, unspecified: Secondary | ICD-10-CM

## 2012-09-27 DIAGNOSIS — Z862 Personal history of diseases of the blood and blood-forming organs and certain disorders involving the immune mechanism: Secondary | ICD-10-CM

## 2012-09-27 DIAGNOSIS — E039 Hypothyroidism, unspecified: Secondary | ICD-10-CM

## 2012-09-27 DIAGNOSIS — Z8639 Personal history of other endocrine, nutritional and metabolic disease: Secondary | ICD-10-CM

## 2012-09-27 MED ORDER — THYROID 90 MG PO TABS: 90.0000 mg | ORAL_TABLET | Freq: Every day | ORAL | Status: DC

## 2012-09-27 NOTE — Progress Notes (Signed)
Patient ID: Caitlyn [name], female   DOB: 06-17-1950, 62 y.o.   MRN: [id number]  Reason for Appointment:  Hypothyroidism, new visit    History of Present Illness:   The Hyothyroidism was first diagnosed in 20-30 yrs ago after treatment of Graves' disease with radioactive iodine  The symptoms consistent with hypothyroidism are: fatigue, not having any energy which occurs Daily for the last year No cold sensitivity, difficulty concentrating, her dry skin is somewhat chronic and not worse. She also has difficulty with weight loss for years, and is asking about her thyroid playing a role She is also asking about the role of thyroid in her fatigue No history of hair loss or constipation.            The last TSH was performed  about 2 months ago and was 1.9   The treatments that the patient has taken include levothyroxine 112 mcg for the last  3 years or so, previously was on brand name Synthroid. She has been quite regular with medication        FATIGUE: She does have insomnia and some symptoms of depression. She has both early and late insomnia. Was tried on Prozac but does not think it helped.  WEIGHT gain: She thinks her weight has increased over the last 6 months. Not walking for exercise, she does think that she is watching her diet    No visits with results within 1 Week(s) from this visit. Latest known visit with results is:  Clinical Support on 08/02/2012  Component Date Value Range Status  . TSH 08/02/2012 1.91  0.35 - 5.50 uIU/mL Final  . Cholesterol 08/02/2012 236* 0 - 200 mg/dL Final   ATP III Classification       Desirable:  < 200 mg/dL               Borderline High:  200 - 239 mg/dL          High:  > = 409 mg/dL  . Triglycerides 08/02/2012 230.0* 0.0 - 149.0 mg/dL Final   Normal:  <811 mg/dLBorderline High:  150 - 199 mg/dL  . HDL 08/02/2012 43.60  >39.00 mg/dL Final  . VLDL 91/47/8295 46.0* 0.0 - 40.0 mg/dL Final  . Total CHOL/HDL Ratio 08/02/2012 5   Final        Men          Women1/2 Average Risk     3.4          3.3Average Risk          5.0          4.42X Average Risk          9.6          7.13X Average Risk          15.0          11.0                      . WBC 08/02/2012 7.6  4.5 - 10.5 K/uL Final  . RBC 08/02/2012 4.34  3.87 - 5.11 Mil/uL Final  . Hemoglobin 08/02/2012 13.5  12.0 - 15.0 g/dL Final  . HCT 62/13/0865 39.5  36.0 - 46.0 % Final  . MCV 08/02/2012 90.9  78.0 - 100.0 fl Final  . MCHC 08/02/2012 34.2  30.0 - 36.0 g/dL Final  . RDW 78/46/9629 14.2  11.5 - 14.6 % Final  . Platelets 08/02/2012 215.0  150.0 - 400.0 K/uL  Final  . Neutrophils Relative % 08/02/2012 55.7  43.0 - 77.0 % Final  . Lymphocytes Relative 08/02/2012 34.7  12.0 - 46.0 % Final  . Monocytes Relative 08/02/2012 5.6  3.0 - 12.0 % Final  . Eosinophils Relative 08/02/2012 3.3  0.0 - 5.0 % Final  . Basophils Relative 08/02/2012 0.7  0.0 - 3.0 % Final  . Neutro Abs 08/02/2012 4.2  1.4 - 7.7 K/uL Final  . Lymphs Abs 08/02/2012 2.6  0.7 - 4.0 K/uL Final  . Monocytes Absolute 08/02/2012 0.4  0.1 - 1.0 K/uL Final  . Eosinophils Absolute 08/02/2012 0.3  0.0 - 0.7 K/uL Final  . Basophils Absolute 08/02/2012 0.1  0.0 - 0.1 K/uL Final  . Sodium 08/02/2012 139  135 - 145 mEq/L Final  . Potassium 08/02/2012 4.0  3.5 - 5.1 mEq/L Final  . Chloride 08/02/2012 105  96 - 112 mEq/L Final  . CO2 08/02/2012 27  19 - 32 mEq/L Final  . Glucose, Bld 08/02/2012 68* 70 - 99 mg/dL Final  . BUN 16/12/9602 20  6 - 23 mg/dL Final  . Creatinine, Ser 08/02/2012 1.2  0.4 - 1.2 mg/dL Final  . Calcium 54/11/8117 8.7  8.4 - 10.5 mg/dL Final  . GFR 14/78/2956 56.84* >60.00 mL/min Final  . Direct LDL 08/02/2012 156.4   Final   Optimal:  <100 mg/dLNear or Above Optimal:  100-129 mg/dLBorderline High:  130-159 mg/dLHigh:  160-189 mg/dLVery High:  >190 mg/dL    Past Medical History  Diagnosis Date  . HYPOTHYROIDISM     post ablation tx Graves  . HYPERLIPIDEMIA   . HYPERTENSION   . CAD 06/2009     s/p MI 4/11: tx with DES x 2 to RCA;  LHC 03/17/11: LAD 30-40%, distal LAD 30-40%, OM2 40-50%, RCA stent patent, EF greater than 70%.  . CAROTID STENOSIS     carotid Dopplers 03/25/11: RICA 60-79%; LICA 0-39%.  Follow up recommended in 6 months.  . PVD   . VITAMIN D DEFICIENCY     DEXA 04/2009 normal  . TOBACCO ABUSE quit 06/2009    Past Surgical History  Procedure Laterality Date  . Carotid stent      bilateral carotid artery disease post stent of left carotid  . Radioactive plaque insertion      Graves disease post radioactive treatment complicated hypothyroidism    Family History  Problem Relation Age of Onset  . Coronary artery disease Other   . Hypertension Other     Social History:  reports that she quit smoking about 3 years ago. She does not have any smokeless tobacco history on file. She reports that she does not drink alcohol. Her drug history is not on file.  Allergies:  Allergies  Allergen Reactions  . Pravastatin Other (See Comments)    dizzy     Medication List       This list is accurate as of: 09/27/12 11:59 PM.  Always use your most recent med list.               amLODipine 5 MG tablet  Commonly known as:  NORVASC  Take 1 tablet (5 mg total) by mouth daily before breakfast.     aspirin 325 MG tablet  Take 81 mg by mouth daily.     atorvastatin 10 MG tablet  Commonly known as:  LIPITOR  Take 1 tablet (10 mg total) by mouth daily.     carvedilol 3.125 MG tablet  Commonly known as:  COREG  TAKE ONE TABLET BY MOUTH TWICE DAILY     clopidogrel 75 MG tablet  Commonly known as:  PLAVIX  Take 1 tablet (75 mg total) by mouth daily.     COLCRYS 0.6 MG tablet  Generic drug:  colchicine  0.6 mg daily as needed.     cyclobenzaprine 5 MG tablet  Commonly known as:  FLEXERIL  Take 1 tablet (5 mg total) by mouth 3 (three) times daily as needed for muscle spasms.     fluticasone 27.5 MCG/SPRAY nasal spray  Commonly known as:  VERAMYST  Place 2 sprays  into the nose daily as needed for rhinitis.     furosemide 20 MG tablet  Commonly known as:  LASIX  Take 1 tablet (20 mg total) by mouth daily. As needed for fluid/swelling     levothyroxine 112 MCG tablet  Commonly known as:  SYNTHROID, LEVOTHROID  Take 112 mcg by mouth daily before breakfast.     lisinopril-hydrochlorothiazide 20-12.5 MG per tablet  Commonly known as:  PRINZIDE,ZESTORETIC  Take 2 tablets by mouth daily before breakfast.     meloxicam 7.5 MG tablet  Commonly known as:  MOBIC  Take 1 tablet (7.5 mg total) by mouth daily.     nitroGLYCERIN 0.4 MG SL tablet  Commonly known as:  NITROSTAT  Place 0.4 mg under the tongue every 5 (five) minutes as needed.     thyroid 90 MG tablet  Commonly known as:  ARMOUR THYROID  Take 1 tablet (90 mg total) by mouth daily.     zolpidem 10 MG tablet  Commonly known as:  AMBIEN  Take 1 tablet (10 mg total) by mouth at bedtime as needed for sleep.        Review of Systems:  CARDIOLOGY:  She has  history of high blood pressure.  also has history of coronary artery disease and cerebrovascular disease        GASTROENTEROLOGY:   Change in bowel habits at times with constipation.      ENDOCRINOLOGY:  no history of Diabetes.     HYPERCHOLESTEROLEMIA: She is supposed to be on lipitor but is not taking this, she thinks it makes her  spaced out She is complaining of edema of her left leg on and off for the last few months, somewhat better with taking a diuretic Has history of joint pain  History of insomnia, does not find relief with taking Ambien She has had prominence of her eyes since her Graves' disease but it is less than previously and has no local symptoms   Examination:  BP 122/74  Pulse 65  Ht 5\' 6"  (1.676 m)  Wt 254 lb 9.6 oz (115.486 kg)  BMI 41.11 kg/m2  SpO2 96%   General Appearance: pleasant,  she had generalized obesity present         Eyes:  she has moderate proptosis with only mild eyelid swelling.  Has no lid  lag or stare         Neck: The thyroid is nonpalpable . There is no lymphadenopathy .    Cardiovascular: Normal apex and heart sounds, no murmur Respiratory:  Lungs clear Gastrointestinal: abdomen soft, no hepatosplenomegaly      Neurological: REFLEXES: at biceps and ankles appear normal are normal.     Skin: moist, warm, no rashes        Assessments 1. Hypothyroidism, post ablative  She has been adequately treated with levothyroxine 112 mcg and her last TSH was normal  at 1.9 Most likely her fatigue is related to insomnia and depression Also reassured her that her left foot swelling is unrelated to Graves' disease and is likely related to venous insufficiency . She needs to treat this with compression hose, discussed. Also her weight gain is unrelated to her hypothyroidism and explained this to her  However since she is not interested in trying to take an antidepressant may empirically try her on Armour Thyroid instead of levothyroxine. This may help subjectively in some patients and she is agreeable to trying this.  She has residual Graves ophthalmopathy  Treatment:   Change levothyroxine to Armour Thyroid 112 mcg and followup in 6 weeks with TSH    Dhanya Bogle 09/28/2012, 3:10 PM

## 2012-09-27 NOTE — Patient Instructions (Addendum)
You will be switched to Armour Thyroid to take once in the morning before breakfast and followup in 6 weeks. If not feeling any better in 3-4 weeks make appointment to see your primary care physician

## 2012-10-06 ENCOUNTER — Ambulatory Visit: Payer: No Typology Code available for payment source | Admitting: Endocrinology

## 2012-10-20 ENCOUNTER — Ambulatory Visit (INDEPENDENT_AMBULATORY_CARE_PROVIDER_SITE_OTHER): Payer: No Typology Code available for payment source | Admitting: Internal Medicine

## 2012-10-20 ENCOUNTER — Encounter: Payer: Self-pay | Admitting: Internal Medicine

## 2012-10-20 VITALS — BP 112/70 | HR 69 | Temp 97.9°F | Wt 254.6 lb

## 2012-10-20 DIAGNOSIS — F329 Major depressive disorder, single episode, unspecified: Secondary | ICD-10-CM

## 2012-10-20 DIAGNOSIS — E785 Hyperlipidemia, unspecified: Secondary | ICD-10-CM

## 2012-10-20 DIAGNOSIS — F32A Depression, unspecified: Secondary | ICD-10-CM

## 2012-10-20 DIAGNOSIS — M79609 Pain in unspecified limb: Secondary | ICD-10-CM

## 2012-10-20 MED ORDER — TRAZODONE HCL 100 MG PO TABS: 100.0000 mg | ORAL_TABLET | Freq: Every day | ORAL | Status: DC

## 2012-10-20 MED ORDER — PRAVASTATIN SODIUM 40 MG PO TABS: 40.0000 mg | ORAL_TABLET | Freq: Every day | ORAL | Status: DC

## 2012-10-20 MED ORDER — ACETAMINOPHEN 500 MG PO TABS: 1000.0000 mg | ORAL_TABLET | Freq: Two times a day (BID) | ORAL | Status: DC

## 2012-10-20 NOTE — Assessment & Plan Note (Signed)
Chronic symptoms - associated with poor enery, irritability and insomnia Encouraged compliance with traz and prozac - new rx provided Offered counseling refer, pt declines - states family and friends available support affirms no SI/HI

## 2012-10-20 NOTE — Progress Notes (Signed)
Subjective:    Patient ID: Caitlyn [name], female    DOB: 05-03-1950, 62 y.o.   MRN: [id number]  Back Pain Pertinent negatives include no fever or headaches.   Also reviewed chronic medical issues:  CAD - MI 06/2009 with 2 DES to RCA - no anginal symptoms - follows infreq with cards for same - reports noncompliance with ongoing medical treatment but denies adverse side effects related to rx'd therapy. chronic vague SSCP: nonexertional, not positional with repeat card eval: cath 03/17/11 - no cardiac problems found    dyslipidemia - intol of statin trials (atorva, crestor, prava) due to myalgia (improved off med) - tried on welchol but poor tol due to constipation -  hypothyroid - hx graves - reports compliance with ongoing medical treatment and no changes in medication dose or frequency. denies adverse side effects related to current therapy. no skin or bowel changes  HTN - reports variable compliance with ongoing medical treatment and no changes in medication dose or frequency. denies adverse side effects related to current therapy.    complains of fatigue - Overlap with ongoing chronic depression symptoms >6 mo: poor sleep, easy tearfulness, no energy - no SI/HI  Past Medical History  Diagnosis Date  . HYPOTHYROIDISM     post ablation tx Graves  . HYPERLIPIDEMIA   . HYPERTENSION   . CAD 06/2009    s/p MI 4/11: tx with DES x 2 to RCA;  LHC 03/17/11: LAD 30-40%, distal LAD 30-40%, OM2 40-50%, RCA stent patent, EF greater than 70%.  . CAROTID STENOSIS     carotid Dopplers 03/25/11: RICA 60-79%; LICA 0-39%.  Follow up recommended in 6 months.  . PVD   . VITAMIN D DEFICIENCY     DEXA 04/2009 normal  . TOBACCO ABUSE quit 06/2009    Review of Systems  Constitutional: Positive for fatigue. Negative for fever.  HENT: Negative for mouth sores and ear discharge.   Respiratory: Negative for cough and shortness of breath.   Musculoskeletal: Positive for back pain.  Neurological: Negative  for dizziness, facial asymmetry and headaches.  Psychiatric/Behavioral: Positive for sleep disturbance, dysphoric mood and decreased concentration. Negative for suicidal ideas and self-injury. The patient is not nervous/anxious.         Objective:   Physical Exam  BP 112/70  Pulse 69  Temp(Src) 97.9 F (36.6 C) (Oral)  Wt 254 lb 9.6 oz (115.486 kg)  BMI 41.11 kg/m2  SpO2 97% Wt Readings from Last 3 Encounters:  10/20/12 254 lb 9.6 oz (115.486 kg)  09/27/12 254 lb 9.6 oz (115.486 kg)  09/15/12 256 lb 12.8 oz (116.484 kg)   Constitutional: She is obese, but appears well-developed and well-nourished. No distress.  Neck: Normal range of motion. Neck supple. No JVD present. No thyromegaly present.  Cardiovascular: Normal rate, regular rhythm and normal heart sounds.  No murmur heard. No BLE edema. Pulmonary/Chest: Effort normal and breath sounds normal. No respiratory distress. She has no wheezes.  MSkel: B knee - boggy synovitis - tender to palpation over joint line; FROM and ligamentous function intact Psychiatric: She has a dysphoric mood and affect. Her behavior is normal. Judgment and thought content normal.   Lab Results  Component Value Date   WBC 7.6 08/02/2012   HGB 13.5 08/02/2012   HCT 39.5 08/02/2012   PLT 215.0 08/02/2012   GLUCOSE 68* 08/02/2012   CHOL 236* 08/02/2012   TRIG 230.0* 08/02/2012   HDL 43.60 08/02/2012   LDLDIRECT 156.4 08/02/2012  LDLCALC 159* 03/16/2011   ALT 16 02/06/2012   AST 19 02/06/2012   NA 139 08/02/2012   K 4.0 08/02/2012   CL 105 08/02/2012   CREATININE 1.2 08/02/2012   BUN 20 08/02/2012   CO2 27 08/02/2012   TSH 1.91 08/02/2012   INR 1.03 03/16/2011       Assessment & Plan:  See problem list. Medications and labs reviewed today.  Fatigue, chronic - nonspecific symptoms/exam - Suspect element of underlying depression associated with chronic insomnia -   Myalgias, bilateral knee osteoarthritis - unimproved with tramadol, better with  meloxicam but patient not interested in "addictive" medications to control same - educated on importance of exercise and weight loss to control arthritis symptoms. continue low-dose anti-inflammatory - meloxicam 7.5 mg daily prn and start tylenol ES 1000mg  bid - I declined to provide handicap tag for patient in regards to this diagnosis toda

## 2012-10-20 NOTE — Patient Instructions (Signed)
It was good to see you today. We have reviewed your prior records including labs and tests today Medications reviewed and updated - Start pravastatin once daily for cholesterol to keep heart stents open Start extra strength tylenol 2pills twice daily for arthritis and leg pain Start trazodone at bedtime for sleep intake Prozac once daily for depression Your prescription(s) have been submitted to your pharmacy. Please take as directed and contact our office if you believe you are having problem(s) with the medication(s). Followup with cardiology specialist as discussed Written prescription provided for pair of compression hose to wear each morning, remove at bedtime Please schedule followup in 3-4 months, call sooner if problems.

## 2012-10-20 NOTE — Assessment & Plan Note (Signed)
Chronic, diffuse and bilateral PAD evaluation by cardiology reviewed - Advised wearing compression hose and use of Tylenol twice a day scheduled for control of underlying arthritis Low-dose meloxicam as needed, symptoms improved with same Education, reassurance and support offered

## 2012-10-20 NOTE — Assessment & Plan Note (Signed)
Poor compliance with statin as rx'd due to myalgia -  reminded of importance of same with CAD/PAD hx and stents Pt agrees to re-try pravastatin - erx done

## 2012-10-25 ENCOUNTER — Other Ambulatory Visit: Payer: Self-pay | Admitting: Internal Medicine

## 2012-10-26 ENCOUNTER — Telehealth: Payer: Self-pay | Admitting: Cardiovascular Disease

## 2012-10-26 NOTE — Telephone Encounter (Signed)
Returned call to patient no answer.Left message will send message to Dr.McAlhany's nurse for advice.

## 2012-10-26 NOTE — Telephone Encounter (Signed)
New Prob     Pt wants to know if she is due to for any testing. Please call.

## 2012-10-26 NOTE — Telephone Encounter (Signed)
Received call from patient she stated she would like to be seen soon.Stated she is tiring easy,pain in both legs,chest pain off and on,sob.Stated she prefers to see Dr.McAlhany.Message sent to Dr.McAlhany's nurse for appointment.

## 2012-10-27 NOTE — Telephone Encounter (Signed)
Spoke with pt and appt made for her to see Dr. Clifton James on October 29, 2012 at 9:45

## 2012-10-29 ENCOUNTER — Ambulatory Visit (INDEPENDENT_AMBULATORY_CARE_PROVIDER_SITE_OTHER): Payer: No Typology Code available for payment source | Admitting: Cardiovascular Disease

## 2012-10-29 ENCOUNTER — Encounter: Payer: Self-pay | Admitting: Cardiovascular Disease

## 2012-10-29 VITALS — BP 110/68 | HR 59 | Ht 66.0 in | Wt 256.1 lb

## 2012-10-29 DIAGNOSIS — I2510 Atherosclerotic heart disease of native coronary artery without angina pectoris: Secondary | ICD-10-CM

## 2012-10-29 DIAGNOSIS — E785 Hyperlipidemia, unspecified: Secondary | ICD-10-CM

## 2012-10-29 DIAGNOSIS — I779 Disorder of arteries and arterioles, unspecified: Secondary | ICD-10-CM

## 2012-10-29 DIAGNOSIS — I10 Essential (primary) hypertension: Secondary | ICD-10-CM

## 2012-10-29 MED ORDER — FUROSEMIDE 20 MG PO TABS: 20.0000 mg | ORAL_TABLET | Freq: Every day | ORAL | Status: DC

## 2012-10-29 NOTE — Patient Instructions (Addendum)
Your physician wants you to follow-up in: 3-4 months. You will receive a reminder letter in the mail two months in advance. If you don't receive a letter, please call our office to schedule the follow-up appointment.   Your physician has requested that you have a carotid duplex. This test is an ultrasound of the carotid arteries in your neck. It looks at blood flow through these arteries that supply the brain with blood. Allow one hour for this exam. There are no restrictions or special instructions.   Your physician has recommended you make the following change in your medication: Stop Coreg.  Increase furosemide to 20 mg by mouth daily

## 2012-10-29 NOTE — Progress Notes (Signed)
History of Present Illness: 62 yo AAF with h/o CAD, hyperlipidemia, hypothyroidism, HTN here for cardiac follow up. She has been followed in the past by Dr. Juanda Chance. She had an MI in 06/2009 and had 2 DES placed in the RCA. She had chest pains leading to a stress test and cardiac cath in December 2012. Cath per Dr. Riley Kill with stable CAD. Carotid Dopplers 03/25/11: RICA 60-79%; LICA 0-39%. She has had complaints of leg pain in past. ABI normal May 2013. She was seen in our office May 2014 by Norma Fredrickson, NP and had c/o fatigue and chest pain with generalized complaints. Stress myoview June 2014 with no ischemia, normal LV function.   She is here today for follow up. She is still having occasional chest pains which are all at rest. No exertional chest pain. No dyspnea. She has pain in left leg, mostly around left knee and worsened with movement. Overall fatigued. She has gained 3 lbs in last few weeks. No lower ext edema.   Primary Care Physician: Felicity Coyer  Last Lipid Profile:Lipid Panel     Component Value Date/Time   CHOL 236* 08/02/2012 1713   TRIG 230.0* 08/02/2012 1713   HDL 43.60 08/02/2012 1713   CHOLHDL 5 08/02/2012 1713   VLDL 46.0* 08/02/2012 1713   LDLCALC 159* 03/16/2011 0453     Past Medical History  Diagnosis Date  . HYPOTHYROIDISM     post ablation tx Graves  . HYPERLIPIDEMIA   . HYPERTENSION   . CAD 06/2009    s/p MI 4/11: tx with DES x 2 to RCA;  LHC 03/17/11: LAD 30-40%, distal LAD 30-40%, OM2 40-50%, RCA stent patent, EF greater than 70%.  . CAROTID STENOSIS     carotid Dopplers 03/25/11: RICA 60-79%; LICA 0-39%.  Follow up recommended in 6 months.  . PVD   . VITAMIN D DEFICIENCY     DEXA 04/2009 normal  . TOBACCO ABUSE quit 06/2009    Past Surgical History  Procedure Laterality Date  . Carotid stent      bilateral carotid artery disease post stent of left carotid  . Radioactive plaque insertion      Graves disease post radioactive treatment complicated  hypothyroidism    Current Outpatient Prescriptions  Medication Sig Dispense Refill  . acetaminophen (TYLENOL) 500 MG tablet Take 2 tablets (1,000 mg total) by mouth 2 (two) times daily.  30 tablet  0  . amLODipine (NORVASC) 5 MG tablet Take 1 tablet (5 mg total) by mouth daily before breakfast.  30 tablet  5  . aspirin 325 MG tablet Take 81 mg by mouth daily.       . carvedilol (COREG) 3.125 MG tablet TAKE ONE TABLET BY MOUTH TWICE DAILY  60 tablet  5  . clopidogrel (PLAVIX) 75 MG tablet Take 1 tablet (75 mg total) by mouth daily.  90 tablet  6  . colchicine (COLCRYS) 0.6 MG tablet 0.6 mg daily as needed.       . cyclobenzaprine (FLEXERIL) 5 MG tablet Take 1 tablet (5 mg total) by mouth 3 (three) times daily as needed for muscle spasms.  30 tablet  1  . fluticasone (FLONASE) 50 MCG/ACT nasal spray USE 2 SPRAYS IN EACH NOSTRIL ONCE A DAY  16 g  2  . fluticasone (VERAMYST) 27.5 MCG/SPRAY nasal spray Place 2 sprays into the nose daily as needed for rhinitis.      . furosemide (LASIX) 20 MG tablet Take 1 tablet (20 mg total) by  mouth daily. As needed for fluid/swelling  30 tablet  3  . lisinopril-hydrochlorothiazide (PRINZIDE,ZESTORETIC) 20-12.5 MG per tablet Take 2 tablets by mouth daily before breakfast.      . meloxicam (MOBIC) 7.5 MG tablet Take 1 tablet (7.5 mg total) by mouth daily.  30 tablet  3  . nitroGLYCERIN (NITROSTAT) 0.4 MG SL tablet Place 0.4 mg under the tongue every 5 (five) minutes as needed.        . pravastatin (PRAVACHOL) 40 MG tablet Take 1 tablet (40 mg total) by mouth at bedtime.  30 tablet  6  . thyroid (ARMOUR THYROID) 90 MG tablet Take 1 tablet (90 mg total) by mouth daily.  30 tablet  6  . traZODone (DESYREL) 100 MG tablet Take 1 tablet (100 mg total) by mouth at bedtime.  30 tablet  5  . VENTOLIN HFA 108 (90 BASE) MCG/ACT inhaler       . zolpidem (AMBIEN) 10 MG tablet Take 1 tablet (10 mg total) by mouth at bedtime as needed for sleep.  30 tablet  3  . [DISCONTINUED]  metoprolol tartrate (LOPRESSOR) 25 MG tablet Take 1 tablet (25 mg total) by mouth 2 (two) times daily.  60 tablet  6   No current facility-administered medications for this visit.    Allergies  Allergen Reactions  . Statins Other (See Comments)    myalgia    History   Social History  . Marital Status: Divorced    Spouse Name: N/A    Number of Children: N/A  . Years of Education: N/A   Occupational History  . Not on file.   Social History Main Topics  . Smoking status: Former Smoker    Quit date: 06/30/2009  . 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  Divorced, single, lives alone- 2 grown dtr nearby, 3 g-kds  . Alcohol Use: No  . Drug Use: Not on file  . Sexual Activity: Not Currently     Comment: Quit smoking after MI- prev 1ppd x 35ys   Other Topics Concern  . Not on file   Social History Narrative  . No narrative on file    Family History  Problem Relation Age of Onset  . Coronary artery disease Other   . Hypertension Other     Review of Systems:  As stated in the HPI and otherwise negative.   BP 110/68  Pulse 59  Ht 5\' 6"  (1.676 m)  Wt 256 lb 1.9 oz (116.175 kg)  BMI 41.36 kg/m2  SpO2 97%  Physical Examination: General: Well developed, well nourished, NAD HEENT: OP clear, mucus membranes moist SKIN: warm, dry. No rashes. Neuro: No focal deficits Musculoskeletal: Muscle strength 5/5 all ext Psychiatric: Mood and affect normal Neck: No JVD, no carotid bruits, no thyromegaly, no lymphadenopathy. Lungs:Clear bilaterally, no wheezes, rhonci, crackles Cardiovascular: Regular rate and rhythm. No murmurs, gallops or rubs. Abdomen:Soft. Bowel sounds present. Non-tender.  Extremities: No lower extremity edema. Pulses are 2 + in the bilateral DP/PT.  Cardiac cath 03/17/11:  Left mainstem: No significant disease   Left anterior descending (LAD): 30-40% at takeoff of D2, and 30-40%  more distally. No critical narrowing. Studies were compared to old films. ? Minimal progression of disease in LAD.   Left circumflex (LCx): Provides 2 OM branches. OM2 has segmental plaque of 40-50%, similar to prior study.   Right coronary artery (RCA): Has previously placed overlapping DES (  Promus) in the mid RCA. 20% narrowing prior to stent. No in stent restenosis noted, with widely patent stents. 10-20% eccentric plaque in distal vessel prior to smaller caliber PDA takeoff. PLA is also relatively small.   Left ventriculography: Ventricular ectopy, so no sinus beats. However, hyperdynamic post PVC function with EF >70%. No WMA on post beats.   Final Conclusions:   1. Continued patency of the RCA DES stents from 2011   2. Minimal LAD progression, no critical disease involving the LAD or LCX.   3. Preserved overall LV systolic function.    Stress myoview 08/17/12: Stress Procedure: The patient received IV Lexiscan 0.4 mg over 15-seconds. Technetium 17m Sestamibi injected at 30-seconds. Quantitative spect images were obtained after a 45 minute delay.  Stress ECG: No significant change from baseline ECG  QPS  Raw Data Images: Normal; no motion artifact; normal heart/lung ratio.  Stress Images: Normal homogeneous uptake in all areas of the myocardium.  Rest Images: Normal homogeneous uptake in all areas of the myocardium.  Subtraction (SDS): No evidence of ischemia.  Transient Ischemic Dilatation (Normal <1.22): 1.02  Lung/Heart Ratio (Normal <0.45): 0.24  Quantitative Gated Spect Images  QGS EDV: 83 ml  QGS ESV: 20 ml  Impression  Exercise Capacity: Lexiscan with no exercise.  BP Response: Normal blood pressure response.  Clinical Symptoms: No chest pain.  ECG Impression: No significant ST segment change suggestive of ischemia.  Comparison with Prior Nuclear Study: No significant change from previous study  Overall Impression: Normal stress nuclear study.  LV Ejection Fraction: 76%. LV  Wall Motion: NL LV Function; NL Wall Motion   Assessment and Plan:   1. CAD: Stable. Continue current therapy. Recent stress test without ischemia. With fatigue will stop Coreg. She will also begin taking Lasix 20 mg po Qdaily with recent weight gain.   2. Carotid artery disease: Moderate disease by doppler. Repeat carotids due now.   3. HTN: BP controlled.   4. HLD: She is on a statin.

## 2012-11-01 ENCOUNTER — Encounter (INDEPENDENT_AMBULATORY_CARE_PROVIDER_SITE_OTHER): Payer: No Typology Code available for payment source

## 2012-11-01 ENCOUNTER — Telehealth: Payer: Self-pay | Admitting: *Deleted

## 2012-11-01 DIAGNOSIS — I6529 Occlusion and stenosis of unspecified carotid artery: Secondary | ICD-10-CM

## 2012-11-01 DIAGNOSIS — I779 Disorder of arteries and arterioles, unspecified: Secondary | ICD-10-CM

## 2012-11-01 DIAGNOSIS — M25562 Pain in left knee: Secondary | ICD-10-CM

## 2012-11-01 NOTE — Telephone Encounter (Signed)
Spoke with pt advised of MDs note

## 2012-11-01 NOTE — Telephone Encounter (Signed)
Ortho refer done - [facility] will call re: same

## 2012-11-01 NOTE — Telephone Encounter (Signed)
Pt called states as per her Cardiologist she needs a referral to an Orthopedic for her knee.  Please advise

## 2012-12-15 ENCOUNTER — Ambulatory Visit: Payer: No Typology Code available for payment source | Admitting: Cardiovascular Disease

## 2012-12-24 ENCOUNTER — Other Ambulatory Visit: Payer: Self-pay | Admitting: Internal Medicine

## 2013-01-12 ENCOUNTER — Ambulatory Visit (INDEPENDENT_AMBULATORY_CARE_PROVIDER_SITE_OTHER): Payer: No Typology Code available for payment source | Admitting: Internal Medicine

## 2013-01-12 ENCOUNTER — Encounter: Payer: Self-pay | Admitting: Internal Medicine

## 2013-01-12 VITALS — BP 120/68 | HR 63 | Temp 98.4°F | Wt 255.8 lb

## 2013-01-12 DIAGNOSIS — F32A Depression, unspecified: Secondary | ICD-10-CM

## 2013-01-12 DIAGNOSIS — F3289 Other specified depressive episodes: Secondary | ICD-10-CM

## 2013-01-12 DIAGNOSIS — E039 Hypothyroidism, unspecified: Secondary | ICD-10-CM

## 2013-01-12 DIAGNOSIS — F329 Major depressive disorder, single episode, unspecified: Secondary | ICD-10-CM

## 2013-01-12 DIAGNOSIS — M79609 Pain in unspecified limb: Secondary | ICD-10-CM

## 2013-01-12 DIAGNOSIS — Z124 Encounter for screening for malignant neoplasm of cervix: Secondary | ICD-10-CM

## 2013-01-12 DIAGNOSIS — M79605 Pain in left leg: Secondary | ICD-10-CM

## 2013-01-12 MED ORDER — SERTRALINE HCL 50 MG PO TABS: 50.0000 mg | ORAL_TABLET | Freq: Every day | ORAL | Status: DC

## 2013-01-12 NOTE — Assessment & Plan Note (Signed)
Chronic, diffuse and bilateral PAD evaluation by cardiology reviewed - Ortho evaluation by Dr. Dion Saucier August 2014 also reviewed ?lumbr radicular symptoms -patient adamant that she does not have back pain and declines back imaging at this time Advised wearing compression hose and use of Tylenol twice a day scheduled for control of underlying arthritis Low-dose meloxicam as needed, symptoms improved with same Education, reassurance and support offered Follow up with Ortho as needed

## 2013-01-12 NOTE — Patient Instructions (Signed)
It was good to see you today.  We have reviewed your prior records including labs and tests today  Medications reviewed and updated -  Start sertraline once daily once daily for depression  Your prescription(s) have been submitted to your pharmacy. Please take as directed and contact our office if you believe you are having problem(s) with the medication(s).  Followup with orthopedic and endocrinology specialists as discussed  Wear your compression hose each morning, remove at bedtime -as discussed  Please schedule followup in 3-4 months, call sooner if problems.  Refer to gynecology as requested

## 2013-01-12 NOTE — Progress Notes (Signed)
Subjective:    Patient ID: Caitlyn [name], female    DOB: [birth year], 62 y.o.   MRN: [id number]  HPI Also reviewed chronic medical issues:  CAD - MI 06/2009 with 2 DES to RCA - no anginal symptoms - follows infreq with cards for same - reports noncompliance with ongoing medical treatment but denies adverse side effects related to rx'd therapy. chronic vague SSCP: nonexertional, not positional with repeat card eval: cath 03/17/11 - no cardiac problems found    dyslipidemia - intol of statin trials (atorva, crestor, prava) due to myalgia (improved off med) - tried on welchol but poor tol due to constipation -  hypothyroid - hx graves - reports compliance with ongoing medical treatment and no changes in medication dose or frequency. denies adverse side effects related to current therapy. no skin or bowel changes  HTN - reports variable compliance with ongoing medical treatment and no changes in medication dose or frequency. denies adverse side effects related to current therapy.    complains of continued fatigue - Overlap with ongoing chronic depression symptoms >6 mo: poor sleep, easy tearfulness, no energy - no SI/HI  Past Medical History  Diagnosis Date  . HYPOTHYROIDISM     post ablation tx Graves  . HYPERLIPIDEMIA   . HYPERTENSION   . CAD 06/2009    s/p MI 4/11: tx with DES x 2 to RCA;  LHC 03/17/11: LAD 30-40%, distal LAD 30-40%, OM2 40-50%, RCA stent patent, EF greater than 70%.  . CAROTID STENOSIS     carotid Dopplers 03/25/11: RICA 60-79%; LICA 0-39%.  Follow up recommended in 6 months.  . PVD   . VITAMIN D DEFICIENCY     DEXA 04/2009 normal  . TOBACCO ABUSE quit 06/2009    Review of Systems  Constitutional: Positive for fatigue.  HENT: Negative for ear discharge and mouth sores.   Respiratory: Negative for cough and shortness of breath.   Neurological: Negative for dizziness and facial asymmetry.  Psychiatric/Behavioral: Positive for sleep disturbance, dysphoric mood and  decreased concentration. Negative for suicidal ideas and self-injury. The patient is not nervous/anxious.         Objective:   Physical Exam BP 120/68  Pulse 63  Temp(Src) 98.4 F (36.9 C) (Oral)  Wt 255 lb 12.8 oz (116.03 kg)  BMI 41.31 kg/m2  SpO2 97% Wt Readings from Last 3 Encounters:  01/12/13 255 lb 12.8 oz (116.03 kg)  10/29/12 256 lb 1.9 oz (116.175 kg)  10/20/12 254 lb 9.6 oz (115.486 kg)   Constitutional: She is obese, but appears well-developed and well-nourished. No distress.  Neck: Normal range of motion. Neck supple. No JVD present. No thyromegaly present.  Cardiovascular: Normal rate, regular rhythm and normal heart sounds.  No murmur heard. No BLE edema. Pulmonary/Chest: Effort normal and breath sounds normal. No respiratory distress. She has no wheezes.  MSkel: B knee - boggy synovitis - tender to palpation over joint line; FROM and ligamentous function intact. Back: full range of motion of thoracic and lumbar spine. Non tender to palpation. Negative straight leg raise. DTR's are symmetrically intact. Sensation intact in all dermatomes of the lower extremities. Full strength to manual muscle testing. patient is able to heel toe walk without difficulty and ambulates with antalgic gait. Psychiatric: She has a dysphoric mood and affect. Her behavior is normal. Judgment and thought content normal.   Lab Results  Component Value Date   WBC 7.6 08/02/2012   HGB 13.5 08/02/2012   HCT 39.5 08/02/2012  PLT 215.0 08/02/2012   GLUCOSE 68* 08/02/2012   CHOL 236* 08/02/2012   TRIG 230.0* 08/02/2012   HDL 43.60 08/02/2012   LDLDIRECT 156.4 08/02/2012   LDLCALC 159* 03/16/2011   ALT 16 02/06/2012   AST 19 02/06/2012   NA 139 08/02/2012   K 4.0 08/02/2012   CL 105 08/02/2012   CREATININE 1.2 08/02/2012   BUN 20 08/02/2012   CO2 27 08/02/2012   TSH 1.91 08/02/2012   INR 1.03 03/16/2011       Assessment & Plan:  See problem list. Medications and labs reviewed today.  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 continue low-dose anti-inflammatory with tylenol ES 1000mg  bid -  I again declined to provide handicap tag for patient in regards to this diagnosis today

## 2013-01-12 NOTE — Assessment & Plan Note (Signed)
Chronic symptoms - associated with poor enery, irritability and insomnia Encouraged compliance SSRI - not taking traz or prozac, changed to sertraline - new rx provided Offered counseling refer, pt declines - states family and friends available support affirms no SI/HI

## 2013-01-12 NOTE — Assessment & Plan Note (Signed)
Hx Graves with RAI (remote) Patient still feels very strongly that her weight gain, fatigue, myalgias are similar to Graves' symptomatology  Reviewed recent normal TFTs Eval by endo specialist evaluation summer 2014 reviewed: No significant change in symptoms from levothyroxin to Armour Thyroid so indep resumed levothyroxin  Lab Results  Component Value Date   TSH 1.91 08/02/2012

## 2013-01-12 NOTE — Progress Notes (Signed)
Pre-visit discussion using our clinic review tool. No additional management support is needed unless otherwise documented below in the visit note.  

## 2013-01-25 ENCOUNTER — Other Ambulatory Visit: Payer: Self-pay

## 2013-01-25 ENCOUNTER — Other Ambulatory Visit: Payer: Self-pay | Admitting: Internal Medicine

## 2013-01-25 DIAGNOSIS — Z1231 Encounter for screening mammogram for malignant neoplasm of breast: Secondary | ICD-10-CM

## 2013-01-28 ENCOUNTER — Ambulatory Visit (INDEPENDENT_AMBULATORY_CARE_PROVIDER_SITE_OTHER): Payer: No Typology Code available for payment source | Admitting: Obstetrics & Gynecology

## 2013-01-28 ENCOUNTER — Encounter: Payer: Self-pay | Admitting: Obstetrics & Gynecology

## 2013-01-28 VITALS — BP 119/70 | HR 60 | Ht 67.0 in | Wt 251.5 lb

## 2013-01-28 DIAGNOSIS — N949 Unspecified condition associated with female genital organs and menstrual cycle: Secondary | ICD-10-CM

## 2013-01-28 DIAGNOSIS — Z01419 Encounter for gynecological examination (general) (routine) without abnormal findings: Secondary | ICD-10-CM

## 2013-01-28 DIAGNOSIS — Z124 Encounter for screening for malignant neoplasm of cervix: Secondary | ICD-10-CM

## 2013-01-28 DIAGNOSIS — N898 Other specified noninflammatory disorders of vagina: Secondary | ICD-10-CM

## 2013-01-28 NOTE — Patient Instructions (Signed)
Vaginitis Vaginitis is an inflammation of the vagina. It is most often caused by a change in the normal balance of the bacteria and yeast that live in the vagina. This change in balance causes an overgrowth of certain bacteria or yeast, which causes the inflammation. There are different types of vaginitis, but the most common types are:  Bacterial vaginosis.  Yeast infection (candidiasis).  Trichomoniasis vaginitis. This is a sexually transmitted infection (STI).  Viral vaginitis.  Atropic vaginitis.  Allergic vaginitis. CAUSES  The cause depends on the type of vaginitis. Vaginitis can be caused by:  Bacteria (bacterial vaginosis).  Yeast (yeast infection).  A parasite (trichomoniasis vaginitis)  A virus (viral vaginitis).  Low hormone levels (atrophic vaginitis). Low hormone levels can occur during pregnancy, breastfeeding, or after menopause.  Irritants, such as bubble baths, scented tampons, and feminine sprays (allergic vaginitis). Other factors can change the normal balance of the yeast and bacteria that live in the vagina. These include:  Antibiotic medicines.  Poor hygiene.  Diaphragms, vaginal sponges, spermicides, birth control pills, and intrauterine devices (IUD).  Sexual intercourse.  Infection.  Uncontrolled diabetes.  A weakened immune system. SYMPTOMS  Symptoms can vary depending on the cause of the vaginitis. Common symptoms include:  Abnormal vaginal discharge.  The discharge is white, gray, or yellow with bacterial vaginosis.  The discharge is thick, white, and cheesy with a yeast infection.  The discharge is frothy and yellow or greenish with trichomoniasis.  A bad vaginal odor.  The odor is fishy with bacterial vaginosis.  Vaginal itching, pain, or swelling.  Painful intercourse.  Pain or burning when urinating. Sometimes, there are no symptoms. TREATMENT  Treatment will vary depending on the type of infection.   Bacterial  vaginosis and trichomoniasis are often treated with antibiotic creams or pills.  Yeast infections are often treated with antifungal medicines, such as vaginal creams or suppositories.  Viral vaginitis has no cure, but symptoms can be treated with medicines that relieve discomfort. Your sexual partner should be treated as well.  Atrophic vaginitis may be treated with an estrogen cream, pill, suppository, or vaginal ring. If vaginal dryness occurs, lubricants and moisturizing creams may help. You may be told to avoid scented soaps, sprays, or douches.  Allergic vaginitis treatment involves quitting the use of the product that is causing the problem. Vaginal creams can be used to treat the symptoms. HOME CARE INSTRUCTIONS   Take all medicines as directed by your caregiver.  Keep your genital area clean and dry. Avoid soap and only rinse the area with water.  Avoid douching. It can remove the healthy bacteria in the vagina.  Do not use tampons or have sexual intercourse until your vaginitis has been treated. Use sanitary pads while you have vaginitis.  Wipe from front to back. This avoids the spread of bacteria from the rectum to the vagina.  Let air reach your genital area.  Wear cotton underwear to decrease moisture buildup.  Avoid wearing underwear while you sleep until your vaginitis is gone.  Avoid tight pants and underwear or nylons without a cotton panel.  Take off wet clothing (especially bathing suits) as soon as possible.  Use mild, non-scented products. Avoid using irritants, such as:  Scented feminine sprays.  Fabric softeners.  Scented detergents.  Scented tampons.  Scented soaps or bubble baths.  Practice safe sex and use condoms. Condoms may prevent the spread of trichomoniasis and viral vaginitis. SEEK MEDICAL CARE IF:   You have abdominal pain.  You   have a fever or persistent symptoms for more than 2 3 days.  You have a fever and your symptoms suddenly  get worse. Document Released: 12/29/2006 Document Revised: 11/26/2011 Document Reviewed: 08/14/2011 Exit[employer] Patient Information 2014 [employer].  

## 2013-01-28 NOTE — Progress Notes (Signed)
Patient ID: HALLE DAVLIN, female   DOB: 05/02/1950, 62 y.o.   MRN: [id number] Subjective:     AMEAH CHANDA is a 62 y.o. female here for a routine exam.  Current complaints: pt c/o 2 weeks of a vaginal odor. Pt recently changed her soap. She denies pain or other assoc sx.       Gynecologic History No LMP recorded. Patient is postmenopausal. Contraception: abstinence Last Pap: 2011. Results were: normal Last mammogram: 02/2012. Results were: normal  Obstetric History OB History  No data available     The following portions of the patient's history were reviewed and updated as appropriate: allergies, current medications, past family history, past medical history, past social history, past surgical history and problem list.  Review of Systems Pertinent items are noted in HPI.    Objective:    BP 119/70  Pulse 60  Ht 5\' 7"  (1.702 m)  Wt 251 lb 8 oz (114.08 kg)  BMI 39.38 kg/m2  General Appearance:    Alert, cooperative, no distress, appears stated age              Throat:   Lips, mucosa, and tongue normal; teeth and gums normal  Neck:   Supple, symmetrical, trachea midline, no adenopathy;    thyroid:  no enlargement/tenderness/nodules; no carotid   bruit or JVD  Back:     Symmetric, no curvature, ROM normal, no CVA tenderness  Lungs:     Clear to auscultation bilaterally, respirations unlabored  Chest Wall:    No tenderness or deformity   Heart:    Regular rate and rhythm, S1 and S2 normal, no murmur, rub   or gallop  Breast Exam:    No tenderness, masses, or nipple abnormality  Abdomen:     Soft, non-tender, bowel sounds active all four quadrants,    no masses, no organomegaly; obese  Genitalia:    Normal female without lesion, discharge or tenderness     Extremities:   Extremities normal, atraumatic, no cyanosis or edema  Pulses:   2+ and symmetric all extremities  Skin:   Skin color, texture, turgor normal, no rashes or lesions            Assessment:    Healthy female exam.  Vaginal odor- rec change soap to unscented    Plan:    Follow up in: 1 year.   Stop all scented feminine soap F/u wet mount  F/u PAP with HPV and trich

## 2013-01-29 LAB — WET PREP, GENITAL
Trich, Wet Prep: NONE SEEN
Yeast Wet Prep HPF POC: NONE SEEN

## 2013-02-04 ENCOUNTER — Ambulatory Visit: Payer: No Typology Code available for payment source | Admitting: Cardiovascular Disease

## 2013-02-08 ENCOUNTER — Telehealth: Payer: Self-pay | Admitting: General Practice

## 2013-02-08 NOTE — Telephone Encounter (Signed)
Patient called and left message stating she saw Dr Katrinka Blazing on 11/14 and has not heard anything back yet about her pap results and could someone call her back. Called patient and informed her of normal pap smear results. Patient verbalized understanding and had no further questions

## 2013-02-16 ENCOUNTER — Encounter: Payer: No Typology Code available for payment source | Admitting: Obstetrics & Gynecology

## 2013-02-19 ENCOUNTER — Emergency Department (HOSPITAL_COMMUNITY): Payer: No Typology Code available for payment source

## 2013-02-19 ENCOUNTER — Encounter (HOSPITAL_COMMUNITY): Payer: Self-pay | Admitting: Emergency Medicine

## 2013-02-19 ENCOUNTER — Emergency Department (HOSPITAL_COMMUNITY)
Admission: EM | Admit: 2013-02-19 | Discharge: 2013-02-19 | Disposition: A | Discharge status: Home / Self Care | Payer: No Typology Code available for payment source | Attending: Emergency Medicine | Admitting: Emergency Medicine

## 2013-02-19 DIAGNOSIS — I2510 Atherosclerotic heart disease of native coronary artery without angina pectoris: Secondary | ICD-10-CM | POA: Insufficient documentation

## 2013-02-19 DIAGNOSIS — M109 Gout, unspecified: Secondary | ICD-10-CM | POA: Insufficient documentation

## 2013-02-19 DIAGNOSIS — Z7982 Long term (current) use of aspirin: Secondary | ICD-10-CM | POA: Insufficient documentation

## 2013-02-19 DIAGNOSIS — I10 Essential (primary) hypertension: Secondary | ICD-10-CM | POA: Insufficient documentation

## 2013-02-19 DIAGNOSIS — Z8739 Personal history of other diseases of the musculoskeletal system and connective tissue: Secondary | ICD-10-CM

## 2013-02-19 DIAGNOSIS — J069 Acute upper respiratory infection, unspecified: Secondary | ICD-10-CM

## 2013-02-19 DIAGNOSIS — M25572 Pain in left ankle and joints of left foot: Secondary | ICD-10-CM

## 2013-02-19 DIAGNOSIS — Z791 Long term (current) use of non-steroidal anti-inflammatories (NSAID): Secondary | ICD-10-CM | POA: Insufficient documentation

## 2013-02-19 DIAGNOSIS — Z79899 Other long term (current) drug therapy: Secondary | ICD-10-CM | POA: Insufficient documentation

## 2013-02-19 DIAGNOSIS — E785 Hyperlipidemia, unspecified: Secondary | ICD-10-CM | POA: Insufficient documentation

## 2013-02-19 DIAGNOSIS — M25579 Pain in unspecified ankle and joints of unspecified foot: Secondary | ICD-10-CM | POA: Insufficient documentation

## 2013-02-19 DIAGNOSIS — IMO0002 Reserved for concepts with insufficient information to code with codable children: Secondary | ICD-10-CM | POA: Insufficient documentation

## 2013-02-19 DIAGNOSIS — Z7902 Long term (current) use of antithrombotics/antiplatelets: Secondary | ICD-10-CM | POA: Insufficient documentation

## 2013-02-19 DIAGNOSIS — E039 Hypothyroidism, unspecified: Secondary | ICD-10-CM | POA: Insufficient documentation

## 2013-02-19 DIAGNOSIS — Z87891 Personal history of nicotine dependence: Secondary | ICD-10-CM | POA: Insufficient documentation

## 2013-02-19 LAB — POCT I-STAT, CHEM 8
BUN: 25 mg/dL — ABNORMAL HIGH (ref 6–23)
Chloride: 100 mEq/L (ref 96–112)
Creatinine, Ser: 1.3 mg/dL — ABNORMAL HIGH (ref 0.50–1.10)
Hemoglobin: 14.3 g/dL (ref 12.0–15.0)
Potassium: 3.9 mEq/L (ref 3.5–5.1)
Sodium: 141 mEq/L (ref 135–145)

## 2013-02-19 LAB — POCT I-STAT TROPONIN I

## 2013-02-19 MED ORDER — PREDNISONE 20 MG PO TABS: 60.0000 mg | ORAL_TABLET | Freq: Every day | ORAL | Status: DC

## 2013-02-19 MED ORDER — HYDROMORPHONE HCL PF 1 MG/ML IJ SOLN
1.0000 mg | Freq: Once | INTRAMUSCULAR | Status: AC
  Administered 2013-02-19: 1 mg via INTRAMUSCULAR
  Filled 2013-02-19: qty 1

## 2013-02-19 MED ORDER — OXYCODONE-ACETAMINOPHEN 5-325 MG PO TABS
1.0000 | ORAL_TABLET | Freq: Once | ORAL | Status: AC
  Administered 2013-02-19: 1 via ORAL
  Filled 2013-02-19: qty 1

## 2013-02-19 MED ORDER — PREDNISONE 20 MG PO TABS
60.0000 mg | ORAL_TABLET | Freq: Once | ORAL | Status: AC
  Administered 2013-02-19: 60 mg via ORAL
  Filled 2013-02-19: qty 3

## 2013-02-19 MED ORDER — OXYCODONE-ACETAMINOPHEN 5-325 MG PO TABS: 1.0000 | ORAL_TABLET | Freq: Four times a day (QID) | ORAL | Status: DC | PRN

## 2013-02-19 NOTE — ED Notes (Addendum)
C/o chest pain, congestion, body aches X couple of weeks, denies nausea or vomiting, coughing up phlegm, c/o left leg swelling with increased pain

## 2013-02-19 NOTE — ED Provider Notes (Signed)
CSN: [id number]     Arrival date & time 02/19/13  1715 History   First MD Initiated Contact with Patient 02/19/13 1738     Chief Complaint  Patient presents with  . Chest Pain  . Nasal Congestion   (Consider location/radiation/quality/duration/timing/severity/associated sxs/prior Treatment) HPI Comments: Patient is a 62 yo F PMHx significant for HLD, HTN, CAD, Carotid Stenosis, PVD, Hypothyroidism, Arthritis, h/o Gout (not on daily medications) presenting to the ED for two complaints. The patient's first complaint is left lower leg pain w/ ankle swelling. She described her pain as severe throbbing. She states that her left leg has been painful for multiple months and has been evaluated by the [facility] and diagnoses as arthritis. Patient states over the last month she has noticed her pain has increased, but denies any falls or trauma to the extremity. The patient was advised use Mobic and wear a brace to help with pain, but patient has not been using either remedies. Patient's second complaint is one to 2 weeks of productive cough with nasal congestion and chest tightness. Patient states that her symptoms are alleviated with Alka-Seltzer cough and cold. She denies any aggravating factors. Patient is unsure about any sick contacts. She has not discussed her symptoms with her PCP.   Patient is a 62 y.o. female presenting with chest pain.  Chest Pain Associated symptoms: cough   Associated symptoms: no fever, no headache, no nausea and no shortness of breath     Past Medical History  Diagnosis Date  . HYPOTHYROIDISM     post ablation tx Graves  . HYPERLIPIDEMIA   . HYPERTENSION   . CAD 06/2009    s/p MI 4/11: tx with DES x 2 to RCA;  LHC 03/17/11: LAD 30-40%, distal LAD 30-40%, OM2 40-50%, RCA stent patent, EF greater than 70%.  . CAROTID STENOSIS     carotid Dopplers 03/25/11: RICA 60-79%; LICA 0-39%.  Follow up recommended in 6 months.  . PVD   . VITAMIN D DEFICIENCY    DEXA 04/2009 normal  . TOBACCO ABUSE quit 06/2009   Past Surgical History  Procedure Laterality Date  . Carotid stent      bilateral carotid artery disease post stent of left carotid  . Radioactive plaque insertion      Graves disease post radioactive treatment complicated hypothyroidism   Family History  Problem Relation Age of Onset  . Coronary artery disease Other   . Hypertension Other    History  Substance Use Topics  . Smoking status: Former Smoker    Quit date: 06/30/2009  . 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  Divorced, single, lives alone- 2 grown dtr nearby, 3 g-kds  . Alcohol Use: No   OB History   Grav Para Term Preterm Abortions TAB SAB Ect Mult Living                 Review of Systems  Constitutional: Negative for fever.  Respiratory: Positive for cough and chest tightness. Negative for shortness of breath and wheezing.   Cardiovascular: Negative for chest pain.  Gastrointestinal: Negative for nausea.  Musculoskeletal: Positive for arthralgias, joint swelling and myalgias.  Skin: Negative for color change.  Neurological: Negative for headaches.  All other systems reviewed and are negative.    Allergies  Statins  Home Medications   Current Outpatient Rx  Name  Route  Sig  Dispense  Refill  . albuterol (PROVENTIL HFA;VENTOLIN HFA) 108 (90 BASE) MCG/ACT inhaler   Inhalation   Inhale 1 puff into the lungs every 6 (six) hours as needed for wheezing or shortness of breath.         Marland Kitchen amLODipine (NORVASC) 5 MG tablet   Oral   Take 5 mg by mouth daily before breakfast.         . aspirin 325 MG tablet   Oral   Take 81 mg by mouth daily.          . clopidogrel (PLAVIX) 75 MG tablet   Oral   Take 1 tablet (75 mg total) by mouth daily.   90 tablet   6   . colchicine (COLCRYS) 0.6 MG tablet      0.6 mg daily as needed (for flare ups).          . cyclobenzaprine  (FLEXERIL) 5 MG tablet   Oral   Take 5 mg by mouth 3 (three) times daily as needed for muscle spasms.         . fluticasone (FLONASE) 50 MCG/ACT nasal spray   Each Nare   Place 2 sprays into both nostrils daily.         . furosemide (LASIX) 20 MG tablet   Oral   Take 20 mg by mouth daily.         Marland Kitchen levothyroxine (SYNTHROID, LEVOTHROID) 112 MCG tablet   Oral   Take 112 mcg by mouth daily before breakfast.         . lisinopril-hydrochlorothiazide (PRINZIDE,ZESTORETIC) 20-12.5 MG per tablet   Oral   Take 2 tablets by mouth daily before breakfast.         . meloxicam (MOBIC) 7.5 MG tablet   Oral   Take 7.5 mg by mouth daily.         . nitroGLYCERIN (NITROSTAT) 0.4 MG SL tablet   Sublingual   Place 0.4 mg under the tongue every 5 (five) minutes as needed.          . pravastatin (PRAVACHOL) 40 MG tablet   Oral   Take 1 tablet (40 mg total) by mouth at bedtime.   30 tablet   6   . traZODone (DESYREL) 100 MG tablet   Oral   Take 100 mg by mouth at bedtime.         Marland Kitchen zolpidem (AMBIEN) 10 MG tablet   Oral   Take 10 mg by mouth at bedtime as needed for sleep.         Marland Kitchen oxyCODONE-acetaminophen (PERCOCET/ROXICET) 5-325 MG per tablet   Oral   Take 1-2 tablets by mouth every 6 (six) hours as needed for severe pain.   20 tablet   0   . predniSONE (DELTASONE) 20 MG tablet   Oral   Take 3 tablets (60 mg total) by mouth daily.   15 tablet   0   . sertraline (ZOLOFT) 50 MG tablet   Oral   Take 1 tablet (50 mg total) by mouth daily.   30 tablet   3    BP 146/64  Pulse 78  Temp(Src) 97.8 F (36.6 C) (Oral)  Resp 18  SpO2 97% Physical Exam  Constitutional: She is oriented to person, place, and time. She appears well-developed and well-nourished. No distress.  HENT:  Head: Normocephalic and atraumatic.  Right Ear: External ear normal.  Left Ear: External ear normal.  Nose: Nose normal.  Mouth/Throat: Oropharynx is clear and moist.  No oropharyngeal  exudate.  Eyes: Conjunctivae are normal.  Neck: Normal range of motion. Neck supple.  Cardiovascular: Normal rate, regular rhythm, normal heart sounds and intact distal pulses.   Pulmonary/Chest: Effort normal and breath sounds normal. No respiratory distress. She has no wheezes. She has no rales. She exhibits no tenderness.  Abdominal: Soft. Bowel sounds are normal. There is no tenderness.  Musculoskeletal: Normal range of motion. She exhibits tenderness.       Right knee: Normal.       Left knee: Normal.       Right ankle: Normal.       Left ankle: She exhibits swelling. She exhibits normal range of motion, no ecchymosis, no deformity, no laceration and normal pulse. Tenderness.       Right lower leg: Normal.       Left lower leg: Normal.       Right foot: Normal.       Left foot: Normal.       Feet:  Neurological: She is alert and oriented to person, place, and time.  Skin: Skin is warm and dry. She is not diaphoretic.  Psychiatric: She has a normal mood and affect.    ED Course  Procedures (including critical care time) Medications  oxyCODONE-acetaminophen (PERCOCET/ROXICET) 5-325 MG per tablet 1 tablet (1 tablet Oral Given 02/19/13 1857)  HYDROmorphone (DILAUDID) injection 1 mg (1 mg Intramuscular Given 02/19/13 2022)  predniSONE (DELTASONE) tablet 60 mg (60 mg Oral Given 02/19/13 2124)    Labs Review Labs Reviewed  POCT I-STAT, CHEM 8 - Abnormal; Notable for the following:    BUN 25 (*)    Creatinine, Ser 1.30 (*)    All other components within normal limits  POCT I-STAT TROPONIN I   Imaging Review Dg Ankle Complete Left  02/19/2013   CLINICAL DATA:  Pain and swelling, no known injury  EXAM: LEFT ANKLE COMPLETE - 3+ VIEW  COMPARISON:  None.  FINDINGS: Three views of left ankle submitted. No acute fracture or subluxation. Ankle mortise is preserved. Diffuse soft tissue swelling. Small plantar and posterior spurring of calcaneus.  IMPRESSION: No acute fracture or subluxation.   Soft tissue swelling.   Electronically Signed   By: Natasha Mead M.D.   On: 02/19/2013 18:54   Dg Chest Port 1 View  02/19/2013   CLINICAL DATA:  Cough and shortness of Breath.  EXAM: PORTABLE CHEST - 1 VIEW  COMPARISON:  04/22/2012  FINDINGS: The heart size and mediastinal contours are within normal limits. Both lungs are clear. The visualized skeletal structures are unremarkable.  IMPRESSION: No acute cardiopulmonary findings.   Electronically Signed   By: Loralie Champagne M.D.   On: 02/19/2013 17:54    EKG Interpretation    Date/Time:  Saturday February 19 2013 17:25:56 EST Ventricular Rate:  92 PR Interval:  179 QRS Duration: 94 QT Interval:  382 QTC Calculation: 473 R Axis:   99 Text Interpretation:  Normal sinus rhythm Non-specific ST-t changes `t changes laterally similar to prior ecg Confirmed by STEINL  MD, KEVIN ([id number]) on 02/19/2013 7:14:24 PM            MDM   1. Left ankle pain   2. URI (upper respiratory infection)   3. H/O: gout     Afebrile, NAD, non-toxic appearing, AAOx4. I have reviewed nursing notes, vital signs, and all appropriate lab and imaging results for this patient.Pain and symptoms managed in ED.   1) Left ankle pain: Neurovascularly intact. Normal  sensation. No erythema or warmth. X-ray did not reveal any fracture or dislocation, soft tissue swelling noted. At this time no concern for septic arthritic joint, likely related to a gout flare up. Will place patient on prednisone and pain medication on top of her home colchichine. Advised orthopedic f/u on Monday.  2) URI: Pt CXR negative for acute infiltrate. Patients symptoms are consistent with URI, likely viral etiology. Discussed that antibiotics are not indicated for viral infections. Pt will be discharged with symptomatic treatment.  Verbalizes understanding and is agreeable with plan. Pt is hemodynamically stable & in NAD prior to dc.  Return precautions discussed. Patient is agreeable to plan.  Patient is stable at time of discharge. Patient d/w with Dr. Denton Lank, agrees with plan.       Jeannetta Ellis, PA-C 02/20/13 [phone]

## 2013-02-20 NOTE — ED Provider Notes (Signed)
Medical screening examination/treatment/procedure(s) were conducted as a shared visit with non-physician practitioner(s) and myself.  I personally evaluated the patient during the encounter.  EKG Interpretation    Date/Time:  Saturday February 19 2013 17:25:56 EST Ventricular Rate:  92 PR Interval:  179 QRS Duration: 94 QT Interval:  382 QTC Calculation: 473 R Axis:   99 Text Interpretation:  Normal sinus rhythm Non-specific ST-t changes `t changes laterally similar to prior ecg Confirmed by Ares Cardozo  MD, Zayveon Raschke ([id number]) on 02/19/2013 7:14:24 PM            Pt c/o left ankle pain/swelling for few days. Notes hx gout, generally in great toe/base of great toe. Denies fever or chills. No spreading redness. No breaks in skin or wounds to area. No other joint involvement. Pt also notes mild uri symptoms, w non prod cough and congestion. On exam, left ankle tender, mildly swollen, mildly warm/inflamed. No cellulitis. Labs. Cxr.     Suzi Roots, MD 02/20/13 [phone]

## 2013-03-01 ENCOUNTER — Ambulatory Visit
Admission: RE | Admit: 2013-03-01 | Discharge: 2013-03-01 | Disposition: A | Discharge status: Home / Self Care | Payer: No Typology Code available for payment source | Source: Ambulatory Visit

## 2013-03-01 DIAGNOSIS — Z1231 Encounter for screening mammogram for malignant neoplasm of breast: Secondary | ICD-10-CM

## 2013-03-16 ENCOUNTER — Encounter: Payer: Self-pay | Admitting: Internal Medicine

## 2013-03-16 ENCOUNTER — Other Ambulatory Visit (INDEPENDENT_AMBULATORY_CARE_PROVIDER_SITE_OTHER): Payer: No Typology Code available for payment source

## 2013-03-16 ENCOUNTER — Ambulatory Visit (INDEPENDENT_AMBULATORY_CARE_PROVIDER_SITE_OTHER): Payer: No Typology Code available for payment source | Admitting: Internal Medicine

## 2013-03-16 VITALS — BP 130/70 | HR 72 | Temp 97.8°F | Resp 16 | Wt 251.0 lb

## 2013-03-16 DIAGNOSIS — E039 Hypothyroidism, unspecified: Secondary | ICD-10-CM

## 2013-03-16 DIAGNOSIS — M109 Gout, unspecified: Secondary | ICD-10-CM

## 2013-03-16 DIAGNOSIS — M25579 Pain in unspecified ankle and joints of unspecified foot: Secondary | ICD-10-CM

## 2013-03-16 DIAGNOSIS — W57XXXA Bitten or stung by nonvenomous insect and other nonvenomous arthropods, initial encounter: Secondary | ICD-10-CM

## 2013-03-16 DIAGNOSIS — I10 Essential (primary) hypertension: Secondary | ICD-10-CM

## 2013-03-16 DIAGNOSIS — I2510 Atherosclerotic heart disease of native coronary artery without angina pectoris: Secondary | ICD-10-CM

## 2013-03-16 DIAGNOSIS — M79609 Pain in unspecified limb: Secondary | ICD-10-CM

## 2013-03-16 DIAGNOSIS — T148 Other injury of unspecified body region: Secondary | ICD-10-CM

## 2013-03-16 DIAGNOSIS — F329 Major depressive disorder, single episode, unspecified: Secondary | ICD-10-CM

## 2013-03-16 DIAGNOSIS — Z862 Personal history of diseases of the blood and blood-forming organs and certain disorders involving the immune mechanism: Secondary | ICD-10-CM

## 2013-03-16 DIAGNOSIS — R5381 Other malaise: Secondary | ICD-10-CM

## 2013-03-16 DIAGNOSIS — M25572 Pain in left ankle and joints of left foot: Secondary | ICD-10-CM

## 2013-03-16 DIAGNOSIS — R609 Edema, unspecified: Secondary | ICD-10-CM | POA: Insufficient documentation

## 2013-03-16 DIAGNOSIS — T148XXA Other injury of unspecified body region, initial encounter: Secondary | ICD-10-CM

## 2013-03-16 DIAGNOSIS — M791 Myalgia, unspecified site: Secondary | ICD-10-CM

## 2013-03-16 DIAGNOSIS — E785 Hyperlipidemia, unspecified: Secondary | ICD-10-CM

## 2013-03-16 DIAGNOSIS — R079 Chest pain, unspecified: Secondary | ICD-10-CM

## 2013-03-16 DIAGNOSIS — IMO0001 Reserved for inherently not codable concepts without codable children: Secondary | ICD-10-CM

## 2013-03-16 DIAGNOSIS — I779 Disorder of arteries and arterioles, unspecified: Secondary | ICD-10-CM

## 2013-03-16 DIAGNOSIS — Z124 Encounter for screening for malignant neoplasm of cervix: Secondary | ICD-10-CM

## 2013-03-16 LAB — URINALYSIS, ROUTINE W REFLEX MICROSCOPIC
Bilirubin Urine: NEGATIVE
Specific Gravity, Urine: 1.02 (ref 1.000–1.030)
Total Protein, Urine: NEGATIVE
Urine Glucose: NEGATIVE
pH: 7 (ref 5.0–8.0)

## 2013-03-16 LAB — BASIC METABOLIC PANEL
BUN: 19 mg/dL (ref 6–23)
CO2: 31 mEq/L (ref 19–32)
Calcium: 9.1 mg/dL (ref 8.4–10.5)
Chloride: 104 mEq/L (ref 96–112)
Creatinine, Ser: 1.1 mg/dL (ref 0.4–1.2)
Glucose, Bld: 88 mg/dL (ref 70–99)
Potassium: 4 mEq/L (ref 3.5–5.1)

## 2013-03-16 LAB — T4, FREE: Free T4: 1.14 ng/dL (ref 0.60–1.60)

## 2013-03-16 LAB — SEDIMENTATION RATE: Sed Rate: 43 mm/hr — ABNORMAL HIGH (ref 0–22)

## 2013-03-16 LAB — TSH
TSH: 1.31 u[IU]/mL (ref 0.35–5.50)
TSH: 1.31 u[IU]/mL (ref 0.35–5.50)

## 2013-03-16 MED ORDER — LISINOPRIL 40 MG PO TABS: 40.0000 mg | ORAL_TABLET | Freq: Every day | ORAL | Status: DC

## 2013-03-16 MED ORDER — OXYCODONE-ACETAMINOPHEN 5-325 MG PO TABS: 1.0000 | ORAL_TABLET | Freq: Four times a day (QID) | ORAL | Status: DC | PRN

## 2013-03-16 MED ORDER — METHYLPREDNISOLONE ACETATE 80 MG/ML IJ SUSP
80.0000 mg | Freq: Once | INTRAMUSCULAR | Status: AC
  Administered 2013-03-16: 80 mg via INTRAMUSCULAR

## 2013-03-16 MED ORDER — ZOLPIDEM TARTRATE 10 MG PO TABS: 10.0000 mg | ORAL_TABLET | Freq: Every evening | ORAL | Status: DC | PRN

## 2013-03-16 MED ORDER — CYCLOBENZAPRINE HCL 5 MG PO TABS: 5.0000 mg | ORAL_TABLET | Freq: Three times a day (TID) | ORAL | Status: DC | PRN

## 2013-03-16 NOTE — Progress Notes (Signed)
Pre visit review using our clinic review tool, if applicable. No additional management support is needed unless otherwise documented below in the visit note. 

## 2013-03-16 NOTE — Progress Notes (Signed)
   Subjective:    Patient ID: Caitlyn [name], female    DOB: 1951/01/28, 62 y.o.   MRN: [id number]  Arthritis The disease course has been fluctuating. She complains of pain, stiffness and joint swelling (L ankle). Affected locations include the left ankle. Her pain is at a severity of 6/10. Associated symptoms include fatigue. Pertinent negatives include no diarrhea, rash or weight loss. Her past medical history is significant for chronic back pain. There is no history of lupus or rheumatoid arthritis.       Review of Systems  Constitutional: Positive for fatigue. Negative for weight loss.  HENT: Negative for rhinorrhea and sore throat.   Gastrointestinal: Negative for vomiting and diarrhea.  Genitourinary: Negative for flank pain and decreased urine volume.  Musculoskeletal: Positive for arthritis, joint swelling (L ankle) and stiffness.  Skin: Negative for rash and wound.  Neurological: Negative for syncope.  Psychiatric/Behavioral: Negative for suicidal ideas, confusion and decreased concentration.       Objective:   Physical Exam  Constitutional: She appears well-developed. No distress.  obese  HENT:  Head: Normocephalic.  Right Ear: External ear normal.  Left Ear: External ear normal.  Nose: Nose normal.  Mouth/Throat: Oropharynx is clear and moist.  Eyes: Conjunctivae are normal. Pupils are equal, round, and reactive to light. Right eye exhibits no discharge. Left eye exhibits no discharge.  Neck: Normal range of motion. Neck supple. No JVD present. No tracheal deviation present. No thyromegaly present.  Cardiovascular: Normal rate, regular rhythm and normal heart sounds.   Pulmonary/Chest: No stridor. No respiratory distress. She has no wheezes.  Abdominal: Soft. Bowel sounds are normal. She exhibits no distension and no mass. There is no tenderness. There is no rebound and no guarding.  Musculoskeletal: She exhibits tenderness. She exhibits no edema.  Most joints are  tender w/palp and ROM  Lymphadenopathy:    She has no cervical adenopathy.  Neurological: She displays normal reflexes. No cranial nerve deficit. She exhibits normal muscle tone. Coordination normal.  Skin: No rash noted. No erythema.  Psychiatric: She has a normal mood and affect. Her behavior is normal. Judgment and thought content normal.    Lab Results  Component Value Date   WBC 7.6 08/02/2012   HGB 14.3 02/19/2013   HCT 42.0 02/19/2013   PLT 215.0 08/02/2012   GLUCOSE 88 03/16/2013   CHOL 236* 08/02/2012   TRIG 230.0* 08/02/2012   HDL 43.60 08/02/2012   LDLDIRECT 156.4 08/02/2012   LDLCALC 159* 03/16/2011   ALT 16 02/06/2012   AST 19 02/06/2012   NA 140 03/16/2013   K 4.0 03/16/2013   CL 104 03/16/2013   CREATININE 1.1 03/16/2013   BUN 19 03/16/2013   CO2 31 03/16/2013   TSH 1.31 03/16/2013   TSH 1.31 03/16/2013   INR 1.03 03/16/2011   A complex case      Assessment & Plan:

## 2013-03-16 NOTE — Assessment & Plan Note (Signed)
Continue with current prescription therapy as reflected on the Med list. Labs  

## 2013-03-16 NOTE — Assessment & Plan Note (Signed)
Labs See meds 

## 2013-03-16 NOTE — Patient Instructions (Signed)
Hold Norvasc (amlodipine)

## 2013-03-16 NOTE — Assessment & Plan Note (Signed)
Hold Norvasc.

## 2013-03-16 NOTE — Assessment & Plan Note (Signed)
D/c Norvasc X ray ok

## 2013-03-17 NOTE — Assessment & Plan Note (Signed)
Labs

## 2013-03-18 ENCOUNTER — Telehealth: Payer: Self-pay

## 2013-03-18 NOTE — Telephone Encounter (Signed)
This will need to wait for Dr. Jonny RuizJohn to address next week. There is no rush - an elevated uric acid is a risk factor not a sign of active gout

## 2013-03-18 NOTE — Telephone Encounter (Signed)
The patient was given lab results from 03/16/13 and informed had high gout result.  MD did not send in rx for gout and patient is requesting something sent to [facility] please.

## 2013-03-20 MED ORDER — ALLOPURINOL 100 MG PO TABS: 100.0000 mg | ORAL_TABLET | Freq: Every day | ORAL | Status: DC

## 2013-03-20 NOTE — Telephone Encounter (Signed)
Done erx 

## 2013-03-21 NOTE — Telephone Encounter (Signed)
Called pt no answer LMOM rx sent to [facility]../[email or web address] 

## 2013-03-30 ENCOUNTER — Ambulatory Visit: Payer: No Typology Code available for payment source | Admitting: Endocrinology

## 2013-03-31 ENCOUNTER — Encounter: Payer: Self-pay | Admitting: *Deleted

## 2013-05-23 ENCOUNTER — Other Ambulatory Visit (HOSPITAL_COMMUNITY): Payer: Self-pay | Admitting: Cardiology

## 2013-05-23 DIAGNOSIS — I6529 Occlusion and stenosis of unspecified carotid artery: Secondary | ICD-10-CM

## 2013-05-25 ENCOUNTER — Ambulatory Visit (HOSPITAL_COMMUNITY): Payer: BC Managed Care – PPO | Attending: Cardiovascular Disease | Admitting: Cardiology

## 2013-05-25 DIAGNOSIS — I6529 Occlusion and stenosis of unspecified carotid artery: Secondary | ICD-10-CM

## 2013-05-25 NOTE — Progress Notes (Signed)
Carotid duplex complete 

## 2013-06-17 ENCOUNTER — Encounter: Payer: Self-pay | Admitting: Cardiovascular Disease

## 2013-06-17 ENCOUNTER — Ambulatory Visit (INDEPENDENT_AMBULATORY_CARE_PROVIDER_SITE_OTHER): Payer: BC Managed Care – PPO | Admitting: Cardiovascular Disease

## 2013-06-17 VITALS — BP 157/94 | HR 62 | Ht 67.0 in | Wt 251.0 lb

## 2013-06-17 DIAGNOSIS — I739 Peripheral vascular disease, unspecified: Secondary | ICD-10-CM

## 2013-06-17 DIAGNOSIS — I509 Heart failure, unspecified: Secondary | ICD-10-CM

## 2013-06-17 DIAGNOSIS — I2510 Atherosclerotic heart disease of native coronary artery without angina pectoris: Secondary | ICD-10-CM

## 2013-06-17 DIAGNOSIS — E785 Hyperlipidemia, unspecified: Secondary | ICD-10-CM

## 2013-06-17 DIAGNOSIS — I5032 Chronic diastolic (congestive) heart failure: Secondary | ICD-10-CM

## 2013-06-17 DIAGNOSIS — I779 Disorder of arteries and arterioles, unspecified: Secondary | ICD-10-CM

## 2013-06-17 DIAGNOSIS — I10 Essential (primary) hypertension: Secondary | ICD-10-CM

## 2013-06-17 MED ORDER — METOPROLOL TARTRATE 25 MG PO TABS: 25.0000 mg | ORAL_TABLET | Freq: Two times a day (BID) | ORAL | Status: DC

## 2013-06-17 NOTE — Progress Notes (Signed)
History of Present Illness: 63 yo AAF with h/o CAD, hyperlipidemia, hypothyroidism, HTN here for cardiac follow up. She has been followed in the past by Dr. Juanda ChanceBrodie. She had an MI in 06/2009 and had 2 DES placed in the RCA. She had chest pains leading to a stress test and cardiac cath in December 2012. Cath per Dr. Riley KillStuckey with stable CAD. Carotid Dopplers 03/25/11: RICA 60-79%; LICA 0-39%. She has had complaints of leg pain in past. ABI normal May 2013. She was seen in our office May 2014 by Norma FredricksonLori Gerhardt, NP and had c/o fatigue and chest pain with generalized complaints. Stress myoview June 2014 with no ischemia, normal LV function. Coreg stopped 2014 because of fatigue but she is not sure this made a difference. Norvasc stopped in primary care due to LE edema. She is taking Lasix as needed.   She is here today for follow up. No chest pains. She was taken off of Norvasc and her Lisinopril was increased. No dyspnea. No lower ext edema. She sought a second opinion from Dr. Sharyn LullHarwani for her heart disease and no changes were made. Using Lasix prn. Started on pravastatin in primary care but not taking.   Primary Care Physician: Felicity CoyerLeschber  Last Lipid Profile:Lipid Panel     Component Value Date/Time   CHOL 236* 08/02/2012 1713   TRIG 230.0* 08/02/2012 1713   HDL 43.60 08/02/2012 1713   CHOLHDL 5 08/02/2012 1713   VLDL 46.0* 08/02/2012 1713   LDLCALC 159* 03/16/2011 0453     Past Medical History  Diagnosis Date  . HYPOTHYROIDISM     post ablation tx Graves  . HYPERLIPIDEMIA   . HYPERTENSION   . CAD 06/2009    s/p MI 4/11: tx with DES x 2 to RCA;  LHC 03/17/11: LAD 30-40%, distal LAD 30-40%, OM2 40-50%, RCA stent patent, EF greater than 70%.  . CAROTID STENOSIS     carotid Dopplers 03/25/11: RICA 60-79%; LICA 0-39%.  Follow up recommended in 6 months.  . PVD   . VITAMIN D DEFICIENCY     DEXA 04/2009 normal  . TOBACCO ABUSE quit 06/2009    Past Surgical History  Procedure Laterality Date  .  Carotid stent      bilateral carotid artery disease post stent of left carotid  . Radioactive plaque insertion      Graves disease post radioactive treatment complicated hypothyroidism    Current Outpatient Prescriptions  Medication Sig Dispense Refill  . albuterol (PROVENTIL HFA;VENTOLIN HFA) 108 (90 BASE) MCG/ACT inhaler Inhale 1 puff into the lungs every 6 (six) hours as needed for wheezing or shortness of breath.      Marland Kitchen. aspirin 81 MG tablet Take 81 mg by mouth as needed for pain.      Marland Kitchen. clopidogrel (PLAVIX) 75 MG tablet Take 1 tablet (75 mg total) by mouth daily.  90 tablet  6  . colchicine (COLCRYS) 0.6 MG tablet 0.6 mg daily as needed (for flare ups).       . cyclobenzaprine (FLEXERIL) 5 MG tablet Take 1 tablet (5 mg total) by mouth 3 (three) times daily as needed for muscle spasms.  30 tablet  1  . fluticasone (FLONASE) 50 MCG/ACT nasal spray Place 2 sprays into both nostrils as needed.       . furosemide (LASIX) 20 MG tablet Take 20 mg by mouth as needed.       Marland Kitchen. levothyroxine (SYNTHROID, LEVOTHROID) 112 MCG tablet Take 112 mcg by mouth daily  before breakfast.      . lisinopril (PRINIVIL,ZESTRIL) 40 MG tablet Take 1 tablet (40 mg total) by mouth daily.  90 tablet  3  . nitroGLYCERIN (NITROSTAT) 0.4 MG SL tablet Place 0.4 mg under the tongue every 5 (five) minutes as needed.       Marland Kitchen oxyCODONE-acetaminophen (PERCOCET/ROXICET) 5-325 MG per tablet Take 1-2 tablets by mouth every 6 (six) hours as needed for severe pain.  60 tablet  0  . traZODone (DESYREL) 100 MG tablet Take 100 mg by mouth at bedtime as needed.       . zolpidem (AMBIEN) 10 MG tablet Take 1 tablet (10 mg total) by mouth at bedtime as needed for sleep.  30 tablet  1  . allopurinol (ZYLOPRIM) 100 MG tablet Take 1 tablet (100 mg total) by mouth daily.  90 tablet  3  . [DISCONTINUED] metoprolol tartrate (LOPRESSOR) 25 MG tablet Take 1 tablet (25 mg total) by mouth 2 (two) times daily.  60 tablet  6   No current  facility-administered medications for this visit.    Allergies  Allergen Reactions  . Statins Other (See Comments)    myalgia    History   Social History  . Marital Status: Divorced    Spouse Name: N/A    Number of Children: N/A  . Years of Education: N/A   Occupational History  . Not on file.   Social History Main Topics  . Smoking status: Former Smoker    Quit date: 06/30/2009  . 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  Divorced, single, lives alone- 2 grown dtr nearby, 3 g-kds  . Alcohol Use: No  . Drug Use: Not on file  . Sexual Activity: Not Currently     Comment: Quit smoking after MI- prev 1ppd x 35ys   Other Topics Concern  . Not on file   Social History Narrative  . No narrative on file    Family History  Problem Relation Age of Onset  . Coronary artery disease Other   . Hypertension Other     Review of Systems:  As stated in the HPI and otherwise negative.   BP 157/94  Pulse 62  Ht 5\' 7"  (1.702 m)  Wt 251 lb (113.853 kg)  BMI 39.30 kg/m2  Physical Examination: General: Well developed, well nourished, NAD HEENT: OP clear, mucus membranes moist SKIN: warm, dry. No rashes. Neuro: No focal deficits Musculoskeletal: Muscle strength 5/5 all ext Psychiatric: Mood and affect normal Neck: No JVD, no carotid bruits, no thyromegaly, no lymphadenopathy. Lungs:Clear bilaterally, no wheezes, rhonci, crackles Cardiovascular: Regular rate and rhythm. No murmurs, gallops or rubs. Abdomen:Soft. Bowel sounds present. Non-tender.  Extremities: No lower extremity edema. Pulses are 2 + in the bilateral DP/PT.  Cardiac cath 03/17/11:  Left mainstem: No significant disease   Left anterior descending (LAD): 30-40% at takeoff of D2, and 30-40% more distally. No critical narrowing. Studies were compared to old films. ? Minimal progression of disease in LAD.   Left circumflex (LCx): Provides 2  OM branches. OM2 has segmental plaque of 40-50%, similar to prior study.   Right coronary artery (RCA): Has previously placed overlapping DES (Promus) in the mid RCA. 20% narrowing prior to stent. No in stent restenosis noted, with widely patent stents. 10-20% eccentric plaque in distal vessel prior to smaller caliber PDA takeoff. PLA is also relatively small.   Left ventriculography: Ventricular ectopy,  so no sinus beats. However, hyperdynamic post PVC function with EF >70%. No WMA on post beats.   Final Conclusions:   1. Continued patency of the RCA DES stents from 2011   2. Minimal LAD progression, no critical disease involving the LAD or LCX.   3. Preserved overall LV systolic function.    Stress myoview 08/17/12: Stress Procedure: The patient received IV Lexiscan 0.4 mg over 15-seconds. Technetium 14m Sestamibi injected at 30-seconds. Quantitative spect images were obtained after a 45 minute delay.  Stress ECG: No significant change from baseline ECG  QPS  Raw Data Images: Normal; no motion artifact; normal heart/lung ratio.  Stress Images: Normal homogeneous uptake in all areas of the myocardium.  Rest Images: Normal homogeneous uptake in all areas of the myocardium.  Subtraction (SDS): No evidence of ischemia.  Transient Ischemic Dilatation (Normal <1.22): 1.02  Lung/Heart Ratio (Normal <0.45): 0.24  Quantitative Gated Spect Images  QGS EDV: 83 ml  QGS ESV: 20 ml  Impression  Exercise Capacity: Lexiscan with no exercise.  BP Response: Normal blood pressure response.  Clinical Symptoms: No chest pain.  ECG Impression: No significant ST segment change suggestive of ischemia.  Comparison with Prior Nuclear Study: No significant change from previous study  Overall Impression: Normal stress nuclear study.  LV Ejection Fraction: 76%. LV Wall Motion: NL LV Function; NL Wall Motion   Assessment and Plan:   1. CAD: Stable. Continue current therapy. Stress test 2014 without ischemia.     2. Carotid artery disease: Moderate disease by doppler March 2015. Repeat in 6 months.   3. HTN: BP elevated. She did not tolerate Norvasc. Lisinopril is at max dose. Will add Lopressor 25 mg po BID. She will f/u in primary care for further titration of meds.   4. HLD: She has been started on low dose statin (?Pravastatin) in primary care. Lipids need better control. Goal LDL under 100. She is followed in primary care.  5. Chronic diastolic CHF: She will use Lasix as needed.

## 2013-06-17 NOTE — Patient Instructions (Signed)
Your physician wants you to follow-up in: 6 months.    You will receive a reminder letter in the mail two months in advance. If you don't receive a letter, please call our office to schedule the follow-up appointment  Your physician has recommended you make the following change in your medication: Start lopressor 25 mg by mouth twice daily

## 2013-06-20 ENCOUNTER — Other Ambulatory Visit: Payer: Self-pay | Admitting: Internal Medicine

## 2013-06-28 ENCOUNTER — Telehealth: Payer: Self-pay | Admitting: *Deleted

## 2013-06-28 NOTE — Telephone Encounter (Signed)
Pt called requesting a refill on Ambien.  Please advise

## 2013-06-29 ENCOUNTER — Other Ambulatory Visit: Payer: Self-pay | Admitting: *Deleted

## 2013-06-29 MED ORDER — ZOLPIDEM TARTRATE 10 MG PO TABS: 10.0000 mg | ORAL_TABLET | Freq: Every evening | ORAL | Status: DC | PRN

## 2013-06-29 NOTE — Telephone Encounter (Signed)
Ok - please print and i will sign 

## 2013-06-29 NOTE — Telephone Encounter (Signed)
A user error has taken place: encounter opened in error, closed for administrative reasons.

## 2013-06-30 NOTE — Telephone Encounter (Signed)
Duplicate phone note.

## 2013-06-30 NOTE — Telephone Encounter (Signed)
Faxed script back to [facility].../lmb 

## 2013-08-03 ENCOUNTER — Other Ambulatory Visit: Payer: Self-pay | Admitting: Internal Medicine

## 2013-08-09 ENCOUNTER — Emergency Department (HOSPITAL_COMMUNITY)
Admission: EM | Admit: 2013-08-09 | Discharge: 2013-08-09 | Discharge status: Against Medical Advice | Payer: BC Managed Care – PPO | Attending: Emergency Medicine | Admitting: Emergency Medicine

## 2013-08-09 ENCOUNTER — Encounter (HOSPITAL_COMMUNITY): Payer: Self-pay | Admitting: Emergency Medicine

## 2013-08-09 DIAGNOSIS — I10 Essential (primary) hypertension: Secondary | ICD-10-CM | POA: Insufficient documentation

## 2013-08-09 DIAGNOSIS — M79609 Pain in unspecified limb: Secondary | ICD-10-CM | POA: Insufficient documentation

## 2013-08-09 DIAGNOSIS — I2510 Atherosclerotic heart disease of native coronary artery without angina pectoris: Secondary | ICD-10-CM | POA: Insufficient documentation

## 2013-08-09 NOTE — ED Notes (Signed)
Patient is alert and oriented x3.  She is complaining of right foot pain that has been on going For two weeks.  She states that she has not been taking her gout medications on a regular  Basis and today the pain has gotten to the point where the pain is unbearable.  Currently  She rates her pain 10 of 10.

## 2013-08-10 ENCOUNTER — Emergency Department (HOSPITAL_COMMUNITY)
Admission: EM | Admit: 2013-08-10 | Discharge: 2013-08-10 | Disposition: A | Discharge status: Home / Self Care | Payer: BC Managed Care – PPO | Attending: Emergency Medicine | Admitting: Emergency Medicine

## 2013-08-10 ENCOUNTER — Encounter (HOSPITAL_COMMUNITY): Payer: Self-pay | Admitting: Emergency Medicine

## 2013-08-10 DIAGNOSIS — I10 Essential (primary) hypertension: Secondary | ICD-10-CM | POA: Insufficient documentation

## 2013-08-10 DIAGNOSIS — M109 Gout, unspecified: Secondary | ICD-10-CM | POA: Insufficient documentation

## 2013-08-10 DIAGNOSIS — IMO0002 Reserved for concepts with insufficient information to code with codable children: Secondary | ICD-10-CM | POA: Insufficient documentation

## 2013-08-10 DIAGNOSIS — Z87891 Personal history of nicotine dependence: Secondary | ICD-10-CM | POA: Insufficient documentation

## 2013-08-10 DIAGNOSIS — Z79899 Other long term (current) drug therapy: Secondary | ICD-10-CM | POA: Insufficient documentation

## 2013-08-10 DIAGNOSIS — E039 Hypothyroidism, unspecified: Secondary | ICD-10-CM | POA: Insufficient documentation

## 2013-08-10 DIAGNOSIS — Z7902 Long term (current) use of antithrombotics/antiplatelets: Secondary | ICD-10-CM | POA: Insufficient documentation

## 2013-08-10 DIAGNOSIS — Z9861 Coronary angioplasty status: Secondary | ICD-10-CM | POA: Insufficient documentation

## 2013-08-10 DIAGNOSIS — Z8639 Personal history of other endocrine, nutritional and metabolic disease: Secondary | ICD-10-CM | POA: Insufficient documentation

## 2013-08-10 DIAGNOSIS — I2510 Atherosclerotic heart disease of native coronary artery without angina pectoris: Secondary | ICD-10-CM | POA: Insufficient documentation

## 2013-08-10 DIAGNOSIS — I739 Peripheral vascular disease, unspecified: Secondary | ICD-10-CM | POA: Insufficient documentation

## 2013-08-10 MED ORDER — OXYCODONE-ACETAMINOPHEN 5-325 MG PO TABS: 1.0000 | ORAL_TABLET | ORAL | Status: DC | PRN

## 2013-08-10 MED ORDER — COLCHICINE 0.6 MG PO TABS
1.2000 mg | ORAL_TABLET | Freq: Once | ORAL | Status: DC
  Filled 2013-08-10: qty 2

## 2013-08-10 MED ORDER — PREDNISONE 20 MG PO TABS: 40.0000 mg | ORAL_TABLET | Freq: Every day | ORAL | Status: DC

## 2013-08-10 MED ORDER — PREDNISONE 20 MG PO TABS
60.0000 mg | ORAL_TABLET | Freq: Once | ORAL | Status: AC
  Administered 2013-08-10: 60 mg via ORAL
  Filled 2013-08-10: qty 3

## 2013-08-10 MED ORDER — IBUPROFEN 200 MG PO TABS
400.0000 mg | ORAL_TABLET | Freq: Once | ORAL | Status: AC
  Administered 2013-08-10: 400 mg via ORAL
  Filled 2013-08-10: qty 2

## 2013-08-10 MED ORDER — OXYCODONE-ACETAMINOPHEN 5-325 MG PO TABS
2.0000 | ORAL_TABLET | Freq: Once | ORAL | Status: AC
  Administered 2013-08-10: 2 via ORAL
  Filled 2013-08-10: qty 2

## 2013-08-10 NOTE — ED Provider Notes (Signed)
CSN: [id number]     Arrival date & time 08/10/13  0706 History   First MD Initiated Contact with Patient 08/10/13 [phone]     Chief Complaint  Patient presents with  . Foot Pain    right big toe     (Consider location/radiation/quality/duration/timing/severity/associated sxs/prior Treatment) HPI  63 year old female with pain in her right foot. Gradual onset about 2 weeks ago, recently worse with the past 2 days. Denies new trauma. She has a history of gout. She reports similar symptoms in the past previous exacerbations. She is prescribed both colchicine and allopurinol. She takes allopurinol inconsistently and when speaking with her further it seems like she has some confusion about dosing. No fever or chills. No calf pain or swelling.   Past Medical History  Diagnosis Date  . HYPOTHYROIDISM     post ablation tx Graves  . HYPERLIPIDEMIA   . HYPERTENSION   . CAD 06/2009    s/p MI 4/11: tx with DES x 2 to RCA;  LHC 03/17/11: LAD 30-40%, distal LAD 30-40%, OM2 40-50%, RCA stent patent, EF greater than 70%.  . CAROTID STENOSIS     carotid Dopplers 03/25/11: RICA 60-79%; LICA 0-39%.  Follow up recommended in 6 months.  . PVD   . VITAMIN D DEFICIENCY     DEXA 04/2009 normal  . TOBACCO ABUSE quit 06/2009   Past Surgical History  Procedure Laterality Date  . Carotid stent      bilateral carotid artery disease post stent of left carotid  . Radioactive plaque insertion      Graves disease post radioactive treatment complicated hypothyroidism   Family History  Problem Relation Age of Onset  . Coronary artery disease Other   . Hypertension Other    History  Substance Use Topics  . Smoking status: Former Smoker    Quit date: 06/30/2009  . 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  Divorced, single, lives alone- 2 grown dtr nearby, 3 g-kds  . Alcohol Use: No   OB History   Grav Para Term Preterm Abortions TAB SAB  Ect Mult Living                 Review of Systems  All systems reviewed and negative, other than as noted in HPI.   Allergies  Statins  Home Medications   Prior to Admission medications   Medication Sig Start Date End Date Taking? Authorizing Provider  albuterol (PROVENTIL HFA;VENTOLIN HFA) 108 (90 BASE) MCG/ACT inhaler Inhale 1 puff into the lungs every 6 (six) hours as needed for wheezing or shortness of breath.    Historical Provider, MD  allopurinol (ZYLOPRIM) 100 MG tablet Take 1 tablet (100 mg total) by mouth daily. 03/20/13   Corwin Levins, MD  aspirin 81 MG tablet Take 81 mg by mouth as needed for pain.    Historical Provider, MD  clopidogrel (PLAVIX) 75 MG tablet Take 1 tablet (75 mg total) by mouth daily. 11/11/11   Romero Belling, MD  colchicine (COLCRYS) 0.6 MG tablet 0.6 mg daily as needed (for flare ups).  07/19/12   Newt Lukes, MD  COLCRYS 0.6 MG tablet TAKE 1 TABLET (0.6 MG TOTAL) BY MOUTH DAILY.    Newt Lukes, MD  cyclobenzaprine (FLEXERIL) 5 MG tablet Take 1 tablet (5 mg total) by mouth 3 (three) times daily as needed for muscle spasms. 03/16/13   Tresa Garter, MD  fluticasone (FLONASE) 50 MCG/ACT nasal spray Place 2 sprays into both nostrils as needed.     Historical Provider, MD  furosemide (LASIX) 20 MG tablet Take 20 mg by mouth as needed.  10/29/12   Kathleene Hazelhristopher D McAlhany, MD  levothyroxine (SYNTHROID, LEVOTHROID) 112 MCG tablet Take 112 mcg by mouth daily before breakfast.    Historical Provider, MD  levothyroxine (SYNTHROID, LEVOTHROID) 112 MCG tablet TAKE 1 TABLET (112 MCG TOTAL) BY MOUTH DAILY. 06/20/13   Newt LukesValerie A Leschber, MD  lisinopril (PRINIVIL,ZESTRIL) 40 MG tablet Take 1 tablet (40 mg total) by mouth daily. 03/16/13   Aleksei Plotnikov V, MD  metoprolol tartrate (LOPRESSOR) 25 MG tablet Take 1 tablet (25 mg total) by mouth 2 (two) times daily. 06/17/13   Kathleene Hazelhristopher D McAlhany, MD  nitroGLYCERIN (NITROSTAT) 0.4 MG SL tablet Place 0.4 mg under  the tongue every 5 (five) minutes as needed.     Historical Provider, MD  oxyCODONE-acetaminophen (PERCOCET/ROXICET) 5-325 MG per tablet Take 1-2 tablets by mouth every 6 (six) hours as needed for severe pain. 03/16/13   Aleksei Plotnikov V, MD  traZODone (DESYREL) 100 MG tablet Take 100 mg by mouth at bedtime as needed.  10/20/12   Newt LukesValerie A Leschber, MD  zolpidem (AMBIEN) 10 MG tablet Take 1 tablet (10 mg total) by mouth at bedtime as needed for sleep. 06/29/13   Newt LukesValerie A Leschber, MD   BP 178/86  Pulse 57  Temp(Src) 98 F (36.7 C) (Oral)  Resp 20  SpO2 98% Physical Exam  Nursing note and vitals reviewed. Constitutional: She appears well-developed and well-nourished. No distress.  HENT:  Head: Normocephalic and atraumatic.  Eyes: Conjunctivae are normal. Right eye exhibits no discharge. Left eye exhibits no discharge.  Neck: Neck supple.  Cardiovascular: Normal rate, regular rhythm and normal heart sounds.  Exam reveals no gallop and no friction rub.   No murmur heard. Pulmonary/Chest: Effort normal and breath sounds normal. No respiratory distress.  Abdominal: Soft. She exhibits no distension. There is no tenderness.  Musculoskeletal: She exhibits no edema and no tenderness.  Faint erythema along the base of the right first MP joint and the foot. Localized increase in warmth. Very tender to palpation in attempt to range of motion of the big toe.   Neurological: She is alert.  Skin: Skin is warm and dry.  Psychiatric: She has a normal mood and affect. Her behavior is normal. Thought content normal.    ED Course  Procedures (including critical care time) Labs Review Labs Reviewed - No data to display  Imaging Review No results found.   EKG Interpretation None      MDM   Final diagnoses:  Podagra    [age]-year-old female with atraumatic pain in her right foot. History of gout. Suspect the same. Some confusion that patient in terms of her medications. Reviewed allopurinol  and colchicine with her. She already is prescribed these. Will additionally give PRN opiate and a few days of prednisone. Doubt infectious cause. Doubt DVT. No hx of acute trauma.     Raeford RazorStephen Medrith Veillon, MD 08/10/13 [id number]

## 2013-08-10 NOTE — Discharge Instructions (Signed)

## 2013-08-10 NOTE — ED Notes (Signed)
She c/o non-traumatic right great toe pain "coming on for two weeks now".  She states she was confused a but and missed some doses of her allopurinol, but has been on daily colchicine for over a month now.  She is in no distress.

## 2013-08-10 NOTE — ED Notes (Signed)
Pt c/o right big toe pain that started about two weeks ago and has been taking her meds for gout but the pain has progressed.

## 2013-09-01 ENCOUNTER — Ambulatory Visit: Payer: BC Managed Care – PPO | Admitting: Internal Medicine

## 2013-09-12 ENCOUNTER — Other Ambulatory Visit: Payer: Self-pay | Admitting: *Deleted

## 2013-09-12 ENCOUNTER — Ambulatory Visit: Payer: BC Managed Care – PPO | Admitting: Internal Medicine

## 2013-09-12 DIAGNOSIS — Z0289 Encounter for other administrative examinations: Secondary | ICD-10-CM

## 2013-09-12 MED ORDER — ZOLPIDEM TARTRATE 10 MG PO TABS: 10.0000 mg | ORAL_TABLET | Freq: Every evening | ORAL | Status: DC | PRN

## 2013-09-12 NOTE — Telephone Encounter (Signed)
Left msg on triage requesting refill on her ambien...Raechel Chute/lmb

## 2013-09-12 NOTE — Telephone Encounter (Signed)
Notified [facility]...Raechel Chute/lmb

## 2013-10-14 ENCOUNTER — Telehealth: Payer: Self-pay | Admitting: Cardiovascular Disease

## 2013-10-14 NOTE — Telephone Encounter (Signed)
New Message  Pt called reporting Chest discomfort and weak legs// ongoing for 3 weeks. Offerd ov with Scott [facility] for 10/17/2013. Pt declined.

## 2013-10-19 ENCOUNTER — Ambulatory Visit (INDEPENDENT_AMBULATORY_CARE_PROVIDER_SITE_OTHER): Payer: BC Managed Care – PPO | Admitting: Internal Medicine

## 2013-10-19 ENCOUNTER — Encounter: Payer: Self-pay | Admitting: Internal Medicine

## 2013-10-19 ENCOUNTER — Other Ambulatory Visit: Payer: Self-pay | Admitting: Internal Medicine

## 2013-10-19 ENCOUNTER — Other Ambulatory Visit (INDEPENDENT_AMBULATORY_CARE_PROVIDER_SITE_OTHER): Payer: BC Managed Care – PPO

## 2013-10-19 VITALS — BP 178/96 | HR 67 | Temp 98.1°F | Wt 258.1 lb

## 2013-10-19 DIAGNOSIS — J069 Acute upper respiratory infection, unspecified: Secondary | ICD-10-CM

## 2013-10-19 DIAGNOSIS — I10 Essential (primary) hypertension: Secondary | ICD-10-CM

## 2013-10-19 DIAGNOSIS — IMO0001 Reserved for inherently not codable concepts without codable children: Secondary | ICD-10-CM

## 2013-10-19 DIAGNOSIS — R5381 Other malaise: Secondary | ICD-10-CM

## 2013-10-19 DIAGNOSIS — R5383 Other fatigue: Secondary | ICD-10-CM

## 2013-10-19 LAB — CBC WITH DIFFERENTIAL/PLATELET
BASOS PCT: 0.5 % (ref 0.0–3.0)
Basophils Absolute: 0 10*3/uL (ref 0.0–0.1)
EOS ABS: 0.2 10*3/uL (ref 0.0–0.7)
Eosinophils Relative: 2.6 % (ref 0.0–5.0)
HCT: 41.3 % (ref 36.0–46.0)
HEMOGLOBIN: 13.6 g/dL (ref 12.0–15.0)
LYMPHS PCT: 35.1 % (ref 12.0–46.0)
Lymphs Abs: 2.3 10*3/uL (ref 0.7–4.0)
MCHC: 33 g/dL (ref 30.0–36.0)
MCV: 91.4 fl (ref 78.0–100.0)
Monocytes Absolute: 0.6 10*3/uL (ref 0.1–1.0)
Monocytes Relative: 8.7 % (ref 3.0–12.0)
NEUTROS ABS: 3.5 10*3/uL (ref 1.4–7.7)
Neutrophils Relative %: 53.1 % (ref 43.0–77.0)
Platelets: 212 10*3/uL (ref 150.0–400.0)
RBC: 4.52 Mil/uL (ref 3.87–5.11)
RDW: 14.6 % (ref 11.5–15.5)
WBC: 6.6 10*3/uL (ref 4.0–10.5)

## 2013-10-19 LAB — CK: CK TOTAL: 179 U/L — AB (ref 7–177)

## 2013-10-19 LAB — BASIC METABOLIC PANEL
BUN: 15 mg/dL (ref 6–23)
CHLORIDE: 103 meq/L (ref 96–112)
CO2: 27 mEq/L (ref 19–32)
CREATININE: 0.9 mg/dL (ref 0.4–1.2)
Calcium: 8.8 mg/dL (ref 8.4–10.5)
GFR: 81.94 mL/min (ref >60.00)
GLUCOSE: 73 mg/dL (ref 70–99)
Potassium: 4 mEq/L (ref 3.5–5.1)
Sodium: 138 mEq/L (ref 135–145)

## 2013-10-19 LAB — TSH: TSH: 2.52 u[IU]/mL (ref 0.35–4.50)

## 2013-10-19 LAB — MAGNESIUM: Magnesium: 2.2 mg/dL (ref 1.5–2.5)

## 2013-10-19 MED ORDER — AMOXICILLIN 500 MG PO CAPS: 500.0000 mg | ORAL_CAPSULE | Freq: Three times a day (TID) | ORAL | Status: DC

## 2013-10-19 MED ORDER — SPIRONOLACTONE 25 MG PO TABS: 25.0000 mg | ORAL_TABLET | Freq: Every day | ORAL | Status: DC

## 2013-10-19 NOTE — Patient Instructions (Signed)
Your next office appointment will be determined based upon review of your pending labs. Those instructions will be transmitted to you by mail. Minimal Blood Pressure Goal= AVERAGE < 140/90;  Ideal is an AVERAGE < 135/85. This AVERAGE should be calculated from @ least 5-7 BP readings taken @ different times of day on different days of week. You should not respond to isolated BP readings , but rather the AVERAGE for that week .Please bring your  blood pressure cuff to office visits to verify that it is reliable.It  can also be checked against the blood pressure device at the pharmacy. Finger or wrist cuffs are not dependable; an arm cuff is.DO NOT MISS YOUR EVENING DOSES

## 2013-10-19 NOTE — Progress Notes (Signed)
   Subjective:    Patient ID: Caitlyn [name], female    DOB: 06/09/1950, 63 y.o.   MRN: [id number]  HPI  Over the last week she's had respiratory tract symptoms with sore throat, rhinitis, sneezing, sweats, and subsequently cough. She is producing some  brownish secretions  from the sinuses as well as from her chest. She said that te greater  volume comes from the head.  She has taken Alka-Seltzer plus without benefit.  She has not smoked for 5 years. She quit at the time of her heart attack.  She is not monitoring her blood pressure at home. She is unsure how often she misses the second dose of blood pressure medicine.  She has a history of treated Graves' disease      Review of Systems   She has no frontal headache, facial pain, dental pain, earache, otic discharge  The cough is not associated with wheezing or shortness of breath. She decribes some chest discomfort with coughing but no exertional chest pain..  The sweats are not associated with fever or chills.  Sneezing is not associated with itchy, watery eyes.  She describes significant cramping pain in her legs for at least a month.  She has a history of statin-induced myalgias. She is not on a statin now despite her history of coronary disease.  She describes significant fatigue     Objective:   Physical Exam  Positive or pertinent physical findings include:  As per CDC Guidelines ,Epic documents severe obesity as being present . Proptosis without lid lag. AV crossing changes on fundal exam. Upper dental plate. Breath sounds are decreased without increased work of breathing Heart sounds are also distant. Abdomen is protuberant. There is dullness to percussion right upper quadrant. Deep tendon reflexes 0+ at the knees She has valgus deformities of the knees. 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  General appearance :adequately nourished; in no distress. Eyes: No conjunctival inflammation or scleral icterus  is present. Oral exam: Lips and gums are healthy appearing.There is no oropharyngeal erythema or exudate noted.  Heart:  Normal rate and regular rhythm. S1 and S2 normal without gallop, murmur, click, rub or other extra sounds   Lungs:Chest clear to auscultation; no wheezes, rhonchi,rales ,or rubs present.No increased work of breathing.  Abdomen: bowel sounds normal, soft and non-tender without masses or definite  organomegaly .No hernias noted.  No guarding or rebound. No flank tenderness to percussion. Skin:Warm & dry.  Intact without suspicious lesions or rashes ; no jaundice or tenting Lymphatic: No lymphadenopathy is noted about the head, neck, axilla         Assessment & Plan:  #1 acute respiratory tract infection with purulent secretions  #2 hypertension, uncontrolled. This is associated with approximately 12 fold increased risk of heart attack or stroke. This was shared with her. Strict compliance with her blood pressure medications was recommended. Aldactone added pending labs. HCTZ not an option due to PMH of gout  #3 myalgias  #4 fatigue in context of PMH of Graves' disease  Plan: See orders and recommendations

## 2013-10-19 NOTE — Progress Notes (Signed)
Pre visit review using our clinic review tool, if applicable. No additional management support is needed unless otherwise documented below in the visit note. 

## 2013-10-20 NOTE — Telephone Encounter (Signed)
Faxed script back to [facility].../lmb 

## 2013-10-25 ENCOUNTER — Telehealth: Payer: Self-pay | Admitting: Internal Medicine

## 2013-10-25 NOTE — Telephone Encounter (Signed)
Pt called request lab result that was done on 10/19/13. Please call pt

## 2013-10-25 NOTE — Telephone Encounter (Signed)
Left message for patient to return call. Also mailed labs

## 2013-10-25 NOTE — Telephone Encounter (Signed)
Patient has been advised of lab results.  °

## 2013-10-25 NOTE — Telephone Encounter (Signed)
Dr Alwyn RenHopper,   I do not see comments placed on lab from 10/19/13. Please advise.

## 2013-10-25 NOTE — Telephone Encounter (Signed)
See results

## 2013-11-22 ENCOUNTER — Other Ambulatory Visit: Payer: Self-pay | Admitting: Internal Medicine

## 2013-11-25 ENCOUNTER — Other Ambulatory Visit (HOSPITAL_COMMUNITY): Payer: Self-pay | Admitting: *Deleted

## 2013-11-25 DIAGNOSIS — I6529 Occlusion and stenosis of unspecified carotid artery: Secondary | ICD-10-CM

## 2013-11-28 ENCOUNTER — Ambulatory Visit (HOSPITAL_COMMUNITY): Payer: BC Managed Care – PPO | Attending: Cardiovascular Disease | Admitting: Cardiology

## 2013-11-28 ENCOUNTER — Telehealth: Payer: Self-pay | Admitting: *Deleted

## 2013-11-28 DIAGNOSIS — I6529 Occlusion and stenosis of unspecified carotid artery: Secondary | ICD-10-CM | POA: Insufficient documentation

## 2013-11-28 DIAGNOSIS — E785 Hyperlipidemia, unspecified: Secondary | ICD-10-CM | POA: Diagnosis not present

## 2013-11-28 DIAGNOSIS — I739 Peripheral vascular disease, unspecified: Secondary | ICD-10-CM | POA: Diagnosis not present

## 2013-11-28 DIAGNOSIS — I2510 Atherosclerotic heart disease of native coronary artery without angina pectoris: Secondary | ICD-10-CM | POA: Insufficient documentation

## 2013-11-28 DIAGNOSIS — Z87891 Personal history of nicotine dependence: Secondary | ICD-10-CM | POA: Diagnosis not present

## 2013-11-28 DIAGNOSIS — I10 Essential (primary) hypertension: Secondary | ICD-10-CM | POA: Insufficient documentation

## 2013-11-28 NOTE — Progress Notes (Signed)
Carotid duplex performed 

## 2013-11-28 NOTE — Telephone Encounter (Signed)
Received walk in pt form that was completed when pt here for carotid doppler studies today. Form notes pt would like call to discuss cough she has had for several months, green mucous and tired legs.  I spoke with pt who reports legs hurt when getting up and walking. OK at rest. This has been going on for awhile. Also has cough for several weeks. Greenish mucous. Saw primary care last month and was treated for sinus infection. I asked pt to contact primary care to discuss cough.  I told pt to discuss tired legs at office visit on December 05, 2013 with Dr. Clifton James. Pt states she is unable to be here for this appt.  Appt cancelled and new appt made for pt to see Tereso Newcomer, PA on December 12, 2013 at 2:20.

## 2013-12-05 ENCOUNTER — Ambulatory Visit: Payer: BC Managed Care – PPO | Admitting: Cardiovascular Disease

## 2013-12-12 ENCOUNTER — Encounter: Payer: BC Managed Care – PPO | Admitting: Physician Assistant

## 2013-12-12 NOTE — Progress Notes (Deleted)
Cardiology Office Note    Date:  12/12/2013   ID:  LATAJAH THUMAN, DOB [birth year], MRN [id number]  PCP:  Rene Paci, MD  Cardiologist:  Dr. Verne Carrow     History of Present Illness: Caitlyn [name] is a 63 y.o. female with a history of CAD, status post MI in 06/2009 treated with a DES x2 to RCA, diastolic CHF, HTN, HL, hypothyroidism, carotid stenosis. Her last cardiac catheterization in 2012 demonstrated patent stents and no significant change in CAD elsewhere.  Last seen by Dr. Clifton James 06/2013.  ***   Studies:  - LHC (12/12):  LAD 30-40%, OM2 40-50%, RCA stents patent with 20% prior to the stent, dist RCA 10-20%, EF > 70%  - Echo (4/11):  EF 55-60%, no RWMA  - Nuclear (6/14):  No ischemia, EF 76%; Normal study  - Carotid US (9/15):  R 60-79%, L 40-59%, normal subclavians, occl L vertebral, patent R vertebral >> FU 6 mos   Recent Labs/Images:  10/19/2013: BUN 15; Creatinine 0.9; Hemoglobin 13.6; Potassium 4.0; Sodium 138; TSH 2.52; WBC 6.6    No results found.   Wt Readings from Last 3 Encounters:  10/19/13 258 lb 2 oz (117.085 kg)  08/09/13 249 lb (112.946 kg)  06/17/13 251 lb (113.853 kg)     Past Medical History  Diagnosis Date  . HYPOTHYROIDISM     post ablation tx Graves  . HYPERLIPIDEMIA   . HYPERTENSION   . CAD 06/2009    s/p MI 4/11: tx with DES x 2 to RCA;  LHC 03/17/11: LAD 30-40%, distal LAD 30-40%, OM2 40-50%, RCA stent patent, EF greater than 70%.  . CAROTID STENOSIS     carotid Dopplers 03/25/11: RICA 60-79%; LICA 0-39%.  Follow up recommended in 6 months.  . PVD   . VITAMIN D DEFICIENCY     DEXA 04/2009 normal  . TOBACCO ABUSE quit 06/2009    Current Outpatient Prescriptions  Medication Sig Dispense Refill  . albuterol (PROVENTIL HFA;VENTOLIN HFA) 108 (90 BASE) MCG/ACT inhaler Inhale 1 puff into the lungs every 6 (six) hours as needed for wheezing or shortness of breath.      . allopurinol (ZYLOPRIM) 100 MG tablet Take 1 tablet (100  mg total) by mouth daily.  90 tablet  3  . amoxicillin (AMOXIL) 500 MG capsule Take 1 capsule (500 mg total) by mouth 3 (three) times daily.  30 capsule  0  . aspirin 81 MG tablet Take 81 mg by mouth as needed for pain.      Marland Kitchen clopidogrel (PLAVIX) 75 MG tablet Take 1 tablet (75 mg total) by mouth daily.  90 tablet  6  . colchicine (COLCRYS) 0.6 MG tablet 0.6 mg daily as needed (for flare ups).       . cyclobenzaprine (FLEXERIL) 5 MG tablet Take 1 tablet (5 mg total) by mouth 3 (three) times daily as needed for muscle spasms.  30 tablet  1  . fluticasone (FLONASE) 50 MCG/ACT nasal spray Place 2 sprays into both nostrils as needed.       . furosemide (LASIX) 20 MG tablet Take 20 mg by mouth as needed.       Marland Kitchen levothyroxine (SYNTHROID, LEVOTHROID) 112 MCG tablet TAKE 1 TABLET (112 MCG TOTAL) BY MOUTH DAILY.  30 tablet  5  . lisinopril (PRINIVIL,ZESTRIL) 40 MG tablet Take 1 tablet (40 mg total) by mouth daily.  90 tablet  3  . metoprolol tartrate (LOPRESSOR) 25 MG tablet Take 1  tablet (25 mg total) by mouth 2 (two) times daily.  60 tablet  11  . nitroGLYCERIN (NITROSTAT) 0.4 MG SL tablet Place 0.4 mg under the tongue every 5 (five) minutes as needed.       Marland Kitchen oxyCODONE-acetaminophen (PERCOCET/ROXICET) 5-325 MG per tablet Take 1-2 tablets by mouth every 4 (four) hours as needed for severe pain.  20 tablet  0  . spironolactone (ALDACTONE) 25 MG tablet Take 1 tablet (25 mg total) by mouth daily.  30 tablet  1  . traZODone (DESYREL) 100 MG tablet Take 100 mg by mouth at bedtime as needed.       . zolpidem (AMBIEN) 10 MG tablet Take 1 tablet (10 mg total) by mouth at bedtime as needed for sleep.  30 tablet  5   No current facility-administered medications for this visit.     Allergies:   Statins   Social History:  The patient  reports that she quit smoking about 4 years ago. She does not have any smokeless tobacco history on file. She reports that she does not drink alcohol.   Family History:  The  patient's family history includes Coronary artery disease in her other; Hypertension in her other.   ROS:  Please see the history of present illness.   ***   All other systems reviewed and negative.    PHYSICAL EXAM: VS:  There were no vitals taken for this visit. Well nourished, well developed, in no acute distress HEENT: normal Neck: ***no JVD Cardiac:  normal S1, S2; ***RRR; no murmur Lungs:  ***clear to auscultation bilaterally, no wheezing, rhonchi or rales Abd: soft, nontender, no hepatomegaly Ext: ***no edema Skin: warm and dry Neuro:  CNs 2-12 intact, no focal abnormalities noted  EKG:  ***      ASSESSMENT AND PLAN:  No diagnosis found.   Disposition:  *** FU with ***   Signed, Tereso Newcomer, PA-C, MHS 12/12/2013 2:18 PM    [facility] [address] [place], [place], [place]  [address] Phone: [phone]; Fax: [phone]

## 2013-12-12 NOTE — Progress Notes (Signed)
This encounter was created in error - please disregard.

## 2013-12-20 ENCOUNTER — Other Ambulatory Visit: Payer: Self-pay | Admitting: Internal Medicine

## 2013-12-21 ENCOUNTER — Encounter (HOSPITAL_COMMUNITY): Payer: Self-pay | Admitting: Emergency Medicine

## 2013-12-21 ENCOUNTER — Emergency Department (HOSPITAL_COMMUNITY): Payer: BC Managed Care – PPO

## 2013-12-21 ENCOUNTER — Emergency Department (HOSPITAL_COMMUNITY)
Admission: EM | Admit: 2013-12-21 | Discharge: 2013-12-22 | Disposition: A | Discharge status: Home / Self Care | Payer: BC Managed Care – PPO | Attending: Emergency Medicine | Admitting: Emergency Medicine

## 2013-12-21 DIAGNOSIS — Z7902 Long term (current) use of antithrombotics/antiplatelets: Secondary | ICD-10-CM | POA: Insufficient documentation

## 2013-12-21 DIAGNOSIS — R0789 Other chest pain: Secondary | ICD-10-CM | POA: Diagnosis not present

## 2013-12-21 DIAGNOSIS — I10 Essential (primary) hypertension: Secondary | ICD-10-CM | POA: Insufficient documentation

## 2013-12-21 DIAGNOSIS — R079 Chest pain, unspecified: Secondary | ICD-10-CM | POA: Diagnosis present

## 2013-12-21 DIAGNOSIS — Z79899 Other long term (current) drug therapy: Secondary | ICD-10-CM | POA: Diagnosis not present

## 2013-12-21 DIAGNOSIS — Z7982 Long term (current) use of aspirin: Secondary | ICD-10-CM | POA: Diagnosis not present

## 2013-12-21 DIAGNOSIS — E039 Hypothyroidism, unspecified: Secondary | ICD-10-CM | POA: Diagnosis not present

## 2013-12-21 DIAGNOSIS — I2510 Atherosclerotic heart disease of native coronary artery without angina pectoris: Secondary | ICD-10-CM | POA: Insufficient documentation

## 2013-12-21 DIAGNOSIS — Z87891 Personal history of nicotine dependence: Secondary | ICD-10-CM | POA: Diagnosis not present

## 2013-12-21 LAB — BASIC METABOLIC PANEL WITH GFR
Anion gap: 13 (ref 5–15)
BUN: 20 mg/dL (ref 6–23)
CO2: 23 meq/L (ref 19–32)
Calcium: 9 mg/dL (ref 8.4–10.5)
Chloride: 103 meq/L (ref 96–112)
Creatinine, Ser: 0.9 mg/dL (ref 0.50–1.10)
GFR calc Af Amer: 79 mL/min — ABNORMAL LOW
GFR calc non Af Amer: 68 mL/min — ABNORMAL LOW
Glucose, Bld: 84 mg/dL (ref 70–99)
Potassium: 3.9 meq/L (ref 3.7–5.3)
Sodium: 139 meq/L (ref 137–147)

## 2013-12-21 LAB — CBC WITH DIFFERENTIAL/PLATELET
Basophils Absolute: 0 K/uL (ref 0.0–0.1)
Basophils Relative: 0 % (ref 0–1)
Eosinophils Absolute: 0.2 K/uL (ref 0.0–0.7)
Eosinophils Relative: 3 % (ref 0–5)
HCT: 39.4 % (ref 36.0–46.0)
Hemoglobin: 13.4 g/dL (ref 12.0–15.0)
Lymphocytes Relative: 35 % (ref 12–46)
Lymphs Abs: 2.4 K/uL (ref 0.7–4.0)
MCH: 30.8 pg (ref 26.0–34.0)
MCHC: 34 g/dL (ref 30.0–36.0)
MCV: 90.6 fL (ref 78.0–100.0)
Monocytes Absolute: 0.4 K/uL (ref 0.1–1.0)
Monocytes Relative: 5 % (ref 3–12)
Neutro Abs: 3.9 K/uL (ref 1.7–7.7)
Neutrophils Relative %: 57 % (ref 43–77)
Platelets: 221 K/uL (ref 150–400)
RBC: 4.35 MIL/uL (ref 3.87–5.11)
RDW: 14.2 % (ref 11.5–15.5)
WBC: 6.9 K/uL (ref 4.0–10.5)

## 2013-12-21 LAB — I-STAT TROPONIN, ED: Troponin i, poc: 0 ng/mL (ref 0.00–0.08)

## 2013-12-21 MED ORDER — HYDROCHLOROTHIAZIDE 12.5 MG PO TABS: 25.0000 mg | ORAL_TABLET | Freq: Every day | ORAL | Status: DC

## 2013-12-21 NOTE — ED Notes (Signed)
Pt reports cough for past few months. Green mucus. Sts sharp and throbbing chest pain that started a week ago. Hx MI 7844yrs ago. Sob that is unchanged since the cough started. Denies diaphoresis, n/v. +lightheadedness.

## 2013-12-21 NOTE — Discharge Instructions (Signed)
Chest Pain (Nonspecific) °It is often hard to give a specific diagnosis for the cause of chest pain. There is always a chance that your pain could be related to something serious, such as a heart attack or a blood clot in the lungs. You need to follow up with your health care provider for further evaluation. °CAUSES  °· Heartburn. °· Pneumonia or bronchitis. °· Anxiety or stress. °· Inflammation around your heart (pericarditis) or lung (pleuritis or pleurisy). °· A blood clot in the lung. °· A collapsed lung (pneumothorax). It can develop suddenly on its own (spontaneous pneumothorax) or from trauma to the chest. °· Shingles infection (herpes zoster virus). °The chest wall is composed of bones, muscles, and cartilage. Any of these can be the source of the pain. °· The bones can be bruised by injury. °· The muscles or cartilage can be strained by coughing or overwork. °· The cartilage can be affected by inflammation and become sore (costochondritis). °DIAGNOSIS  °Lab tests or other studies may be needed to find the cause of your pain. Your health care provider may have you take a test called an ambulatory electrocardiogram (ECG). An ECG records your heartbeat patterns over a 24-hour period. You may also have other tests, such as: °· Transthoracic echocardiogram (TTE). During echocardiography, sound waves are used to evaluate how blood flows through your heart. °· Transesophageal echocardiogram (TEE). °· Cardiac monitoring. This allows your health care provider to monitor your heart rate and rhythm in real time. °· Holter monitor. This is a portable device that records your heartbeat and can help diagnose heart arrhythmias. It allows your health care provider to track your heart activity for several days, if needed. °· Stress tests by exercise or by giving medicine that makes the heart beat faster. °TREATMENT  °· Treatment depends on what may be causing your chest pain. Treatment may include: °¨ Acid blockers for  heartburn. °¨ Anti-inflammatory medicine. °¨ Pain medicine for inflammatory conditions. °¨ Antibiotics if an infection is present. °· You may be advised to change lifestyle habits. This includes stopping smoking and avoiding alcohol, caffeine, and chocolate. °· You may be advised to keep your head raised (elevated) when sleeping. This reduces the chance of acid going backward from your stomach into your esophagus. °Most of the time, nonspecific chest pain will improve within 2-3 days with rest and mild pain medicine.  °HOME CARE INSTRUCTIONS  °· If antibiotics were prescribed, take them as directed. Finish them even if you start to feel better. °· For the next few days, avoid physical activities that bring on chest pain. Continue physical activities as directed. °· Do not use any tobacco products, including cigarettes, chewing tobacco, or electronic cigarettes. °· Avoid drinking alcohol. °· Only take medicine as directed by your health care provider. °· Follow your health care provider's suggestions for further testing if your chest pain does not go away. °· Keep any follow-up appointments you made. If you do not go to an appointment, you could develop lasting (chronic) problems with pain. If there is any problem keeping an appointment, call to reschedule. °SEEK MEDICAL CARE IF:  °· Your chest pain does not go away, even after treatment. °· You have a rash with blisters on your chest. °· You have a fever. °SEEK IMMEDIATE MEDICAL CARE IF:  °· You have increased chest pain or pain that spreads to your arm, neck, jaw, back, or abdomen. °· You have shortness of breath. °· You have an increasing cough, or you cough   up blood.  You have severe back or abdominal pain.  You feel nauseous or vomit.  You have severe weakness.  You faint.  You have chills. This is an emergency. Do not wait to see if the pain will go away. Get medical help at once. Call your local emergency services (911 in [place] Do not drive  yourself to the hospital. MAKE SURE YOU:   Understand these instructions.  Will watch your condition.  Will get help right away if you are not doing well or get worse. Document Released: 12/11/2004 Document Revised: 03/08/2013 Document Reviewed: 10/07/2007 [facility] Patient Information 2015 [employer], [place]. This information is not intended to replace advice given to you by your health care provider. Make sure you discuss any questions you have with your health care provider.   Please follow up with your PCP and/or cardiologist tomorrow for further evaluation and management of your chest discomfort. If the pain becomes more frequent, you become Nauseas/Vomit or feel dizzy, please return to ED for further evaluation.

## 2013-12-21 NOTE — ED Provider Notes (Signed)
CSN: [id number]     Arrival date & time 12/21/13  1828 History   First MD Initiated Contact with Patient 12/21/13 2233     Chief Complaint  Patient presents with  . Cough  . Chest Pain     (Consider location/radiation/quality/duration/timing/severity/associated sxs/prior Treatment) HPI Caitlyn [name] is a 63 y.o. female with previous cardiac hx who complains of chest pain and a cough for the past 2 weeks. Patient states she has had a constant throbbing in her chest that was very mild, but sometimes has a sharp component, but is unrelated to movement or exertion. She reports her chest hurts more when she coughs. She reports having had a cough for the past 2 weeks. No other aggravating or relieving factors  Past Medical History  Diagnosis Date  . HYPOTHYROIDISM     post ablation tx Graves  . HYPERLIPIDEMIA   . HYPERTENSION   . CAD 06/2009    s/p MI 4/11: tx with DES x 2 to RCA;  LHC 03/17/11: LAD 30-40%, distal LAD 30-40%, OM2 40-50%, RCA stent patent, EF greater than 70%.  . CAROTID STENOSIS     carotid Dopplers 03/25/11: RICA 60-79%; LICA 0-39%.  Follow up recommended in 6 months.  . PVD   . VITAMIN D DEFICIENCY     DEXA 04/2009 normal  . TOBACCO ABUSE quit 06/2009   Past Surgical History  Procedure Laterality Date  . Carotid stent      bilateral carotid artery disease post stent of left carotid  . Radioactive plaque insertion      Graves disease post radioactive treatment complicated hypothyroidism   Family History  Problem Relation Age of Onset  . Coronary artery disease Other   . Hypertension Other    History  Substance Use Topics  . Smoking status: Former Smoker    Quit date: 06/30/2009  . 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  Divorced, single, lives alone- 2 grown dtr nearby, 3 g-kds  . Alcohol Use: No   OB History   Grav Para Term Preterm Abortions TAB SAB Ect Mult Living                 Review of Systems  Constitutional: Negative for fever.  HENT: Negative for sore throat.   Eyes: Negative for visual disturbance.  Respiratory: Positive for cough. Negative for shortness of breath.   Cardiovascular: Positive for chest pain.  Gastrointestinal: Negative for abdominal pain.  Endocrine: Negative for polyuria.  Genitourinary: Negative for dysuria.  Skin: Negative for rash.  Neurological: Negative for headaches.      Allergies  Statins  Home Medications   Prior to Admission medications   Medication Sig Start Date End Date Taking? Authorizing Provider  albuterol (PROVENTIL HFA;VENTOLIN HFA) 108 (90 BASE) MCG/ACT inhaler Inhale 1 puff into the lungs every 6 (six) hours as needed for wheezing or shortness of breath.   Yes Historical Provider, MD  allopurinol (ZYLOPRIM) 100 MG tablet Take 1 tablet (100 mg total) by mouth daily. 03/20/13  Yes Corwin Levins, MD  aspirin 81 MG tablet Take 81 mg by mouth as needed for pain.   Yes Historical Provider, MD  clopidogrel (PLAVIX) 75 MG tablet Take 1 tablet (75 mg total) by mouth daily. 11/11/11  Yes Romero Belling, MD  colchicine (COLCRYS) 0.6 MG tablet 0.6 mg daily as needed (for flare ups).  07/19/12  Yes Newt Lukes, MD  fluticasone (FLONASE) 50 MCG/ACT nasal spray Place 2 sprays into both nostrils as needed for allergies or rhinitis.    Yes Historical Provider, MD  levothyroxine (SYNTHROID, LEVOTHROID) 112 MCG tablet Take 112 mcg by mouth daily before breakfast.   Yes Historical Provider, MD  lisinopril (PRINIVIL,ZESTRIL) 40 MG tablet Take 1 tablet (40 mg total) by mouth daily. 03/16/13  Yes Aleksei Plotnikov V, MD  metoprolol tartrate (LOPRESSOR) 25 MG tablet Take 25 mg by mouth 2 (two) times daily.   Yes Historical Provider, MD  nitroGLYCERIN (NITROSTAT) 0.4 MG SL tablet Place 0.4 mg under the tongue every 5 (five) minutes as needed for chest pain.    Yes Historical Provider, MD  spironolactone (ALDACTONE) 25 MG tablet Take 1  tablet (25 mg total) by mouth daily. 10/19/13  Yes Pecola LawlessWilliam F Hopper, MD  zolpidem (AMBIEN) 10 MG tablet Take 1 tablet (10 mg total) by mouth at bedtime as needed for sleep.   Yes Newt LukesValerie A Leschber, MD   BP 149/76  Pulse 64  Temp(Src) 98.5 F (36.9 C) (Oral)  Resp 17  SpO2 98% Physical Exam  Nursing note and vitals reviewed. Constitutional: She is oriented to person, place, and time. She appears well-developed and well-nourished.  HENT:  Head: Normocephalic and atraumatic.  Mouth/Throat: Oropharynx is clear and moist.  Eyes: Conjunctivae are normal. Pupils are equal, round, and reactive to light. Right eye exhibits no discharge. Left eye exhibits no discharge. No scleral icterus.  Neck: Neck supple.  Cardiovascular: Normal rate, regular rhythm and normal heart sounds.   Pulmonary/Chest: Effort normal and breath sounds normal. No respiratory distress. She has no wheezes. She has no rales.  Abdominal: Soft. There is no tenderness.  Musculoskeletal: She exhibits no edema and no tenderness.  Neurological: She is alert and oriented to person, place, and time.  Cranial Nerves II-XII grossly intact. No focal neurodeficits  Skin: Skin is warm and dry. No rash noted.  Psychiatric: She has a normal mood and affect.    ED Course  Procedures (including critical care time) Labs Review Labs Reviewed  BASIC METABOLIC PANEL - Abnormal; Notable for the following:    GFR calc non Af Amer 68 (*)    GFR calc Af Amer 79 (*)    All other components within normal limits  CBC WITH DIFFERENTIAL  I-STAT TROPOININ, ED    Imaging Review Dg Chest 2 View  12/21/2013   CLINICAL DATA:  Two week history of cough and chest pain  EXAM: CHEST  2 VIEW  COMPARISON:  February 19, 2013  FINDINGS: There is a 4 mm questionable granuloma in the right upper lobe not appreciated previously. There is no edema or consolidation. The heart is upper normal in size with pulmonary vascularity within normal limits. No  pneumothorax. No adenopathy. No bone lesions.  IMPRESSION: No edema or consolidation. Probable small granuloma right upper lobe not seen previously. Given an apparent change compared to prior studies, a followup chest radiograph in 3 months to assess for stability would be advised.   Electronically Signed   By: Bretta BangWilliam  Woodruff M.D.   On: 12/21/2013 20:11     EKG Interpretation None      MDM  Vitals stable - WNL -afebrile Pt resting comfortably in ED. Says she is ready to go home and doesn't want to talk to anyone else. Encouraged f/u with her PCP and/or Cardiologist for further evaluation of chest discomfort. Also for management of CXR findings of granulation tissue. PE not concerning for  other acute or emergent pathology. Patient does not appear fluid overloaded. Patient is speaking in full sentences, is not short of breath and reports that she feels fine. Labwork noncontributory. Troponin negative. EKG not concerning. Imaging--chest x-ray shows potential 4 millimeter granuloma in right upper lobe. No evidence of edema or consolidation or other acute cardiopulmonary pathologies The patient stable, in good condition and is appropriate for discharge  Discussed f/u with PCP and return precautions, pt very amenable to plan.  Prior to patient discharge, I discussed and reviewed this case with Dr. Romeo Apple     Final diagnoses:  Other chest pain        Sharlene Motts, PA-C 12/22/13 1059

## 2013-12-22 NOTE — ED Provider Notes (Signed)
Medical screening examination/treatment/procedure(s) were conducted as a shared visit with non-physician practitioner(s) and myself.  I personally evaluated the patient during the encounter.   EKG Interpretation   Date/Time:  Wednesday December 21 2013 18:45:40 EDT Ventricular Rate:  55 PR Interval:  184 QRS Duration: 90 QT Interval:  443 QTC Calculation: 424 R Axis:   44 Text Interpretation:  Sinus rhythm Probable left atrial enlargement  Borderline T abnormalities, diffuse leads non-spec t wave changes now seen  in III and aVF, otherwise no significant change from previous Confirmed by  Aleaha Fickling  MD, Easton Fetty ([id number]) on 12/21/2013 11:11:06 PM      I interviewed and examined the patient. Lungs are CTAB. Cardiac exam wnl. Abdomen soft.  CP atypical in that it is constant/throbbing and sometimes sharp. She has known CAD. Her workup here is non-contrib. She would like to leave. She does not want to stay in the hospital. I told her that I could not say these sx were not cardiac and that if she leaves she could have worsening sx, undesired consequences, disability, or death. She understands. Given her neg workup I think it is reasonable to have her f/u closely w/ her cardiologist tomorrow.. She understands. She is to return for any worsening.   Purvis SheffieldForrest Jacquelina Hewins, MD 12/22/13 [phone]

## 2014-01-19 ENCOUNTER — Other Ambulatory Visit: Payer: Self-pay | Admitting: Internal Medicine

## 2014-02-16 ENCOUNTER — Other Ambulatory Visit: Payer: Self-pay

## 2014-02-16 ENCOUNTER — Telehealth: Payer: Self-pay | Admitting: Internal Medicine

## 2014-02-16 DIAGNOSIS — Z1231 Encounter for screening mammogram for malignant neoplasm of breast: Secondary | ICD-10-CM

## 2014-02-16 DIAGNOSIS — I10 Essential (primary) hypertension: Secondary | ICD-10-CM

## 2014-02-16 MED ORDER — SPIRONOLACTONE 25 MG PO TABS: 25.0000 mg | ORAL_TABLET | Freq: Every day | ORAL | Status: DC

## 2014-02-16 NOTE — Telephone Encounter (Signed)
Patient would like to get a refill on a fluid pill Dr. Alwyn RenHopper gave her the last time she was in.  [facility] would [facility].

## 2014-02-16 NOTE — Telephone Encounter (Signed)
Notified pt med sent to [facility]...Raechel Chute/lmb

## 2014-02-22 ENCOUNTER — Encounter (HOSPITAL_COMMUNITY): Payer: Self-pay | Admitting: Cardiology

## 2014-02-24 ENCOUNTER — Encounter: Payer: Self-pay | Admitting: Internal Medicine

## 2014-02-24 ENCOUNTER — Other Ambulatory Visit (INDEPENDENT_AMBULATORY_CARE_PROVIDER_SITE_OTHER): Payer: BC Managed Care – PPO

## 2014-02-24 ENCOUNTER — Ambulatory Visit (INDEPENDENT_AMBULATORY_CARE_PROVIDER_SITE_OTHER): Payer: BC Managed Care – PPO | Admitting: Internal Medicine

## 2014-02-24 VITALS — BP 128/82 | HR 57 | Temp 98.2°F | Ht 65.5 in | Wt 259.0 lb

## 2014-02-24 DIAGNOSIS — M609 Myositis, unspecified: Secondary | ICD-10-CM

## 2014-02-24 DIAGNOSIS — M2550 Pain in unspecified joint: Secondary | ICD-10-CM

## 2014-02-24 DIAGNOSIS — R0789 Other chest pain: Secondary | ICD-10-CM

## 2014-02-24 DIAGNOSIS — Z8739 Personal history of other diseases of the musculoskeletal system and connective tissue: Secondary | ICD-10-CM

## 2014-02-24 DIAGNOSIS — M791 Myalgia: Secondary | ICD-10-CM

## 2014-02-24 DIAGNOSIS — R29890 Loss of height: Secondary | ICD-10-CM

## 2014-02-24 DIAGNOSIS — M6281 Muscle weakness (generalized): Secondary | ICD-10-CM

## 2014-02-24 DIAGNOSIS — Z8639 Personal history of other endocrine, nutritional and metabolic disease: Secondary | ICD-10-CM

## 2014-02-24 DIAGNOSIS — R5383 Other fatigue: Secondary | ICD-10-CM

## 2014-02-24 DIAGNOSIS — IMO0001 Reserved for inherently not codable concepts without codable children: Secondary | ICD-10-CM

## 2014-02-24 LAB — SEDIMENTATION RATE: SED RATE: 22 mm/h (ref 0–22)

## 2014-02-24 LAB — URIC ACID: Uric Acid, Serum: 6.5 mg/dL (ref 2.4–7.0)

## 2014-02-24 LAB — TSH: TSH: 2.23 u[IU]/mL (ref 0.35–4.50)

## 2014-02-24 LAB — T4, FREE: Free T4: 1.1 ng/dL (ref 0.60–1.60)

## 2014-02-24 LAB — CK: Total CK: 178 U/L — ABNORMAL HIGH (ref 7–177)

## 2014-02-24 NOTE — Patient Instructions (Signed)
Your next office appointment will be determined based upon review of your pending labs . Those instructions will be transmitted to you  by mail Followup as needed for your acute issue. Please report any significant change in your symptoms.Please keep a diary to help define exact symptoms and location.

## 2014-02-24 NOTE — Progress Notes (Signed)
Pre visit review using our clinic review tool, if applicable. No additional management support is needed unless otherwise documented below in the visit note. 

## 2014-02-24 NOTE — Progress Notes (Signed)
Subjective:    Patient ID: Caitlyn [name], female    DOB: [birth year], 63 y.o.   MRN: [id number]  HPI  She is an extremely nice person but incredibly poor historian  It is difficult to pin her down as to whether the bilateral leg pain she is having is in the joints or in the muscles even after delineating a joint from muscle mass..  During the interview the answer changed at least 3 times.  She describes generalized body weakness. She states she can hardly stand  at times.  She has exacerbation of these symptoms simply bending over at the waist  She also does describe some joint stiffness  She has some numbness and tingling in the lower extremities as well.  She goes on to describe substernal chest pain which is sharp. It occurs at rest.She is unsure as to whether she had chest pain last yesterday or 2 days ago.  She has a history of coronary disease and has an appointment with the Cardiology PA 12/17. She has stopped her Plavix.  She had been on statins in the past but was intolerant  She's also concerned that she's losing height from 5 ' 7" down to 5 foot 5-1/2". Her last bone density was in 2011  She was seen in the emergency room 12/21/13 with chest pain. There were some nonspecific changes in the T waves in leads 3 and aVF. There ER doctor. had suggested possible observation admission; but she declined this. She denies this.  In the emergency room CBC and differential , extensive chemistries and Troponin were normal except for GFR 79.  Her last thyroid function test was in August of this year.   She does have a past history of gout    Review of Systems   She denies excessive snoring or apneic spells  She denies any bleeding dyscrasias except for minor epistaxis     Objective:   Physical Exam   Pertinent or positive findings include:  As per CDC Guidelines ,Epic documents severe obesity as being present . She has an upper partial. She has prominent nasal  polyps Breath sounds are decreased She has a grade 1/2 systolic murmur There is  valgus change of the knees She has crepitus in the knees. She has trace edema at the sock line There is a small ganglion at the DIP joint of the third right finger.  General appearance :adequately nourished; in no distress. Eyes: No conjunctival inflammation or scleral icterus is present. Oral exam: Lips and gums are healthy appearing.There is no oropharyngeal erythema or exudate noted.  Heart:  Normal rate and regular rhythm. S1 and S2 normal without gallop, click, rub or other extra sounds   Lungs:Chest clear to auscultation; no wheezes, rhonchi,rales ,or rubs present.No increased work of breathing.  Abdomen: bowel sounds normal, soft and non-tender without masses, organomegaly or hernias noted.  No guarding or rebound. No flank tenderness to percussion. Vascular : all pulses equal ; no bruits present. Skin:Warm & dry.  Intact without suspicious lesions or rashes ; no tenting Lymphatic: No lymphadenopathy is noted about the head, neck, axilla             Assessment & Plan:#1 myalgias versus arthralgias; history is unclear  #2 atypical chest pain  #3 diffuse weakness and fatigue  #4 history gout #5 loss of height    Plan: See orders and recommendations  Note:I shall alert Tereso NewcomerScott Weaver to assess any improvement in providing a history after keeping  a diary of events between now & that appointment.If this pattern  Persists ; memory deficit issues must be pursued.

## 2014-02-28 LAB — CYCLIC CITRUL PEPTIDE ANTIBODY, IGG: CYCLIC CITRULLIN PEPTIDE AB: 4.1 U/mL (ref 0.0–5.0)

## 2014-03-01 ENCOUNTER — Other Ambulatory Visit: Payer: Self-pay

## 2014-03-02 ENCOUNTER — Encounter: Payer: Self-pay | Admitting: Physician Assistant

## 2014-03-02 ENCOUNTER — Ambulatory Visit (INDEPENDENT_AMBULATORY_CARE_PROVIDER_SITE_OTHER): Payer: BC Managed Care – PPO | Admitting: Physician Assistant

## 2014-03-02 VITALS — BP 137/80 | HR 43 | Ht 65.5 in | Wt 259.0 lb

## 2014-03-02 DIAGNOSIS — I2510 Atherosclerotic heart disease of native coronary artery without angina pectoris: Secondary | ICD-10-CM

## 2014-03-02 DIAGNOSIS — R0789 Other chest pain: Secondary | ICD-10-CM

## 2014-03-02 DIAGNOSIS — E785 Hyperlipidemia, unspecified: Secondary | ICD-10-CM

## 2014-03-02 DIAGNOSIS — I10 Essential (primary) hypertension: Secondary | ICD-10-CM

## 2014-03-02 DIAGNOSIS — I6523 Occlusion and stenosis of bilateral carotid arteries: Secondary | ICD-10-CM

## 2014-03-02 DIAGNOSIS — R001 Bradycardia, unspecified: Secondary | ICD-10-CM

## 2014-03-02 DIAGNOSIS — R0683 Snoring: Secondary | ICD-10-CM

## 2014-03-02 DIAGNOSIS — R5383 Other fatigue: Secondary | ICD-10-CM

## 2014-03-02 MED ORDER — CLOPIDOGREL BISULFATE 75 MG PO TABS: 75.0000 mg | ORAL_TABLET | Freq: Every day | ORAL | Status: DC

## 2014-03-02 NOTE — Progress Notes (Signed)
Cardiology Office Note   Date:  03/02/2014   ID:  Caitlyn [name], DOB 1950/11/06, MRN [id number]  PCP:  Rene PaciValerie Leschber, MD  Cardiologist:  Dr. Verne Carrowhristopher McAlhany     History of Present Illness: Caitlyn [name] is a 63 y.o. female with a hx of CAD s/p NSTEMI 06/2009 treated with 2 Promus DES to the RCA, HL, HTN, hypothyroidism, carotid stenosis, diastolic HF.  Coreg was stopped in 2014 due to fatigue.  Last seen by Dr. Verne Carrowhristopher McAlhany 06/2013.  She was seen in the ED 12/2013 with chest pain.  She then FU with her PCP.  She returns for FU. There was some discrepancy in her history and Dr. Alwyn RenHopper asked her to diary her symptoms before coming here today. She tells me that she has been fatigued for a couple of mos.  She denies syncope or near syncope. She does note chest pain.  She describes this as a nagging pain in the left chest pain.  She denies any radiation or associated dyspnea, nausea or diaphoresis.  She notes it when she is active and when she is at rest.  She has not tried NTG.  She denies significant dyspnea, orthopnea, PND, edema.     Studies:   - LHC (4/11):  Mid LAD 60-70%, superior OM br 30%, inferior OM br 60-70%, pRCA 30% then 90% with ruptured plaque, dist RCA 30%, preserved LVF >> PCI: Promus DES x 2 to mid RCA  - LHC 03/17/11: LAD 30-40%, distal LAD 30-40%, OM2 40-50%, RCA stent patent, EF greater than 70%.  - Echo (4/11):  EF 55-60%, no RWMA  - Nuclear (6/14): Lexiscan - No ischemia, EF 76%, Normal study  - Carotid US (9/15):  R 60-79%, L 40-59%, occl L vertebral >> FU 6 mos   Recent Labs: 12/21/2013: BUN 20; Creatinine 0.90; Hemoglobin 13.4; Potassium 3.9; Sodium 139 02/24/2014: TSH 2.23  12/21/2013: Troponin i, poc 0.00    Recent Radiology: No results found.    Wt Readings from Last 3 Encounters:  03/02/14 259 lb (117.482 kg)  02/24/14 259 lb (117.482 kg)  10/19/13 258 lb 2 oz (117.085 kg)     Past Medical History  Diagnosis Date  .  HYPOTHYROIDISM     post ablation tx Graves  . HYPERLIPIDEMIA   . HYPERTENSION   . CAD 06/2009    s/p MI 4/11: tx with DES x 2 to RCA;  LHC 03/17/11: LAD 30-40%, distal LAD 30-40%, OM2 40-50%, RCA stent patent, EF greater than 70%.  . CAROTID STENOSIS     carotid Dopplers 03/25/11: RICA 60-79%; LICA 0-39%.  Follow up recommended in 6 months.  . PVD   . VITAMIN D DEFICIENCY     DEXA 04/2009 normal  . TOBACCO ABUSE quit 06/2009    Current Outpatient Prescriptions  Medication Sig Dispense Refill  . albuterol (PROVENTIL HFA;VENTOLIN HFA) 108 (90 BASE) MCG/ACT inhaler Inhale 1 puff into the lungs every 6 (six) hours as needed for wheezing or shortness of breath.    . allopurinol (ZYLOPRIM) 100 MG tablet Take 1 tablet (100 mg total) by mouth daily. 90 tablet 3  . aspirin 81 MG tablet Take 81 mg by mouth as needed for pain.    Marland Kitchen. clopidogrel (PLAVIX) 75 MG tablet Take 1 tablet (75 mg total) by mouth daily. 90 tablet 6  . colchicine (COLCRYS) 0.6 MG tablet 0.6 mg daily as needed (for flare ups).     . fluticasone (FLONASE) 50 MCG/ACT nasal spray Place  2 sprays into both nostrils as needed for allergies or rhinitis.     Marland Kitchen levothyroxine (SYNTHROID, LEVOTHROID) 112 MCG tablet Take 112 mcg by mouth daily before breakfast.    . lisinopril (PRINIVIL,ZESTRIL) 40 MG tablet TAKE 1 TABLET BY MOUTH DAILY 30 tablet 1  . metoprolol tartrate (LOPRESSOR) 25 MG tablet Take 25 mg by mouth 2 (two) times daily.    . nitroGLYCERIN (NITROSTAT) 0.4 MG SL tablet Place 0.4 mg under the tongue every 5 (five) minutes as needed for chest pain.     Marland Kitchen spironolactone (ALDACTONE) 25 MG tablet Take 1 tablet (25 mg total) by mouth daily. 30 tablet 3  . zolpidem (AMBIEN) 10 MG tablet Take 1 tablet (10 mg total) by mouth at bedtime as needed for sleep. 30 tablet 5   No current facility-administered medications for this visit.     Allergies:   Statins   Social History:  The patient  reports that she quit smoking about 4 years ago.  She does not have any smokeless tobacco history on file. She reports that she does not drink alcohol.   Family History:  The patient's family history includes Cancer in her father and mother; Coronary artery disease in her other; Heart attack in her mother; Hypertension in her other and sister; Stroke in her mother.    ROS:  Please see the history of present illness.   She does snore.  She denies any hx of witnessed apneic episodes.  She has insomnia.   All other systems reviewed and negative.    PHYSICAL EXAM: VS:  BP 137/80 mmHg  Pulse 43  Ht 5' 5.5" (1.664 m)  Wt 259 lb (117.482 kg)  BMI 42.43 kg/m2 Well nourished, well developed, in no acute distress HEENT: normal Neck:  no JVD Cardiac:  normal S1, S2;  RRR; no murmur   Lungs:  clear to auscultation bilaterally, no wheezing, rhonchi or rales Abd: soft, nontender, no hepatomegaly Ext:  no edema Skin: warm and dry Neuro:  CNs 2-12 intact, no focal abnormalities noted  EKG:  Sinus brady, HR 43, normal axis, PRWP, NSSTTW changes      ASSESSMENT AND PLAN: No diagnosis found.  1.  Chest Pain:  Atypical symptoms.  However, it has been over a year since her last assessment for ischemia.  She has significant bradycardia as well.      -  DC Metoprolol Tartrate as outlined below.     -  Arrange ETT-Myoview to rule out ischemia. 2.  Bradycardia:  I suspect some of her fatigue may be explained by this.  She tells me that the Metoprolol Tartrate was started about 2 mos ago.  She has felt fatigued about 2 mos.  She was previously taken off of Coreg b/c of fatigue.      -  Take Metoprolol Tartrate 12.5 mg x 3 doses, then DC.    -  Arrange ETT-Myoview.  This will allow to assess chronotropic competence.   3.  Hypertension:  Controlled.  I will bring her back for a BP check prior to her stress test.  If her BP is running high, I will place her on Amlodipine.  She has been on that medication in the past. This was stopped for unclear reasons.     -  Check BP in 1 week with RN.    -  Check BMET today (on Spironolactone). 4.  Fatigue:  She does snore.  Bradycardia, especially nocturnal bradycardia can be exacerbated by OSA.      -  Will need to consider setting up sleep study over time.   5.  Coronary Artery Disease:  As noted, she is having chest pain.  Myoview will be obtained.  She is out of Plavix.  This will be refilled.  She has not had any bleeding problems.  She will remain on ASA.  She is intolerant to statins. 6.  Carotid Artery Stenosis:  FU duplex planned in 05/2014. 7.  Chronic Diastolic CHF:  Volume appears stable.  She is on Spironolactone. 8.  Hyperlipidemia:  She is intolerant of statins.    Disposition:   FU with me or Dr. Verne Carrowhristopher McAlhany 2-3 weeks.   Signed, Brynda RimScott Satina Jerrell, PA-C, MHS 03/02/2014 3:52 PM    [facility] [address], [place], [place]  [address] Phone: [phone](336) [phone]; Fax: [phone]) [phone]

## 2014-03-02 NOTE — Patient Instructions (Addendum)
DECREASE METOPROLOL TO 12.5 MG TONIGHT, 12/18 AM, 12/18 PM THEN STOP  LAB WORK TODAY; BMET  Your physician has requested that you have en exercise stress myoview THIS IS TO BE DONE SOME TIME AFTER BP CHECK ON 03/13/14. For further information please visit [email or web address]. Please follow instruction sheet, as given.  YOU HAVE BEEN SCHEDULED FOR A BLOOD PRESSURE CHECK WITH THE NURSE ON 03/13/14 10 AM   FOLLOW UP IN 2-3 WEEKS WITH DR. Clifton JamesMCALHANY OR SCOTT WEAVER, PAC SAME DAY DR. Clifton JamesMCALHANY IS IN THE OFFICE

## 2014-03-03 ENCOUNTER — Ambulatory Visit (INDEPENDENT_AMBULATORY_CARE_PROVIDER_SITE_OTHER)
Admission: RE | Admit: 2014-03-03 | Discharge: 2014-03-03 | Disposition: A | Discharge status: Home / Self Care | Payer: BC Managed Care – PPO | Source: Ambulatory Visit | Attending: Internal Medicine | Admitting: Internal Medicine

## 2014-03-03 ENCOUNTER — Telehealth: Payer: Self-pay | Admitting: Internal Medicine

## 2014-03-03 DIAGNOSIS — R29890 Loss of height: Secondary | ICD-10-CM

## 2014-03-03 LAB — BASIC METABOLIC PANEL
BUN: 18 mg/dL (ref 6–23)
CHLORIDE: 107 meq/L (ref 96–112)
CO2: 26 mEq/L (ref 19–32)
Calcium: 9.1 mg/dL (ref 8.4–10.5)
Creatinine, Ser: 1 mg/dL (ref 0.4–1.2)
GFR: 73.32 mL/min (ref >60.00)
Glucose, Bld: 81 mg/dL (ref 70–99)
POTASSIUM: 4.3 meq/L (ref 3.5–5.1)
SODIUM: 140 meq/L (ref 135–145)

## 2014-03-03 NOTE — Telephone Encounter (Signed)
Pt came by office stating that she just had a bone density completed down stairs and would like Dr Caitlyn [name] to review the results as soon as they are completed. Pt would also like to be contacted about her results.

## 2014-03-06 ENCOUNTER — Telehealth: Payer: Self-pay

## 2014-03-06 NOTE — Telephone Encounter (Signed)
spoke with pt about lab results. Pt verbalized understanding. Pt will continue with current treatment plan. 

## 2014-03-06 NOTE — Telephone Encounter (Signed)
-----   Message from Beatrice LecherScott T Weaver, [place] sent at 03/03/2014  2:34 PM EST ----- Potassium and kidney function ok Continue with current treatment plan. Tereso NewcomerScott Weaver, PA-C   03/03/2014 2:34 PM

## 2014-03-06 NOTE — Telephone Encounter (Signed)
DEXA is normal - no significant bone loss for age. No treatment changes recommended

## 2014-03-13 ENCOUNTER — Ambulatory Visit (INDEPENDENT_AMBULATORY_CARE_PROVIDER_SITE_OTHER): Payer: BC Managed Care – PPO | Admitting: *Deleted

## 2014-03-13 ENCOUNTER — Telehealth: Payer: Self-pay | Admitting: Cardiovascular Disease

## 2014-03-13 VITALS — BP 172/90 | HR 70 | Wt 261.0 lb

## 2014-03-13 DIAGNOSIS — I10 Essential (primary) hypertension: Secondary | ICD-10-CM

## 2014-03-13 MED ORDER — HYDRALAZINE HCL 25 MG PO TABS: 25.0000 mg | ORAL_TABLET | Freq: Three times a day (TID) | ORAL | Status: DC

## 2014-03-13 NOTE — Telephone Encounter (Signed)
Spoke with pt. She reports her answering machine showed calls from our office but no message.  I told pt these were probably from our nuclear medicine department and she should come in as scheduled for stress test tomorrow.

## 2014-03-13 NOTE — Patient Instructions (Signed)
Your physician has recommended you make the following change in your medication:  Start hydralazine 25 mg by mouth three times daily

## 2014-03-13 NOTE — Progress Notes (Signed)
Pt here for blood pressure check. Complains of feeling tired and legs hurting. Feels leg pain may be related to gout. Chest pain earlier this AM. Lasted 5-10 minutes and went away on it's own.  Does not recall when she last had pain prior to this AM.  Stress test is scheduled for tomorrow.  Reports she cannot take amlodipine because it causes swelling. Will add to allergy list. Reviewed with Tereso NewcomerScott Weaver, PA and orders given for pt to start hydralazine 25 mg by mouth three times daily. Pt [facility] and [facility].

## 2014-03-13 NOTE — Telephone Encounter (Signed)
New msg        Pt returning call from today. Please call back.

## 2014-03-14 ENCOUNTER — Telehealth: Payer: Self-pay | Admitting: *Deleted

## 2014-03-14 ENCOUNTER — Ambulatory Visit (HOSPITAL_COMMUNITY): Payer: BC Managed Care – PPO | Attending: Cardiovascular Disease | Admitting: Radiology

## 2014-03-14 DIAGNOSIS — I739 Peripheral vascular disease, unspecified: Secondary | ICD-10-CM | POA: Insufficient documentation

## 2014-03-14 DIAGNOSIS — R0789 Other chest pain: Secondary | ICD-10-CM

## 2014-03-14 DIAGNOSIS — I2510 Atherosclerotic heart disease of native coronary artery without angina pectoris: Secondary | ICD-10-CM | POA: Diagnosis not present

## 2014-03-14 DIAGNOSIS — I10 Essential (primary) hypertension: Secondary | ICD-10-CM | POA: Diagnosis not present

## 2014-03-14 DIAGNOSIS — R079 Chest pain, unspecified: Secondary | ICD-10-CM | POA: Insufficient documentation

## 2014-03-14 MED ORDER — REGADENOSON 0.4 MG/5ML IV SOLN
0.4000 mg | Freq: Once | INTRAVENOUS | Status: AC
  Administered 2014-03-14: 0.4 mg via INTRAVENOUS

## 2014-03-14 MED ORDER — TECHNETIUM TC 99M SESTAMIBI GENERIC - CARDIOLITE
33.0000 | Freq: Once | INTRAVENOUS | Status: AC | PRN
  Administered 2014-03-14: 33 via INTRAVENOUS

## 2014-03-14 NOTE — Telephone Encounter (Signed)
Pt here for nuclear study and is requesting a note for work stating she has no restrictions.  I discussed with pt and told her MD would review results of stress test and return to work note would be written based on these results.

## 2014-03-14 NOTE — Progress Notes (Signed)
[facility] SITE 3 NUCLEAR MED [address] [place], [place] [address] [phone]    Cardiology Nuclear Med Study  Caitlyn [name] is a 63 y.o. female     MRN : [id number]     DOB: 1950-09-18  Procedure Date: 03/14/2014  Nuclear Med Background Indication for Stress Test:  Evaluation for Ischemia and Stent Patency History:  CAD, MPI 2012 (normal) EF 76% Cardiac Risk Factors: Carotid Disease, Hypertension and PVD  Symptoms:  Chest Pain and Chest Pain with Exertion    Nuclear Pre-Procedure Caffeine/Decaff Intake:  None> 12 hrs NPO After: 7:30pm   Lungs:  clear O2 Sat: 97% on room air. IV 0.9% NS with Angio Cath:  22g  IV Site: R Antecubital x 1, tolerated well IV Started by:  Irean HongPatsy Edwards, RN  Chest Size (in):  44 Cup Size: D  Height: 5\' 5"  (1.651 m)  Weight:  259 lb (117.482 kg)  BMI:  Body mass index is 43.1 kg/(m^2). Tech Comments:  Patient took Lisinopril and Aldactone this am. Irean HongPatsy Edwards, RN.    Nuclear Med Study 1 or 2 day study: 2 day  Stress Test Type:  Lexiscan  Reading MD: N/A  Order Authorizing Provider:  Tereso NewcomerScott Weaver, PAC, and Verne Carrowhristopher McAlhany, MD  Resting Radionuclide: Technetium 6044m Sestamibi  Resting Radionuclide Dose: 33.0 mCi  On      03-15-14  Stress Radionuclide:  Technetium 7644m Sestamibi  Stress Radionuclide Dose: 33.0 mCi  On         03-14-14          Stress Protocol Rest HR: 61 Stress HR: 88  Rest BP: 201/90 Stress BP: 185/89  Exercise Time (min): n/a METS: n/a           Dose of Adenosine (mg):  n/a Dose of Lexiscan: 0.4 mg  Dose of Atropine (mg): n/a Dose of Dobutamine: n/a mcg/kg/min (at max HR)  Stress Test Technologist: Nelson ChimesSharon Brooks, BS-ES  Nuclear Technologist:  Harlow AsaElizabeth Young, CNMT     Rest Procedure:  Myocardial perfusion imaging was performed at rest 45 minutes following the intravenous administration of Technetium 1544m Sestamibi. Rest ECG: NSR with non-specific ST-T wave changes  Stress Procedure:  The  patient received IV Lexiscan 0.4 mg over 15-seconds.  Technetium 7344m Sestamibi injected at 30-seconds.  Quantitative spect images were obtained after a 45 minute delay.  During the infusion of Lexiscan the patient complained of SOB, weakness, anxiousness and a headache.  These symptoms resolved in recovery. Stress ECG: No significant change from baseline ECG  QPS Raw Data Images:  Normal; no motion artifact; normal heart/lung ratio. Stress Images:  There is mild apical thinning with normal uptake in other regions. Rest Images:  There is mild apical thinning with normal uptake in other regions. Subtraction (SDS):  No evidence of ischemia. Transient Ischemic Dilatation (Normal <1.22):  1.24 Lung/Heart Ratio (Normal <0.45):  0.24  Quantitative Gated Spect Images QGS EDV:  114 ml QGS ESV:  44 ml  Impression Exercise Capacity:  Lexiscan with no exercise. BP Response:  Normal blood pressure response. Clinical Symptoms:  No chest pain. ECG Impression:  No significant ST segment change suggestive of ischemia. Comparison with Prior Nuclear Study: No significant change from previous study  Overall Impression:  Normal stress nuclear study.  Mild apical thinning. No ischemia.  LV Ejection Fraction: 61%.  LV Wall Motion:  NL LV Function; NL Wall Motion  Cassell Clementhomas Adelaida Reindel MD

## 2014-03-15 ENCOUNTER — Ambulatory Visit (HOSPITAL_COMMUNITY): Payer: BC Managed Care – PPO | Attending: Cardiology

## 2014-03-15 DIAGNOSIS — R0989 Other specified symptoms and signs involving the circulatory and respiratory systems: Secondary | ICD-10-CM

## 2014-03-15 MED ORDER — TECHNETIUM TC 99M SESTAMIBI GENERIC - CARDIOLITE
33.0000 | Freq: Once | INTRAVENOUS | Status: AC | PRN
  Administered 2014-03-15: 33 via INTRAVENOUS

## 2014-03-16 ENCOUNTER — Encounter: Payer: Self-pay | Admitting: *Deleted

## 2014-03-16 ENCOUNTER — Encounter: Payer: Self-pay | Admitting: Physician Assistant

## 2014-03-16 ENCOUNTER — Telehealth: Payer: Self-pay | Admitting: *Deleted

## 2014-03-16 ENCOUNTER — Encounter: Payer: Self-pay | Admitting: Cardiovascular Disease

## 2014-03-16 NOTE — Telephone Encounter (Signed)
Pt notfied of myoview results with verbal understanding. Pt also advised that we will probably walk her for a few minutes to see if HR raises with activity when she sees Dr. Clifton JamesMcAlhany. Ptsaid ok and thank you for the good  news.

## 2014-03-16 NOTE — Telephone Encounter (Signed)
Tried to reach pt. No answer. Will continue to try to reach her

## 2014-03-16 NOTE — Telephone Encounter (Signed)
Spoke with pt and she would like letter sent to [employer]. Fax [phone]. Phone [phone]. I spoke with Selena BattenKim and confirmed fax number. Will fax letter.

## 2014-03-16 NOTE — Telephone Encounter (Signed)
Letter completed in EPIC.

## 2014-03-21 ENCOUNTER — Telehealth: Payer: Self-pay

## 2014-03-21 NOTE — Telephone Encounter (Signed)
Pt called. [employer] work is faxing paperwork that needs to be filled out today. [employer] was faxed this am. No faxes for pt as stated.  Pt is having work fax over to [phone] fax number.

## 2014-03-21 NOTE — Telephone Encounter (Signed)
Spoke to [employer]ra Can[employer] at [employer] (pt work) and informed that fax had been received, however, it was after PCP left.   Spoke to Dr. Dorise HissKollar and Dr. Alwyn RenHopper, both were unsure and unfamiliar with pt to fill out the employer requested form.  Spoke to PCP and will wait until tomorrow to fill out based on the information in most recent office note.

## 2014-03-22 NOTE — Telephone Encounter (Signed)
Faxed the employer requested form.   Called pt employer to let them know that this was completed.  

## 2014-03-22 NOTE — Telephone Encounter (Signed)
Faxed the employer requested form.   Called pt employer to let them know that this was completed.

## 2014-03-23 ENCOUNTER — Ambulatory Visit: Payer: BC Managed Care – PPO | Admitting: Cardiovascular Disease

## 2014-03-28 ENCOUNTER — Ambulatory Visit: Payer: BC Managed Care – PPO | Admitting: Cardiovascular Disease

## 2014-04-27 ENCOUNTER — Other Ambulatory Visit: Payer: Self-pay | Admitting: Internal Medicine

## 2014-05-03 ENCOUNTER — Telehealth: Payer: Self-pay

## 2014-05-03 NOTE — Telephone Encounter (Signed)
PA for colcrys 0.6 mg tab.  Started PA but I did not have the correct Insurance information.  Called pt and pt gave the correct ID, etc needed for PA.   PA started via cover my meds.   PA approved  Pt informed  Pharmacy informed.

## 2014-05-25 ENCOUNTER — Other Ambulatory Visit: Payer: Self-pay | Admitting: Internal Medicine

## 2014-05-30 ENCOUNTER — Telehealth: Payer: Self-pay | Admitting: Internal Medicine

## 2014-05-30 DIAGNOSIS — M79673 Pain in unspecified foot: Secondary | ICD-10-CM

## 2014-05-30 DIAGNOSIS — M1000 Idiopathic gout, unspecified site: Secondary | ICD-10-CM

## 2014-05-30 NOTE — Telephone Encounter (Signed)
Pt called request referral to podiatrist for her gout. Please call pt and let her know if this is ok. ([facility]

## 2014-06-06 ENCOUNTER — Ambulatory Visit (INDEPENDENT_AMBULATORY_CARE_PROVIDER_SITE_OTHER): Payer: 59

## 2014-06-06 ENCOUNTER — Ambulatory Visit (INDEPENDENT_AMBULATORY_CARE_PROVIDER_SITE_OTHER): Payer: 59 | Admitting: Podiatry

## 2014-06-06 ENCOUNTER — Encounter: Payer: Self-pay | Admitting: Podiatry

## 2014-06-06 VITALS — BP 147/79 | HR 61 | Resp 18

## 2014-06-06 DIAGNOSIS — M10079 Idiopathic gout, unspecified ankle and foot: Secondary | ICD-10-CM

## 2014-06-06 DIAGNOSIS — M779 Enthesopathy, unspecified: Secondary | ICD-10-CM

## 2014-06-06 DIAGNOSIS — M2010 Hallux valgus (acquired), unspecified foot: Secondary | ICD-10-CM

## 2014-06-06 DIAGNOSIS — M109 Gout, unspecified: Secondary | ICD-10-CM

## 2014-06-06 DIAGNOSIS — M79671 Pain in right foot: Secondary | ICD-10-CM

## 2014-06-06 DIAGNOSIS — M79672 Pain in left foot: Secondary | ICD-10-CM | POA: Diagnosis not present

## 2014-06-06 MED ORDER — TRIAMCINOLONE ACETONIDE 10 MG/ML IJ SUSP
10.0000 mg | Freq: Once | INTRAMUSCULAR | Status: AC
  Administered 2014-06-06: 10 mg

## 2014-06-06 NOTE — Progress Notes (Signed)
   Subjective:    Patient ID: Caitlyn [name], female    DOB: 1951-01-08, 64 y.o.   MRN: [id number]  HPI I THINK I HAVE GOUT AND THE LEFT IS WORSE AND I HAVE SOME SWELLING AND BURNS AND THROBS AND INFLAMMED AND SORE AND TENDER AND I HAVE USED RUBBING ALCOHOL    Review of Systems  Constitutional: Positive for fatigue.  Cardiovascular:       CALF PAIN WHEN WALKING  All other systems reviewed and are negative.      Objective:   Physical Exam        Assessment & Plan:

## 2014-06-07 NOTE — Progress Notes (Signed)
Subjective:     Patient ID: Caitlyn [name], female   DOB: [birth year], 64 y.o.   MRN: [id number]  HPI patient's found to have discomfort and inflammation around the first metatarsal head left over right with fluid buildup and has trouble with wearing shoe gear or being comfortable. Has history of gout and is currently taking medication   Review of Systems  All other systems reviewed and are negative.      Objective:   Physical Exam  Constitutional: She is oriented to person, place, and time.  Cardiovascular: Intact distal pulses.   Musculoskeletal: Normal range of motion.  Neurological: She is oriented to person, place, and time.  Skin: Skin is warm.  Nursing note and vitals reviewed.  neurovascular status intact with muscle strength adequate and range of motion within normal limits subtalar midtarsal joint. Patient's found to have fluid buildup around the first MPJ left over right with pain and structural deformity noted. 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 Reappoint in 3 weeks to recheck

## 2014-06-13 ENCOUNTER — Ambulatory Visit: Payer: Self-pay | Admitting: Internal Medicine

## 2014-06-20 ENCOUNTER — Ambulatory Visit: Payer: 59 | Admitting: Podiatry

## 2014-07-24 ENCOUNTER — Ambulatory Visit
Admission: RE | Admit: 2014-07-24 | Discharge: 2014-07-24 | Disposition: A | Discharge status: Home / Self Care | Payer: 59 | Source: Ambulatory Visit

## 2014-07-24 DIAGNOSIS — Z1231 Encounter for screening mammogram for malignant neoplasm of breast: Secondary | ICD-10-CM

## 2014-08-18 ENCOUNTER — Other Ambulatory Visit: Payer: Self-pay

## 2014-08-18 DIAGNOSIS — I83813 Varicose veins of bilateral lower extremities with pain: Secondary | ICD-10-CM

## 2014-08-21 ENCOUNTER — Other Ambulatory Visit: Payer: Self-pay | Admitting: Internal Medicine

## 2014-09-28 ENCOUNTER — Encounter: Payer: Self-pay | Admitting: Podiatry

## 2014-09-28 ENCOUNTER — Ambulatory Visit (INDEPENDENT_AMBULATORY_CARE_PROVIDER_SITE_OTHER): Payer: 59 | Admitting: Podiatry

## 2014-09-28 VITALS — BP 146/82 | HR 68 | Resp 12

## 2014-09-28 DIAGNOSIS — M779 Enthesopathy, unspecified: Secondary | ICD-10-CM | POA: Diagnosis not present

## 2014-09-28 DIAGNOSIS — M10079 Idiopathic gout, unspecified ankle and foot: Secondary | ICD-10-CM

## 2014-09-28 DIAGNOSIS — M2010 Hallux valgus (acquired), unspecified foot: Secondary | ICD-10-CM

## 2014-09-28 DIAGNOSIS — M109 Gout, unspecified: Secondary | ICD-10-CM

## 2014-09-28 MED ORDER — TRIAMCINOLONE ACETONIDE 10 MG/ML IJ SUSP
10.0000 mg | Freq: Once | INTRAMUSCULAR | Status: AC
  Administered 2014-09-28: 10 mg

## 2014-09-28 MED ORDER — METHYLPREDNISOLONE 4 MG PO TBPK: ORAL_TABLET | ORAL | Status: DC

## 2014-09-29 NOTE — Progress Notes (Signed)
Subjective:     Patient ID: Caitlyn [name], female   DOB: Aug 20, 1950, 64 y.o.   MRN: [id number]  HPI patient presents stating she was doing fine and then started to develop redness and inflammation in the first metatarsal phalangeal joint right foot again. States she's been taking her allopurinol   Review of Systems     Objective:   Physical Exam Neurovascular status intact muscle strength adequate with inflammation and redness around the first MPJ right which is very tender when pressed. Has not changed her dietary at this time    Assessment:     Inflammatory capsulitis with probable gout of the first MPJ of the right foot    Plan:     Reviewed gout and I'm going to send her back to her primary care doctor for consideration of medicine alteration. I did a capsular steroidal injection 3 mg Kenalog 5 mill grams Xylocaine to reduce the inflammation of the joint and placed her on 6 day Medrol Dosepak. Reappoint to recheck

## 2014-10-17 ENCOUNTER — Encounter: Payer: Self-pay | Admitting: Vascular Surgery

## 2014-10-19 ENCOUNTER — Ambulatory Visit (HOSPITAL_COMMUNITY)
Admission: RE | Admit: 2014-10-19 | Discharge: 2014-10-19 | Disposition: A | Discharge status: Home / Self Care | Payer: 59 | Source: Ambulatory Visit | Attending: Vascular Surgery | Admitting: Vascular Surgery

## 2014-10-19 ENCOUNTER — Ambulatory Visit (INDEPENDENT_AMBULATORY_CARE_PROVIDER_SITE_OTHER): Payer: 59 | Admitting: Vascular Surgery

## 2014-10-19 ENCOUNTER — Encounter: Payer: Self-pay | Admitting: Vascular Surgery

## 2014-10-19 VITALS — BP 158/72 | HR 58 | Temp 98.3°F | Resp 18 | Ht 65.0 in | Wt 261.0 lb

## 2014-10-19 DIAGNOSIS — M79605 Pain in left leg: Secondary | ICD-10-CM | POA: Diagnosis not present

## 2014-10-19 DIAGNOSIS — M79604 Pain in right leg: Secondary | ICD-10-CM

## 2014-10-19 DIAGNOSIS — I83813 Varicose veins of bilateral lower extremities with pain: Secondary | ICD-10-CM | POA: Diagnosis not present

## 2014-10-19 NOTE — Progress Notes (Signed)
[facility] [place] HISTORY AND PHYSICAL   History of Present Illness:  Patient is a 64 y.o. year old female who presents for evaluation of bilateral burning pain in her legs. Left leg is worse than the right. The burning sensation occurs from the hip down bilaterally. She states the worst. Is trying to go from a sitting to a standing position. She does also occasionally have some pain when walking. She does not really describe classic calf claudication. However, she does not really describe the same pain that would be associated with venous disease as well. She does not really complain of swelling in the lower extremities. She has not worn compression stockings in the past. She states that ibuprofen does help with the pain somewhat. She also admits to being overweight. She does have family history of varicose veins in her mother. She denies history of diabetes. She does have a history of elevated cholesterol and hypertension. She is a former smoker..  Other medical problems include hypothyroidism, coronary artery disease, moderate right carotid stenosis. Her carotid stenosis has been followed by [facility] cardiology in the past. These have all been stable. She is on Plavix and an ACE inhibitor. She has been intolerant of statins in the past.  Past Medical History  Diagnosis Date  . HYPOTHYROIDISM     post ablation tx Graves  . HYPERLIPIDEMIA   . HYPERTENSION   . CAD 06/2009    a.  s/p MI 4/11: tx with DES x 2 to RCA;  b.  LHC 03/17/11: LAD 30-40%, distal LAD 30-40%, OM2 40-50%, RCA stent patent, EF greater than 70%.;  c.  Lexiscan Myoview (12/15):  Mild apical thinning, no ischemia, EF 61%; normal study  . CAROTID STENOSIS     carotid Dopplers 03/25/11: RICA 60-79%; LICA 0-39%.  Follow up recommended in 6 months.  . PVD   . VITAMIN D DEFICIENCY     DEXA 04/2009 normal  . TOBACCO ABUSE quit 06/2009  . Varicose veins     Past Surgical History  Procedure Laterality Date  .  Carotid stent      bilateral carotid artery disease post stent of left carotid  . Radioactive plaque insertion      Graves disease post radioactive treatment complicated hypothyroidism  . Left heart catheterization with coronary angiogram N/A 03/17/2011    Procedure: LEFT HEART CATHETERIZATION WITH CORONARY ANGIOGRAM;  Surgeon: Herby Abraham, MD;  Location: [facility] CATH LAB;  Service: Cardiovascular;  Laterality: N/A;    Social History History  Substance Use Topics  . Smoking status: Former Smoker    Quit date: 06/30/2009  . 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  Divorced, single, lives alone- 2 grown dtr nearby, 3 g-kds  . Alcohol Use: No    Family History Family History  Problem Relation Age of Onset  . Coronary artery disease Other   . Hypertension Other   . Heart attack Mother   . Stroke Mother   . Cancer Mother   . Varicose Veins Mother   . Hypertension Sister   . Cancer Father     Allergies  Allergies  Allergen Reactions  . Amlodipine Swelling  . Statins Other (See Comments)    myalgia     Current Outpatient Prescriptions  Medication Sig Dispense Refill  . albuterol (PROVENTIL HFA;VENTOLIN HFA) 108 (90 BASE) MCG/ACT inhaler Inhale 1 puff into the lungs every 6 (six)  hours as needed for wheezing or shortness of breath.    . allopurinol (ZYLOPRIM) 100 MG tablet Take 1 tablet (100 mg total) by mouth daily. 90 tablet 0  . aspirin 81 MG tablet Take 81 mg by mouth as needed for pain.    Marland Kitchen clopidogrel (PLAVIX) 75 MG tablet Take 1 tablet (75 mg total) by mouth daily. (Patient taking differently: Take 75 mg by mouth every other day. ) 90 tablet 3  . colchicine (COLCRYS) 0.6 MG tablet 0.6 mg daily as needed (for flare ups).     . fluticasone (FLONASE) 50 MCG/ACT nasal spray Place 2 sprays into both nostrils as needed for allergies or rhinitis.     . hydrALAZINE (APRESOLINE) 25 MG tablet Take 1  tablet (25 mg total) by mouth 3 (three) times daily. 90 tablet 6  . levothyroxine (SYNTHROID, LEVOTHROID) 112 MCG tablet Take 1 tablet (112 mcg total) by mouth daily before breakfast. 90 tablet 1  . lisinopril (PRINIVIL,ZESTRIL) 40 MG tablet TAKE 1 TABLET BY MOUTH DAILY 90 tablet 3  . methylPREDNISolone (MEDROL DOSEPAK) 4 MG TBPK tablet follow package directions 21 tablet 0  . nitroGLYCERIN (NITROSTAT) 0.4 MG SL tablet Place 0.4 mg under the tongue every 5 (five) minutes as needed for chest pain.     Marland Kitchen spironolactone (ALDACTONE) 25 MG tablet Take 1 tablet (25 mg total) by mouth daily. 90 tablet 1  . zolpidem (AMBIEN) 10 MG tablet Take 1 tablet (10 mg total) by mouth at bedtime as needed for sleep. 30 tablet 5   No current facility-administered medications for this visit.    ROS:   General:  No weight loss, Fever, chills  HEENT: No recent headaches, no nasal bleeding, no visual changes, no sore throat  Neurologic: No dizziness, blackouts, seizures. No recent symptoms of stroke or mini- stroke. No recent episodes of slurred speech, or temporary blindness.  Cardiac: No recent episodes of chest pain/pressure, no shortness of breath at rest.  No shortness of breath with exertion.  Denies history of atrial fibrillation or irregular heartbeat  Vascular: No history of rest pain in feet.  No history of claudication.  No history of non-healing ulcer, No history of DVT   Pulmonary: No home oxygen, no productive cough, no hemoptysis,  No asthma or wheezing  Musculoskeletal:  [ ]  Arthritis, [x Low back pain,  [ ]  Joint pain  Hematologic:No history of hypercoagulable state.  No history of easy bleeding.  No history of anemia  Gastrointestinal: No hematochezia or melena,  No gastroesophageal reflux, no trouble swallowing  Urinary: [ ]  chronic Kidney disease, [ ]  on HD - [ ]  MWF or [ ]  TTHS, [ ]  Burning with urination, [ ]  Frequent urination, [ ]  Difficulty urinating;   Skin: No  rashes  Psychological: No history of anxiety,  No history of depression   Physical Examination  Filed Vitals:   10/19/14 1437 10/19/14 1439  BP: 170/84 158/72  Pulse: 56 58  Temp: 98.3 F (36.8 C)   TempSrc: Oral   Resp: 18   Height: 5\' 5"  (1.651 m)   Weight: 261 lb (118.389 kg)   SpO2: 97%     Body mass index is 43.43 kg/(m^2).  General:  Alert and oriented, no acute distress HEENT: Normal Neck: No bruit or JVD Pulmonary: Clear to auscultation bilaterally Cardiac: Regular Rate and Rhythm without murmur Abdomen: Soft, non-tender, non-distended, no mass, obese Skin: No rash, scattered spider-type varicosities multiple segments on the calf bilaterally Extremity Pulses:  2+ radial, brachial, femoral, absent popliteal dorsalis pedis, posterior tibial pulses bilaterally Musculoskeletal: No deformity or edema  Neurologic: Upper and lower extremity motor 5/5 and symmetric  DATA:  Patient had a venous duplex exam performed today. This showed no evidence of DVT. The deep venous system on the left side did have mild reflux. The superficial system on the left side was normal. On the right side she had mild reflux in the right greater saphenous vein but no deep vein reflux.   ASSESSMENT:  Mild lower extremity venous reflux which I do not believe explains all of the numbness that she is having in her legs. Her primary complaint is going from sitting to standing. This sounds more musculoskeletal in origin rather than vascular. However, she also did not have a completely normal pulse exam and she probably needs bilateral ABIs performed in the future with her history of peripheral arterial disease carotid disease and coronary artery disease. However, she does not really describe claudication symptoms and I would perform this only for diagnostic purposes.   PLAN: The patient was given a prescription today for bilateral lower extremity compression stockings for symptomatic relief. She'll  continue to take ibuprofen intermittently for pain flareups. She'll return in 3 months time to see if her symptoms are improved. At that time we will also perform bilateral ABIs.  Fabienne Bruns, MD [facility] [place] Office: [phone] Pager: [phone]

## 2015-01-23 ENCOUNTER — Ambulatory Visit: Payer: 59 | Admitting: Vascular Surgery

## 2015-01-25 ENCOUNTER — Ambulatory Visit: Payer: 59 | Admitting: Vascular Surgery

## 2015-01-25 ENCOUNTER — Encounter (HOSPITAL_COMMUNITY): Payer: 59

## 2015-02-15 ENCOUNTER — Encounter (HOSPITAL_COMMUNITY): Payer: 59

## 2015-02-15 ENCOUNTER — Ambulatory Visit: Payer: 59 | Admitting: Vascular Surgery

## 2015-03-02 ENCOUNTER — Encounter: Payer: Self-pay | Admitting: Vascular Surgery

## 2015-03-05 ENCOUNTER — Other Ambulatory Visit: Payer: Self-pay | Admitting: *Deleted

## 2015-03-05 DIAGNOSIS — I739 Peripheral vascular disease, unspecified: Secondary | ICD-10-CM

## 2015-03-08 ENCOUNTER — Ambulatory Visit (INDEPENDENT_AMBULATORY_CARE_PROVIDER_SITE_OTHER): Payer: 59 | Admitting: Vascular Surgery

## 2015-03-08 ENCOUNTER — Encounter: Payer: Self-pay | Admitting: Vascular Surgery

## 2015-03-08 ENCOUNTER — Ambulatory Visit (HOSPITAL_COMMUNITY)
Admission: RE | Admit: 2015-03-08 | Discharge: 2015-03-08 | Disposition: A | Discharge status: Home / Self Care | Payer: 59 | Source: Ambulatory Visit | Attending: Vascular Surgery | Admitting: Vascular Surgery

## 2015-03-08 VITALS — BP 116/62 | HR 73 | Temp 98.1°F | Resp 16 | Ht 66.0 in | Wt 270.0 lb

## 2015-03-08 DIAGNOSIS — Z87891 Personal history of nicotine dependence: Secondary | ICD-10-CM | POA: Insufficient documentation

## 2015-03-08 DIAGNOSIS — I10 Essential (primary) hypertension: Secondary | ICD-10-CM | POA: Insufficient documentation

## 2015-03-08 DIAGNOSIS — E785 Hyperlipidemia, unspecified: Secondary | ICD-10-CM | POA: Insufficient documentation

## 2015-03-08 DIAGNOSIS — I739 Peripheral vascular disease, unspecified: Secondary | ICD-10-CM | POA: Insufficient documentation

## 2015-03-08 DIAGNOSIS — M79604 Pain in right leg: Secondary | ICD-10-CM | POA: Diagnosis not present

## 2015-03-08 NOTE — Progress Notes (Signed)
[facility] [place]  Patient name: Caitlyn [name] MRN: [id number] DOB: 1951-01-14 Sex: female  REASON FOR VISIT: follow-up  HPI: Caitlyn [name] is a 64 y.o. female he presents for follow-up of her bilateral leg pain. She was last seen in the office on 10/19/2014 for bilateral burning pain in her legs. She had a venous reflux study that showed mild reflux bilaterally. She was recommended compression stockings. She returns today for follow-up of her symptoms. She continues to complain of pain in her legs. Her right leg is significantly worse than the left. She mostly complains of pain in her right thigh, although this pain also affects her lower right leg. She denies any pain in her toes and foot. The pain is throbbing in nature. It is worse with standing and eases with walking. She denies any chronic back pain. She does have intermittent back pain when she has "worked all day." She has never seen an orthopedic physician.   Past Medical History  Diagnosis Date  . HYPOTHYROIDISM     post ablation tx [facility]  . HYPERLIPIDEMIA   . HYPERTENSION   . CAD 06/2009    a.  s/p MI 4/11: tx with DES x 2 to RCA;  b.  LHC 03/17/11: LAD 30-40%, distal LAD 30-40%, OM2 40-50%, RCA stent patent, EF greater than 70%.;  c.  Lexiscan Myoview (12/15):  Mild apical thinning, no ischemia, EF 61%; normal study  . CAROTID STENOSIS     carotid Dopplers 03/25/11: RICA 60-79%; LICA 0-39%.  Follow up recommended in 6 months.  . PVD   . VITAMIN D DEFICIENCY     DEXA 04/2009 normal  . TOBACCO ABUSE quit 06/2009  . Varicose veins     Family History  Problem Relation Age of Onset  . Coronary artery disease Other   . Hypertension Other   . Heart attack Mother   . Stroke Mother   . Cancer Mother   . Varicose Veins Mother   . Hypertension Sister   . Cancer Father     SOCIAL HISTORY: Social History  Substance Use Topics  . Smoking status: Former Smoker    Quit date: 06/30/2009  . 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  Divorced, single, lives alone- 2 grown dtr nearby, 3 g-kds  . Alcohol Use: No    Allergies  Allergen Reactions  . Amlodipine Swelling  . Statins Other (See Comments)    myalgia    Current Outpatient Prescriptions  Medication Sig Dispense Refill  . albuterol (PROVENTIL HFA;VENTOLIN HFA) 108 (90 BASE) MCG/ACT inhaler Inhale 1 puff into the lungs every 6 (six) hours as needed for wheezing or shortness of breath.    . allopurinol (ZYLOPRIM) 100 MG tablet Take 1 tablet (100 mg total) by mouth daily. (Patient taking differently: Take 300 mg by mouth daily. ) 90 tablet 0  . aspirin 81 MG tablet Take 81 mg by mouth as needed for pain.    Marland Kitchen clopidogrel (PLAVIX) 75 MG tablet Take 1 tablet (75 mg total) by mouth daily. (Patient taking differently: Take 75 mg by mouth every other day. ) 90 tablet 3  . colchicine (COLCRYS) 0.6 MG tablet 0.6 mg daily as needed (for flare ups).     . fluticasone (FLONASE) 50 MCG/ACT nasal spray Place 2 sprays into both nostrils as needed for allergies or rhinitis.     Marland Kitchen levothyroxine (SYNTHROID,  LEVOTHROID) 112 MCG tablet Take 1 tablet (112 mcg total) by mouth daily before breakfast. 90 tablet 1  . lisinopril (PRINIVIL,ZESTRIL) 40 MG tablet TAKE 1 TABLET BY MOUTH DAILY 90 tablet 3  . methylPREDNISolone (MEDROL DOSEPAK) 4 MG TBPK tablet follow package directions 21 tablet 0  . metoprolol succinate (TOPROL-XL) 25 MG 24 hr tablet daily.    . nitroGLYCERIN (NITROSTAT) 0.4 MG SL tablet Place 0.4 mg under the tongue every 5 (five) minutes as needed for chest pain.     Marland Kitchen zolpidem (AMBIEN) 10 MG tablet Take 1 tablet (10 mg total) by mouth at bedtime as needed for sleep. 30 tablet 5  . hydrALAZINE (APRESOLINE) 25 MG tablet Take 1 tablet (25 mg total) by mouth 3 (three) times daily. (Patient not taking: Reported on 03/08/2015) 90 tablet 6  . spironolactone (ALDACTONE) 25 MG  tablet Take 1 tablet (25 mg total) by mouth daily. (Patient not taking: Reported on 03/08/2015) 90 tablet 1   No current facility-administered medications for this visit.    REVIEW OF SYSTEMS:   denotes positive finding,  denotes negative finding Cardiac  Comments:  Chest pain or chest pressure:    Shortness of breath upon exertion:    Short of breath when lying flat:    Irregular heart rhythm:        Vascular    Pain in calf, thigh, or hip brought on by ambulation: x Right thigh worse  Pain in feet at night that wakes you up from your sleep:     Blood clot in your veins:    Leg swelling:         Pulmonary    Oxygen at home:    Productive cough:     Wheezing:         Neurologic    Sudden weakness in arms or legs:     Sudden numbness in arms or legs:     Sudden onset of difficulty speaking or slurred speech:    Temporary loss of vision in one eye:     Problems with dizziness:         Gastrointestinal    Blood in stool:     Vomited blood:         Genitourinary    Burning when urinating:     Blood in urine:        Psychiatric    Major depression:         Hematologic    Bleeding problems:    Problems with blood clotting too easily:        Skin    Rashes or ulcers:        Constitutional    Fever or chills:      PHYSICAL EXAM: Filed Vitals:   03/08/15 1544 03/08/15 1554  BP: 142/63 116/62  Pulse: 76 73  Temp: 98.1 F (36.7 C)   TempSrc: Oral   Resp: 16   Height:  (1.676 m)   Weight: 270 lb (122.471 kg)   SpO2: 100%     GENERAL: The patient is a well-nourished female, in no acute distress. The vital signs are documented above. CARDIAC: There is a regular rate and rhythm. No carotid bruits. VASCULAR: palpable femoral pulses bilaterally. Non-palpable right pedal pulses. Palpable left posterior tibial pulse. PULMONARY: There is good air exchange bilaterally without wheezing or rales. ABDOMEN: Soft and non-tender with normal pitched bowel sounds.   MUSCULOSKELETAL: There are no major deformities or cyanosis. NEUROLOGIC: No focal weakness or  paresthesias are detected. SKIN: There are no ulcers or rashes noted. PSYCHIATRIC: The patient has a normal affect.  DATA:  ABIs 03/08/2015  Right: 0.92, biphasic PT, biphasic AT Left: 1.17, triphasic PT, triphasic AT  MEDICAL ISSUES:  Bilateral leg pain, right significantly worse than left The patient's pain is worse with standing and improves with ambulation. These symptoms are atypical of peripheral vascular disease. Her ABIs today are normal. However, on physical exam, she has nonpalpable pedal pulses in her right foot. Her ABIs may be falsely elevated due to calcified vessels. Offered the patient an arteriogram to definitively rule out peripheral vascular disease. If the patient has any disease amenable to percutaneous intervention, this will be performed during her procedure. The patient wishes to proceed with arteriogram. She'll be scheduled for an arteriogram with bilateral runoff and possible intervention on 04/06/2015 with Dr. Darrick PennaFields. If her studies are negative, will recommend referral to orthopedics for spinal workup or to her PCP for further evaluation.   Caitlyn GurneyKimberly A Trinh, Caitlyn [name] [facility] [place]   History and exam findings as above. Patient's right leg symptoms a be related more nerve root compression based on her history. However, I am unable to palpate pulses in her right lower extremity at the pedal level. She has biphasic waveforms on the right compared to triphasic on the left   Plan for aortogram bilateral extremity runoff most likely for diagnostic purposes on January 20. I did discuss with the patient today that if we do find a treatable lesion then possible angioplasty and stenting would perform in the same setting. Risks benefits possible complications and procedure details of aortogram with runoff works Caitlyn [name] the patient today including not limited to  bleeding infection  vessel injury. She understands and agrees to proceed.   If there is no significant finding on her arteriogram we will consider referring her back to her primary care physician for further evaluation for consideration of evaluation by orthopedics.  Caitlyn Brunsharles Johnpaul Gillentine, Caitlyn [name] [facility] [place] Office: [phone] Pager: [phone]

## 2015-03-08 NOTE — Progress Notes (Signed)
Filed Vitals:   03/08/15 1544 03/08/15 1554  BP: 142/63 116/62  Pulse: 76 73  Temp: 98.1 F (36.7 C)   TempSrc: Oral   Resp: 16   Height: 5\' 6"  (1.676 m)   Weight: 270 lb (122.471 kg)   SpO2: 100%

## 2015-03-09 ENCOUNTER — Other Ambulatory Visit: Payer: Self-pay | Admitting: *Deleted

## 2015-04-06 ENCOUNTER — Ambulatory Visit (HOSPITAL_COMMUNITY)
Admission: RE | Admit: 2015-04-06 | Discharge: 2015-04-06 | Disposition: A | Discharge status: Home / Self Care | Payer: 59 | Source: Ambulatory Visit | Attending: Vascular Surgery | Admitting: Vascular Surgery

## 2015-04-06 ENCOUNTER — Encounter (HOSPITAL_COMMUNITY)
Admission: RE | Disposition: A | Discharge status: Home / Self Care | Payer: Self-pay | Source: Ambulatory Visit | Attending: Vascular Surgery

## 2015-04-06 ENCOUNTER — Other Ambulatory Visit: Payer: Self-pay | Admitting: *Deleted

## 2015-04-06 DIAGNOSIS — I2510 Atherosclerotic heart disease of native coronary artery without angina pectoris: Secondary | ICD-10-CM | POA: Insufficient documentation

## 2015-04-06 DIAGNOSIS — Z87891 Personal history of nicotine dependence: Secondary | ICD-10-CM | POA: Diagnosis not present

## 2015-04-06 DIAGNOSIS — Z955 Presence of coronary angioplasty implant and graft: Secondary | ICD-10-CM | POA: Diagnosis not present

## 2015-04-06 DIAGNOSIS — Z6841 Body Mass Index (BMI) 40.0 and over, adult: Secondary | ICD-10-CM | POA: Diagnosis not present

## 2015-04-06 DIAGNOSIS — I6523 Occlusion and stenosis of bilateral carotid arteries: Secondary | ICD-10-CM | POA: Diagnosis not present

## 2015-04-06 DIAGNOSIS — Z7902 Long term (current) use of antithrombotics/antiplatelets: Secondary | ICD-10-CM | POA: Insufficient documentation

## 2015-04-06 DIAGNOSIS — Z8249 Family history of ischemic heart disease and other diseases of the circulatory system: Secondary | ICD-10-CM | POA: Insufficient documentation

## 2015-04-06 DIAGNOSIS — I70321 Atherosclerosis of unspecified type of bypass graft(s) of the extremities with rest pain, right leg: Secondary | ICD-10-CM | POA: Diagnosis not present

## 2015-04-06 DIAGNOSIS — I8390 Asymptomatic varicose veins of unspecified lower extremity: Secondary | ICD-10-CM | POA: Diagnosis not present

## 2015-04-06 DIAGNOSIS — I252 Old myocardial infarction: Secondary | ICD-10-CM | POA: Diagnosis not present

## 2015-04-06 DIAGNOSIS — I70221 Atherosclerosis of native arteries of extremities with rest pain, right leg: Secondary | ICD-10-CM | POA: Insufficient documentation

## 2015-04-06 DIAGNOSIS — E669 Obesity, unspecified: Secondary | ICD-10-CM | POA: Insufficient documentation

## 2015-04-06 DIAGNOSIS — E559 Vitamin D deficiency, unspecified: Secondary | ICD-10-CM | POA: Insufficient documentation

## 2015-04-06 DIAGNOSIS — I739 Peripheral vascular disease, unspecified: Secondary | ICD-10-CM

## 2015-04-06 DIAGNOSIS — E039 Hypothyroidism, unspecified: Secondary | ICD-10-CM | POA: Insufficient documentation

## 2015-04-06 DIAGNOSIS — Z7982 Long term (current) use of aspirin: Secondary | ICD-10-CM | POA: Insufficient documentation

## 2015-04-06 DIAGNOSIS — I10 Essential (primary) hypertension: Secondary | ICD-10-CM | POA: Diagnosis not present

## 2015-04-06 DIAGNOSIS — E785 Hyperlipidemia, unspecified: Secondary | ICD-10-CM | POA: Diagnosis not present

## 2015-04-06 HISTORY — PX: PERIPHERAL VASCULAR CATHETERIZATION: SHX172C

## 2015-04-06 LAB — POCT I-STAT, CHEM 8
BUN: 17 mg/dL (ref 6–20)
CALCIUM ION: 1.2 mmol/L (ref 1.13–1.30)
CHLORIDE: 105 mmol/L (ref 101–111)
Creatinine, Ser: 0.8 mg/dL (ref 0.44–1.00)
GLUCOSE: 89 mg/dL (ref 65–99)
HCT: 44 % (ref 36.0–46.0)
Hemoglobin: 15 g/dL (ref 12.0–15.0)
Potassium: 4 mmol/L (ref 3.5–5.1)
SODIUM: 142 mmol/L (ref 135–145)
TCO2: 26 mmol/L (ref 0–100)

## 2015-04-06 LAB — POCT ACTIVATED CLOTTING TIME: Activated Clotting Time: 168 seconds

## 2015-04-06 SURGERY — ABDOMINAL AORTOGRAM
Anesthesia: LOCAL

## 2015-04-06 MED ORDER — HEPARIN SODIUM (PORCINE) 1000 UNIT/ML IJ SOLN
INTRAMUSCULAR | Status: DC | PRN
  Administered 2015-04-06: 3000 [IU] via INTRAVENOUS

## 2015-04-06 MED ORDER — OXYCODONE-ACETAMINOPHEN 5-325 MG PO TABS
ORAL_TABLET | ORAL | Status: AC
  Administered 2015-04-06: 2 via ORAL
  Filled 2015-04-06: qty 2

## 2015-04-06 MED ORDER — HYDRALAZINE HCL 20 MG/ML IJ SOLN
INTRAMUSCULAR | Status: DC | PRN
  Administered 2015-04-06: 10 mg via INTRAVENOUS

## 2015-04-06 MED ORDER — ALUM & MAG HYDROXIDE-SIMETH 200-200-20 MG/5ML PO SUSP: 15.0000 mL | ORAL | Status: DC | PRN

## 2015-04-06 MED ORDER — METOPROLOL SUCCINATE ER 25 MG PO TB24
25.0000 mg | ORAL_TABLET | ORAL | Status: AC
  Administered 2015-04-06: 25 mg via ORAL
  Filled 2015-04-06: qty 1

## 2015-04-06 MED ORDER — LABETALOL HCL 5 MG/ML IV SOLN: 10.0000 mg | INTRAVENOUS | Status: DC | PRN

## 2015-04-06 MED ORDER — ACETAMINOPHEN 325 MG RE SUPP: 325.0000 mg | RECTAL | Status: DC | PRN

## 2015-04-06 MED ORDER — HYDRALAZINE HCL 20 MG/ML IJ SOLN: 5.0000 mg | INTRAMUSCULAR | Status: DC | PRN

## 2015-04-06 MED ORDER — METOPROLOL TARTRATE 1 MG/ML IV SOLN: 2.0000 mg | INTRAVENOUS | Status: DC | PRN

## 2015-04-06 MED ORDER — OXYCODONE-ACETAMINOPHEN 5-325 MG PO TABS
1.0000 | ORAL_TABLET | ORAL | Status: DC | PRN
  Administered 2015-04-06: 2 via ORAL

## 2015-04-06 MED ORDER — LISINOPRIL 40 MG PO TABS
40.0000 mg | ORAL_TABLET | ORAL | Status: AC
  Administered 2015-04-06: 40 mg via ORAL
  Filled 2015-04-06: qty 1

## 2015-04-06 MED ORDER — HEPARIN (PORCINE) IN NACL 2-0.9 UNIT/ML-% IJ SOLN
INTRAMUSCULAR | Status: AC
  Filled 2015-04-06: qty 1000

## 2015-04-06 MED ORDER — HYDRALAZINE HCL 20 MG/ML IJ SOLN
INTRAMUSCULAR | Status: AC
  Filled 2015-04-06: qty 1

## 2015-04-06 MED ORDER — SODIUM CHLORIDE 0.45 % IV SOLN: INTRAVENOUS | Status: DC

## 2015-04-06 MED ORDER — HEPARIN SODIUM (PORCINE) 1000 UNIT/ML IJ SOLN
INTRAMUSCULAR | Status: AC
  Filled 2015-04-06: qty 1

## 2015-04-06 MED ORDER — MORPHINE SULFATE (PF) 10 MG/ML IV SOLN: 2.0000 mg | INTRAVENOUS | Status: DC | PRN

## 2015-04-06 MED ORDER — SODIUM CHLORIDE 0.9 % IV SOLN
INTRAVENOUS | Status: DC
Start: 2015-04-06 — End: 2015-04-06
  Administered 2015-04-06: 07:00:00 via INTRAVENOUS

## 2015-04-06 MED ORDER — LIDOCAINE HCL (PF) 1 % IJ SOLN
INTRAMUSCULAR | Status: AC
  Filled 2015-04-06: qty 30

## 2015-04-06 MED ORDER — ACETAMINOPHEN 325 MG PO TABS: 325.0000 mg | ORAL_TABLET | ORAL | Status: DC | PRN

## 2015-04-06 MED ORDER — ONDANSETRON HCL 4 MG/2ML IJ SOLN: 4.0000 mg | Freq: Four times a day (QID) | INTRAMUSCULAR | Status: DC | PRN

## 2015-04-06 MED ORDER — IODIXANOL 320 MG/ML IV SOLN
INTRAVENOUS | Status: DC | PRN
  Administered 2015-04-06: 195 mL via INTRAVENOUS

## 2015-04-06 MED ORDER — LIDOCAINE HCL (PF) 1 % IJ SOLN
INTRAMUSCULAR | Status: DC | PRN
  Administered 2015-04-06: 5 mL

## 2015-04-06 SURGICAL SUPPLY — 11 items
CATH ANGIO 5F PIGTAIL 65CM (CATHETERS) ×3 IMPLANT
CATH CROSS OVER TEMPO 5F (CATHETERS) ×3 IMPLANT
CATH STRAIGHT 5FR 65CM (CATHETERS) ×3 IMPLANT
COVER PRB 48X5XTLSCP FOLD TPE (BAG) ×2 IMPLANT
COVER PROBE 5X48 (BAG) ×1
KIT PV (KITS) ×3 IMPLANT
SHEATH PINNACLE 5F 10CM (SHEATH) ×3 IMPLANT
SYR MEDRAD MARK V 150ML (SYRINGE) ×3 IMPLANT
TRANSDUCER W/STOPCOCK (MISCELLANEOUS) ×3 IMPLANT
TRAY PV CATH (CUSTOM PROCEDURE TRAY) ×3 IMPLANT
WIRE HITORQ VERSACORE ST 145CM (WIRE) ×3 IMPLANT

## 2015-04-06 NOTE — Progress Notes (Signed)
Pt c/o 10/10 pain to left groin, hematoma felt, pressure applied and Kim, PA notified.  Pressure applied for 10 minutes with resolution of hematoma. No new orders.  Will continue to monitor

## 2015-04-06 NOTE — H&P (View-Only) (Signed)
[facility] [place]  Patient name: Caitlyn [name] MRN: [id number] DOB: [birth year] Sex: female  REASON FOR VISIT: follow-up  HPI: Caitlyn [name] is a 65 y.o. female he presents for follow-up of her bilateral leg pain. She was last seen in the office on 10/19/2014 for bilateral burning pain in her legs. She had a venous reflux study that showed mild reflux bilaterally. She was recommended compression stockings. She returns today for follow-up of her symptoms. She continues to complain of pain in her legs. Her right leg is significantly worse than the left. She mostly complains of pain in her right thigh, although this pain also affects her lower right leg. She denies any pain in her toes and foot. The pain is throbbing in nature. It is worse with standing and eases with walking. She denies any chronic back pain. She does have intermittent back pain when she has "worked all day." She has never seen an orthopedic physician.   Past Medical History  Diagnosis Date  . HYPOTHYROIDISM     post ablation tx [facility]  . HYPERLIPIDEMIA   . HYPERTENSION   . CAD 06/2009    a.  s/p MI 4/11: tx with DES x 2 to RCA;  b.  LHC 03/17/11: LAD 30-40%, distal LAD 30-40%, OM2 40-50%, RCA stent patent, EF greater than 70%.;  c.  Lexiscan Myoview (12/15):  Mild apical thinning, no ischemia, EF 61%; normal study  . CAROTID STENOSIS     carotid Dopplers 03/25/11: RICA 60-79%; LICA 0-39%.  Follow up recommended in 6 months.  . PVD   . VITAMIN D DEFICIENCY     DEXA 04/2009 normal  . TOBACCO ABUSE quit 06/2009  . Varicose veins     Family History  Problem Relation Age of Onset  . Coronary artery disease Other   . Hypertension Other   . Heart attack Mother   . Stroke Mother   . Cancer Mother   . Varicose Veins Mother   . Hypertension Sister   . Cancer Father     SOCIAL HISTORY: Social History  Substance Use Topics  . Smoking status: Former Smoker    Quit date: 06/30/2009  . 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 Alcohol Use: No    Allergies  Allergen Reactions  . Amlodipine Swelling  . Statins Other (See Comments)    myalgia    Current Outpatient Prescriptions  Medication Sig Dispense Refill  . albuterol (PROVENTIL HFA;VENTOLIN HFA) 108 (90 BASE) MCG/ACT inhaler Inhale 1 puff into the lungs every 6 (six) hours as needed for wheezing or shortness of breath.    . allopurinol (ZYLOPRIM) 100 MG tablet Take 1 tablet (100 mg total) by mouth daily. (Patient taking differently: Take 300 mg by mouth daily. ) 90 tablet 0  . aspirin 81 MG tablet Take 81 mg by mouth as needed for pain.    Marland Kitchen clopidogrel (PLAVIX) 75 MG tablet Take 1 tablet (75 mg total) by mouth daily. (Patient taking differently: Take 75 mg by mouth every other day. ) 90 tablet 3  . colchicine (COLCRYS) 0.6 MG tablet 0.6 mg daily as needed (for flare ups).     . fluticasone (FLONASE) 50 MCG/ACT nasal spray Place 2 sprays into both nostrils as needed for allergies or rhinitis.     Marland Kitchen levothyroxine (SYNTHROID,  LEVOTHROID) 112 MCG tablet Take 1 tablet (112 mcg total) by mouth daily before breakfast. 90 tablet 1  . lisinopril (PRINIVIL,ZESTRIL) 40 MG tablet TAKE 1 TABLET BY MOUTH DAILY 90 tablet 3  . methylPREDNISolone (MEDROL DOSEPAK) 4 MG TBPK tablet follow package directions 21 tablet 0  . metoprolol succinate (TOPROL-XL) 25 MG 24 hr tablet daily.    . nitroGLYCERIN (NITROSTAT) 0.4 MG SL tablet Place 0.4 mg under the tongue every 5 (five) minutes as needed for chest pain.     . zolpidem (AMBIEN) 10 MG tablet Take 1 tablet (10 mg total) by mouth at bedtime as needed for sleep. 30 tablet 5  . hydrALAZINE (APRESOLINE) 25 MG tablet Take 1 tablet (25 mg total) by mouth 3 (three) times daily. (Patient not taking: Reported on 03/08/2015) 90 tablet 6  . spironolactone (ALDACTONE) 25 MG  tablet Take 1 tablet (25 mg total) by mouth daily. (Patient not taking: Reported on 03/08/2015) 90 tablet 1   No current facility-administered medications for this visit.    REVIEW OF SYSTEMS:  [X] denotes positive finding, [ ] denotes negative finding Cardiac  Comments:  Chest pain or chest pressure:    Shortness of breath upon exertion:    Short of breath when lying flat:    Irregular heart rhythm:        Vascular    Pain in calf, thigh, or hip brought on by ambulation: x Right thigh worse  Pain in feet at night that wakes you up from your sleep:     Blood clot in your veins:    Leg swelling:         Pulmonary    Oxygen at home:    Productive cough:     Wheezing:         Neurologic    Sudden weakness in arms or legs:     Sudden numbness in arms or legs:     Sudden onset of difficulty speaking or slurred speech:    Temporary loss of vision in one eye:     Problems with dizziness:         Gastrointestinal    Blood in stool:     Vomited blood:         Genitourinary    Burning when urinating:     Blood in urine:        Psychiatric    Major depression:         Hematologic    Bleeding problems:    Problems with blood clotting too easily:        Skin    Rashes or ulcers:        Constitutional    Fever or chills:      PHYSICAL EXAM: Filed Vitals:   03/08/15 1544 03/08/15 1554  BP: 142/63 116/62  Pulse: 76 73  Temp: 98.1 F (36.7 C)   TempSrc: Oral   Resp: 16   Height: 5' 6" (1.676 m)   Weight: 270 lb (122.471 kg)   SpO2: 100%     GENERAL: The patient is a well-nourished female, in no acute distress. The vital signs are documented above. CARDIAC: There is a regular rate and rhythm. No carotid bruits. VASCULAR: palpable femoral pulses bilaterally. Non-palpable right pedal pulses. Palpable left posterior tibial pulse. PULMONARY: There is good air exchange bilaterally without wheezing or rales. ABDOMEN: Soft and non-tender with normal pitched bowel sounds.   MUSCULOSKELETAL: There are no major deformities or cyanosis. NEUROLOGIC: No focal weakness or   paresthesias are detected. SKIN: There are no ulcers or rashes noted. PSYCHIATRIC: The patient has a normal affect.  DATA:  ABIs 03/08/2015  Right: 0.92, biphasic PT, biphasic AT Left: 1.17, triphasic PT, triphasic AT  MEDICAL ISSUES:  Bilateral leg pain, right significantly worse than left The patient's pain is worse with standing and improves with ambulation. These symptoms are atypical of peripheral vascular disease. Her ABIs today are normal. However, on physical exam, she has nonpalpable pedal pulses in her right foot. Her ABIs may be falsely elevated due to calcified vessels. Offered the patient an arteriogram to definitively rule out peripheral vascular disease. If the patient has any disease amenable to percutaneous intervention, this will be performed during her procedure. The patient wishes to proceed with arteriogram. She'll be scheduled for an arteriogram with bilateral runoff and possible intervention on 04/06/2015 with Dr. Darrick PennaFields. If her studies are negative, will recommend referral to orthopedics for spinal workup or to her PCP for further evaluation.   Raymond GurneyKimberly A Trinh, PA-C [facility] [place]   History and exam findings as above. Patient's right leg symptoms a be related more nerve root compression based on her history. However, I am unable to palpate pulses in her right lower extremity at the pedal level. She has biphasic waveforms on the right compared to triphasic on the left   Plan for aortogram bilateral extremity runoff most likely for diagnostic purposes on January 20. I did discuss with the patient today that if we do find a treatable lesion then possible angioplasty and stenting would perform in the same setting. Risks benefits possible complications and procedure details of aortogram with runoff works [place] the patient today including not limited to  bleeding infection  vessel injury. She understands and agrees to proceed.   If there is no significant finding on her arteriogram we will consider referring her back to her primary care physician for further evaluation for consideration of evaluation by orthopedics.  Fabienne Brunsharles Hadley Detloff, MD [facility] [place] Office: [phone] Pager: [phone]

## 2015-04-06 NOTE — Discharge Instructions (Signed)
Angiogram, Care After Refer to this sheet in the next few weeks. These instructions provide you with information about caring for yourself after your procedure. Your health care provider may also give you more specific instructions. Your treatment has been planned according to current medical practices, but problems sometimes occur. Call your health care provider if you have any problems or questions after your procedure. WHAT TO EXPECT AFTER THE PROCEDURE After your procedure, it is typical to have the following:  Bruising at the catheter insertion site that usually fades within 1-2 weeks.  Blood collecting in the tissue (hematoma) that may be painful to the touch. It should usually decrease in size and tenderness within 1-2 weeks. HOME CARE INSTRUCTIONS  Take medicines only as directed by your health care provider.  You may shower 24-48 hours after the procedure or as directed by your health care provider. Remove the bandage (dressing) and gently wash the site with plain soap and water. Pat the area dry with a clean towel. Do not rub the site, because this may cause bleeding.  Do not take baths, swim, or use a hot tub until your health care provider approves.  Check your insertion site every day for redness, swelling, or drainage.  Do not apply powder or lotion to the site.  Do not lift over 10 lb (4.5 kg) for 5 days after your procedure or as directed by your health care provider.  Ask your health care provider when it is okay to:  Return to work or school.  Resume usual physical activities or sports.  Resume sexual activity.  Do not drive home if you are discharged the same day as the procedure. Have someone else drive you.  You may drive 24 hours after the procedure unless otherwise instructed by your health care provider.  Do not operate machinery or power tools for 24 hours after the procedure or as directed by your health care provider.  If your procedure was done as an  outpatient procedure, which means that you went home the same day as your procedure, a responsible adult should be with you for the first 24 hours after you arrive home.  Keep all follow-up visits as directed by your health care provider. This is important. SEEK MEDICAL CARE IF:  You have a fever.  You have chills.  You have increased bleeding from the catheter insertion site. Hold pressure on the site. SEEK IMMEDIATE MEDICAL CARE IF:  You have unusual pain at the catheter insertion site.  You have redness, warmth, or swelling at the catheter insertion site.  You have drainage (other than a small amount of blood on the dressing) from the catheter insertion site.  The catheter insertion site is bleeding, and the bleeding does not stop after 30 minutes of holding steady pressure on the site.  The area near or just beyond the catheter insertion site becomes pale, cool, tingly, or numb.   This information is not intended to replace advice given to you by your health care provider. Make sure you discuss any questions you have with your health care provider.   Document Released: 09/19/2004 Document Revised: 03/24/2014 Document Reviewed: 08/04/2012 Elsevier Interactive Patient Education 2016 [employer]. Angiogram An angiogram, also called angiography, is a procedure used to look at the blood vessels. In this procedure, dye is injected through a long, thin tube (catheter) into an artery. X-rays are then taken. The X-rays will show if there is a blockage or problem in a blood vessel.  LET  YOUR HEALTH CARE PROVIDER KNOW ABOUT:  Any allergies you have, including allergies to shellfish or contrast dye.   All medicines you are taking, including vitamins, herbs, eye drops, creams, and over-the-counter medicines.   Previous problems you or members of your family have had with the use of anesthetics.   Any blood disorders you have.   Previous surgeries you have had.  Any previous  kidney problems or failure you have had.  Medical conditions you have.   Possibility of pregnancy, if this applies. RISKS AND COMPLICATIONS Generally, an angiogram is a safe procedure. However, as with any procedure, problems can occur. Possible problems include:  Injury to the blood vessels, including rupture or bleeding.  Infection or bruising at the catheter site.  Allergic reaction to the dye or contrast used.  Kidney damage from the dye or contrast used.  Blood clots that can lead to a stroke or heart attack. BEFORE THE PROCEDURE  Do not eat or drink after midnight on the night before the procedure, or as directed by your health care provider.   Ask your health care provider if you may drink enough water to take any needed medicines the morning of the procedure.  PROCEDURE  You may be given a medicine to help you relax (sedative) before and during the procedure. This medicine is given through an IV access tube that is inserted into one of your veins.   The area where the catheter will be inserted will be washed and shaved. This is usually done in the groin but may be done in the fold of your arm (near your elbow) or in the wrist.  A medicine will be given to numb the area where the catheter will be inserted (local anesthetic).  The catheter will be inserted with a guide wire into an artery. The catheter is guided by using a type of X-ray (fluoroscopy) to the blood vessel being examined.   Dye is then injected into the catheter, and X-rays are taken. The dye helps to show where any narrowing or blockages are located.  AFTER THE PROCEDURE   If the procedure is done through the leg, you will be kept in bed lying flat for several hours. You will be instructed to not bend or cross your legs.  The insertion site will be checked frequently.  The pulse in your feet or wrist will be checked frequently.  Additional blood tests, X-rays, and electrocardiography may be done.    You may need to stay in the hospital overnight for observation.    This information is not intended to replace advice given to you by your health care provider. Make sure you discuss any questions you have with your health care provider.   Document Released: 12/11/2004 Document Revised: 03/24/2014 Document Reviewed: 08/04/2012 Elsevier Interactive Patient Education [employer]

## 2015-04-06 NOTE — Interval H&P Note (Signed)
History and Physical Interval Note:  04/06/2015 7:41 AM  Caitlyn [name]  has presented today for surgery, with the diagnosis of Bilateral Lower extremity pain  The various methods of treatment have been discussed with the patient and family. After consideration of risks, benefits and other options for treatment, the patient has consented to  Procedure(s): Abdominal Aortogram (N/A) as a surgical intervention .  The patient's history has been reviewed, patient examined, no change in status, stable for surgery.  I have reviewed the patient's chart and labs.  Questions were answered to the patient's satisfaction.     Fabienne Bruns

## 2015-04-06 NOTE — Progress Notes (Addendum)
Site area: LFA Site Prior to Removal:  Level 0 Pressure Applied For:4min Manual:   yes Patient Status During Pull: SBP to 78 with rapid recovery   Post Pull Site:  Level 0 Post Pull Instructions Given:  yes Post Pull Pulses Present: doppler Dressing Applied:  clear Bedrest begins @ 0945 till 1445 Comments:100cc bolus given with SBP 78. No significant drop in HR. Pt felt "sick" and 'hot". Rapid recovery.

## 2015-04-06 NOTE — Op Note (Signed)
Procedure: Abdominal aortogram with bilateral lower extremity runoff  Preoperative diagnosis: Right leg pain  Postoperative diagnosis: Same  Anesthesia: Local  Operative findings: #1 patent aortoiliac system #2 70% narrowing origin right superficial femoral artery less than 1 cm in length #3 otherwise patent bilateral lower extremity runoff  Operative details: After pain informed consent, the patient was taken to the fibula. The patient was placed in supine position the Angio table. Both groins were prepped and draped in usual sterile fashion. Local anesthesia was threaded over the left common femoral artery. Ultrasound was used to identify the left common femoral artery. An introducer needle was then used to cannulate the left common femoral artery without difficulty. An 035 versacore wire was then threaded up into the abdominal aorta under fluoroscopic guidance. A 5 French sheath was placed over the guidewire and the left common femoral artery. This was thoroughly flushed with saline. A 5 French pigtail catheter was then placed over the guidewire and advanced up to the level of T12. An abdominal aortogram was obtained in AP projection. Left and right renal arteries are widely patent. Infrarenal abdominal aorta is patent. The left and right common iliac arteries are patent. The left and right internal iliac arteries are patent. There is some irregularity of the distal right external iliac artery as well as the left external iliac artery. At this point the pigtail catheter was pulled down just above the aortic bifurcation and bilateral oblique views of the pelvis were obtained. This shows about a 25% narrowing of the distal right external iliac artery with no significant flow-limiting lesion of the left external iliac artery. Next bilateral lower extremity runoff views were obtained through the pigtail catheter.  In the right lower extremity, the right common femoral and profunda femoris arteries are  widely patent. There is a 70% stenosis of the origin of the right superficial femoral artery. This is best seen on an oblique RAO view. Otherwise the right superficial femoral artery popliteal posterior tibial and peroneal arteries are all widely patent. The anterior tibial artery is patent proximally but occludes in the distal portion of the leg above the ankle.  In the left lower extremity, the left common femoral tunnel for MRSA and superficial femoral arteries are all widely patent. There is three-vessel runoff to the left foot. There is mild tibial disease distally but otherwise the vessels are patent.  At this point, the pigtail catheter was removed over a guidewire and a 5 [place] crossover catheter was brought up in the operative field and advanced into the right common iliac artery. The guidewire was then advanced into the distal right external iliac artery. Crossover catheter was exchanged for a 5 French straight catheter. Additional views of the right femoral bifurcation were performed to further define the right superficial femoral artery stenosis. At this point consideration was given for intervening on the origin stenosis of the right superficial femoral artery. However in discussions with the patient she wishes to observe this for now and think about whether or not she wishes to have intervention. The patient had been given 3000 units of intravenous heparin in preparation for intervention. At this point the 5 French straight catheter and guidewire removed. A 5 French sheath was thoroughly flushed with heparinized saline.  The patient tolerated the procedure well and there were no complications. The patient was taken to the holding area in stable condition.  Operative management: Patient has a short segment right superficial femoral artery origin stenosis of about 70%. This may be  contributed to some of her symptoms but probably does not explain all of her symptoms. I believe it is reasonable  for medical management of this currently. As angioplasty of this could have short durability and if she develops restenosis she may overall have worse symptoms in the right leg. We will have her follow-up with [place] in 6 months time for repeat ABIs. In the meanwhile she will try to lose some weight control her hypertension and cholesterol. She will also need to be continued to monitor for development of diabetes due to her obesity. She also has has some evidence of tibial disease suggestive of intermittent hyperglycemia. We will check a hemoglobin A1c today.  Fabienne Bruns, MD [facility] Office: [phone] Pager: [phone]

## 2015-04-07 ENCOUNTER — Other Ambulatory Visit: Payer: Self-pay | Admitting: Internal Medicine

## 2015-04-07 ENCOUNTER — Other Ambulatory Visit: Payer: Self-pay | Admitting: Physician Assistant

## 2015-04-09 ENCOUNTER — Telehealth: Payer: Self-pay | Admitting: Vascular Surgery

## 2015-04-09 ENCOUNTER — Encounter (HOSPITAL_COMMUNITY): Payer: Self-pay | Admitting: Vascular Surgery

## 2015-04-09 NOTE — Telephone Encounter (Signed)
LM for pt re appt, dpm °

## 2015-04-09 NOTE — Telephone Encounter (Signed)
-----   Message from Sharee Pimple, RN sent at 04/06/2015  9:11 AM EST ----- Regarding: schedule   ----- Message -----    From: Sherren Kerns, MD    Sent: 04/06/2015   8:33 AM      To: [facility] Charge Pool  Aortogram with bilat runoff first order right iliac  She needs follow up with Rosalita Chessman in 6 months with ABIs  Fabienne Bruns

## 2015-05-14 ENCOUNTER — Telehealth: Payer: Self-pay

## 2015-05-14 NOTE — Telephone Encounter (Signed)
Called patient about refill for Plavix and she stated this should have went to her PCP. I explained I would send the refill request back to [facility] with note to send to PCP.

## 2015-05-17 ENCOUNTER — Other Ambulatory Visit: Payer: Self-pay | Admitting: *Deleted

## 2015-05-17 ENCOUNTER — Other Ambulatory Visit: Payer: Self-pay

## 2015-05-17 MED ORDER — LISINOPRIL 40 MG PO TABS: 40.0000 mg | ORAL_TABLET | Freq: Every day | ORAL | Status: DC

## 2015-05-17 MED ORDER — CLOPIDOGREL BISULFATE 75 MG PO TABS: ORAL_TABLET | ORAL | Status: DC

## 2015-05-18 ENCOUNTER — Other Ambulatory Visit: Payer: Self-pay | Admitting: Cardiology

## 2015-05-18 DIAGNOSIS — R079 Chest pain, unspecified: Secondary | ICD-10-CM

## 2015-05-25 ENCOUNTER — Encounter (HOSPITAL_COMMUNITY)
Admission: RE | Admit: 2015-05-25 | Discharge: 2015-05-25 | Disposition: A | Discharge status: Home / Self Care | Payer: 59 | Source: Ambulatory Visit | Attending: Cardiology | Admitting: Cardiology

## 2015-05-25 DIAGNOSIS — R079 Chest pain, unspecified: Secondary | ICD-10-CM | POA: Diagnosis not present

## 2015-05-25 LAB — BASIC METABOLIC PANEL
ANION GAP: 11 (ref 5–15)
BUN: 15 mg/dL (ref 6–20)
CALCIUM: 9.3 mg/dL (ref 8.9–10.3)
CHLORIDE: 108 mmol/L (ref 101–111)
CO2: 23 mmol/L (ref 22–32)
Creatinine, Ser: 0.85 mg/dL (ref 0.44–1.00)
GFR calc Af Amer: >60 mL/min (ref >60)
GFR calc non Af Amer: >60 mL/min (ref >60)
GLUCOSE: 98 mg/dL (ref 65–99)
POTASSIUM: 4.4 mmol/L (ref 3.5–5.1)
Sodium: 142 mmol/L (ref 135–145)

## 2015-05-25 LAB — HEPATIC FUNCTION PANEL
ALT: 18 U/L (ref 14–54)
AST: 22 U/L (ref 15–41)
Albumin: 3.7 g/dL (ref 3.5–5.0)
Alkaline Phosphatase: 102 U/L (ref 38–126)
Total Bilirubin: 0.3 mg/dL (ref 0.3–1.2)
Total Protein: 7.1 g/dL (ref 6.5–8.1)

## 2015-05-25 LAB — TSH: TSH: 2.636 u[IU]/mL (ref 0.350–4.500)

## 2015-05-25 LAB — LIPID PANEL
CHOL/HDL RATIO: 5 ratio
CHOLESTEROL: 229 mg/dL — AB (ref 0–200)
HDL: 46 mg/dL (ref >40)
LDL CALC: 157 mg/dL — AB (ref 0–99)
TRIGLYCERIDES: 130 mg/dL (ref <150)
VLDL: 26 mg/dL (ref 0–40)

## 2015-05-25 MED ORDER — TECHNETIUM TC 99M SESTAMIBI GENERIC - CARDIOLITE
30.0000 | Freq: Once | INTRAVENOUS | Status: AC | PRN
  Administered 2015-05-25: 30 via INTRAVENOUS

## 2015-05-25 MED ORDER — TECHNETIUM TC 99M SESTAMIBI GENERIC - CARDIOLITE
10.0000 | Freq: Once | INTRAVENOUS | Status: AC | PRN
  Administered 2015-05-25: 10 via INTRAVENOUS

## 2015-05-25 MED ORDER — REGADENOSON 0.4 MG/5ML IV SOLN
0.4000 mg | Freq: Once | INTRAVENOUS | Status: DC
Start: 2015-05-25 — End: 2015-05-31

## 2015-05-25 MED ORDER — REGADENOSON 0.4 MG/5ML IV SOLN
INTRAVENOUS | Status: AC
  Filled 2015-05-25: qty 5

## 2015-06-13 ENCOUNTER — Other Ambulatory Visit: Payer: Self-pay | Admitting: *Deleted

## 2015-06-13 NOTE — Telephone Encounter (Signed)
Caitlyn [name]  05/14/2015  Telephone  MRN:  [id number]   Description: 65 year old female  Provider: Joanette Gulaandice A Price, CMA  Department: [facility]       Call Documentation      Joanette Gulaandice A Price, CMA at 05/14/2015 5:10 PM     Status: Signed       Expand All Collapse All   Called patient about refill for Plavix and she stated this should have went to her PCP. I explained I would send the refill request back to [facility] with note to send to PCP.             Refused      Disp Refills Start End    clopidogrel (PLAVIX) 75 MG tablet 30 tablet 0 05/14/2015     Class:  Normal    DAW:  No    Comment:  Please call our office to schedule an yearly appointment before anymore refills. [phone]. Thank you 1st attempt    Reason for Refusal:  Refill not appropriate    Reason for Refusal Comment:  Send to PCP    Refused By:  Joanette Gulaandice A Price, CMA      [facility]     [id number] [place] - [phone]     Contacts       Type Contact Phone    05/14/2015 05:09 PM Phone (Outgoing) Caitlyn MoralesCarter, Caitlyn D (Self) [phone] (H)    Called patient about refill for Plavix and she stated this should have went to her PCP. I explained I would send the refill request back to [facility] with note to send to PCP.    clopidogrel (PLAVIX) 75 MG tablet  Medication   Date: 05/17/2015  [facility]  Ordering/Authorizing: Beatrice LecherScott T Weaver, PA-C      Order Providers    Prescribing Provider Encounter Provider   Beatrice LecherScott T Weaver, PA-C Awilda BillBrandy J Travaris Kosh, CMA    Medication Detail      Disp Refills Start End     clopidogrel (PLAVIX) 75 MG tablet 30 tablet 0 05/17/2015     Sig: TAKE 1 TABLET (75 MG TOTAL) BY MOUTH DAILY.    Notes to Pharmacy: Please call our office to schedule an yearly appointment before anymore refills. [phone]. Thank you 2nd attempt    E-Prescribing Status: Receipt  confirmed by pharmacy (05/17/2015 12:07 PM EST)     Pharmacy    [id number] - [place] - [phone]   We filled it second time, pt has yet to make appt, will send back with note to send to PCP

## 2015-06-25 ENCOUNTER — Other Ambulatory Visit: Payer: Self-pay

## 2015-06-25 MED ORDER — CLOPIDOGREL BISULFATE 75 MG PO TABS: ORAL_TABLET | ORAL | Status: DC

## 2015-07-24 ENCOUNTER — Other Ambulatory Visit: Payer: Self-pay

## 2015-07-24 DIAGNOSIS — Z1231 Encounter for screening mammogram for malignant neoplasm of breast: Secondary | ICD-10-CM

## 2015-08-03 ENCOUNTER — Ambulatory Visit
Admission: RE | Admit: 2015-08-03 | Discharge: 2015-08-03 | Disposition: A | Discharge status: Home / Self Care | Payer: 59 | Source: Ambulatory Visit

## 2015-08-03 DIAGNOSIS — Z1231 Encounter for screening mammogram for malignant neoplasm of breast: Secondary | ICD-10-CM

## 2015-08-31 ENCOUNTER — Other Ambulatory Visit: Payer: Self-pay | Admitting: Physician Assistant

## 2015-09-03 NOTE — Telephone Encounter (Signed)
Caitlyn [name]  05/14/2015  Telephone  MRN:  [id number]   Description: 65 year old female  Provider: Joanette Gulaandice A Price, CMA  Department: [facility]       Call Documentation      Joanette Gulaandice A Price, CMA at 05/14/2015 5:10 PM     Status: Signed       Expand All Collapse All   Called patient about refill for Plavix and she stated this should have went to her PCP. I explained I would send the refill request back to [facility] with note to send to PCP.             Refused      Disp Refills Start End    clopidogrel (PLAVIX) 75 MG tablet 30 tablet 0 05/14/2015     Class:  Normal    DAW:  No    Comment:  Please call our office to schedule an yearly appointment before anymore refills. [phone]. Thank you 1st attempt    Reason for Refusal:  Refill not appropriate    Reason for Refusal Comment:  Send to PCP    Refused By:  Joanette Gulaandice A Price, CMA      [facility]     [address] [id number] [place] - [phone]     Contacts       Type Contact Phone    05/14/2015 05:09 PM Phone (Outgoing) Caitlyn MoralesCarter, Caitlyn D (Self) [phone] (H)    Called patient about refill for Plavix and she stated this should have went to her PCP. I explained I would send the refill request back to [facility] with note to send to PCP.      Created by     Joanette Gulaandice A Price, CMA on 05/14/2015 05:09 PM

## 2015-10-04 ENCOUNTER — Encounter: Payer: Self-pay | Admitting: Family

## 2015-10-08 ENCOUNTER — Encounter: Payer: Self-pay | Admitting: Family

## 2015-10-08 ENCOUNTER — Ambulatory Visit (INDEPENDENT_AMBULATORY_CARE_PROVIDER_SITE_OTHER): Payer: 59 | Admitting: Family

## 2015-10-08 ENCOUNTER — Ambulatory Visit (HOSPITAL_COMMUNITY)
Admission: RE | Admit: 2015-10-08 | Discharge: 2015-10-08 | Disposition: A | Discharge status: Home / Self Care | Payer: 59 | Source: Ambulatory Visit | Attending: Vascular Surgery | Admitting: Vascular Surgery

## 2015-10-08 VITALS — BP 148/77 | HR 61 | Temp 97.2°F | Resp 18 | Ht 65.0 in | Wt 266.7 lb

## 2015-10-08 DIAGNOSIS — Z87891 Personal history of nicotine dependence: Secondary | ICD-10-CM

## 2015-10-08 DIAGNOSIS — I779 Disorder of arteries and arterioles, unspecified: Secondary | ICD-10-CM

## 2015-10-08 DIAGNOSIS — I2510 Atherosclerotic heart disease of native coronary artery without angina pectoris: Secondary | ICD-10-CM | POA: Insufficient documentation

## 2015-10-08 DIAGNOSIS — I739 Peripheral vascular disease, unspecified: Secondary | ICD-10-CM | POA: Insufficient documentation

## 2015-10-08 DIAGNOSIS — R938 Abnormal findings on diagnostic imaging of other specified body structures: Secondary | ICD-10-CM | POA: Diagnosis not present

## 2015-10-08 DIAGNOSIS — I10 Essential (primary) hypertension: Secondary | ICD-10-CM | POA: Insufficient documentation

## 2015-10-08 DIAGNOSIS — R0989 Other specified symptoms and signs involving the circulatory and respiratory systems: Secondary | ICD-10-CM | POA: Diagnosis present

## 2015-10-08 DIAGNOSIS — E785 Hyperlipidemia, unspecified: Secondary | ICD-10-CM | POA: Diagnosis not present

## 2015-10-08 DIAGNOSIS — E039 Hypothyroidism, unspecified: Secondary | ICD-10-CM | POA: Diagnosis not present

## 2015-10-08 DIAGNOSIS — Z720 Tobacco use: Secondary | ICD-10-CM | POA: Insufficient documentation

## 2015-10-08 NOTE — Progress Notes (Signed)
[facility] [place]   CC: Follow up peripheral artery occlusive disease  History of Present Illness Caitlyn [name] is a 65 y.o. female patient of Dr. Darrick Penna who is s/p arteriogram on 04/06/15 for right leg pain. Arteriogram findings and recommendations were: Patient has a short segment right superficial femoral artery origin stenosis of about 70%. This may be contributed to some of her symptoms but probably does not explain all of her symptoms. I (Dr. Darrick Penna) believe it is reasonable for medical management of this currently. As angioplasty of this could have short durability and if she develops restenosis she may overall have worse symptoms in the right leg. We will have her follow-up with [place] in 6 months time for repeat ABIs. In the meanwhile she will try to lose some weight control her hypertension and cholesterol. She will also need to be continued to monitor for development of diabetes due to her obesity. She also has has some evidence of tibial disease suggestive of intermittent hyperglycemia.  She returns today for 6 months follow up.  She states both legs hurt equally, even when sitting. 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  She denies non healing wounds in her feet/legs.   Her PMHX includes CAD with stenting in 2011 after an MI, Graves Disease with history of ablation, morbid obesity, and former smoker.  Pt Diabetic: states she was told that she had prediabetes Pt smoker: former smoker, quit in 2010  Pt meds include: Statin :Yes Betablocker: Yes ASA: Yes Other anticoagulants/antiplatelets: Plavix   Past Medical History:  Diagnosis Date  . CAD 06/2009   a.  s/p MI 4/11: tx with DES x 2 to RCA;  b.  LHC 03/17/11: LAD 30-40%, distal LAD 30-40%, OM2 40-50%, RCA stent patent, EF greater than 70%.;  c.  Lexiscan Myoview (12/15):  Mild apical thinning, no ischemia, EF 61%; normal study  . CAROTID STENOSIS    carotid Dopplers 03/25/11: RICA 60-79%;  LICA 0-39%.  Follow up recommended in 6 months.  Marland Kitchen HYPERLIPIDEMIA   . HYPERTENSION   . HYPOTHYROIDISM    post ablation tx Graves  . PVD   . TOBACCO ABUSE quit 06/2009  . Varicose veins   . VITAMIN D DEFICIENCY    DEXA 04/2009 normal    Social History Social History  Substance Use Topics  . Smoking status: Former Smoker    Quit date: 06/30/2009  . 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  Divorced, single, lives alone- 2 grown dtr nearby, 3 g-kds  . Alcohol use No    Family History Family History  Problem Relation Age of Onset  . Coronary artery disease Other   . Hypertension Other   . Heart attack Mother   . Stroke Mother   . Cancer Mother   . Varicose Veins Mother   . Hypertension Sister   . Cancer Father     Past Surgical History:  Procedure Laterality Date  . CAROTID STENT     bilateral carotid artery disease post stent of left carotid  . LEFT HEART CATHETERIZATION WITH CORONARY ANGIOGRAM N/A 03/17/2011   Procedure: LEFT HEART CATHETERIZATION WITH CORONARY ANGIOGRAM;  Surgeon: Herby Abraham, MD;  Location: [facility] CATH LAB;  Service: Cardiovascular;  Laterality: N/A;  . PERIPHERAL VASCULAR CATHETERIZATION N/A 04/06/2015   Procedure: Abdominal Aortogram;  Surgeon: Sherren Kerns, MD;  [facility];  Service: Cardiovascular;  Laterality: N/A;  . RADIOACTIVE PLAQUE INSERTION     Graves disease post radioactive treatment complicated hypothyroidism    Allergies  Allergen Reactions  . Amlodipine Swelling  . Statins Other (See Comments)    myalgia    Current Outpatient Prescriptions  Medication Sig Dispense Refill  . albuterol (PROVENTIL HFA;VENTOLIN HFA) 108 (90 BASE) MCG/ACT inhaler Inhale 1 puff into the lungs every 6 (six) hours as needed for wheezing or shortness of breath.    . allopurinol (ZYLOPRIM) 100 MG tablet Take 1 tablet (100 mg total) by mouth daily. (Patient taking  differently: Take 300 mg by mouth as needed. ) 90 tablet 0  . aspirin 81 MG tablet Take 81 mg by mouth as needed for pain. Reported on 04/04/2015    . clopidogrel (PLAVIX) 75 MG tablet TAKE 1 TABLET (75 MG TOTAL) BY MOUTH DAILY. 30 tablet 0  . colchicine (COLCRYS) 0.6 MG tablet 0.6 mg daily as needed (for flare ups).     . fluticasone (FLONASE) 50 MCG/ACT nasal spray Place 2 sprays into both nostrils as needed for allergies or rhinitis.     Marland Kitchen levothyroxine (SYNTHROID, LEVOTHROID) 112 MCG tablet Take 1 tablet (112 mcg total) by mouth daily before breakfast. 90 tablet 1  . lisinopril (PRINIVIL,ZESTRIL) 40 MG tablet Take 1 tablet (40 mg total) by mouth daily. Patient needs appointment with new PCP for further refills 30 tablet 0  . metoprolol succinate (TOPROL-XL) 25 MG 24 hr tablet Take 25 mg by mouth daily.     . nitroGLYCERIN (NITROSTAT) 0.4 MG SL tablet Place 0.4 mg under the tongue every 5 (five) minutes as needed for chest pain.     Marland Kitchen zolpidem (AMBIEN) 10 MG tablet Take 1 tablet (10 mg total) by mouth at bedtime as needed for sleep. 30 tablet 5  . atorvastatin (LIPITOR) 20 MG tablet 20 mg daily. Takes 1/2 tablet daily.  (per patient)    . clonazePAM (KLONOPIN) 1 MG tablet 1 mg daily.     No current facility-administered medications for this visit.     ROS: See HPI for pertinent positives and negatives.   Physical Examination  Vitals:   10/08/15 1607 10/08/15 1610  BP: (!) 142/80 (!) 148/77  Pulse: 61   Resp: 18   Temp: 97.2 F (36.2 C)   TempSrc: Oral   SpO2: 98%   Weight: 266 lb 11.2 oz (121 kg)   Height:  (1.651 m)    Body mass index is 44.38 kg/m.  General: A&O x 3, WDWN, morbidly obese female. Gait: normal Eyes: PERRLA. Bilateral exophthalmos.  Pulmonary: CTAB, without wheezes , rales or rhonchi. Cardiac: regular rhythm, + murmur.         Carotid Bruits Right Left   Negative Negative  Aorta is not palpable. Radial pulses: 2+ palpable and =                            VASCULAR EXAM: Extremities without ischemic changes, without Gangrene; without open wounds.  LE Pulses Right Left       FEMORAL  not palpable (obese)  not palpable (obese)        POPLITEAL  not  palpable    Not palpable       POSTERIOR TIBIAL  not palpable    palpable        DORSALIS PEDIS      ANTERIOR TIBIAL not palpable  not palpable    Abdomen: softly obese, NT, no palpable masses. Skin: no rashes, no ulcers. Musculoskeletal: no muscle wasting or atrophy.  Neurologic: A&O X 3; Appropriate Affect ; SENSATION: normal; MOTOR FUNCTION:  moving all extremities equally, motor strength 5/5 throughout. Speech is fluent/normal.  CN 2-12 intact. Pain elicited in low low back with straight leg raise test in both legs.   ASSESSMENT: JAZZMYNE KELLOUGH is a 65 y.o. female who presents with: a short segment right superficial femoral artery origin stenosis of about 70% found on arteriogram in January 2017. This may contribute to some of her symptoms but probably does not explain all of her symptoms. She has no signs of ischemia in her feet/legs.  Left ABI and TBI remains normal with triphasic waveforms.  Right ABI waveforms have diminished from bi to monophasic, 92% to 72%, right TBI of 0.69.  Her atherosclerotic risk factors include  CAD with stenting in 2011 after an MI, morbid obesity, and former smoker.  Pain elicited in low low back with straight leg raise test in both legs; consider referral to neurosurgeon for evaluation of her lumbar spine; will defer to pt's PCP.  She is considering bariatric surgery.    PLAN:  Based on the patient's vascular studies and examination, pt will return to clinic in 6 months with ABI's on a day that Dr. Darrick Penna is in the office.  Graduated walking program discussed and how to achieve.  I discussed in depth with the patient the nature of  atherosclerosis, and emphasized the importance of maximal medical management including strict control of blood pressure, blood glucose, and lipid levels, obtaining regular exercise, and continued cessation of smoking.  The patient is aware that without maximal medical management the underlying atherosclerotic disease process will progress, limiting the benefit of any interventions.  The patient was given information about PAD including signs, symptoms, treatment, what symptoms should prompt the patient to seek immediate medical care, and risk reduction measures to take.  Charisse March, RN, MSN, FNP-C [facility] Phone: [phone]  Clinic MD: Myra Gianotti  10/08/15 4:32 PM

## 2015-10-08 NOTE — Patient Instructions (Signed)
Peripheral Vascular Disease Peripheral vascular disease (PVD) is a disease of the blood vessels that are not part of your heart and brain. A simple term for PVD is poor circulation. In most cases, PVD narrows the blood vessels that carry blood from your heart to the rest of your body. This can result in a decreased supply of blood to your arms, legs, and internal organs, like your stomach or kidneys. However, it most often affects a person's lower legs and feet. There are two types of PVD.  Organic PVD. This is the more common type. It is caused by damage to the structure of blood vessels.  Functional PVD. This is caused by conditions that make blood vessels contract and tighten (spasm). Without treatment, PVD tends to get worse over time. PVD can also lead to acute ischemic limb. This is when an arm or limb suddenly has trouble getting enough blood. This is a medical emergency. CAUSES Each type of PVD has many different causes. The most common cause of PVD is buildup of a fatty material (plaque) inside of your arteries (atherosclerosis). Small amounts of plaque can break off from the walls of the blood vessels and become lodged in a smaller artery. This blocks blood flow and can cause acute ischemic limb. Other common causes of PVD include:  Blood clots that form inside of blood vessels.  Injuries to blood vessels.  Diseases that cause inflammation of blood vessels or cause blood vessel spasms.  Health behaviors and health history that increase your risk of developing PVD. RISK FACTORS  You may have a greater risk of PVD if you:  Have a family history of PVD.  Have certain medical conditions, including:  High cholesterol.  Diabetes.  High blood pressure (hypertension).  Coronary heart disease.  Past problems with blood clots.  Past injury, such as burns or a broken bone. These may have damaged blood vessels in your limbs.  Buerger disease. This is caused by inflamed blood  vessels in your hands and feet.  Some forms of arthritis.  Rare birth defects that affect the arteries in your legs.  Use tobacco.  Do not get enough exercise.  Are obese.  Are age 50 or older. SIGNS AND SYMPTOMS  PVD may cause many different symptoms. Your symptoms depend on what part of your body is not getting enough blood. Some common signs and symptoms include:  Cramps in your lower legs. This may be a symptom of poor leg circulation (claudication).  Pain and weakness in your legs while you are physically active that goes away when you rest (intermittent claudication).  Leg pain when at rest.  Leg numbness, tingling, or weakness.  Coldness in a leg or foot, especially when compared with the other leg.  Skin or hair changes. These can include:  Hair loss.  Shiny skin.  Pale or bluish skin.  Thick toenails.  Inability to get or maintain an erection (erectile dysfunction). People with PVD are more prone to developing ulcers and sores on their toes, feet, or legs. These may take longer than normal to heal. DIAGNOSIS Your health care provider may diagnose PVD from your signs and symptoms. The health care provider will also do a physical exam. You may have tests to find out what is causing your PVD and determine its severity. Tests may include:  Blood pressure recordings from your arms and legs and measurements of the strength of your pulses (pulse volume recordings).  Imaging studies using sound waves to take pictures of   the blood flow through your blood vessels (Doppler ultrasound).  Injecting a dye into your blood vessels before having imaging studies using:  X-rays (angiogram or arteriogram).  Computer-generated X-rays (CT angiogram).  A powerful electromagnetic field and a computer (magnetic resonance angiogram or MRA). TREATMENT Treatment for PVD depends on the cause of your condition and the severity of your symptoms. It also depends on your age. Underlying  causes need to be treated and controlled. These include long-lasting (chronic) conditions, such as diabetes, high cholesterol, and high blood pressure. You may need to first try making lifestyle changes and taking medicines. Surgery may be needed if these do not work. Lifestyle changes may include:  Quitting smoking.  Exercising regularly.  Following a low-fat, low-cholesterol diet. Medicines may include:  Blood thinners to prevent blood clots.  Medicines to improve blood flow.  Medicines to improve your blood cholesterol levels. Surgical procedures may include:  A procedure that uses an inflated balloon to open a blocked artery and improve blood flow (angioplasty).  A procedure to put in a tube (stent) to keep a blocked artery open (stent implant).  Surgery to reroute blood flow around a blocked artery (peripheral bypass surgery).  Surgery to remove dead tissue from an infected wound on the affected limb.  Amputation. This is surgical removal of the affected limb. This may be necessary in cases of acute ischemic limb that are not improved through medical or surgical treatments. HOME CARE INSTRUCTIONS  Take medicines only as directed by your health care provider.  Do not use any tobacco products, including cigarettes, chewing tobacco, or electronic cigarettes. If you need help quitting, ask your health care provider.  Lose weight if you are overweight, and maintain a healthy weight as directed by your health care provider.  Eat a diet that is low in fat and cholesterol. If you need help, ask your health care provider.  Exercise regularly. Ask your health care provider to suggest some good activities for you.  Use compression stockings or other mechanical devices as directed by your health care provider.  Take good care of your feet.  Wear comfortable shoes that fit well.  Check your feet often for any cuts or sores. SEEK MEDICAL CARE IF:  You have cramps in your legs  while walking.  You have leg pain when you are at rest.  You have coldness in a leg or foot.  Your skin changes.  You have erectile dysfunction.  You have cuts or sores on your feet that are not healing. SEEK IMMEDIATE MEDICAL CARE IF:  Your arm or leg turns cold and blue.  Your arms or legs become red, warm, swollen, painful, or numb.  You have chest pain or trouble breathing.  You suddenly have weakness in your face, arm, or leg.  You become very confused or lose the ability to speak.  You suddenly have a very bad headache or lose your vision.   This information is not intended to replace advice given to you by your health care provider. Make sure you discuss any questions you have with your health care provider.   Document Released: 04/10/2004 Document Revised: 03/24/2014 Document Reviewed: 08/11/2013 Elsevier Interactive Patient Education 2016 [employer] Inc[employer]  

## 2015-12-12 ENCOUNTER — Other Ambulatory Visit: Payer: Self-pay | Admitting: Cardiology

## 2015-12-12 DIAGNOSIS — R079 Chest pain, unspecified: Secondary | ICD-10-CM

## 2015-12-13 ENCOUNTER — Other Ambulatory Visit: Payer: Self-pay | Admitting: Orthopedic Surgery

## 2015-12-13 DIAGNOSIS — M48061 Spinal stenosis, lumbar region without neurogenic claudication: Secondary | ICD-10-CM

## 2015-12-19 ENCOUNTER — Encounter (HOSPITAL_COMMUNITY): Admission: RE | Admit: 2015-12-19 | Payer: 59 | Source: Ambulatory Visit

## 2015-12-19 ENCOUNTER — Other Ambulatory Visit (HOSPITAL_COMMUNITY): Payer: 59

## 2015-12-19 ENCOUNTER — Ambulatory Visit (HOSPITAL_COMMUNITY): Payer: 59

## 2015-12-19 ENCOUNTER — Ambulatory Visit (HOSPITAL_COMMUNITY): Admission: RE | Admit: 2015-12-19 | Payer: 59 | Source: Ambulatory Visit

## 2015-12-19 ENCOUNTER — Encounter (HOSPITAL_COMMUNITY): Payer: 59

## 2015-12-22 ENCOUNTER — Inpatient Hospital Stay: Admission: RE | Admit: 2015-12-22 | Payer: 59 | Source: Ambulatory Visit

## 2015-12-24 ENCOUNTER — Encounter (HOSPITAL_COMMUNITY)
Admission: RE | Admit: 2015-12-24 | Discharge: 2015-12-24 | Disposition: A | Discharge status: Home / Self Care | Payer: 59 | Source: Ambulatory Visit | Attending: Cardiology | Admitting: Cardiology

## 2015-12-24 DIAGNOSIS — I200 Unstable angina: Secondary | ICD-10-CM | POA: Insufficient documentation

## 2015-12-24 DIAGNOSIS — R079 Chest pain, unspecified: Secondary | ICD-10-CM | POA: Insufficient documentation

## 2015-12-24 MED ORDER — REGADENOSON 0.4 MG/5ML IV SOLN: 0.4000 mg | Freq: Once | INTRAVENOUS | Status: DC

## 2015-12-24 MED ORDER — REGADENOSON 0.4 MG/5ML IV SOLN
INTRAVENOUS | Status: AC
Start: 2015-12-24 — End: 2015-12-24
  Administered 2015-12-24: 0.4 mg
  Filled 2015-12-24: qty 5

## 2015-12-24 MED ORDER — TECHNETIUM TC 99M TETROFOSMIN IV KIT
10.0000 | PACK | Freq: Once | INTRAVENOUS | Status: AC | PRN
  Administered 2015-12-24: 10 via INTRAVENOUS

## 2015-12-24 MED ORDER — TECHNETIUM TC 99M TETROFOSMIN IV KIT
30.0000 | PACK | Freq: Once | INTRAVENOUS | Status: AC | PRN
  Administered 2015-12-24: 30 via INTRAVENOUS

## 2015-12-28 ENCOUNTER — Inpatient Hospital Stay: Admission: RE | Admit: 2015-12-28 | Payer: 59 | Source: Ambulatory Visit

## 2016-01-03 ENCOUNTER — Ambulatory Visit
Admission: RE | Admit: 2016-01-03 | Discharge: 2016-01-03 | Disposition: A | Discharge status: Home / Self Care | Payer: 59 | Source: Ambulatory Visit | Attending: Orthopedic Surgery | Admitting: Orthopedic Surgery

## 2016-01-03 DIAGNOSIS — M48061 Spinal stenosis, lumbar region without neurogenic claudication: Secondary | ICD-10-CM

## 2016-01-06 ENCOUNTER — Inpatient Hospital Stay (HOSPITAL_COMMUNITY): Payer: 59

## 2016-01-06 ENCOUNTER — Emergency Department (HOSPITAL_COMMUNITY): Payer: 59

## 2016-01-06 ENCOUNTER — Inpatient Hospital Stay (HOSPITAL_COMMUNITY)
Admission: EM | Admit: 2016-01-06 | Discharge: 2016-01-08 | DRG: 247 | Disposition: A | Discharge status: Home / Self Care | Payer: 59 | Attending: Cardiology | Admitting: Cardiology

## 2016-01-06 ENCOUNTER — Encounter (HOSPITAL_COMMUNITY): Payer: Self-pay | Admitting: Radiology

## 2016-01-06 DIAGNOSIS — I2510 Atherosclerotic heart disease of native coronary artery without angina pectoris: Secondary | ICD-10-CM | POA: Diagnosis present

## 2016-01-06 DIAGNOSIS — I252 Old myocardial infarction: Secondary | ICD-10-CM | POA: Diagnosis not present

## 2016-01-06 DIAGNOSIS — E785 Hyperlipidemia, unspecified: Secondary | ICD-10-CM | POA: Diagnosis present

## 2016-01-06 DIAGNOSIS — R079 Chest pain, unspecified: Secondary | ICD-10-CM

## 2016-01-06 DIAGNOSIS — E559 Vitamin D deficiency, unspecified: Secondary | ICD-10-CM | POA: Diagnosis present

## 2016-01-06 DIAGNOSIS — Z87891 Personal history of nicotine dependence: Secondary | ICD-10-CM | POA: Diagnosis not present

## 2016-01-06 DIAGNOSIS — I10 Essential (primary) hypertension: Secondary | ICD-10-CM | POA: Diagnosis present

## 2016-01-06 DIAGNOSIS — I214 Non-ST elevation (NSTEMI) myocardial infarction: Principal | ICD-10-CM | POA: Diagnosis present

## 2016-01-06 DIAGNOSIS — R911 Solitary pulmonary nodule: Secondary | ICD-10-CM | POA: Diagnosis present

## 2016-01-06 DIAGNOSIS — Z8249 Family history of ischemic heart disease and other diseases of the circulatory system: Secondary | ICD-10-CM | POA: Diagnosis not present

## 2016-01-06 DIAGNOSIS — I739 Peripheral vascular disease, unspecified: Secondary | ICD-10-CM | POA: Diagnosis present

## 2016-01-06 DIAGNOSIS — E039 Hypothyroidism, unspecified: Secondary | ICD-10-CM | POA: Diagnosis present

## 2016-01-06 DIAGNOSIS — Z955 Presence of coronary angioplasty implant and graft: Secondary | ICD-10-CM | POA: Diagnosis not present

## 2016-01-06 DIAGNOSIS — Z809 Family history of malignant neoplasm, unspecified: Secondary | ICD-10-CM | POA: Diagnosis not present

## 2016-01-06 DIAGNOSIS — Z6841 Body Mass Index (BMI) 40.0 and over, adult: Secondary | ICD-10-CM | POA: Diagnosis not present

## 2016-01-06 DIAGNOSIS — Z823 Family history of stroke: Secondary | ICD-10-CM

## 2016-01-06 DIAGNOSIS — I249 Acute ischemic heart disease, unspecified: Secondary | ICD-10-CM | POA: Diagnosis present

## 2016-01-06 HISTORY — DX: Gout, unspecified: M10.9

## 2016-01-06 HISTORY — DX: Unspecified osteoarthritis, unspecified site: M19.90

## 2016-01-06 HISTORY — DX: Depression, unspecified: F32.A

## 2016-01-06 HISTORY — DX: Major depressive disorder, single episode, unspecified: F32.9

## 2016-01-06 HISTORY — DX: Obstructive sleep apnea (adult) (pediatric): Z99.89

## 2016-01-06 HISTORY — DX: Obstructive sleep apnea (adult) (pediatric): G47.33

## 2016-01-06 HISTORY — DX: Anxiety disorder, unspecified: F41.9

## 2016-01-06 LAB — CBC WITH DIFFERENTIAL/PLATELET
BASOS ABS: 0 10*3/uL (ref 0.0–0.1)
Basophils Relative: 0 %
EOS ABS: 0.2 10*3/uL (ref 0.0–0.7)
EOS PCT: 3 %
HCT: 40.9 % (ref 36.0–46.0)
Hemoglobin: 13.1 g/dL (ref 12.0–15.0)
LYMPHS PCT: 37 %
Lymphs Abs: 2.6 10*3/uL (ref 0.7–4.0)
MCH: 30.5 pg (ref 26.0–34.0)
MCHC: 32 g/dL (ref 30.0–36.0)
MCV: 95.1 fL (ref 78.0–100.0)
Monocytes Absolute: 0.4 10*3/uL (ref 0.1–1.0)
Monocytes Relative: 5 %
Neutro Abs: 3.9 10*3/uL (ref 1.7–7.7)
Neutrophils Relative %: 55 %
PLATELETS: 212 10*3/uL (ref 150–400)
RBC: 4.3 MIL/uL (ref 3.87–5.11)
RDW: 14.5 % (ref 11.5–15.5)
WBC: 7.1 10*3/uL (ref 4.0–10.5)

## 2016-01-06 LAB — COMPREHENSIVE METABOLIC PANEL
ALK PHOS: 82 U/L (ref 38–126)
ALT: 15 U/L (ref 14–54)
ANION GAP: 4 — AB (ref 5–15)
AST: 20 U/L (ref 15–41)
Albumin: 3.6 g/dL (ref 3.5–5.0)
BUN: 22 mg/dL — ABNORMAL HIGH (ref 6–20)
CALCIUM: 8.9 mg/dL (ref 8.9–10.3)
CO2: 25 mmol/L (ref 22–32)
Chloride: 111 mmol/L (ref 101–111)
Creatinine, Ser: 0.91 mg/dL (ref 0.44–1.00)
GFR calc non Af Amer: >60 mL/min (ref >60)
Glucose, Bld: 92 mg/dL (ref 65–99)
Potassium: 4 mmol/L (ref 3.5–5.1)
Sodium: 140 mmol/L (ref 135–145)
TOTAL PROTEIN: 7.1 g/dL (ref 6.5–8.1)
Total Bilirubin: 0.4 mg/dL (ref 0.3–1.2)

## 2016-01-06 LAB — TROPONIN I: TROPONIN I: 0.24 ng/mL — AB (ref <0.03)

## 2016-01-06 LAB — PROTIME-INR
INR: 0.98
Prothrombin Time: 13 seconds (ref 11.4–15.2)

## 2016-01-06 LAB — BASIC METABOLIC PANEL
ANION GAP: 6 (ref 5–15)
BUN: 24 mg/dL — ABNORMAL HIGH (ref 6–20)
CO2: 25 mmol/L (ref 22–32)
Calcium: 8.9 mg/dL (ref 8.9–10.3)
Chloride: 110 mmol/L (ref 101–111)
Creatinine, Ser: 1.07 mg/dL — ABNORMAL HIGH (ref 0.44–1.00)
GFR calc Af Amer: >60 mL/min (ref >60)
GFR, EST NON AFRICAN AMERICAN: 54 mL/min — AB (ref >60)
GLUCOSE: 82 mg/dL (ref 65–99)
POTASSIUM: 4.2 mmol/L (ref 3.5–5.1)
Sodium: 141 mmol/L (ref 135–145)

## 2016-01-06 LAB — CBC
HEMATOCRIT: 42.5 % (ref 36.0–46.0)
HEMOGLOBIN: 13.8 g/dL (ref 12.0–15.0)
MCH: 30.9 pg (ref 26.0–34.0)
MCHC: 32.5 g/dL (ref 30.0–36.0)
MCV: 95.1 fL (ref 78.0–100.0)
Platelets: 224 10*3/uL (ref 150–400)
RBC: 4.47 MIL/uL (ref 3.87–5.11)
RDW: 14.6 % (ref 11.5–15.5)
WBC: 6.2 10*3/uL (ref 4.0–10.5)

## 2016-01-06 LAB — APTT: APTT: 29 s (ref 24–36)

## 2016-01-06 LAB — MRSA PCR SCREENING: MRSA BY PCR: NEGATIVE

## 2016-01-06 LAB — MAGNESIUM: Magnesium: 2.1 mg/dL (ref 1.7–2.4)

## 2016-01-06 LAB — I-STAT TROPONIN, ED: Troponin i, poc: 0.22 ng/mL (ref 0.00–0.08)

## 2016-01-06 LAB — HEPARIN LEVEL (UNFRACTIONATED): HEPARIN UNFRACTIONATED: 0.3 [IU]/mL (ref 0.30–0.70)

## 2016-01-06 MED ORDER — HEPARIN BOLUS VIA INFUSION
4000.0000 [IU] | Freq: Once | INTRAVENOUS | Status: AC
  Administered 2016-01-06: 4000 [IU] via INTRAVENOUS
  Filled 2016-01-06: qty 4000

## 2016-01-06 MED ORDER — ATORVASTATIN CALCIUM 20 MG PO TABS
20.0000 mg | ORAL_TABLET | Freq: Every day | ORAL | Status: DC
  Administered 2016-01-07: 20 mg via ORAL
  Filled 2016-01-06: qty 1

## 2016-01-06 MED ORDER — ASPIRIN 81 MG PO CHEW
81.0000 mg | CHEWABLE_TABLET | ORAL | Status: AC
  Administered 2016-01-07: 81 mg via ORAL
  Filled 2016-01-06: qty 1

## 2016-01-06 MED ORDER — CLOPIDOGREL BISULFATE 75 MG PO TABS
75.0000 mg | ORAL_TABLET | Freq: Every day | ORAL | Status: DC
  Administered 2016-01-07: 75 mg via ORAL
  Filled 2016-01-06: qty 1

## 2016-01-06 MED ORDER — SODIUM CHLORIDE 0.9% FLUSH: 3.0000 mL | INTRAVENOUS | Status: DC | PRN

## 2016-01-06 MED ORDER — ONDANSETRON HCL 4 MG/2ML IJ SOLN
4.0000 mg | Freq: Four times a day (QID) | INTRAMUSCULAR | Status: DC | PRN
  Administered 2016-01-07 (×2): 4 mg via INTRAVENOUS
  Filled 2016-01-06: qty 2

## 2016-01-06 MED ORDER — SODIUM CHLORIDE 0.9 % IV SOLN: 250.0000 mL | INTRAVENOUS | Status: DC | PRN

## 2016-01-06 MED ORDER — SODIUM CHLORIDE 0.9 % IV SOLN: INTRAVENOUS | Status: DC

## 2016-01-06 MED ORDER — ACETAMINOPHEN 325 MG PO TABS
650.0000 mg | ORAL_TABLET | ORAL | Status: DC | PRN
  Administered 2016-01-07 (×2): 650 mg via ORAL
  Filled 2016-01-06 (×2): qty 2

## 2016-01-06 MED ORDER — VITAMIN D (ERGOCALCIFEROL) 1.25 MG (50000 UNIT) PO CAPS
50000.0000 [IU] | ORAL_CAPSULE | ORAL | Status: DC
  Administered 2016-01-07: 50000 [IU] via ORAL
  Filled 2016-01-06: qty 1

## 2016-01-06 MED ORDER — ASPIRIN 81 MG PO CHEW: 324.0000 mg | CHEWABLE_TABLET | ORAL | Status: AC

## 2016-01-06 MED ORDER — CLONAZEPAM 0.5 MG PO TABS
1.0000 mg | ORAL_TABLET | Freq: Every day | ORAL | Status: DC
  Administered 2016-01-06 – 2016-01-07 (×2): 1 mg via ORAL
  Filled 2016-01-06 (×2): qty 2

## 2016-01-06 MED ORDER — ASPIRIN EC 81 MG PO TBEC
81.0000 mg | DELAYED_RELEASE_TABLET | Freq: Every day | ORAL | Status: DC
  Administered 2016-01-07 – 2016-01-08 (×2): 81 mg via ORAL
  Filled 2016-01-06 (×2): qty 1

## 2016-01-06 MED ORDER — SODIUM CHLORIDE 0.9% FLUSH
3.0000 mL | Freq: Two times a day (BID) | INTRAVENOUS | Status: DC
  Administered 2016-01-06 – 2016-01-07 (×2): 3 mL via INTRAVENOUS

## 2016-01-06 MED ORDER — SODIUM CHLORIDE 0.9 % WEIGHT BASED INFUSION
1.0000 mL/kg/h | INTRAVENOUS | Status: DC
  Administered 2016-01-06 – 2016-01-07 (×2): 1 mL/kg/h via INTRAVENOUS

## 2016-01-06 MED ORDER — IOPAMIDOL (ISOVUE-370) INJECTION 76%
INTRAVENOUS | Status: AC
  Administered 2016-01-06: 80 mL
  Filled 2016-01-06: qty 100

## 2016-01-06 MED ORDER — ASPIRIN 81 MG PO CHEW
324.0000 mg | CHEWABLE_TABLET | Freq: Once | ORAL | Status: AC
  Administered 2016-01-06: 324 mg via ORAL
  Filled 2016-01-06: qty 4

## 2016-01-06 MED ORDER — HEPARIN (PORCINE) IN NACL 100-0.45 UNIT/ML-% IJ SOLN
1150.0000 [IU]/h | INTRAMUSCULAR | Status: DC
  Administered 2016-01-06: 1000 [IU]/h via INTRAVENOUS
  Filled 2016-01-06 (×2): qty 250

## 2016-01-06 MED ORDER — METOPROLOL TARTRATE 25 MG PO TABS
25.0000 mg | ORAL_TABLET | Freq: Two times a day (BID) | ORAL | Status: DC
  Administered 2016-01-06 – 2016-01-08 (×4): 25 mg via ORAL
  Filled 2016-01-06 (×4): qty 1

## 2016-01-06 MED ORDER — NITROGLYCERIN IN D5W 200-5 MCG/ML-% IV SOLN
5.0000 ug/min | INTRAVENOUS | Status: DC
  Administered 2016-01-06: 5 ug/min via INTRAVENOUS
  Filled 2016-01-06: qty 250

## 2016-01-06 MED ORDER — NITROGLYCERIN 0.4 MG SL SUBL: 0.4000 mg | SUBLINGUAL_TABLET | SUBLINGUAL | Status: DC | PRN

## 2016-01-06 MED ORDER — ALLOPURINOL 300 MG PO TABS
300.0000 mg | ORAL_TABLET | Freq: Every day | ORAL | Status: DC
  Administered 2016-01-07: 300 mg via ORAL
  Filled 2016-01-06: qty 1

## 2016-01-06 MED ORDER — ASPIRIN 300 MG RE SUPP: 300.0000 mg | RECTAL | Status: AC

## 2016-01-06 MED ORDER — LEVOTHYROXINE SODIUM 25 MCG PO TABS
125.0000 ug | ORAL_TABLET | Freq: Every day | ORAL | Status: DC
  Administered 2016-01-07 – 2016-01-08 (×2): 125 ug via ORAL
  Filled 2016-01-06 (×2): qty 1

## 2016-01-06 NOTE — ED Provider Notes (Signed)
[facility]-EMERGENCY DEPT Provider Note   CSN: [id number] Arrival date & time: 01/06/16  1232     History   Chief Complaint Chief Complaint  Patient presents with  . Chest Pain    HPI Caitlyn [name] is a 65 y.o. female.  HPI Pt reports x2 weeks of intermittent 8/10 centralized cp that radiates to her back. Pt states she was seen by her cardiologist last week and had a stress test, ekg, and blood work which she states were all normal results. Pt was not Rx'd any cp medication. Pt denies n/v. Pt denies hx GERD or Acid Reflux.  Past Medical History:  Diagnosis Date  . Anxiety   . Arthritis    "legs" (01/07/2016)  . CAD 06/2009   a.  s/p MI 4/11: tx with DES x 2 to RCA;  b.  LHC 03/17/11: LAD 30-40%, distal LAD 30-40%, OM2 40-50%, RCA stent patent, EF greater than 70%.;  c.  Lexiscan Myoview (12/15):  Mild apical thinning, no ischemia, EF 61%; normal study  . CAROTID STENOSIS    carotid Dopplers 03/25/11: RICA 60-79%; LICA 0-39%.  Follow up recommended in 6 months.  . Depression   . Gout    "on RX for it" (01/07/2016)  . HYPERLIPIDEMIA   . HYPERTENSION   . HYPOTHYROIDISM    post ablation tx Graves  . Myocardial infarction 2012   "mini"  . OSA on CPAP   . PVD   . TOBACCO ABUSE quit 06/2009  . Varicose veins   . VITAMIN D DEFICIENCY    DEXA 04/2009 normal    Patient Active Problem List   Diagnosis Date Noted  . Acute coronary syndrome (HCC) 01/06/2016  . Edema 03/16/2013  . Gout 03/16/2013  . Arthralgia of ankle 03/16/2013  . Depression   . Myalgia 11/11/2011  . Leg pain 07/31/2011  . Urinary tract infection 03/18/2011  . VITAMIN D DEFICIENCY 03/15/2010  . TOBACCO ABUSE 03/15/2010  . APHTHOUS ULCERS 03/15/2010  . CAROTID STENOSIS 07/24/2009  . HYPOTHYROIDISM 07/23/2009  . HYPERLIPIDEMIA 07/23/2009  . HYPERTENSION 07/23/2009  . CAD 07/23/2009  . PVD 07/23/2009  . GRAVES' DISEASE, HX OF 07/23/2009    Past Surgical History:  Procedure Laterality Date  . CARDIAC  CATHETERIZATION N/A 01/07/2016   Procedure: Left Heart Cath and Coronary Angiography;  Surgeon: Rinaldo Cloud, MD;  Location: [facility] INVASIVE CV LAB;  Service: Cardiovascular;  Laterality: N/A;  . CARDIAC CATHETERIZATION N/A 01/07/2016   Procedure: Coronary Stent Intervention;  Surgeon: Rinaldo Cloud, MD;  Location: [facility] INVASIVE CV LAB;  Service: Cardiovascular;  Laterality: N/A;  . CAROTID STENT Left    bilateral carotid artery disease post stent of left carotid  . CESAREAN SECTION  1985   "twins"  . CORONARY ANGIOPLASTY    . LEFT HEART CATHETERIZATION WITH CORONARY ANGIOGRAM N/A 03/17/2011   Procedure: LEFT HEART CATHETERIZATION WITH CORONARY ANGIOGRAM;  Surgeon: Herby Abraham, MD;  Location: [facility] CATH LAB;  Service: Cardiovascular;  Laterality: N/A;  . PERIPHERAL VASCULAR CATHETERIZATION N/A 04/06/2015   Procedure: Abdominal Aortogram;  Surgeon: Sherren Kerns, MD;  Location: [facility] INVASIVE CV LAB;  Service: Cardiovascular;  Laterality: N/A;  . RADIOACTIVE PLAQUE INSERTION     Graves disease post radioactive treatment complicated hypothyroidism    OB History    No data available       Home Medications    Prior to Admission medications   Medication Sig Start Date End Date Taking? Authorizing Provider  acetaminophen (TYLENOL) 500 MG tablet  Take 1,000 mg by mouth every 6 (six) hours as needed for moderate pain.   Yes Historical Provider, MD  albuterol (PROVENTIL HFA;VENTOLIN HFA) 108 (90 BASE) MCG/ACT inhaler Inhale 2 puffs into the lungs every 6 (six) hours as needed for wheezing or shortness of breath.    Yes Historical Provider, MD  allopurinol (ZYLOPRIM) 100 MG tablet Take 1 tablet (100 mg total) by mouth daily. Patient taking differently: Take 300 mg by mouth daily.  08/22/14  Yes Newt LukesValerie A Leschber, MD  aspirin 81 MG tablet Take 81 mg by mouth daily. Reported on 04/04/2015   Yes Historical Provider, MD  atorvastatin (LIPITOR) 20 MG tablet Take 20 mg by mouth daily.  08/31/15  Yes  Historical Provider, MD  clonazePAM (KLONOPIN) 1 MG tablet Take 1 mg by mouth at bedtime.  10/05/15  Yes Historical Provider, MD  colchicine (COLCRYS) 0.6 MG tablet Take 0.6 mg by mouth daily as needed (for flare ups). Flare ups 07/19/12  Yes Newt LukesValerie A Leschber, MD  fluticasone (FLONASE) 50 MCG/ACT nasal spray Place 2 sprays into both nostrils daily as needed for allergies or rhinitis.    Yes Historical Provider, MD  levothyroxine (SYNTHROID, LEVOTHROID) 125 MCG tablet Take 125 mcg by mouth daily before breakfast.   Yes Historical Provider, MD  lisinopril (PRINIVIL,ZESTRIL) 40 MG tablet Take 1 tablet (40 mg total) by mouth daily. Patient needs appointment with new PCP for further refills 05/17/15  Yes Shirlean Mylararol Webb, MD  methocarbamol (ROBAXIN) 500 MG tablet Take 500 mg by mouth every 8 (eight) hours as needed for muscle spasms.  12/03/15  Yes Historical Provider, MD  metoprolol succinate (TOPROL-XL) 25 MG 24 hr tablet Take 50 mg by mouth daily.  02/28/15  Yes Historical Provider, MD  nitroGLYCERIN (NITROSTAT) 0.4 MG SL tablet Place 0.4 mg under the tongue every 5 (five) minutes as needed for chest pain.    Yes Historical Provider, MD  Pediatric Multivit-Minerals-C (GUMMI BEAR MULTIVITAMIN/MIN) CHEW Chew 2 tablets by mouth daily.   Yes Historical Provider, MD  Vitamin D, Ergocalciferol, (DRISDOL) 50000 units CAPS capsule Take 50,000 Units by mouth every Monday.   Yes Historical Provider, MD  levothyroxine (SYNTHROID, LEVOTHROID) 112 MCG tablet Take 1 tablet (112 mcg total) by mouth daily before breakfast. Patient not taking: Reported on 01/06/2016 05/26/14   Newt LukesValerie A Leschber, MD  ticagrelor (BRILINTA) 90 MG TABS tablet Take 1 tablet (90 mg total) by mouth 2 (two) times daily. 01/08/16   Rinaldo CloudMohan Harwani, MD  zolpidem (AMBIEN) 10 MG tablet Take 1 tablet (10 mg total) by mouth at bedtime as needed for sleep. Patient not taking: Reported on 01/06/2016 05/26/14   Newt LukesValerie A Leschber, MD    Family History Family  History  Problem Relation Age of Onset  . Heart attack Mother   . Stroke Mother   . Cancer Mother   . Varicose Veins Mother   . Cancer Father   . Coronary artery disease Other   . Hypertension Other   . Hypertension Sister     Social History Social History  Substance Use Topics  . Smoking status: Former Smoker    Packs/day: 1.00    Years: 35.00    Quit date: 06/30/2009  . 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  Divorced, single, lives alone- 2 grown dtr nearby, 3 g-kds  . Alcohol use 1.2 oz/week    2 Glasses of wine per week  Allergies   Amlodipine and Statins   Review of Systems Review of Systems  All other systems reviewed and are negative.    Physical Exam Updated Vital Signs BP (!) 170/58 (BP Location: Left Arm)   Pulse 66   Temp 98.1 F (36.7 C) (Oral)   Resp 14   Ht 5\' 6"  (1.676 m)   Wt 273 lb 2.4 oz (123.9 kg)   SpO2 96%   BMI 44.09 kg/m   Physical Exam  Constitutional: She is oriented to person, place, and time. She appears well-developed and well-nourished. No distress.  HENT:  Head: Normocephalic and atraumatic.  Eyes: Pupils are equal, round, and reactive to light.  Neck: Normal range of motion.  Cardiovascular: Normal rate and intact distal pulses.   Pulmonary/Chest: Effort normal and breath sounds normal. No respiratory distress.  Abdominal: Soft. Normal appearance and bowel sounds are normal. She exhibits no distension.  Musculoskeletal: Normal range of motion.  Neurological: She is alert and oriented to person, place, and time. No cranial nerve deficit.  Skin: Skin is warm and dry. No rash noted.  Psychiatric: She has a normal mood and affect. Her behavior is normal.  Nursing note and vitals reviewed.    ED Treatments / Results  Labs (all labs ordered are listed, but only abnormal results are displayed) Labs Reviewed  BASIC METABOLIC PANEL - Abnormal;  Notable for the following:       Result Value   BUN 24 (*)    Creatinine, Ser 1.07 (*)    GFR calc non Af Amer 54 (*)    All other components within normal limits  COMPREHENSIVE METABOLIC PANEL - Abnormal; Notable for the following:    BUN 22 (*)    Anion gap 4 (*)    All other components within normal limits  TROPONIN I - Abnormal; Notable for the following:    Troponin I 0.24 (*)    All other components within normal limits  TROPONIN I - Abnormal; Notable for the following:    Troponin I 0.22 (*)    All other components within normal limits  LIPID PANEL - Abnormal; Notable for the following:    LDL Cholesterol 108 (*)    All other components within normal limits  BASIC METABOLIC PANEL - Abnormal; Notable for the following:    Chloride 112 (*)    Calcium 8.7 (*)    All other components within normal limits  BASIC METABOLIC PANEL - Abnormal; Notable for the following:    Calcium 8.7 (*)    All other components within normal limits  I-STAT TROPOININ, ED - Abnormal; Notable for the following:    Troponin i, poc 0.22 (*)    All other components within normal limits  MRSA PCR SCREENING  CBC  APTT  PROTIME-INR  MAGNESIUM  CBC WITH DIFFERENTIAL/PLATELET  HEPARIN LEVEL (UNFRACTIONATED)  HEPARIN LEVEL (UNFRACTIONATED)  CBC  CBC  PROTIME-INR  CBC  POCT ACTIVATED CLOTTING TIME    EKG  EKG Interpretation  Date/Time:  Sunday January 06 2016 12:44:26 EDT Ventricular Rate:  58 PR Interval:    QRS Duration: 96 QT Interval:  440 QTC Calculation: 433 R Axis:   12 Text Interpretation:  Sinus rhythm Left atrial enlargement Nonspecific T abnormalities, diffuse leads Abnormal ekg Confirmed by Caily Rakers  MD, Hearl Heikes ([id number]) on 01/06/2016 1:38:06 PM       Radiology No results found.  Procedures Procedures (including critical care time)  Medications Ordered in ED Medications  aspirin chewable tablet 324  mg ( Oral MAR Unhold 01/07/16 1341)    Or  aspirin suppository 300 mg (  Rectal MAR Unhold 01/07/16 1341)  0.9 %  sodium chloride infusion ( Intravenous Rate/Dose Verify 01/07/16 2000)  atropine 1 MG/10ML injection (  Not Given 01/07/16 1841)  aspirin chewable tablet 324 mg (324 mg Oral Given 01/06/16 1426)  heparin bolus via infusion 4,000 Units (4,000 Units Intravenous Bolus from Bag 01/06/16 1535)  aspirin chewable tablet 81 mg (81 mg Oral Given 01/07/16 0645)  iopamidol (ISOVUE-370) 76 % injection (80 mLs  Contrast Given 01/06/16 2138)  ondansetron (ZOFRAN) injection 4 mg (0 mg Intravenous Duplicate 01/07/16 1842)  angioplasty book ( Does not apply Given 01/07/16 2015)     Initial Impression / Assessment and Plan / ED Course  I have reviewed the triage vital signs and the nursing notes.  Pertinent labs & imaging results that were available during my care of the patient were reviewed by me and considered in my medical decision making (see chart for details).  Clinical Course      Final Clinical Impressions(s) / ED Diagnoses   Final diagnoses:  Chest pain  Status post coronary artery stent placement    New Prescriptions Discharge Medication List as of 01/08/2016 10:44 AM    START taking these medications   Details  ticagrelor (BRILINTA) 90 MG TABS tablet Take 1 tablet (90 mg total) by mouth 2 (two) times daily., Starting Tue 01/08/2016, Normal         Nelva Nay, MD 01/13/16 2040

## 2016-01-06 NOTE — ED Triage Notes (Signed)
Pt reports x2 weeks of intermittent 8/10 centralized cp that radiates to her back. Pt states she was seen by her cardiologist last week and had a stress test, ekg, and blood work which she states were all normal results. Pt was not Rx'd any cp medication. Pt denies n/v. Pt denies hx GERD or Acid Reflux.

## 2016-01-06 NOTE — Progress Notes (Signed)
CRITICAL VALUE ALERT  Critical value received:  Troponin 0.24  Date of notification:  01/06/2016  Time of notification:  1723  Critical value read back:Yes.    Nurse who received alert:  Sherene SiresBettie Jo Savalas Monje, RN  MD notified (1st page):  Harwani  Time of first page:  1729  MD notified (2nd page):  Time of second page:  Responding MD:  Sharyn LullHarwani  Time MD responded:  [phone]

## 2016-01-06 NOTE — H&P (Signed)
Caitlyn [name] is an 65 y.o. female.   Chief Complaint: Recurrent chest pain HPI: Patient is 65 year old female with past medical history significant for coronary artery disease history of non-Q-wave MI in the past status post PCI to RCA in the past, hypertension, hyperlipidemia, hypothyroidism, peripheral vascular disease status post left carotid stenting in the past, history of tobacco abuse quit her 2012, vitamin D deficiency, history of varicose veins, came to the ER complaining of recurrent retrosternal chest pain described as dull aching/nagging radiating to back grade 8/10 lasting 20-30 minutes patient denies any nausea vomiting diaphoresis and shortness of breath denies palpitation lightheadedness or syncope EKG done in the ED showed normal sinus rhythm with minor ST-T wave changes in inferior leads and was noted to have troponin I of 0.22. Patient recently had nuclear stress test approximately 2 weeks ago which showed no evidence of reversible ischemia. States occasional chest pain feels better while sitting up but denies any relation of chest pain to food breathing or movement. Denies any cough cold or flu recently.  Past Medical History:  Diagnosis Date  . CAD 06/2009   a.  s/p MI 4/11: tx with DES x 2 to RCA;  b.  LHC 03/17/11: LAD 30-40%, distal LAD 30-40%, OM2 40-50%, RCA stent patent, EF greater than 70%.;  c.  Lexiscan Myoview (12/15):  Mild apical thinning, no ischemia, EF 61%; normal study  . CAROTID STENOSIS    carotid Dopplers 08/17/76: RICA 58-85%; LICA 0-27%.  Follow up recommended in 6 months.  Marland Kitchen HYPERLIPIDEMIA   . HYPERTENSION   . HYPOTHYROIDISM    post ablation tx Graves  . PVD   . TOBACCO ABUSE quit 06/2009  . Varicose veins   . VITAMIN D DEFICIENCY    DEXA 04/2009 normal    Past Surgical History:  Procedure Laterality Date  . CAROTID STENT     bilateral carotid artery disease post stent of left carotid  . LEFT HEART CATHETERIZATION WITH CORONARY ANGIOGRAM N/A  03/17/2011   Procedure: LEFT HEART CATHETERIZATION WITH CORONARY ANGIOGRAM;  Surgeon: Hillary Bow, MD;  Location: [facility] CATH LAB;  Service: Cardiovascular;  Laterality: N/A;  . PERIPHERAL VASCULAR CATHETERIZATION N/A 04/06/2015   Procedure: Abdominal Aortogram;  Surgeon: Elam Dutch, MD;  Location: [facility] CV LAB;  Service: Cardiovascular;  Laterality: N/A;  . RADIOACTIVE PLAQUE INSERTION     Graves disease post radioactive treatment complicated hypothyroidism    Family History  Problem Relation Age of Onset  . Coronary artery disease Other   . Hypertension Other   . Heart attack Mother   . Stroke Mother   . Cancer Mother   . Varicose Veins Mother   . Hypertension Sister   . Cancer Father    Social History:  reports that she quit smoking about 6 years ago. She has never used smokeless tobacco. She reports that she does not drink alcohol. Her drug history is not on file.  Allergies:  Allergies  Allergen Reactions  . Amlodipine Swelling  . Statins Other (See Comments)    myalgia     (Not in a hospital admission)  Results for orders placed or performed during the hospital encounter of 01/06/16 (from the past 48 hour(s))  Basic metabolic panel     Status: Abnormal   Collection Time: 01/06/16 12:48 PM  Result Value Ref Range   Sodium 141 135 - 145 mmol/L   Potassium 4.2 3.5 - 5.1 mmol/L   Chloride 110 101 - 111 mmol/L  CO2 25 22 - 32 mmol/L   Glucose, Bld 82 65 - 99 mg/dL   BUN 24 (H) 6 - 20 mg/dL   Creatinine, Ser 1.07 (H) 0.44 - 1.00 mg/dL   Calcium 8.9 8.9 - 10.3 mg/dL   GFR calc non Af Amer 54 (L) >60 mL/min   GFR calc Af Amer >60 >60 mL/min    Comment: (NOTE) The eGFR has been calculated using the CKD EPI equation. This calculation has not been validated in all clinical situations. eGFR's persistently <60 mL/min signify possible Chronic Kidney Disease.    Anion gap 6 5 - 15  CBC     Status: None   Collection Time: 01/06/16 12:48 PM  Result Value Ref  Range   WBC 6.2 4.0 - 10.5 K/uL   RBC 4.47 3.87 - 5.11 MIL/uL   Hemoglobin 13.8 12.0 - 15.0 g/dL   HCT 42.5 36.0 - 46.0 %   MCV 95.1 78.0 - 100.0 fL   MCH 30.9 26.0 - 34.0 pg   MCHC 32.5 30.0 - 36.0 g/dL   RDW 14.6 11.5 - 15.5 %   Platelets 224 150 - 400 K/uL  I-stat troponin, ED     Status: Abnormal   Collection Time: 01/06/16 12:56 PM  Result Value Ref Range   Troponin i, poc 0.22 (HH) 0.00 - 0.08 ng/mL   Comment NOTIFIED PHYSICIAN    Comment 3            Comment: Due to the release kinetics of cTnI, a negative result within the first hours of the onset of symptoms does not rule out myocardial infarction with certainty. If myocardial infarction is still suspected, repeat the test at appropriate intervals.    Dg Chest Portable 1 View  Result Date: 01/06/2016 CLINICAL DATA:  Intermittent chest pain EXAM: PORTABLE CHEST 1 VIEW COMPARISON:  12/21/2013 FINDINGS: Cardiomegaly is noted. No acute infiltrate or pleural effusion. No pulmonary edema. Mild mid thoracic dextroscoliosis. IMPRESSION: No active disease. Electronically Signed   By: Lahoma Crocker M.D.   On: 01/06/2016 14:26    Review of Systems  Eyes: Negative for blurred vision.  Respiratory: Negative for cough and shortness of breath.   Cardiovascular: Positive for chest pain. Negative for claudication and leg swelling.  Gastrointestinal: Negative for nausea.  Skin: Negative for rash.  Neurological: Negative for headaches.    Blood pressure 154/88, pulse (!) 54, temperature 97.8 F (36.6 C), temperature source Oral, resp. rate 16, height '5\' 5"'  (1.651 m), weight 265 lb (120.2 kg), SpO2 100 %. Physical Exam  Constitutional: She is oriented to person, place, and time.  HENT:  Head: Normocephalic.  Eyes: Pupils are equal, round, and reactive to light. Left eye exhibits no discharge. No scleral icterus.  Neck: No JVD present. No tracheal deviation present. No thyromegaly present.  Cardiovascular: Regular rhythm.   Murmur  (Soft systolic murmur and S4 gallop noted) heard. Respiratory: Effort normal and breath sounds normal. She has no wheezes.  GI: Bowel sounds are normal.  Musculoskeletal: Normal range of motion. She exhibits no edema or deformity.  Neurological: She is alert and oriented to person, place, and time.     Assessment/Plan Acute coronary syndrome Coronary artery disease history of non-Q-wave MI in the past X Hypertension Hyperlipidemia Morbid obesity Peripheral vascular disease status post left common carotid stenting in the past History of tobacco abuse Vitamin D deficiency History of varicose veins Plan As per orders Discussed with patient at length regarding left cardiac catheterization possible PTCA  stenting its risk and benefit i.e. death MI stroke need for emergency CABG local vascular complications etc. and consents for PCI  Charolette Forward, MD 01/06/2016, 3:17 PM

## 2016-01-06 NOTE — ED Notes (Signed)
Bed: [id number] Expected date:  Expected time:  Means of arrival:  Comments: Hold TR1

## 2016-01-06 NOTE — Progress Notes (Signed)
ANTICOAGULATION CONSULT NOTE - Initial Consult  Pharmacy Consult for Heparin Indication: chest pain/ACS  Allergies  Allergen Reactions  . Amlodipine Swelling  . Statins Other (See Comments)    myalgia    Patient Measurements: Height: 5\' 5"  (165.1 cm) Weight: 265 lb (120.2 kg) IBW/kg (Calculated) : 57 Heparin Dosing Weight: 85.9 kg  Vital Signs: Temp: 97.8 F (36.6 C) (10/22 1245) Temp Source: Oral (10/22 1245) BP: 179/69 (10/22 1543) Pulse Rate: 60 (10/22 1543)  Labs:  Recent Labs  01/06/16 1248 01/06/16 1255  HGB 13.8  --   HCT 42.5  --   PLT 224  --   APTT  --  29  LABPROT  --  13.0  INR  --  0.98  CREATININE 1.07*  --     Estimated Creatinine Clearance: 70.8 mL/min (by C-G formula based on SCr of 1.07 mg/dL (H)).   Medical History: Past Medical History:  Diagnosis Date  . CAD 06/2009   a.  s/p MI 4/11: tx with DES x 2 to RCA;  b.  LHC 03/17/11: LAD 30-40%, distal LAD 30-40%, OM2 40-50%, RCA stent patent, EF greater than 70%.;  c.  Lexiscan Myoview (12/15):  Mild apical thinning, no ischemia, EF 61%; normal study  . CAROTID STENOSIS    carotid Dopplers 03/25/11: RICA 60-79%; LICA 0-39%.  Follow up recommended in 6 months.  Marland Kitchen. HYPERLIPIDEMIA   . HYPERTENSION   . HYPOTHYROIDISM    post ablation tx Graves  . PVD   . TOBACCO ABUSE quit 06/2009  . Varicose veins   . VITAMIN D DEFICIENCY    DEXA 04/2009 normal    Medications:  Scheduled:  . allopurinol  300 mg Oral Daily  . aspirin  324 mg Oral NOW   Or  . aspirin  300 mg Rectal NOW  . [START ON 01/07/2016] aspirin EC  81 mg Oral Daily  . atorvastatin  20 mg Oral Daily  . clonazePAM  1 mg Oral QHS  . clopidogrel  75 mg Oral Daily  . [START ON 01/07/2016] levothyroxine  125 mcg Oral QAC breakfast  . metoprolol tartrate  25 mg Oral BID  . [START ON 01/07/2016] Vitamin D (Ergocalciferol)  50,000 Units Oral Q Mon   Infusions:  . sodium chloride    . heparin 1,000 Units/hr (01/06/16 1534)  .  nitroGLYCERIN     PRN: acetaminophen, nitroGLYCERIN, ondansetron (ZOFRAN) IV  Assessment: 65 yo female with Hx CAD s/p CAD with DES x 2 who presents with 2 week hx intermittent CP. Had stress test last week which she reports were all normal. EKG shows nonspecific T wave abnormalities, troponin 0.22.  Aspirin 324mg  given and Cardiology consulted. EDP started IV heparin (4000 units IV bolus, then 1000 units/hr), then changed to per Rx on admit.  Goal of Therapy:  Heparin level 0.3-0.7 units/ml Monitor platelets by anticoagulation protocol: Yes   Plan:   Check heparin level 6hr after heparin started  Daily heparin level and CBC  Loralee PacasErin Robbie Nangle, PharmD, BCPS Pager: [phone] 01/06/2016,3:46 PM

## 2016-01-06 NOTE — ED Notes (Signed)
Abnormal lab result MD beaton have been made aware

## 2016-01-07 ENCOUNTER — Other Ambulatory Visit (HOSPITAL_COMMUNITY): Payer: 59

## 2016-01-07 ENCOUNTER — Encounter (HOSPITAL_COMMUNITY)
Admission: EM | Disposition: A | Discharge status: Home / Self Care | Payer: Self-pay | Source: Home / Self Care | Attending: Cardiology

## 2016-01-07 ENCOUNTER — Encounter (HOSPITAL_COMMUNITY): Payer: Self-pay | Admitting: Cardiology

## 2016-01-07 ENCOUNTER — Inpatient Hospital Stay (HOSPITAL_COMMUNITY): Payer: 59

## 2016-01-07 HISTORY — PX: CARDIAC CATHETERIZATION: SHX172

## 2016-01-07 LAB — BASIC METABOLIC PANEL
Anion gap: 7 (ref 5–15)
BUN: 12 mg/dL (ref 6–20)
CALCIUM: 8.7 mg/dL — AB (ref 8.9–10.3)
CO2: 23 mmol/L (ref 22–32)
CREATININE: 0.75 mg/dL (ref 0.44–1.00)
Chloride: 112 mmol/L — ABNORMAL HIGH (ref 101–111)
Glucose, Bld: 91 mg/dL (ref 65–99)
Potassium: 4 mmol/L (ref 3.5–5.1)
SODIUM: 142 mmol/L (ref 135–145)

## 2016-01-07 LAB — CBC
HCT: 41 % (ref 36.0–46.0)
HEMATOCRIT: 38.5 % (ref 36.0–46.0)
HEMOGLOBIN: 12.3 g/dL (ref 12.0–15.0)
HEMOGLOBIN: 13.1 g/dL (ref 12.0–15.0)
MCH: 29.9 pg (ref 26.0–34.0)
MCH: 29.7 pg (ref 26.0–34.0)
MCHC: 31.9 g/dL (ref 30.0–36.0)
MCHC: 32 g/dL (ref 30.0–36.0)
MCV: 93.7 fL (ref 78.0–100.0)
MCV: 93 fL (ref 78.0–100.0)
Platelets: 217 10*3/uL (ref 150–400)
Platelets: 205 10*3/uL (ref 150–400)
RBC: 4.11 MIL/uL (ref 3.87–5.11)
RBC: 4.41 MIL/uL (ref 3.87–5.11)
RDW: 14.3 % (ref 11.5–15.5)
RDW: 14.2 % (ref 11.5–15.5)
WBC: 8.3 10*3/uL (ref 4.0–10.5)
WBC: 6.7 10*3/uL (ref 4.0–10.5)

## 2016-01-07 LAB — HEPARIN LEVEL (UNFRACTIONATED): HEPARIN UNFRACTIONATED: 0.39 [IU]/mL (ref 0.30–0.70)

## 2016-01-07 LAB — LIPID PANEL
CHOL/HDL RATIO: 3.5 ratio
Cholesterol: 176 mg/dL (ref 0–200)
HDL: 51 mg/dL (ref >40)
LDL CALC: 108 mg/dL — AB (ref 0–99)
Triglycerides: 85 mg/dL (ref <150)
VLDL: 17 mg/dL (ref 0–40)

## 2016-01-07 LAB — PROTIME-INR
INR: 1.08
PROTHROMBIN TIME: 14 s (ref 11.4–15.2)

## 2016-01-07 LAB — POCT ACTIVATED CLOTTING TIME: ACTIVATED CLOTTING TIME: 417 s

## 2016-01-07 LAB — TROPONIN I: TROPONIN I: 0.22 ng/mL — AB (ref <0.03)

## 2016-01-07 SURGERY — LEFT HEART CATH AND CORONARY ANGIOGRAPHY

## 2016-01-07 MED ORDER — OXYCODONE-ACETAMINOPHEN 5-325 MG PO TABS
1.0000 | ORAL_TABLET | ORAL | Status: DC | PRN
  Administered 2016-01-07: 14:00:00 2 via ORAL
  Filled 2016-01-07: qty 2

## 2016-01-07 MED ORDER — FENTANYL CITRATE (PF) 100 MCG/2ML IJ SOLN
INTRAMUSCULAR | Status: DC | PRN
  Administered 2016-01-07 (×3): 25 ug via INTRAVENOUS

## 2016-01-07 MED ORDER — TICAGRELOR 90 MG PO TABS
90.0000 mg | ORAL_TABLET | Freq: Two times a day (BID) | ORAL | Status: DC
  Administered 2016-01-08 (×2): 90 mg via ORAL
  Filled 2016-01-07 (×2): qty 1

## 2016-01-07 MED ORDER — MIDAZOLAM HCL 2 MG/2ML IJ SOLN
INTRAMUSCULAR | Status: AC
  Filled 2016-01-07: qty 2

## 2016-01-07 MED ORDER — HYDRALAZINE HCL 20 MG/ML IJ SOLN
10.0000 mg | Freq: Four times a day (QID) | INTRAMUSCULAR | Status: DC | PRN
  Administered 2016-01-07: 10 mg via INTRAVENOUS
  Filled 2016-01-07: qty 1

## 2016-01-07 MED ORDER — SODIUM CHLORIDE 0.9% FLUSH
3.0000 mL | Freq: Two times a day (BID) | INTRAVENOUS | Status: DC
  Administered 2016-01-07: 3 mL via INTRAVENOUS

## 2016-01-07 MED ORDER — HEPARIN (PORCINE) IN NACL 2-0.9 UNIT/ML-% IJ SOLN
INTRAMUSCULAR | Status: DC | PRN
  Administered 2016-01-07: 12:00:00

## 2016-01-07 MED ORDER — TICAGRELOR 90 MG PO TABS
ORAL_TABLET | ORAL | Status: DC | PRN
  Administered 2016-01-07: 180 mg via ORAL

## 2016-01-07 MED ORDER — TICAGRELOR 90 MG PO TABS
ORAL_TABLET | ORAL | Status: AC
  Filled 2016-01-07: qty 2

## 2016-01-07 MED ORDER — SODIUM CHLORIDE 0.9 % IV SOLN
INTRAVENOUS | Status: AC
  Administered 2016-01-07: 14:00:00 via INTRAVENOUS

## 2016-01-07 MED ORDER — BIVALIRUDIN 250 MG IV SOLR
INTRAVENOUS | Status: AC
  Filled 2016-01-07: qty 250

## 2016-01-07 MED ORDER — HEPARIN (PORCINE) IN NACL 2-0.9 UNIT/ML-% IJ SOLN
INTRAMUSCULAR | Status: AC
  Filled 2016-01-07: qty 1000

## 2016-01-07 MED ORDER — BIVALIRUDIN BOLUS VIA INFUSION - CUPID
INTRAVENOUS | Status: DC | PRN
  Administered 2016-01-07: 96.15 mg via INTRAVENOUS

## 2016-01-07 MED ORDER — BIVALIRUDIN 250 MG IV SOLR
INTRAVENOUS | Status: DC | PRN
  Administered 2016-01-07: 13:00:00
  Administered 2016-01-07: 1.75 mg/kg/h via INTRAVENOUS

## 2016-01-07 MED ORDER — IOPAMIDOL (ISOVUE-370) INJECTION 76%
INTRAVENOUS | Status: DC | PRN
  Administered 2016-01-07: 110 mL

## 2016-01-07 MED ORDER — NITROGLYCERIN 1 MG/10 ML FOR IR/CATH LAB
INTRA_ARTERIAL | Status: DC | PRN
  Administered 2016-01-07 (×2): 150 ug via INTRACORONARY
  Administered 2016-01-07: 200 ug via INTRACORONARY
  Administered 2016-01-07: 150 ug via INTRACORONARY
  Administered 2016-01-07: 200 ug via INTRACORONARY

## 2016-01-07 MED ORDER — ALPRAZOLAM 0.25 MG PO TABS
0.2500 mg | ORAL_TABLET | Freq: Two times a day (BID) | ORAL | Status: DC | PRN
  Administered 2016-01-07: 16:00:00 0.25 mg via ORAL
  Filled 2016-01-07: qty 1

## 2016-01-07 MED ORDER — SODIUM CHLORIDE 0.9 % IV SOLN: 250.0000 mL | INTRAVENOUS | Status: DC | PRN

## 2016-01-07 MED ORDER — IOPAMIDOL (ISOVUE-370) INJECTION 76%
INTRAVENOUS | Status: AC
  Filled 2016-01-07: qty 100

## 2016-01-07 MED ORDER — FENTANYL CITRATE (PF) 100 MCG/2ML IJ SOLN
INTRAMUSCULAR | Status: AC
  Filled 2016-01-07: qty 2

## 2016-01-07 MED ORDER — NITROGLYCERIN 1 MG/10 ML FOR IR/CATH LAB
INTRA_ARTERIAL | Status: AC
  Filled 2016-01-07: qty 10

## 2016-01-07 MED ORDER — LIDOCAINE HCL (PF) 1 % IJ SOLN
INTRAMUSCULAR | Status: DC | PRN
  Administered 2016-01-07: 15 mL

## 2016-01-07 MED ORDER — ATROPINE SULFATE 1 MG/10ML IJ SOSY
PREFILLED_SYRINGE | INTRAMUSCULAR | Status: AC
  Filled 2016-01-07: qty 10

## 2016-01-07 MED ORDER — SODIUM CHLORIDE 0.9% FLUSH: 3.0000 mL | INTRAVENOUS | Status: DC | PRN

## 2016-01-07 MED ORDER — LIDOCAINE HCL (PF) 1 % IJ SOLN
INTRAMUSCULAR | Status: AC
  Filled 2016-01-07: qty 30

## 2016-01-07 MED ORDER — NITROGLYCERIN IN D5W 200-5 MCG/ML-% IV SOLN
10.0000 ug/min | INTRAVENOUS | Status: DC
  Administered 2016-01-07: 10 ug/min via INTRAVENOUS

## 2016-01-07 MED ORDER — ONDANSETRON HCL 4 MG/2ML IJ SOLN
4.0000 mg | Freq: Once | INTRAMUSCULAR | Status: AC
  Filled 2016-01-07: qty 2

## 2016-01-07 MED ORDER — ANGIOPLASTY BOOK
Freq: Once | Status: AC
  Administered 2016-01-07: 20:00:00
  Filled 2016-01-07: qty 1

## 2016-01-07 MED ORDER — MIDAZOLAM HCL 2 MG/2ML IJ SOLN
INTRAMUSCULAR | Status: DC | PRN
  Administered 2016-01-07 (×2): 1 mg via INTRAVENOUS

## 2016-01-07 SURGICAL SUPPLY — 17 items
BALLN MINITREK RX 2.0X12 (BALLOONS) ×2
BALLN ~~LOC~~ TREK RX 3.0X12 (BALLOONS) ×2
BALLOON MINITREK RX 2.0X12 (BALLOONS) ×1 IMPLANT
BALLOON ~~LOC~~ TREK RX 3.0X12 (BALLOONS) ×1 IMPLANT
CATH INFINITI MULTIPACK ST 5F (CATHETERS) ×2 IMPLANT
GUIDE CATH RUNWAY 6FR FR4 (CATHETERS) ×2 IMPLANT
HOVERMATT SINGLE USE (MISCELLANEOUS) ×2 IMPLANT
KIT ENCORE 26 ADVANTAGE (KITS) ×2 IMPLANT
KIT HEART LEFT (KITS) ×2 IMPLANT
PACK CARDIAC CATHETERIZATION (CUSTOM PROCEDURE TRAY) ×2 IMPLANT
SHEATH PINNACLE 5F 10CM (SHEATH) ×2 IMPLANT
SHEATH PINNACLE 6F 10CM (SHEATH) ×2 IMPLANT
STENT XIENCE ALPINE RX 3.0X18 (Permanent Stent) ×2 IMPLANT
SYR MEDRAD MARK V 150ML (SYRINGE) ×2 IMPLANT
TRANSDUCER W/STOPCOCK (MISCELLANEOUS) ×2 IMPLANT
WIRE .035 3MM-J 145CM (WIRE) ×2 IMPLANT
WIRE RUNTHROUGH .014X180CM (WIRE) ×2 IMPLANT

## 2016-01-07 NOTE — Interval H&P Note (Signed)
Cath Lab Visit (complete for each Cath Lab visit)  Clinical Evaluation Leading to the Procedure:   ACS: Yes.    Non-ACS:    Anginal Classification: CCS IV  Anti-ischemic medical therapy: Maximal Therapy (2 or more classes of medications)  Non-Invasive Test Results: No non-invasive testing performed  Prior CABG: No previous CABG      History and Physical Interval Note:  01/07/2016 12:02 PM  Caitlyn MoralesPhyllis D Maurer  has presented today for surgery, with the diagnosis of cp  The various methods of treatment have been discussed with the patient and family. After consideration of risks, benefits and other options for treatment, the patient has consented to  Procedure(s): Left Heart Cath and Coronary Angiography (N/A) as a surgical intervention .  The patient's history has been reviewed, patient examined, no change in status, stable for surgery.  I have reviewed the patient's chart and labs.  Questions were answered to the patient's satisfaction.     Rinaldo CloudHarwani, Sabryna Lahm

## 2016-01-07 NOTE — Progress Notes (Signed)
Subjective:  Doing well denies any chest pain or shortness of breath complaints of headache because of IV nitroglycerin  Objective:  Vital Signs in the last 24 hours: Temp:  [97.7 F (36.5 C)-98.5 F (36.9 C)] 97.7 F (36.5 C) (10/23 1400) Pulse Rate:  [0-71] 61 (10/23 1630) Resp:  [11-24] 14 (10/23 1630) BP: (67-209)/(25-117) 147/54 (10/23 1630) SpO2:  [94 %-100 %] 96 % (10/23 1630) Weight:  [282 lb 10.1 oz (128.2 kg)] 282 lb 10.1 oz (128.2 kg) (10/22 1732)  Intake/Output from previous day: 10/22 0701 - 10/23 0700 In: 3 [I.V.:3] Out: -  Intake/Output from this shift: Total I/O In: 3 [I.V.:3] Out: 1500 [Urine:1500]  Physical Exam: Neck: no adenopathy, no carotid bruit, no JVD and supple, symmetrical, trachea midline Lungs: clear to auscultation bilaterally Heart: regular rate and rhythm, S1, S2 normal and Soft systolic murmur noted Abdomen: soft, non-tender; bowel sounds normal; no masses,  no organomegaly Extremities: extremities normal, atraumatic, no cyanosis or edema and Right groin cath site dry no hematoma  Lab Results:  Recent Labs  01/07/16 0033 01/07/16 0954  WBC 8.3 6.7  HGB 12.3 13.1  PLT 217 205    Recent Labs  01/06/16 1619 01/07/16 0954  NA 140 142  K 4.0 4.0  CL 111 112*  CO2 25 23  GLUCOSE 92 91  BUN 22* 12  CREATININE 0.91 0.75    Recent Labs  01/06/16 1619 01/07/16 0030  TROPONINI 0.24* 0.22*   Hepatic Function Panel  Recent Labs  01/06/16 1619  PROT 7.1  ALBUMIN 3.6  AST 20  ALT 15  ALKPHOS 82  BILITOT 0.4    Recent Labs  01/07/16 0954  CHOL 176   No results for input(s): PROTIME in the last 72 hours.  Imaging: Imaging results have been reviewed and Dg Chest Portable 1 View  Result Date: 01/06/2016 CLINICAL DATA:  Intermittent chest pain EXAM: PORTABLE CHEST 1 VIEW COMPARISON:  12/21/2013 FINDINGS: Cardiomegaly is noted. No acute infiltrate or pleural effusion. No pulmonary edema. Mild mid thoracic  dextroscoliosis. IMPRESSION: No active disease. Electronically Signed   By: Natasha Mead M.D.   On: 01/06/2016 14:26   Ct Angio Chest Aorta W/cm &/or Wo/cm  Result Date: 01/07/2016 CLINICAL DATA:  Chest pain with radiation to the back. Shortness of breath EXAM: CT ANGIOGRAPHY CHEST WITH CONTRAST TECHNIQUE: Multidetector CT imaging of the chest was performed using the standard protocol during bolus administration of intravenous contrast. Multiplanar CT image reconstructions and MIPs were obtained to evaluate the vascular anatomy. CONTRAST:  80 mL Isovue 370 intravenous COMPARISON:  Chest x-ray 01/06/2016 FINDINGS: Cardiovascular: No evidence for mediastinal hematoma. Thoracic aorta demonstrates normal caliber no evidence for aneurysm or dissection. Mild coronary artery calcifications. Heart size is mildly enlarged. No significant pericardial effusion. Mild calcification present within the aorta. Mediastinum/Nodes: No enlarged mediastinal, hilar, or axillary lymph nodes. Thyroid gland, trachea, and esophagus demonstrate no significant findings. Lungs/Pleura: Bilateral upper lobe emphysematous disease. Patchy dependent atelectasis. No pleural effusion or consolidation. In the right lower lobe there is a 7 x 10 mm nodule (9 mm mean diameter) on image 64 of series 7. There are no other nodules identified. Upper Abdomen: There is a 5.1 cm partially visualized cyst in the right upper quadrant possibly exophytic from the right kidney. Nodular enlargement of the adrenal glands. Musculoskeletal: No acute osseous abnormality.  Degenerative changes Review of the MIP images confirms the above findings. IMPRESSION: 1. No CT evidence for aortic dissection. No mediastinal hematoma or  evidence for aneurysm. 2. Bilateral emphysematous disease. 9 mm right lower lobe pulmonary nodule. Consider one of the following in 3 months for both low-risk and high-risk individuals: (a) repeat chest CT, (b) follow-up PET-CT, or (c) tissue  sampling. This recommendation follows the consensus statement: Guidelines for Management of Incidental Pulmonary Nodules Detected on CT Images: From the Fleischner Society 2017; Radiology 2017; 284:228-243. 3. Partially visualized 5 cm cyst in the right upper quadrant. Electronically Signed   By: Jasmine PangKim  Fujinaga M.D.   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  LOS: 1 day    Caitlyn [name], Caitlyn [name] 01/07/2016, 5:25 PM

## 2016-01-07 NOTE — Progress Notes (Signed)
The Brilinta is not covered under the members plan but there is an alternative which is for Clopidogrel at 75mg  30 day supply is for 5.50 co payment and no pior Berkley Harveyauth is needed

## 2016-01-07 NOTE — Progress Notes (Addendum)
ANTICOAGULATION CONSULT NOTE - Follow-Up Consult  Pharmacy Consult for Heparin Indication: chest pain/ACS  Allergies  Allergen Reactions  . Amlodipine Swelling  . Statins Other (See Comments)    myalgia    Patient Measurements: Height: 5\' 6"  (167.6 cm) Weight: 282 lb 10.1 oz (128.2 kg) IBW/kg (Calculated) : 59.3 Heparin Dosing Weight: 85.9 kg  Vital Signs: Temp: 97.8 F (36.6 C) (10/23 0700) Temp Source: Oral (10/23 0700) BP: 177/78 (10/23 0700) Pulse Rate: 59 (10/23 0700)  Labs:  Recent Labs  01/06/16 1248 01/06/16 1255 01/06/16 1619 01/06/16 2226 01/07/16 0030 01/07/16 0033 01/07/16 0256  HGB 13.8  --  13.1  --   --  12.3  --   HCT 42.5  --  40.9  --   --  38.5  --   PLT 224  --  212  --   --  217  --   APTT  --  29  --   --   --   --   --   LABPROT  --  13.0  --   --   --   --   --   INR  --  0.98  --   --   --   --   --   HEPARINUNFRC  --   --   --  0.30  --   --  0.39  CREATININE 1.07*  --  0.91  --   --   --   --   TROPONINI  --   --  0.24*  --  0.22*  --   --     Estimated Creatinine Clearance: 87.9 mL/min (by C-G formula based on SCr of 0.91 mg/dL).   Medical History: Past Medical History:  Diagnosis Date  . CAD 06/2009   a.  s/p MI 4/11: tx with DES x 2 to RCA;  b.  LHC 03/17/11: LAD 30-40%, distal LAD 30-40%, OM2 40-50%, RCA stent patent, EF greater than 70%.;  c.  Lexiscan Myoview (12/15):  Mild apical thinning, no ischemia, EF 61%; normal study  . CAROTID STENOSIS    carotid Dopplers 03/25/11: RICA 60-79%; LICA 0-39%.  Follow up recommended in 6 months.  Marland Kitchen. HYPERLIPIDEMIA   . HYPERTENSION   . HYPOTHYROIDISM    post ablation tx Graves  . PVD   . TOBACCO ABUSE quit 06/2009  . Varicose veins   . VITAMIN D DEFICIENCY    DEXA 04/2009 normal    Medications:  Scheduled:  . allopurinol  300 mg Oral Daily  . aspirin  324 mg Oral NOW   Or  . aspirin  300 mg Rectal NOW  . aspirin EC  81 mg Oral Daily  . atorvastatin  20 mg Oral q1800  .  clonazePAM  1 mg Oral QHS  . clopidogrel  75 mg Oral Daily  . levothyroxine  125 mcg Oral QAC breakfast  . metoprolol tartrate  25 mg Oral BID  . sodium chloride flush  3 mL Intravenous Q12H  . Vitamin D (Ergocalciferol)  50,000 Units Oral Q Mon   Infusions:  . sodium chloride    . sodium chloride 1 mL/kg/hr (01/07/16 0600)  . heparin 1,150 Units/hr (01/07/16 0600)  . nitroGLYCERIN 5 mcg/min (01/07/16 0600)   PRN: sodium chloride, acetaminophen, nitroGLYCERIN, ondansetron (ZOFRAN) IV, sodium chloride flush   . sodium chloride    . sodium chloride 1 mL/kg/hr (01/07/16 0600)  . heparin 1,150 Units/hr (01/07/16 0600)  . nitroGLYCERIN 5 mcg/min (01/07/16 0600)  Assessment: 65 yo female with Hx CAD s/p CAD with DES x 2 who presents with 2 week hx intermittent CP.  On IV heparin for anticoagulation with therapeutic heparin level this morning.  CBC fairly stable, although Hgb with slight trend down. No bleeding or complications noted.  Goal of Therapy:  Heparin level 0.3-0.7 units/ml Monitor platelets by anticoagulation protocol: Yes   Plan:  Continue IV heparin at 1150 units/hr. Daily heparin level and CBC while on heparin. R/u plans for cath.  Tad Moore, BCPS  Clinical Pharmacist Pager [phone]  01/07/2016 10:07 AM

## 2016-01-07 NOTE — Progress Notes (Signed)
ANTICOAGULATION CONSULT NOTE - Follow-Up Consult  Pharmacy Consult for Heparin Indication: chest pain/ACS  Allergies  Allergen Reactions  . Amlodipine Swelling  . Statins Other (See Comments)    myalgia    Patient Measurements: Height: 5\' 6"  (167.6 cm) Weight: 282 lb 10.1 oz (128.2 kg) IBW/kg (Calculated) : 59.3 Heparin Dosing Weight: 85.9 kg  Vital Signs: Temp: 98.2 F (36.8 C) (10/22 2238) Temp Source: Oral (10/22 2238) BP: 155/75 (10/22 2238) Pulse Rate: 71 (10/22 2238)  Labs:  Recent Labs  01/06/16 1248 01/06/16 1255 01/06/16 1619 01/06/16 2226  HGB 13.8  --  13.1  --   HCT 42.5  --  40.9  --   PLT 224  --  212  --   APTT  --  29  --   --   LABPROT  --  13.0  --   --   INR  --  0.98  --   --   HEPARINUNFRC  --   --   --  0.30  CREATININE 1.07*  --  0.91  --   TROPONINI  --   --  0.24*  --     Estimated Creatinine Clearance: 87.9 mL/min (by C-G formula based on SCr of 0.91 mg/dL).   Medical History: Past Medical History:  Diagnosis Date  . CAD 06/2009   a.  s/p MI 4/11: tx with DES x 2 to RCA;  b.  LHC 03/17/11: LAD 30-40%, distal LAD 30-40%, OM2 40-50%, RCA stent patent, EF greater than 70%.;  c.  Lexiscan Myoview (12/15):  Mild apical thinning, no ischemia, EF 61%; normal study  . CAROTID STENOSIS    carotid Dopplers 03/25/11: RICA 60-79%; LICA 0-39%.  Follow up recommended in 6 months.  Marland Kitchen. HYPERLIPIDEMIA   . HYPERTENSION   . HYPOTHYROIDISM    post ablation tx Graves  . PVD   . TOBACCO ABUSE quit 06/2009  . Varicose veins   . VITAMIN D DEFICIENCY    DEXA 04/2009 normal    Medications:  Scheduled:  . allopurinol  300 mg Oral Daily  . aspirin  324 mg Oral NOW   Or  . aspirin  300 mg Rectal NOW  . aspirin  81 mg Oral Pre-Cath  . aspirin EC  81 mg Oral Daily  . atorvastatin  20 mg Oral q1800  . clonazePAM  1 mg Oral QHS  . clopidogrel  75 mg Oral Daily  . levothyroxine  125 mcg Oral QAC breakfast  . metoprolol tartrate  25 mg Oral BID  . sodium  chloride flush  3 mL Intravenous Q12H  . Vitamin D (Ergocalciferol)  50,000 Units Oral Q Mon   Infusions:  . sodium chloride    . sodium chloride 1 mL/kg/hr (01/06/16 1818)  . heparin 1,000 Units/hr (01/06/16 2100)  . nitroGLYCERIN 5 mcg/min (01/06/16 2100)   PRN: sodium chloride, acetaminophen, nitroGLYCERIN, ondansetron (ZOFRAN) IV, sodium chloride flush  Assessment: 65 yo female with Hx CAD s/p CAD with DES x 2 who presents with 2 week hx intermittent CP.  Pt started on IV heparin with low range therapeutic level on 1000 units/hr.  Will increase infusion rate to maintain within mid-range of goal.  Goal of Therapy:  Heparin level 0.3-0.7 units/ml Monitor platelets by anticoagulation protocol: Yes   Plan:  Increase heparin infusion to 1150 units/hr Heparin level for confirmation with AM labs. Daily heparin level and CBC while on heparin.  Toys 'R' UsKimberly Amilee Janvier, Pharm.D., BCPS Clinical Pharmacist Pager [phone] 01/07/2016 12:21 AM

## 2016-01-07 NOTE — Progress Notes (Signed)
Patient received from Cath Lab 1330.  Difficult to manage this afternoon.  Patient complained of nausea, pain, anxiety, being too hot, being to cold, hypotension, hypertension.  Required near constant management with prn meds, nitro drip, IVF boluses, and supportive care.  All issues seem resolved at end of shift (1900).

## 2016-01-07 NOTE — Progress Notes (Signed)
Site area: right groin  Site Prior to Removal:  Level 0  Pressure Applied For 20 MINUTES    Minutes Beginning at 1745  Manual:   Yes.    Patient Status During Pull:  See previous note  Post Pull Groin Site:  Level 0  Post Pull Instructions Given:  Yes.    Post Pull Pulses Present:  Yes.    Dressing Applied:  Yes.    Comments:

## 2016-01-07 NOTE — Care Management Note (Addendum)
Case Management Note  Patient Details  Name: Caitlyn [name] MRN: [id number] Date of Birth: 19-Aug-1950  Subjective/Objective:    S/p coronary stent intervention, will be on brilinta, NCM awaiting benefit check , gave patient 30 day savings card, [facility], and they do have brilinta in stock.                  Action/Plan:   Expected Discharge Date:                  Expected Discharge Plan:  Home/Self Care  In-House Referral:     Discharge planning Services  CM Consult  Post Acute Care Choice:    Choice offered to:     DME Arranged:    DME Agency:     HH Arranged:    HH Agency:     Status of Service:  In process, will continue to follow  If discussed at Long Length of Stay Meetings, dates discussed:    Additional Comments:  Caitlyn [name], Caitlyn Gasser [place], RN 01/07/2016, 2:17 PM

## 2016-01-08 LAB — BASIC METABOLIC PANEL
Anion gap: 8 (ref 5–15)
BUN: 13 mg/dL (ref 6–20)
CHLORIDE: 106 mmol/L (ref 101–111)
CO2: 23 mmol/L (ref 22–32)
CREATININE: 0.9 mg/dL (ref 0.44–1.00)
Calcium: 8.7 mg/dL — ABNORMAL LOW (ref 8.9–10.3)
GFR calc Af Amer: >60 mL/min (ref >60)
GFR calc non Af Amer: >60 mL/min (ref >60)
GLUCOSE: 95 mg/dL (ref 65–99)
Potassium: 3.7 mmol/L (ref 3.5–5.1)
SODIUM: 137 mmol/L (ref 135–145)

## 2016-01-08 LAB — CBC
HCT: 39.9 % (ref 36.0–46.0)
HEMOGLOBIN: 13.1 g/dL (ref 12.0–15.0)
MCH: 29.9 pg (ref 26.0–34.0)
MCHC: 32.8 g/dL (ref 30.0–36.0)
MCV: 91.1 fL (ref 78.0–100.0)
Platelets: 217 10*3/uL (ref 150–400)
RBC: 4.38 MIL/uL (ref 3.87–5.11)
RDW: 14.2 % (ref 11.5–15.5)
WBC: 10 10*3/uL (ref 4.0–10.5)

## 2016-01-08 MED ORDER — TICAGRELOR 90 MG PO TABS: 90.0000 mg | ORAL_TABLET | Freq: Two times a day (BID) | ORAL | 3 refills | Status: DC

## 2016-01-08 NOTE — Progress Notes (Signed)
CARDIAC REHAB PHASE I   PRE:  Rate/Rhythm: 65 SR     BP: sitting 163/65    SaO2:   MODE:  Ambulation: 250 ft   POST:  Rate/Rhythm: 90 SR    BP: sitting 189/63     SaO2:   Pt with limited mobility this am due to back hurting. No CP. BP elevated. Ed completed with good reception. She has 30 day Brilinta card but her insurance will not cover after the free 30 days. She is to discuss with Dr. Sharyn LullHarwani. Will send referral to [facility], she has done program before. Encouraged more ex. She also has an appt with PCP next week, encouraged her have A1C checked.  [phone]   Caitlyn Massonandi Caitlyn [name] CES, ACSM 01/08/2016 10:05 AM

## 2016-01-08 NOTE — Care Management Note (Signed)
Case Management Note  Patient Details  Name: Caitlyn [name] MRN: [id number] Date of Birth: 1950/07/09  Subjective/Objective:     S/p coronary stent intervention, will be on brilinta, NCM awaiting benefit check , gave patient 30 day savings card, patient goes to [facility], and they do have brilinta in stock  .  Informed patient that brilinta is not covered under her plan and the alternative is clopidogrel at 75mg   30 day supply is for 5.50 co pay.  NCM informed her that she has the 30 day savings card and at her follow up with MD he will most likely change her to something else.  Patient is for dc today.             Action/Plan:   Expected Discharge Date:                  Expected Discharge Plan:  Home/Self Care  In-House Referral:     Discharge planning Services  CM Consult  Post Acute Care Choice:    Choice offered to:     DME Arranged:    DME Agency:     HH Arranged:    HH Agency:     Status of Service:  Completed, signed off  If discussed at [employer] Length of Stay Meetings, dates discussed:    Additional Comments:  Leone Havenaylor, Emonee Winkowski [place], RN 01/08/2016, 11:02 AM

## 2016-01-08 NOTE — Discharge Summary (Signed)
Discharge summary dictated on 01/08/2016 dictation number is [phone]

## 2016-01-08 NOTE — Discharge Instructions (Signed)
Coronary Angiogram With Stent °Coronary angiography with stent placement is a procedure to widen or open a narrow blood vessel of the heart (coronary artery). When a coronary artery becomes partially blocked, it decreases blood flow to that area. This may lead to chest pain or a heart attack (myocardial infarction). Arteries may become blocked by cholesterol buildup (plaque) in the lining or wall.  °A stent is a small piece of metal that looks like a mesh or a spring. Stent placement may be done right after a coronary angiography in which a blocked artery is found or as a treatment for a heart attack.  °LET YOUR HEALTH CARE PROVIDER KNOW ABOUT: °· Any allergies you have.   °· All medicines you are taking, including vitamins, herbs, eye drops, creams, and over-the-counter medicines.   °· Previous problems you or members of your family have had with the use of anesthetics.   °· Any blood disorders you have.   °· Previous surgeries you have had.   °· Medical conditions you have. °RISKS AND COMPLICATIONS °Generally, coronary angiography with stent is a safe procedure. However, problems can occur and include: °· Damage to the heart or its blood vessels.   °· A return of blockage.   °· Bleeding, infection, or bruising at the insertion site.   °· A collection of blood under the skin (hematoma) at the insertion site. °· Blood clot in another part of the body.   °· Kidney injury.   °· Allergic reaction to the dye or contrast used.   °· Bleeding into the abdomen (retroperitoneal bleeding). °BEFORE THE PROCEDURE °· Do not eat or drink anything after midnight on the night before the procedure or as directed by your health care provider.  °· Ask your health care provider about changing or stopping your regular medicines. This is especially important if you are taking diabetes medicines or blood thinners. °· Your health care provider will make sure you understand the procedure as well as the risks and potential problems  associated with the procedure.   °PROCEDURE °· You may be given a medicine to help you relax before and during the procedure (sedative). This medicine will be given through an IV tube that is put into one of your veins.   °· The area where the catheter will be inserted will be shaved and cleaned. This is usually done in the groin but may be done in the fold of your arm (near your elbow) or in the wrist.    °· A medicine will be given to numb the area where the catheter will be inserted (local anesthetic).   °· The catheter will be inserted into an artery using a guide wire. A type of X-ray (fluoroscopy) will be used to help guide the catheter to the opening of the blocked artery.   °· A dye will then be injected into the catheter, and X-rays will be taken. The dye will help to show where any narrowing or blockages are located in the heart arteries.   °· A tiny wire will be guided to the blocked spot, and a balloon will be inflated to make the artery wider. The stent will be expanded and will crush the plaque into the wall of the vessel. The stent will hold the area open like a scaffolding and improve the blood flow.   °· Sometimes the artery may be made wider using a laser or other tools to remove plaque.   °· When the blood flow is better, the catheter will be removed. The lining of the artery will grow over the stent, which stays where it was placed.   °  AFTER THE PROCEDURE °· If the procedure is done through the leg, you will be kept in bed lying flat for about 6 hours. You will be instructed to not bend or cross your legs.   °· The insertion site will be checked frequently.   °· The pulse in your feet or wrist will be checked frequently.   °· Additional blood tests, X-rays, and electrocardiography may be done. °  °This information is not intended to replace advice given to you by your health care provider. Make sure you discuss any questions you have with your health care provider. °  °Document Released:  09/07/2002 Document Revised: 03/24/2014 Document Reviewed: 07/26/2012 °Elsevier Interactive Patient Education ©2016 Elsevier Inc. °Acute Coronary Syndrome °Acute coronary syndrome (ACS) is a serious problem in which there is suddenly not enough blood and oxygen supplied to the heart. ACS may mean that one or more of the blood vessels in your heart (coronary arteries) may be blocked. ACS can result in chest pain or a heart attack (myocardial infarction or MI). °CAUSES °This condition is caused by atherosclerosis, which is the buildup of fat and cholesterol (plaque) on the inside of the arteries. Over time, the plaque may narrow or block the artery, and this will lessen blood flow to the heart. Plaque can also become weak and break off within a coronary artery to form a clot and cause a sudden blockage. °RISK FACTORS °The risks factors of this condition include: °· High cholesterol levels. °· High blood pressure (hypertension). °· Smoking. °· Diabetes. °· Age. °· Family history of chest pain, heart disease, or stroke. °· Lack of exercise. °SYMPTOMS °The most common signs of this condition include: °· Chest pain, which can be: °¨ A crushing or squeezing in the chest. °¨ A tightness, pressure, fullness, or heaviness in the chest. °¨ Present for more than a few minutes, or it can stop and recur. °· Pain in the arms, neck, jaw, or back. °· Unexplained heartburn or indigestion. °· Shortness of breath. °· Nausea. °· Sudden cold sweats. °· Feeling light-headed or dizzy. °Sometimes, this condition has no symptoms. °DIAGNOSIS °ACS may be diagnosed through the following tests: °· Electrocardiogram (ECG). °· Blood tests. °· Coronary angiogram. This is a procedure to look at the coronary arteries to see if there is any blockage. °TREATMENT °Treatment for ACS may include: °· Healthy behavioral changes to reduce or control risk factors. °· Medicine. °· Coronary stenting. A stent helps to keep an artery open. °· Coronary  angioplasty. This procedure widens a narrowed or blocked artery. °· Coronary artery bypass surgery. This will allow your blood to pass the blockage (bypass) to reach your heart. °HOME CARE INSTRUCTIONS °Eating and Drinking °· Follow a heart-healthy diet. A dietitian can you help to educate you about healthy food options and changes. °· Use healthy cooking methods such as roasting, grilling, broiling, baking, poaching, steaming, or stir-frying. Talk to a dietitian to learn more about healthy cooking methods. °Medicines °· Take medicines only as directed by your health care provider. °· Do not take the following medicines unless your health care provider approves: °¨ Nonsteroidal anti-inflammatory drugs (NSAIDs), such as ibuprofen, naproxen, or celecoxib. °¨ Vitamin supplements that contain vitamin A, vitamin E, or both. °¨ Hormone replacement therapy that contains estrogen with or without progestin. °· Stop illegal drug use. °Activities °· Follow an exercise program that is approved by your health care provider. °· Plan rest periods when you are fatigued. °Lifestyle °· Do not use any tobacco products, including cigarettes, chewing tobacco,   or electronic cigarettes. If you need help quitting, ask your health care provider. °· If you drink alcohol, and your health care provider approves, limit your alcohol intake to no more than 1 drink per day. One drink equals 12 ounces of beer, 5 ounces of wine, or 1½ ounces of hard liquor. °· Learn to manage stress. °· Maintain a healthy weight. Lose weight as approved by your health care provider. °General Instructions °· Manage other health conditions, such as hypertension and diabetes, as directed by your health care provider. °· Keep all follow-up visits as directed by your health care provider. This is important. °· Your health care provider may ask you to monitor your blood pressure. A blood pressure reading consists of a higher number over a lower number, such as 110 over  72, written as 110/72. Ideally, your blood pressure should be: °¨ Below 140/90 if you have no other medical conditions. °¨ Below 130/80 if you have diabetes or kidney disease. °SEEK IMMEDIATE MEDICAL CARE IF: °· You have pain in your chest, neck, arm, jaw, stomach, or back that lasts more than a few minutes, is recurring, or is not relieved by taking medicine under your tongue (sublingual nitroglycerin). °· You have profuse sweating without cause. °· You have unexplained: °¨ Heartburn or indigestion. °¨ Shortness of breath or difficulty breathing. °¨ Nausea or vomiting. °¨ Fatigue. °¨ Feelings of nervousness or anxiety. °¨ Weakness. °¨ Diarrhea. °· You have sudden light-headedness or dizziness. °· You faint. °These symptoms may represent a serious problem that is an emergency. Do not wait to see if the symptoms will go away. Get medical help right away. Call your local emergency services (911 in the [place] Do not drive yourself to the clinic or hospital. °  °This information is not intended to replace advice given to you by your health care provider. Make sure you discuss any questions you have with your health care provider. °  °Document Released: 03/03/2005 Document Revised: 03/24/2014 Document Reviewed: 07/05/2013 °Elsevier Interactive Patient Education ©2016 Elsevier Inc. ° °

## 2016-01-09 NOTE — Discharge Summary (Signed)
Caitlyn [name] NO.:  [id number]  MEDICAL RECORD NO.:  [id number]  LOCATION:  6C06C                        FACILITY:  [facility]  PHYSICIAN:  Yanett Conkright N. Sharyn Lull, M.D. DATE OF BIRTH:  1950-05-24  DATE OF ADMISSION:  01/06/2016 DATE OF DISCHARGE:  01/08/2016                              DISCHARGE SUMMARY   ADMITTING DIAGNOSES: 1. Acute coronary syndrome. 2. Coronary artery disease, history of non-Q-wave myocardial     infarction in the past. 3. Hypertension. 4. Hyperlipidemia. 5. Morbid obesity. 6. Peripheral vascular disease.  History of left carotid stenting in     the past. 7. History of tobacco abuse. 8. Vitamin D deficiency. 9. History of varicose veins.  DISCHARGE DIAGNOSES: 1. Acute coronary syndrome status post left cardiac     catheterization/PTCA stenting to mid RCA. 2. Coronary artery disease, history of non-Q-wave myocardial     infarction in the past status post PCI to RCA in the past     approximately 6-7 years ago. 3. Hypertension. 4. Hyperlipidemia. 5. Morbid obesity. 6. Peripheral vascular disease.  History of carotid stenting in the     past. 7. History of tobacco abuse. 8. Vitamin D deficiency. 9. History of varicose veins.  DISCHARGE HOME MEDICATIONS: 1. Brilinta 90 mg 1 tablet twice daily. 2. Tylenol 1000 mg every 6 hours as needed. 3. Albuterol inhaler as before. 4. Aspirin 81 mg 1 tablet daily. 5. Atorvastatin 20 mg daily. 6. Clonazepam 1 mg by mouth at bedtime. 7. Colchicine 0.6 mg as needed for flare-ups of gout. 8. Fluticasone 50 mcg 2 sprays as needed. 9. Multivitamin 1 tablet daily. 10.Levothyroxine 125 mcg daily. 11.Lisinopril 40 mg 1 tablet daily. 12.Methocarbamol 500 mg every 8 hours as needed. 13.Metoprolol succinate 25 mg daily. 14.Nitrostat sublingual use as directed. 15.Vitamin D 50,000 units every week. 16.Ambien 5 mg as needed as before. 17.Allopurinol 100 mg 1 tablet daily.  DIET:  Low salt, low  cholesterol.  The patient has been advised to stop clopidogrel, furosemide, and naproxen.  BRIEF HISTORY AND HOSPITAL COURSE:  Caitlyn [name] is a 65 year old female with past medical history significant for coronary artery disease, history of non-Q-wave myocardial infarction in the past status post PCI to RCA in the past, hypertension, hyperlipidemia, hypothyroidism, peripheral vascular disease status post left carotid stenting in the past, history of tobacco abuse quit in 2012, vitamin D deficiency, history of varicose veins.  She came to the ER complaining of recurrent retrosternal chest pain described as dull, aching, nagging, radiating to the back, grade 8/10, lasting 20-30 minutes.  The patient denies any nausea, vomiting, diaphoresis, or shortness of breath.  Denies palpitation, lightheadedness, or syncope.  EKG done in the ED showed normal sinus rhythm with minor ST-T wave changes in inferior leads and was noted to have troponin I minimally elevated.  The patient recently had nuclear stress test approximately 2 weeks ago which showed no evidence of reversible ischemia.  States occasionally gets chest pain, feels better after sitting up, but denies any relation of chest pain to food, breathing.  Denies any cough, fever, flu recently.  PHYSICAL EXAMINATION:  GENERAL:  She was alert, awake, oriented x3.  No acute distress. VITAL SIGNS:  Blood pressure was 154/88, pulse 54, she was afebrile. EYES:  Conjunctivae pink. NECK:  Supple.  No JVD.  No bruit. LUNGS:  Clear to auscultation without rhonchi or rales. CARDIOVASCULAR:  S1, S2 normal.  There were soft systolic murmur and S4 gallop. ABDOMEN:  Soft.  Bowel sounds are present.  Nontender. EXTREMITIES:  There is no clubbing, cyanosis, or edema.  LABORATORY DATA:  Sodium was 142, potassium 4, BUN 12, creatinine 0.75, and glucose was 91.  Cholesterol 176, triglycerides 85, HDL 51, LDL 108. Hemoglobin was 12.3, hematocrit 38.5, white  count of 8.3.  Her troponin I was 0.022, 0.24, and 0.22 which is trending down.  BRIEF HOSPITAL COURSE:  The patient was admitted to step-down unit.  The patient ruled in for small non-Q-wave myocardial infarction.  The patient subsequently underwent left cardiac catheterization and PTCA stenting to proximal and mid junction of RCA as per procedure report. The patient tolerated procedure well.  There were no complications. Postprocedure, the patient did not have any episodes of chest pain during the hospital stay.  Her groin is stable with no evidence of hematoma or bruit.  The patient is ambulating in the room without any problems.  Also, the patient had a spiral CT of the chest which showed 9 mm right lower lobe lung nodule.  RECOMMENDATION:  Was to repeat CT in approximately 3 months.  The patient was discussed at length about this finding and will be followed up as outpatient and will be evaluated by Pulmonary.     Eduardo OsierMohan N. Sharyn LullHarwani, M.D.     [facility]  D:  01/08/2016  T:  01/09/2016  Job:  [id number]

## 2016-04-11 ENCOUNTER — Other Ambulatory Visit (HOSPITAL_COMMUNITY)
Admission: RE | Admit: 2016-04-11 | Discharge: 2016-04-11 | Disposition: A | Discharge status: Home / Self Care | Payer: 59 | Source: Ambulatory Visit | Attending: Internal Medicine | Admitting: Internal Medicine

## 2016-04-11 ENCOUNTER — Other Ambulatory Visit: Payer: Self-pay | Admitting: Internal Medicine

## 2016-04-11 DIAGNOSIS — Z01419 Encounter for gynecological examination (general) (routine) without abnormal findings: Secondary | ICD-10-CM | POA: Insufficient documentation

## 2016-04-11 DIAGNOSIS — Z1151 Encounter for screening for human papillomavirus (HPV): Secondary | ICD-10-CM | POA: Diagnosis present

## 2016-04-16 LAB — CYTOLOGY - PAP
DIAGNOSIS: NEGATIVE
HPV: NOT DETECTED

## 2016-05-05 ENCOUNTER — Encounter (HOSPITAL_COMMUNITY): Payer: Self-pay | Admitting: Family Medicine

## 2016-05-05 NOTE — Progress Notes (Signed)
Mailed patient letter with information about Cardiac Rehab program. MW °

## 2016-05-06 ENCOUNTER — Other Ambulatory Visit: Payer: Self-pay | Admitting: Internal Medicine

## 2016-05-06 DIAGNOSIS — R911 Solitary pulmonary nodule: Secondary | ICD-10-CM

## 2016-05-08 ENCOUNTER — Ambulatory Visit
Admission: RE | Admit: 2016-05-08 | Discharge: 2016-05-08 | Disposition: A | Discharge status: Home / Self Care | Payer: 59 | Source: Ambulatory Visit | Attending: Internal Medicine | Admitting: Internal Medicine

## 2016-05-08 DIAGNOSIS — R911 Solitary pulmonary nodule: Secondary | ICD-10-CM

## 2016-05-08 MED ORDER — IOPAMIDOL (ISOVUE-300) INJECTION 61%
80.0000 mL | Freq: Once | INTRAVENOUS | Status: AC | PRN
  Administered 2016-05-08: 80 mL via INTRAVENOUS

## 2016-05-28 ENCOUNTER — Ambulatory Visit (INDEPENDENT_AMBULATORY_CARE_PROVIDER_SITE_OTHER): Payer: 59 | Admitting: Internal Medicine

## 2016-05-28 ENCOUNTER — Other Ambulatory Visit: Payer: Self-pay

## 2016-05-28 ENCOUNTER — Encounter: Payer: Self-pay | Admitting: Internal Medicine

## 2016-05-28 VITALS — BP 136/80 | HR 60 | Ht 66.0 in | Wt 272.4 lb

## 2016-05-28 DIAGNOSIS — R05 Cough: Secondary | ICD-10-CM

## 2016-05-28 DIAGNOSIS — M25561 Pain in right knee: Secondary | ICD-10-CM

## 2016-05-28 DIAGNOSIS — I739 Peripheral vascular disease, unspecified: Secondary | ICD-10-CM

## 2016-05-28 DIAGNOSIS — R918 Other nonspecific abnormal finding of lung field: Secondary | ICD-10-CM | POA: Diagnosis not present

## 2016-05-28 DIAGNOSIS — M25562 Pain in left knee: Principal | ICD-10-CM

## 2016-05-28 DIAGNOSIS — I10 Essential (primary) hypertension: Secondary | ICD-10-CM

## 2016-05-28 DIAGNOSIS — R058 Other specified cough: Secondary | ICD-10-CM

## 2016-05-28 MED ORDER — IRBESARTAN 300 MG PO TABS: 300.0000 mg | ORAL_TABLET | Freq: Every day | ORAL | 11 refills | Status: DC

## 2016-05-28 NOTE — Patient Instructions (Signed)
Stop lisinopril and start avapro (ibesartan) 300 mg daily and check back with your PCP or Harwani re further blood pressure medication adjustments but your present cough is almost certainly due to lisinopril  We will contact you to arrange CT chest for 11/06/16 but most likely these nodules (3) are benign

## 2016-05-28 NOTE — Assessment & Plan Note (Signed)
Quit smoking 2011 1st noted 01/07/16 RLL > repeat 05/09/16 = 3 nodules > rec f/u 11/06/16  - Spirometry 05/28/2016  FEV1 1.87 (85%)  Ratio 74 no rx prior    CT results reviewed with pt >>> Too small for PET or bx, not suspicious enough for excisional bx > really only option for now is follow the Fleischner society guidelines as rec by radiology.   Discussed in detail all the  indications, usual  risks and alternatives  relative to the benefits with patient who agrees to proceed with conservative f/u as outlined    Total time devoted to counseling  > 50 % of initial 60 min office visit:  review case with pt/ discussion of options/alternatives/ personally creating written customized instructions  in presence of pt  then going over those specific  Instructions directly with the pt including how to use all of the meds but in particular covering each new medication in detail and the difference between the maintenance= "automatic" meds and the prns using an action plan format for the latter (If this problem/symptom => do that organization reading Left to right).  Please see AVS from this visit for a full list of these instructions which I personally wrote for this pt and  are unique to this visit.

## 2016-05-28 NOTE — Progress Notes (Signed)
Subjective:    Patient ID: Caitlyn [name], female    DOB: 05/15/1950,    MRN: [id number]  HPI  [id number] yobf quit smoking 2011 due to IHD but no resp symptoms at all then admtted  With MI 01/06/2016 and placed on ACEi and since then developed dry hacking cough and noct "wheeze" depite cpap per Dr Earl Galaosborne  and referred to pulmonary clinic 05/28/2016 by Dr   Faylene MillionVaradrajan to pulmonary clinic for MPNs on CT chest 04/18/16   05/29/2016 1st [facility] office visit/ Caitlyn [name]   Chief Complaint  Patient presents with  . pulmonary consult    per Dr. Sherlie BanVeradajan. CT 05/10/15. pt reports sob with exertion, occ prod cough with light green to yellow mucus & mild wheezing   doe = MMRC2 = can't walk a nl pace on a flat grade s sob but does fine slow and flat eg shopping   No obvious   patterns in day to day or daytime variabilty or assoc  cp or chest tightness,   overt sinus or hb symptoms. No unusual exp hx or h/o childhood pna/ asthma or knowledge of premature birth.   Also denies any obvious fluctuation of symptoms with weather or environmental changes or other aggravating or alleviating factors except as outlined above   Current Medications, Allergies, Complete Past Medical History, Past Surgical History, Family History, and Social History were reviewed in [facility] electronic medical record.          Review of Systems  Constitutional: Positive for unexpected weight change. Negative for fever.  HENT: Negative for congestion, dental problem, ear pain, nosebleeds, postnasal drip, rhinorrhea, sinus pressure, sneezing, sore throat and trouble swallowing.   Eyes: Negative for redness and itching.  Respiratory: Positive for cough, shortness of breath and wheezing. Negative for chest tightness.   Cardiovascular: Negative for palpitations and leg swelling.  Gastrointestinal: Negative for nausea and vomiting.  Genitourinary: Negative for dysuria.  Musculoskeletal: Negative for joint swelling.  Skin:  Negative for rash.  Neurological: Negative for headaches.  Hematological: Bruises/bleeds easily.  Psychiatric/Behavioral: Negative for dysphoric mood. The patient is not nervous/anxious.        Objective:   Physical Exam  amb bf with harsh throat clearing   Wt Readings from Last 3 Encounters:  05/28/16 272 lb 6.4 oz (123.6 kg)  01/08/16 273 lb 2.4 oz (123.9 kg)  10/08/15 266 lb 11.2 oz (121 kg)    Vital signs reviewed  - Note on arrival 02 sats  100% on RA      HEENT: nl dentition, turbinates bilaterally, and oropharynx. Nl external ear canals without cough reflex   NECK :  without JVD/Nodes/TM/ nl carotid upstrokes bilaterally   LUNGS: no acc muscle use,  Nl contour chest which is clear to A and P bilaterally without cough on insp or exp maneuvers   CV:  RRR  no s3 or murmur or increase in P2, and no edema   ABD:  soft and nontender with nl inspiratory excursion in the supine position. No bruits or organomegaly appreciated, bowel sounds nl  MS:  Nl gait/ ext warm without deformities, calf tenderness, cyanosis or clubbing No obvious joint restrictions   SKIN: warm and dry without lesions    NEURO:  alert, approp, nl sensorium with  no motor or cerebellar deficits apparent.     I personally reviewed images and agree with radiology impression as follows:  CTw/o contrast Chest 05/09/16 Emphysematous changes greatest at apices. Calcified granuloma RIGHT lower  lobe image 85. Mild central peribronchial thickening. Smoothly marginated 8 x 6 mm noncalcified nodule superior segment RIGHT lower lobe adjacent to major fissure previously 8.5 mm. 5 mm noncalcified RIGHT middle lobe nodule medially adjacent to pericardium image 75 unchanged. 8 x 5 mm smoothly marginated noncalcified LEFT lower lobe nodule adjacent to major fissure image 80 unchanged. Remaining lungs clear. No infiltrate, pleural effusion, pneumothorax or new mass/nodule.      Assessment & Plan:

## 2016-05-29 DIAGNOSIS — R05 Cough: Secondary | ICD-10-CM | POA: Insufficient documentation

## 2016-05-29 DIAGNOSIS — R058 Other specified cough: Secondary | ICD-10-CM | POA: Insufficient documentation

## 2016-05-29 NOTE — Assessment & Plan Note (Signed)
ACE inhibitors are problematic in  pts with airway complaints because  even experienced pulmonologists can't always distinguish ace effects from copd/asthma.  By themselves they don't actually cause a problem, much like oxygen can't by itself start a fire, but they certainly serve as a powerful catalyst or enhancer for any "fire"  or inflammatory process in the upper airway, be it caused by an ET  tube or more commonly reflux (especially in the obese or pts with known GERD or who are on biphoshonates).    In the era of ARB near equivalency until we have a better handle on the reversibility of the airway problem, it just makes sense to avoid ACEI  entirely in the short run and then decide later, having established a level of airway control using a reasonable limited regimen, whether to add back ace but even then being very careful to observe the pt for worsening airway control and number of meds used/ needed to control symptoms.    Try avapro 300 mg daily x 6 weeks > Follow up per Primary Care planned

## 2016-05-29 NOTE — Assessment & Plan Note (Addendum)
Spirometry 05/28/2016  FEV1 1.87 (85%)  Ratio 74 with min curvature   Cough by exam is classic Upper airway cough syndrome (previously labeled PNDS) , is  so named because it's frequently impossible to sort out how much is  CR/sinusitis with freq throat clearing (which can be related to primary GERD)   vs  causing  secondary (" extra esophageal")  GERD from wide swings in gastric pressure that occur with throat clearing, often  promoting self use of mint and menthol lozenges that reduce the lower esophageal sphincter tone and exacerbate the problem further in a cyclical fashion.   These are the same pts (now being labeled as having "irritable larynx syndrome" by some cough centers) who not infrequently have a history of having failed to tolerate ace inhibitors,  dry powder inhalers or biphosphonates or report having atypical/extraesophageal reflux symptoms that don't respond to standard doses of PPI  and are easily confused as having aecopd or asthma flares by even experienced allergists/ pulmonologists (myself included).   rec first try off acei x 6 weeks then regroup if still coughing

## 2016-05-29 NOTE — Assessment & Plan Note (Signed)
Body mass index is 43.97 kg/m Lab Results  Component Value Date   TSH 2.636 05/25/2015     Contributing to gerd risk/ doe/reviewed the need and the process to achieve and maintain neg calorie balance > defer f/u primary care including intermittently monitoring thyroid status

## 2016-08-25 ENCOUNTER — Other Ambulatory Visit: Payer: Self-pay | Admitting: Cardiology

## 2016-08-25 DIAGNOSIS — R079 Chest pain, unspecified: Secondary | ICD-10-CM

## 2016-09-10 ENCOUNTER — Encounter (HOSPITAL_COMMUNITY): Payer: 59

## 2016-09-11 ENCOUNTER — Other Ambulatory Visit (HOSPITAL_COMMUNITY): Payer: 59

## 2016-09-16 ENCOUNTER — Other Ambulatory Visit: Payer: Self-pay | Admitting: Internal Medicine

## 2016-09-16 DIAGNOSIS — R918 Other nonspecific abnormal finding of lung field: Secondary | ICD-10-CM

## 2016-09-19 ENCOUNTER — Other Ambulatory Visit: Payer: Self-pay | Admitting: Internal Medicine

## 2016-09-19 DIAGNOSIS — Z1231 Encounter for screening mammogram for malignant neoplasm of breast: Secondary | ICD-10-CM

## 2016-09-24 ENCOUNTER — Encounter (HOSPITAL_COMMUNITY)
Admission: RE | Admit: 2016-09-24 | Discharge: 2016-09-24 | Disposition: A | Discharge status: Home / Self Care | Payer: 59 | Source: Ambulatory Visit | Attending: Cardiology | Admitting: Cardiology

## 2016-09-24 ENCOUNTER — Ambulatory Visit
Admission: RE | Admit: 2016-09-24 | Discharge: 2016-09-24 | Disposition: A | Discharge status: Home / Self Care | Payer: 59 | Source: Ambulatory Visit | Attending: Internal Medicine | Admitting: Internal Medicine

## 2016-09-24 DIAGNOSIS — Z1231 Encounter for screening mammogram for malignant neoplasm of breast: Secondary | ICD-10-CM

## 2016-09-24 DIAGNOSIS — R079 Chest pain, unspecified: Secondary | ICD-10-CM | POA: Diagnosis not present

## 2016-09-24 MED ORDER — REGADENOSON 0.4 MG/5ML IV SOLN
0.4000 mg | Freq: Once | INTRAVENOUS | Status: AC
  Administered 2016-09-24: 0.4 mg via INTRAVENOUS

## 2016-09-24 MED ORDER — REGADENOSON 0.4 MG/5ML IV SOLN
INTRAVENOUS | Status: AC
  Administered 2016-09-24: 0.4 mg via INTRAVENOUS
  Filled 2016-09-24: qty 5

## 2016-09-25 ENCOUNTER — Encounter (HOSPITAL_COMMUNITY)
Admission: RE | Admit: 2016-09-25 | Discharge: 2016-09-25 | Disposition: A | Discharge status: Home / Self Care | Payer: 59 | Source: Ambulatory Visit | Attending: Cardiology | Admitting: Cardiology

## 2016-09-25 ENCOUNTER — Other Ambulatory Visit: Payer: Self-pay | Admitting: Internal Medicine

## 2016-09-25 DIAGNOSIS — R928 Other abnormal and inconclusive findings on diagnostic imaging of breast: Secondary | ICD-10-CM

## 2016-09-25 DIAGNOSIS — R079 Chest pain, unspecified: Secondary | ICD-10-CM | POA: Diagnosis not present

## 2016-09-25 MED ORDER — TECHNETIUM TC 99M TETROFOSMIN IV KIT
30.0000 | PACK | Freq: Once | INTRAVENOUS | Status: AC | PRN
  Administered 2016-09-25: 30 via INTRAVENOUS

## 2016-09-25 MED ORDER — TECHNETIUM TC 99M TETROFOSMIN IV KIT
30.0000 | PACK | Freq: Once | INTRAVENOUS | Status: AC
  Administered 2016-09-24: 30 via INTRAVENOUS

## 2016-10-01 ENCOUNTER — Ambulatory Visit
Admission: RE | Admit: 2016-10-01 | Discharge: 2016-10-01 | Disposition: A | Discharge status: Home / Self Care | Payer: 59 | Source: Ambulatory Visit | Attending: Internal Medicine | Admitting: Internal Medicine

## 2016-10-01 ENCOUNTER — Other Ambulatory Visit: Payer: Self-pay | Admitting: Internal Medicine

## 2016-10-01 DIAGNOSIS — R928 Other abnormal and inconclusive findings on diagnostic imaging of breast: Secondary | ICD-10-CM

## 2016-10-01 DIAGNOSIS — N6489 Other specified disorders of breast: Secondary | ICD-10-CM

## 2016-10-06 ENCOUNTER — Encounter: Payer: Self-pay | Admitting: Family

## 2016-10-16 ENCOUNTER — Ambulatory Visit (INDEPENDENT_AMBULATORY_CARE_PROVIDER_SITE_OTHER): Payer: 59 | Admitting: Family

## 2016-10-16 ENCOUNTER — Ambulatory Visit (HOSPITAL_COMMUNITY)
Admission: RE | Admit: 2016-10-16 | Discharge: 2016-10-16 | Disposition: A | Discharge status: Home / Self Care | Payer: 59 | Source: Ambulatory Visit | Attending: Family | Admitting: Family

## 2016-10-16 ENCOUNTER — Encounter: Payer: Self-pay | Admitting: Family

## 2016-10-16 VITALS — BP 111/68 | HR 53 | Temp 97.2°F | Resp 16 | Ht 66.0 in | Wt 272.0 lb

## 2016-10-16 DIAGNOSIS — I739 Peripheral vascular disease, unspecified: Secondary | ICD-10-CM | POA: Diagnosis not present

## 2016-10-16 DIAGNOSIS — R9439 Abnormal result of other cardiovascular function study: Secondary | ICD-10-CM | POA: Diagnosis not present

## 2016-10-16 DIAGNOSIS — I779 Disorder of arteries and arterioles, unspecified: Secondary | ICD-10-CM

## 2016-10-16 DIAGNOSIS — Z87891 Personal history of nicotine dependence: Secondary | ICD-10-CM

## 2016-10-16 NOTE — Patient Instructions (Addendum)
Intermittent Claudication Intermittent claudication is pain in your leg that occurs when you walk or exercise and goes away when you rest. The pain can occur in one or both legs. What are the causes? Intermittent claudication is caused by the buildup of plaque within the major arteries in the body (atherosclerosis). The plaque, which makes arteries stiff and narrow, prevents enough blood from reaching your leg muscles. The pain occurs when you walk or exercise because your muscles need more blood when you are moving and exercising. What increases the risk? Risk factors include:  A family history of atherosclerosis.  A personal history of stroke or heart disease.  Older age.  Being inactive or overweight.  Smoking cigarettes.  Having another health condition such as: ? Diabetes. ? High blood pressure. ? High cholesterol.  What are the signs or symptoms? Your hip or leg may:  Ache.  Cramp.  Feel tight.  Feel weak.  Feel heavy.  Over time, you may feel pain in your calf, thigh, or hip. How is this diagnosed? Your health care provider may diagnose intermittent claudication based on your symptoms and medical history. Your health care provider may also do tests to learn more about your condition. These may include:  Blood tests.  An ultrasound.  Imaging tests such as angiography, magnetic resonance angiography (MRA), and computed tomography angiography (CTA).  How is this treated? You may be treated for problems such as:  High blood pressure.  High cholesterol.  Diabetes.  Other treatments may include:  Lifestyle changes such as: ? Starting an exercise program. ? Losing weight. ? Quitting smoking.  Medicines to help restore blood flow through your legs.  Blood vessel surgery (angioplasty) to restore blood flow if your intermittent claudication is caused by severe peripheral artery disease.  Follow these instructions at home:  Manage any other health  conditions you have.  Eat a diet low in saturated fats and calories to maintain a healthy weight.  Quit smoking, if you smoke.  Take medicines only as directed by your health care provider.  If your health care provider recommended an exercise program for you, follow it as directed. Your exercise program may involve: ? Walking three or more times a week. ? Walking until you have certain symptoms of intermittent claudication. ? Resting until symptoms go away. ? Gradually increasing walking time to about 50 minutes a day. Contact a health care provider if: Your condition is not getting better or is getting worse. Get help right away if:  You have chest pain.  You have difficulty breathing.  You develop arm weakness.  You have trouble speaking.  Your face begins to droop. This information is not intended to replace advice given to you by your health care provider. Make sure you discuss any questions you have with your health care provider. Document Released: 01/04/2004 Document Revised: 08/09/2015 Document Reviewed: 06/09/2013 Elsevier Interactive Patient Education  2017 [employer]  

## 2016-10-16 NOTE — Progress Notes (Signed)
[facility] [place]   CC: Follow up peripheral artery occlusive disease  History of Present Illness Caitlyn [name] is a 66 y.o. female patient of Dr. Darrick PennaFields who is s/p arteriogram on 04/06/15 for right leg pain. Arteriogram findings and recommendations were: Patient has a short segment right superficial femoral artery origin stenosis of about 70%. This may be contributed to some of her symptoms but probably does not explain all of her symptoms. I (Dr. Darrick PennaFields) believe it is reasonable for medical management of this currently. As angioplasty of this could have short durability and if she develops restenosis she may overall have worse symptoms in the right leg. We will have her follow-up with us in 6 months time for repeat ABIs. In the meanwhile she will try to lose some weight control her hypertension and cholesterol. She will also need to be continued to monitor for development of diabetes due to her obesity. She also has has some evidence of tibial disease suggestive of intermittent hyperglycemia.  She returns today for follow up.  She has intermittent catching type pain in her left groin after standing for a while, and also after standing for a while it feels like both legs are weak and will give way. She states her legs feel better when she walks more.    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   She denies non healing wounds in her feet/legs.   Her PMHX includes CAD with stenting in 2011 after an MI, Graves Disease with history of ablation, morbid obesity, and former smoker.  Pt Diabetic: states she was told that she had prediabetes Pt smoker: former smoker, quit in 2010  Pt meds include: Statin :Yes, states she takes every other day Betablocker: Yes ASA: Yes Other anticoagulants/antiplatelets: Plavix    Past Medical History:  Diagnosis Date  . Anxiety   . Arthritis    "legs" (01/07/2016)  . CAD 06/2009   a.  s/p MI 4/11: tx with DES x 2 to RCA;  b.  LHC 03/17/11: LAD 30-40%, distal LAD 30-40%, OM2 40-50%, RCA stent patent, EF greater than 70%.;  c.  Lexiscan Myoview (12/15):  Mild apical thinning, no ischemia, EF 61%; normal study  . CAROTID STENOSIS    carotid Dopplers 03/25/11: RICA 60-79%; LICA 0-39%.  Follow up recommended in 6 months.  . Depression   . Gout    "on RX for it" (01/07/2016)  . HYPERLIPIDEMIA   . HYPERTENSION   . HYPOTHYROIDISM    post ablation tx Graves  . Myocardial infarction [facility](H[facility]) 2012   "mini"  . OSA on CPAP   . PVD   . TOBACCO ABUSE quit 06/2009  . Varicose veins   . VITAMIN D DEFICIENCY    DEXA 04/2009 normal    Social History Social History  Substance Use Topics  . Smoking status: Former Smoker    Packs/day: 1.00    Years: 35.00    Quit date: 06/30/2009  . Smokeless tobacco: Never Used  . Alcohol use 1.2 oz/week    2 Glasses of wine per week     Comment: occ    Family History Family History  Problem Relation Age of Onset  . Heart attack Mother   . Stroke Mother   . Cancer Mother   . Varicose Veins Mother   . Cancer Father   . Coronary artery disease Other   .  Hypertension Other   . Hypertension Sister     Past Surgical History:  Procedure Laterality Date  . CARDIAC CATHETERIZATION N/A 01/07/2016   Procedure: Left Heart Cath and Coronary Angiography;  Surgeon: Rinaldo CloudMohan Harwani, MD;  [facility];  Service: Cardiovascular;  Laterality: N/A;  . CARDIAC CATHETERIZATION N/A 01/07/2016   Procedure: Coronary Stent Intervention;  Surgeon: Rinaldo CloudMohan Harwani, MD;  Location: [facility] INVAS[facility] CV LAB;  Service: Cardiovascular;  Laterality: N/A;  . CAROTID STENT Left    bilateral carotid artery disease post stent of left carotid  . CESAREAN SECTION  1985   "twins"  . CORONARY ANGIOPLASTY    . LEFT HEART CATHETERIZATION WITH CORONARY ANGIOGRAM N/A 03/17/2011   Procedure: LEFT HEART CATHETERIZATION WITH CORONARY  ANGIOGRAM;  Surgeon: Herby Abrahamhomas D Stuckey, MD;  [facility];  Service: Cardiovascular;  Laterality: N/A;  . PERIPHERAL VASCULAR CATHETERIZATION N/A 04/06/2015   Procedure: Abdominal Aortogram;  Surgeon: Sherren Kernsharles E Fields, MD;  [facility];  Service: Cardiovascular;  Laterality: N/A;  . RADIOACTIVE PLAQUE INSERTION     Graves disease post radioactive treatment complicated hypothyroidism    Allergies  Allergen Reactions  . Amlodipine Swelling  . Statins Other (See Comments)    myalgia    Current Outpatient Prescriptions  Medication Sig Dispense Refill  . acetaminophen (TYLENOL) 500 MG tablet Take 1,000 mg by mouth every 6 (six) hours as needed for moderate pain.    Marland Kitchen. albuterol (PROVENTIL HFA;VENTOLIN HFA) 108 (90 BASE) MCG/ACT inhaler Inhale 2 puffs into the lungs every 6 (six) hours as needed for wheezing or shortness of breath.     . allopurinol (ZYLOPRIM) 100 MG tablet Take 1 tablet (100 mg total) by mouth daily. (Patient taking differently: Take 300 mg by mouth daily. ) 90 tablet 0  . aspirin 81 MG tablet Take 81 mg by mouth daily. Reported on 04/04/2015    . atorvastatin (LIPITOR) 20 MG tablet Take 20 mg by mouth daily.     . clonazePAM (KLONOPIN) 1 MG tablet Take 1 mg by mouth at bedtime.     . colchicine (COLCRYS) 0.6 MG tablet Take 0.6 mg by mouth daily as needed (for flare ups). Flare ups    . fluticasone (FLONASE) 50 MCG/ACT nasal spray Place 2 sprays into both nostrils daily as needed for allergies or rhinitis.     Marland Kitchen. irbesartan (AVAPRO) 300 MG tablet Take 1 tablet (300 mg total) by mouth daily. 30 tablet 11  . levothyroxine (SYNTHROID, LEVOTHROID) 125 MCG tablet Take 125 mcg by mouth daily before breakfast.    . methocarbamol (ROBAXIN) 500 MG tablet Take 500 mg by mouth every 8 (eight) hours as needed for muscle spasms.     . metoprolol succinate (TOPROL-XL) 25 MG 24 hr tablet Take 50 mg by mouth daily.     . nitroGLYCERIN (NITROSTAT) 0.4 MG SL tablet Place  0.4 mg under the tongue every 5 (five) minutes as needed for chest pain.     . Pediatric Multivit-Minerals-C (GUMMI BEAR MULTIVITAMIN/MIN) CHEW Chew 2 tablets by mouth daily.    . ticagrelor (BRILINTA) 90 MG TABS tablet Take 1 tablet (90 mg total) by mouth 2 (two) times daily. 60 tablet 3  . Vitamin D, Ergocalciferol, (DRISDOL) 50000 units CAPS capsule Take 50,000 Units by mouth every Monday.    . zolpidem (AMBIEN) 10 MG tablet Take 1 tablet (10 mg total) by mouth at bedtime as needed for sleep. 30 tablet 5   No current facility-administered  medications for this visit.     ROS: See HPI for pertinent positives and negatives.   Physical Examination  Vitals:   10/16/16 1535  BP: 111/68  Pulse: (!) 53  Resp: 16  Temp: (!) 97.2 F (36.2 C)  SpO2: 98%  Weight: 272 lb (123.4 kg)  Height: 5\' 6"  (1.676 m)   Body mass index is 43.9 kg/m.  General: A&O x 3, WDWN, morbidly obese female. Gait: normal Eyes: PERRLA. Bilateral exophthalmos.  Pulmonary: CTAB, without wheezes , rales or rhonchi. Cardiac: regular rhythm, bradycardic (on a beta blocker), + murmur.         Carotid Bruits Right Left   Negative Negative   Abdominal aortic pulse is not palpable. Radial pulses: 2+ palpable and =                           VASCULAR EXAM: Extremities without ischemic changes, without Gangrene; without open wounds.                                                                                                                                                       LE Pulses Right Left       FEMORAL  not palpable (obese)  not palpable (obese)        POPLITEAL  not  palpable    Not palpable       POSTERIOR TIBIAL  not palpable    palpable        DORSALIS PEDIS      ANTERIOR TIBIAL not palpable  not palpable    Abdomen: softly obese, NT, no palpable masses. Skin: no rashes, no ulcers. Musculoskeletal: no muscle wasting or atrophy.         Neurologic: A&O X 3; appropriate affect ,  sensation is normal; moving all extremities equally, motor strength 5/5 throughout. Speech is fluent/normal.  CN 2-12 intact. Pain elicited in low back and left hip with straight leg raise test in left leg.    ASSESSMENT: Caitlyn [name] is a 66 y.o. female a short segment right superficial femoral artery origin stenosis of about 70% found on arteriogram in January 2017. This may contribute to some of her symptoms but probably does not explain all of her symptoms.  She has known arthritis in her back, had therapy for this at one time which helped her symptoms.  Pain elicited in low back and left hip with straight leg raise test in left leg; consider referral to neurosurgeon for evaluation of her lumbar spine; will defer to pt's PCP.  Her legs feel equally weak the longer she stands at her job on her feet; her legs feel better when she walks.  She has no signs of ischemia in her feet/legs.  Her atherosclerotic risk factors include CAD with stenting  in 2011 after an MI, morbid obesity, and former smoker.  She is considering bariatric surgery.  She requested referral to Medical Weight Management, given referral to Dr. Lady Gary.    DATA   ABI (Date: 10/16/2016)  R:   ABI: 0.67 (was 0.72 on 10-08-15),   PT: mono (was mono)  DP: mono (was mono)  TBI:  0.45 (was 0.50)  L:   ABI: 1.03 (was 1.07),   PT: bi (was tri)  DP: bi (was tri)  TBI: 0.75 (was 0.69) Stable bilateral ABI: right with moderate arterial occlusive disease, left is normal.   PLAN:  Based on the patient's vascular studies and examination, pt will return to clinic in 1 year with ABI's. I advised her to notify us if she develops concerns re the circulation in her feet or legs.   I discussed in depth with the patient the nature of atherosclerosis, and emphasized the importance of maximal medical management including strict control of blood pressure, blood glucose, and lipid levels, obtaining regular  exercise, and continued cessation of smoking.  The patient is aware that without maximal medical management the underlying atherosclerotic disease process will progress, limiting the benefit of any interventions.  The patient was given information about PAD including signs, symptoms, treatment, what symptoms should prompt the patient to seek immediate medical care, and risk reduction measures to take.  Charisse March, RN, MSN, FNP-C [facility] [place] Phone: [phone]  Clinic MD: Darrick Penna  10/16/16 3:37 PM

## 2016-10-20 NOTE — Addendum Note (Signed)
Addended by: Burton ApleyPETTY, Georgeana Oertel A on: 10/20/2016 03:51 PM   Modules accepted: Orders

## 2016-11-07 ENCOUNTER — Ambulatory Visit (INDEPENDENT_AMBULATORY_CARE_PROVIDER_SITE_OTHER)
Admission: RE | Admit: 2016-11-07 | Discharge: 2016-11-07 | Disposition: A | Discharge status: Home / Self Care | Payer: 59 | Source: Ambulatory Visit | Attending: Internal Medicine | Admitting: Internal Medicine

## 2016-11-07 DIAGNOSIS — R918 Other nonspecific abnormal finding of lung field: Secondary | ICD-10-CM | POA: Diagnosis not present

## 2016-11-07 NOTE — Progress Notes (Signed)
Spoke with pt and notified of results per Dr. Wert. Pt verbalized understanding and denied any questions. 

## 2017-01-12 ENCOUNTER — Other Ambulatory Visit: Payer: Self-pay | Admitting: Internal Medicine

## 2017-01-12 ENCOUNTER — Ambulatory Visit
Admission: RE | Admit: 2017-01-12 | Discharge: 2017-01-12 | Disposition: A | Discharge status: Home / Self Care | Payer: 59 | Source: Ambulatory Visit | Attending: Internal Medicine | Admitting: Internal Medicine

## 2017-01-12 DIAGNOSIS — M5441 Lumbago with sciatica, right side: Secondary | ICD-10-CM

## 2017-04-08 ENCOUNTER — Inpatient Hospital Stay
Admission: RE | Admit: 2017-04-08 | Discharge: 2017-04-08 | Disposition: A | Discharge status: Home / Self Care | Payer: 59 | Source: Ambulatory Visit | Attending: Internal Medicine | Admitting: Internal Medicine

## 2017-04-08 ENCOUNTER — Other Ambulatory Visit: Payer: 59

## 2017-04-15 ENCOUNTER — Ambulatory Visit
Admission: RE | Admit: 2017-04-15 | Discharge: 2017-04-15 | Disposition: A | Discharge status: Home / Self Care | Payer: BLUE CROSS/BLUE SHIELD | Source: Ambulatory Visit | Attending: Internal Medicine | Admitting: Internal Medicine

## 2017-04-15 ENCOUNTER — Other Ambulatory Visit: Payer: Self-pay | Admitting: Internal Medicine

## 2017-04-15 ENCOUNTER — Ambulatory Visit
Admission: RE | Admit: 2017-04-15 | Discharge: 2017-04-15 | Disposition: A | Discharge status: Home / Self Care | Payer: Self-pay | Source: Ambulatory Visit | Attending: Internal Medicine | Admitting: Internal Medicine

## 2017-04-15 DIAGNOSIS — N6489 Other specified disorders of breast: Secondary | ICD-10-CM

## 2017-04-15 DIAGNOSIS — N63 Unspecified lump in unspecified breast: Secondary | ICD-10-CM

## 2017-06-23 ENCOUNTER — Other Ambulatory Visit: Payer: Self-pay | Admitting: Cardiology

## 2017-06-23 DIAGNOSIS — R079 Chest pain, unspecified: Secondary | ICD-10-CM

## 2017-06-29 ENCOUNTER — Ambulatory Visit (HOSPITAL_COMMUNITY)
Admission: RE | Admit: 2017-06-29 | Discharge: 2017-06-29 | Disposition: A | Discharge status: Home / Self Care | Payer: BLUE CROSS/BLUE SHIELD | Source: Ambulatory Visit | Attending: Cardiology | Admitting: Cardiology

## 2017-06-29 DIAGNOSIS — I2129 ST elevation (STEMI) myocardial infarction involving other sites: Secondary | ICD-10-CM | POA: Insufficient documentation

## 2017-06-29 DIAGNOSIS — R079 Chest pain, unspecified: Secondary | ICD-10-CM | POA: Insufficient documentation

## 2017-06-29 LAB — LIPID PANEL
CHOLESTEROL: 188 mg/dL (ref 0–200)
HDL: 43 mg/dL (ref >40)
LDL Cholesterol: 104 mg/dL — ABNORMAL HIGH (ref 0–99)
Total CHOL/HDL Ratio: 4.4 RATIO
Triglycerides: 207 mg/dL — ABNORMAL HIGH (ref <150)
VLDL: 41 mg/dL — ABNORMAL HIGH (ref 0–40)

## 2017-06-29 LAB — BASIC METABOLIC PANEL
Anion gap: 10 (ref 5–15)
BUN: 20 mg/dL (ref 6–20)
CO2: 26 mmol/L (ref 22–32)
Calcium: 9.3 mg/dL (ref 8.9–10.3)
Chloride: 105 mmol/L (ref 101–111)
Creatinine, Ser: 0.96 mg/dL (ref 0.44–1.00)
GFR calc Af Amer: >60 mL/min (ref >60)
GFR calc non Af Amer: >60 mL/min (ref >60)
GLUCOSE: 99 mg/dL (ref 65–99)
Potassium: 4.2 mmol/L (ref 3.5–5.1)
Sodium: 141 mmol/L (ref 135–145)

## 2017-06-29 LAB — HEPATIC FUNCTION PANEL
ALT: 19 U/L (ref 14–54)
AST: 20 U/L (ref 15–41)
Albumin: 3.7 g/dL (ref 3.5–5.0)
Alkaline Phosphatase: 83 U/L (ref 38–126)
BILIRUBIN TOTAL: 0.7 mg/dL (ref 0.3–1.2)
Bilirubin, Direct: <0.1 mg/dL — ABNORMAL LOW (ref 0.1–0.5)
Total Protein: 7.5 g/dL (ref 6.5–8.1)

## 2017-06-29 MED ORDER — REGADENOSON 0.4 MG/5ML IV SOLN
0.4000 mg | Freq: Once | INTRAVENOUS | Status: AC
  Administered 2017-06-29: 0.4 mg via INTRAVENOUS

## 2017-06-29 MED ORDER — TECHNETIUM TC 99M TETROFOSMIN IV KIT
10.0000 | PACK | Freq: Once | INTRAVENOUS | Status: AC | PRN
  Administered 2017-06-29: 10 via INTRAVENOUS

## 2017-06-29 MED ORDER — TECHNETIUM TC 99M TETROFOSMIN IV KIT
30.0000 | PACK | Freq: Once | INTRAVENOUS | Status: AC | PRN
  Administered 2017-06-29: 30 via INTRAVENOUS

## 2017-06-29 MED ORDER — REGADENOSON 0.4 MG/5ML IV SOLN
INTRAVENOUS | Status: AC
  Administered 2017-06-29: 0.4 mg via INTRAVENOUS
  Filled 2017-06-29: qty 5

## 2017-10-13 ENCOUNTER — Other Ambulatory Visit: Payer: BLUE CROSS/BLUE SHIELD

## 2017-10-14 ENCOUNTER — Ambulatory Visit
Admission: RE | Admit: 2017-10-14 | Discharge: 2017-10-14 | Disposition: A | Discharge status: Home / Self Care | Payer: BLUE CROSS/BLUE SHIELD | Source: Ambulatory Visit | Attending: Internal Medicine | Admitting: Internal Medicine

## 2017-10-14 DIAGNOSIS — N63 Unspecified lump in unspecified breast: Secondary | ICD-10-CM

## 2017-10-15 ENCOUNTER — Ambulatory Visit (HOSPITAL_COMMUNITY)
Admission: RE | Admit: 2017-10-15 | Discharge: 2017-10-15 | Disposition: A | Discharge status: Home / Self Care | Payer: BLUE CROSS/BLUE SHIELD | Source: Ambulatory Visit | Attending: Family | Admitting: Family

## 2017-10-15 ENCOUNTER — Other Ambulatory Visit: Payer: Self-pay

## 2017-10-15 ENCOUNTER — Encounter: Payer: Self-pay | Admitting: Family

## 2017-10-15 ENCOUNTER — Ambulatory Visit (INDEPENDENT_AMBULATORY_CARE_PROVIDER_SITE_OTHER): Payer: BLUE CROSS/BLUE SHIELD | Admitting: Family

## 2017-10-15 VITALS — BP 109/65 | HR 67 | Temp 97.8°F | Ht 66.0 in | Wt 278.0 lb

## 2017-10-15 DIAGNOSIS — M5432 Sciatica, left side: Secondary | ICD-10-CM | POA: Diagnosis not present

## 2017-10-15 DIAGNOSIS — Z87891 Personal history of nicotine dependence: Secondary | ICD-10-CM | POA: Insufficient documentation

## 2017-10-15 DIAGNOSIS — I779 Disorder of arteries and arterioles, unspecified: Secondary | ICD-10-CM | POA: Insufficient documentation

## 2017-10-15 NOTE — Progress Notes (Signed)
[facility] [place]   CC: Follow up peripheral artery occlusive disease  History of Present Illness Caitlyn [name] is a 67 y.o. female who is s/p arteriogram on 04/06/15 for right leg pain by Dr. Darrick PennaFields. Arteriogram findings and recommendations were: Patient has a short segment right superficial femoral artery origin stenosis of about 70%. This may be contributed to some of her symptoms but probably does not explain all of her symptoms. I (Dr. Casilda CarlsFields)believe it is reasonable for medical management of this currently. As angioplasty of this could have short durability and if she develops restenosis she may overall have worse symptoms in the right leg. We will have her follow-up with us in 6 months time for repeat ABIs. In the meanwhile she will try to lose some weight control her hypertension and cholesterol. She will also need to be continued to monitor for development of diabetes due to her obesity. She also has has some evidence of tibial disease suggestive of intermittent hyperglycemia.  She returns today for follow up.  She has intermittent catching type pain in her left groin after standing for a while, and also after standing for a while it feels like both legs are weak and will give way. She states her legs feel better when she walks more.    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   She denies non healing wounds in her feet/legs.   Her PMHX includes CAD with stenting in 2011 after an MI, Graves Disease with history of ablation, morbid obesity, and former smoker.  Diabetic: states she was told that she had prediabetes Tobacco use: former smoker, quit in 2010  Pt meds include: Statin :Yes, states she takes every other day, makes her legs hurt Betablocker: Yes ASA: Yes Other anticoagulants/antiplatelets: Plavix     Past Medical History:  Diagnosis Date  . Anxiety   .  Arthritis    "legs" (01/07/2016)  . CAD 06/2009   a.  s/p MI 4/11: tx with DES x 2 to RCA;  b.  LHC 03/17/11: LAD 30-40%, distal LAD 30-40%, OM2 40-50%, RCA stent patent, EF greater than 70%.;  c.  Lexiscan Myoview (12/15):  Mild apical thinning, no ischemia, EF 61%; normal study  . CAROTID STENOSIS    carotid Dopplers 03/25/11: RICA 60-79%; LICA 0-39%.  Follow up recommended in 6 months.  . Depression   . Gout    "on RX for it" (01/07/2016)  . HYPERLIPIDEMIA   . HYPERTENSION   . HYPOTHYROIDISM    post ablation tx Graves  . Myocardial infarction [facility](HCC) 2012   "mini"  . OSA on CPAP   . PVD   . TOBACCO ABUSE quit 06/2009  . Varicose veins   . VITAMIN D DEFICIENCY    DEXA 04/2009 normal    Social History Social History   Tobacco Use  . Smoking status: Former Smoker    Packs/day: 1.00    Years: 35.00    Pack years: 35.00    Last attempt to quit: 06/30/2009    Years since quitting: 8.2  . Smokeless tobacco: Never Used  Substance Use Topics  . Alcohol use: Yes    Alcohol/week: 1.2 oz    Types: 2 Glasses of wine per week    Comment: occ  . Drug use: No    Family History Family History  Problem Relation Age of Onset  . Heart  attack Mother   . Stroke Mother   . Cancer Mother   . Varicose Veins Mother   . Cancer Father   . Coronary artery disease Other   . Hypertension Other   . Hypertension Sister     Past Surgical History:  Procedure Laterality Date  . CARDIAC CATHETERIZATION N/A 01/07/2016   Procedure: Left Heart Cath and Coronary Angiography;  Surgeon: Rinaldo Cloud, MD;  Location: [facility] INVASIVE CV LAB;  Service: Cardiovascular;  Laterality: N/A;  . CARDIAC CATHETERIZATION N/A 01/07/2016   Procedure: Coronary Stent Intervention;  Surgeon: Rinaldo Cloud, MD;  Location: [facility] INVASIVE CV LAB;  Service: Cardiovascular;  Laterality: N/A;  . CAROTID STENT Left    bilateral carotid artery disease post stent of left carotid  . CESAREAN SECTION  1985   "twins"  . CORONARY  ANGIOPLASTY    . LEFT HEART CATHETERIZATION WITH CORONARY ANGIOGRAM N/A 03/17/2011   Procedure: LEFT HEART CATHETERIZATION WITH CORONARY ANGIOGRAM;  Surgeon: Herby Abraham, MD;  Location: [facility] CATH LAB;  Service: Cardiovascular;  Laterality: N/A;  . PERIPHERAL VASCULAR CATHETERIZATION N/A 04/06/2015   Procedure: Abdominal Aortogram;  Surgeon: Sherren Kerns, MD;  Location: [facility] INVASIVE CV LAB;  Service: Cardiovascular;  Laterality: N/A;  . RADIOACTIVE PLAQUE INSERTION     Graves disease post radioactive treatment complicated hypothyroidism    Allergies  Allergen Reactions  . Amlodipine Swelling  . Statins Other (See Comments)    myalgia    Current Outpatient Medications  Medication Sig Dispense Refill  . acetaminophen (TYLENOL) 500 MG tablet Take 1,000 mg by mouth every 6 (six) hours as needed for moderate pain.    Marland Kitchen allopurinol (ZYLOPRIM) 100 MG tablet Take 1 tablet (100 mg total) by mouth daily. (Patient taking differently: Take 300 mg by mouth daily. ) 90 tablet 0  . aspirin 81 MG tablet Take 81 mg by mouth daily. Reported on 04/04/2015    . atorvastatin (LIPITOR) 20 MG tablet Take 20 mg by mouth daily.     . clonazePAM (KLONOPIN) 1 MG tablet Take 1 mg by mouth at bedtime.     . colchicine (COLCRYS) 0.6 MG tablet Take 0.6 mg by mouth daily as needed (for flare ups). Flare ups    . levothyroxine (SYNTHROID, LEVOTHROID) 125 MCG tablet Take 125 mcg by mouth daily before breakfast.    . methocarbamol (ROBAXIN) 500 MG tablet Take 500 mg by mouth every 8 (eight) hours as needed for muscle spasms.     . metoprolol succinate (TOPROL-XL) 25 MG 24 hr tablet Take 50 mg by mouth daily.     . nitroGLYCERIN (NITROSTAT) 0.4 MG SL tablet Place 0.4 mg under the tongue every 5 (five) minutes as needed for chest pain.     . Pediatric Multivit-Minerals-C (GUMMI BEAR MULTIVITAMIN/MIN) CHEW Chew 2 tablets by mouth daily.    . Vitamin D, Ergocalciferol, (DRISDOL) 50000 units CAPS capsule Take 50,000 Units  by mouth every Monday.    Marland Kitchen albuterol (PROVENTIL HFA;VENTOLIN HFA) 108 (90 BASE) MCG/ACT inhaler Inhale 2 puffs into the lungs every 6 (six) hours as needed for wheezing or shortness of breath.      No current facility-administered medications for this visit.     ROS: See HPI for pertinent positives and negatives.   Physical Examination  Vitals:   10/15/17 1437 10/15/17 1442  BP: (!) 131/56 109/65  Pulse: 67   Temp: 97.8 F (36.6 C)   TempSrc: Oral   SpO2: 98%   Weight: 278 lb (126.1  kg)   Height: 5\' 6"  (1.676 m)    Body mass index is 44.87 kg/m.  General: A&O x 3, WDWN, morbidly obese female. Gait: normal Eyes: PERRLA. Bilateral exophthalmos. Pulmonary: CTAB, without wheezes , rales or rhonchi. Cardiac: regular rhythm, bradycardic (on a beta blocker), +murmur.    Carotid Bruits Right Left   Negative positive   Abdominal aortic pulse is notpalpable. Radial pulses: 2+ palpable and =  VASCULAR EXAM: Extremitieswithoutischemic changes, withoutGangrene; withoutopen wounds.  LE Pulses Right Left  FEMORAL notpalpable (obese) notpalpable (obese)   POPLITEAL not palpable  Not palpable  POSTERIOR TIBIAL notpalpable  palpable   DORSALIS PEDIS ANTERIOR TIBIAL notpalpable  notpalpable    Abdomen: softly obese, NT, no palpable masses. Skin: no rashes, no ulcers. Musculoskeletal: no muscle wasting or atrophy. Neurologic: A&O X 3; appropriate affect , sensation is normal; moving all extremities equally, motor strength 5/5 throughout. Speech is fluent/normal.  CN 2-12 intact. Pain elicited in low back and left hip with straight leg raise test in left leg.  Skin: no rashes, no cellulitis, no ulcers noted. Musculoskeletal: no muscle wasting or atrophy.  Neurologic: A&O X 3; appropriate affect, Sensation is normal; MOTOR FUNCTION:  moving all extremities equally, motor  strength 5/5 throughout. Speech is fluent/normal. CN 2-12 intact. Psychiatric: Thought content is normal, mood appropriate for clinical situation.     ASSESSMENT: Caitlyn [name] is a 67 y.o. female with a short segment right superficial femoral artery origin stenosis of about 70% found on arteriogram in January 2017. This may contributeto some of her symptoms but probably does not explain all of her symptoms.  She has known arthritis in her back, had therapy for this at one time which helped her symptoms.  Pain elicited in low back and left hip with straight leg raise test in left leg; she sees a spine specialist, Dr. Luiz Blare with [facility].   She has no signs of ischemia in her feet/legs.  Her atherosclerotic risk factors include CAD with stenting in 2011 after an MI, morbid obesity, and former smoker.  She is considering bariatric surgery.  She requested referral to Medical Weight Management, given referral to Dr. Lady Gary at a previous visit, but pt states she knows nothing of this.    0.96 serum creatinine on 06-29-17.   I discussed with pt possible intervention and diagnostics in the form of aortogram with bilateral run off, possible intervention with stenting or angioplasty, but she prefers conserative measures: daily seated leg exercises, will look into walking in a swimming pool, referral to medical weight loss. She is already seeing a spine specialist for arthritis in her back and left sciatica.   DATA  ABI (Date: 10/15/2017):  R:   ABI: 0.54 (was 0.67 on 10-16-16),   PT: mono  DP: mono  TBI:  0.34, toe pressure 42 (was 0.45)  L:   ABI: 0.73 (was 1.03),   PT: bi  DP: bi  TBI: 0.58, toe pressure 71 (was 0.75)  Decline in bilateral ABI and TBI, moderate disease bilaterally.  Right with monophasic waveforms, left with biphasic.     PLAN:  Daily seated leg exercises discussed and demonstrated, in lieu of graduated walking program since her left  groin hurts when she walks.  Referred again to Dr. Quillian Quince, Medical Weight Management: morbidly obese, worsening arthritis in her back, left sciatic pain, walking less due to sciatica, worsening PAD due to walking less.   Based on the patient's vascular studies and examination, pt  will return to clinic in 3 months with ABI's and bilateral LE arterial duplex.  I advised her to notify us if she develops concerns re the circulation in her feet or leg.   I discussed in depth with the patient the nature of atherosclerosis, and emphasized the importance of maximal medical management including strict control of blood pressure, blood glucose, and lipid levels, obtaining regular exercise, and continued cessation of smoking.  The patient is aware that without maximal medical management the underlying atherosclerotic disease process will progress, limiting the benefit of any interventions.  The patient was given information about PAD including signs, symptoms, treatment, what symptoms should prompt the patient to seek immediate medical care, and risk reduction measures to take.  Charisse March, RN, MSN, FNP-C [facility] [place] Phone: [phone]  Clinic MD: Darrick Penna  10/15/17 2:49 PM

## 2017-10-15 NOTE — Patient Instructions (Signed)

## 2017-11-13 IMAGING — MR MR LUMBAR SPINE W/O CM
4 of 5 series · 26 of 48 positions shown · non-contrast
Comparison: None.

CLINICAL DATA: Lumbar spinal stenosis. Low back pain with leg
weakness

EXAM:
MRI LUMBAR SPINE WITHOUT CONTRAST
TECHNIQUE: Multiplanar, multisequence MR imaging of the lumbar spine was
performed. No intravenous contrast was administered.

[Series 4: T2 · sagittal · 4.0mm · 0.42mm/px · 6 of 14 slices shown (1 of 2)]
[im 1/14]
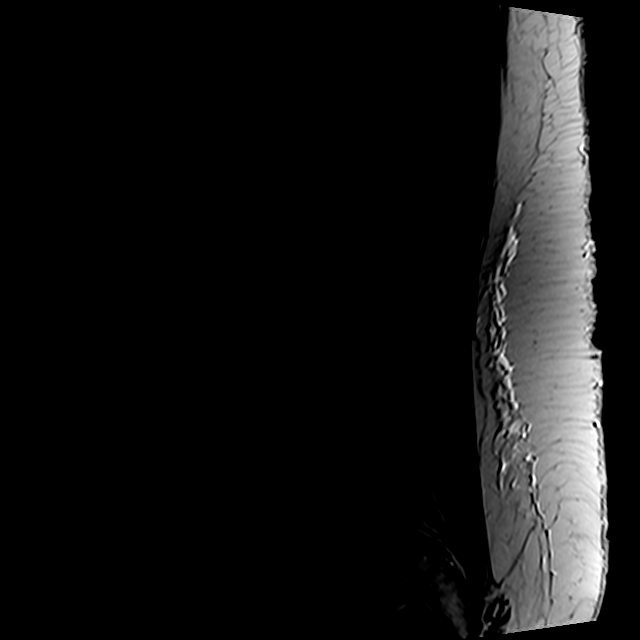
[im 3/14]
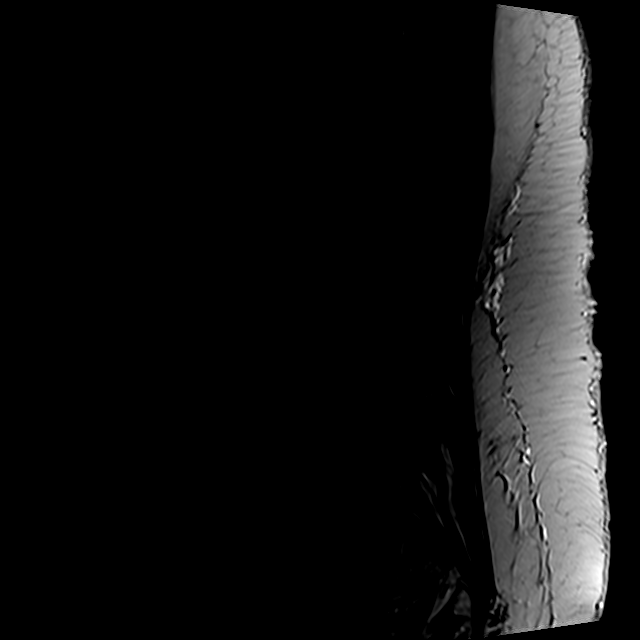
[im 6/14]
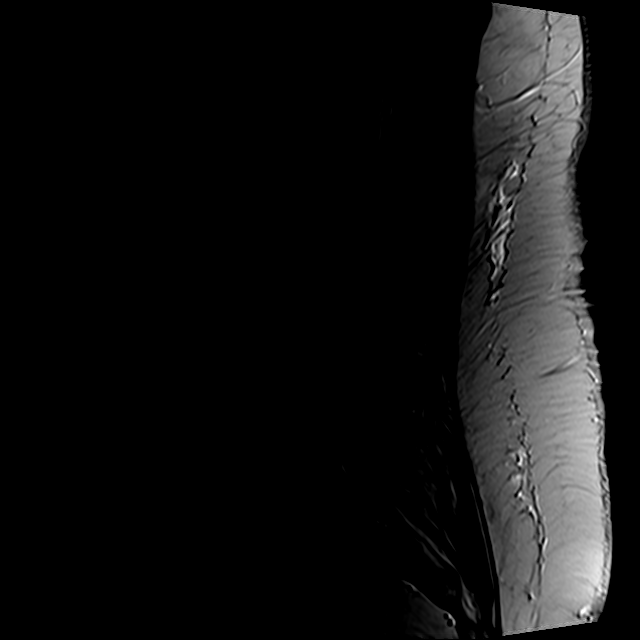
[im 8/14]
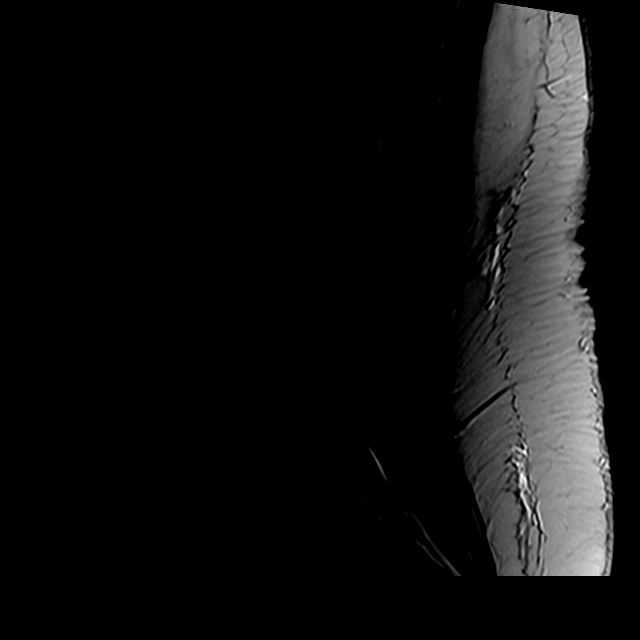
[im 11/14]
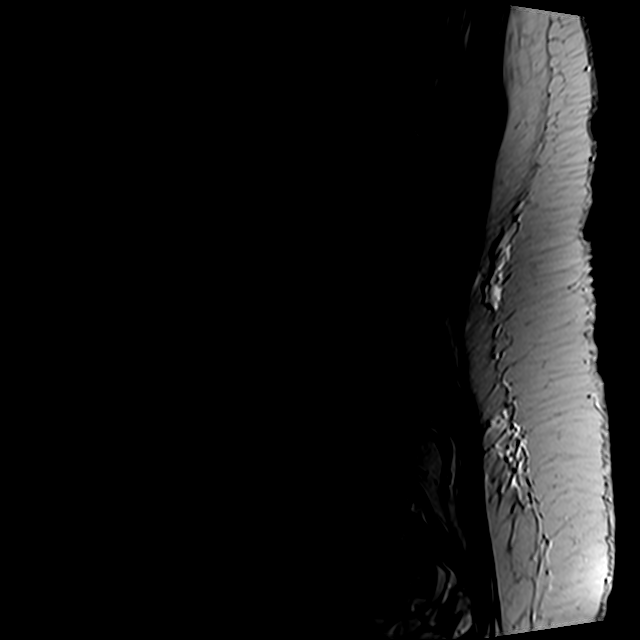
[im 14/14]
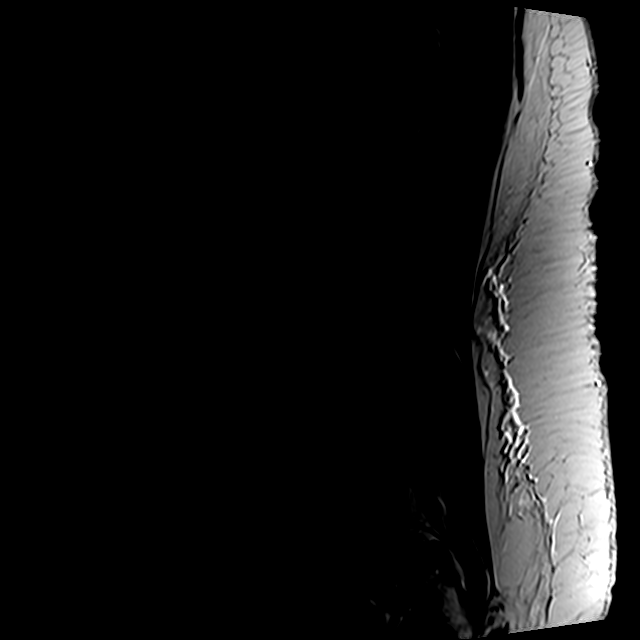

[Series 5: T2 · axial · 4.0mm · 0.70mm/px · z∈[-46,+142]mm · 9 of 34 slices shown (2 of 2)]
[im 1/34]
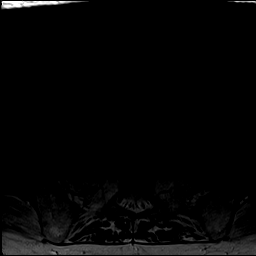
[im 5/34]
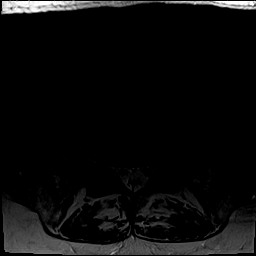
[im 10/34]
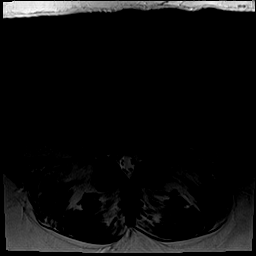
[im 15/34]
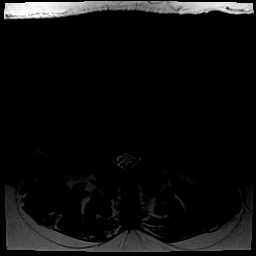
[im 17/34]
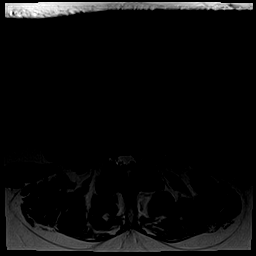
[im 19/34]
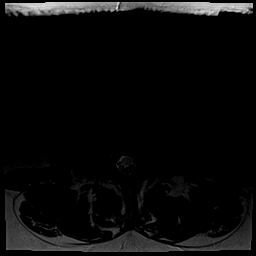
[im 24/34]
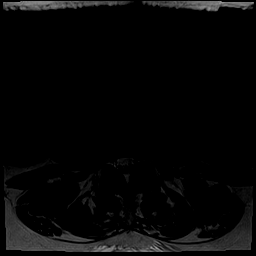
[im 29/34]
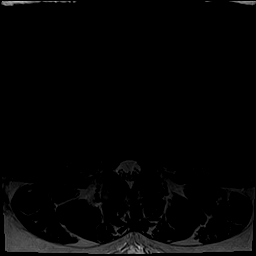
[im 34/34]
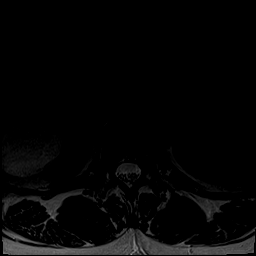

[Series 8: T1 · sagittal · 4.0mm · 0.84mm/px · 6 of 14 slices shown (1 of 2)]
[im 1/14]
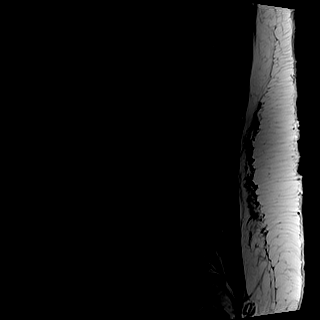
[im 3/14]
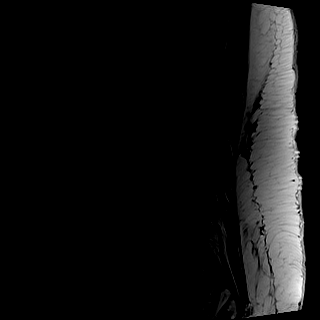
[im 6/14]
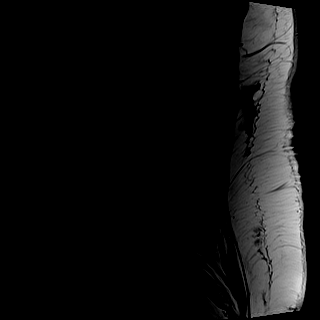
[im 8/14]
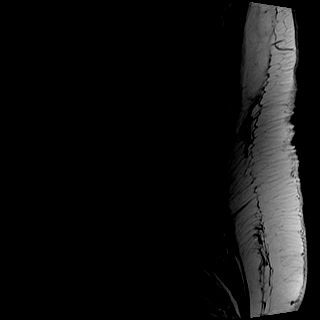
[im 11/14]
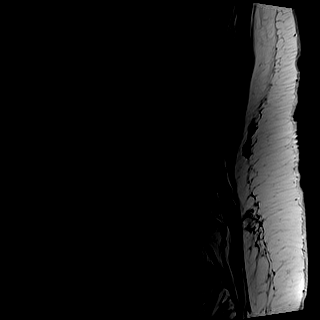
[im 14/14]
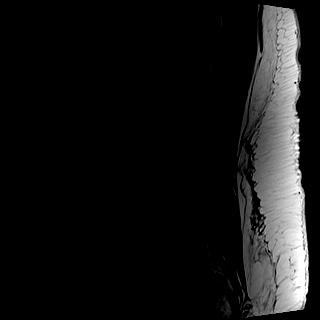

[Series 9: T1 · axial · 4.0mm · 0.35mm/px · z∈[-46,+116]mm · 5 of 34 slices shown (2 of 2)]
[im 1/34]
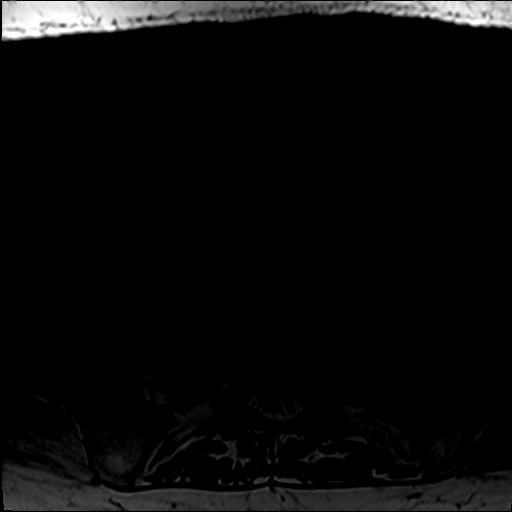
[im 5/34]
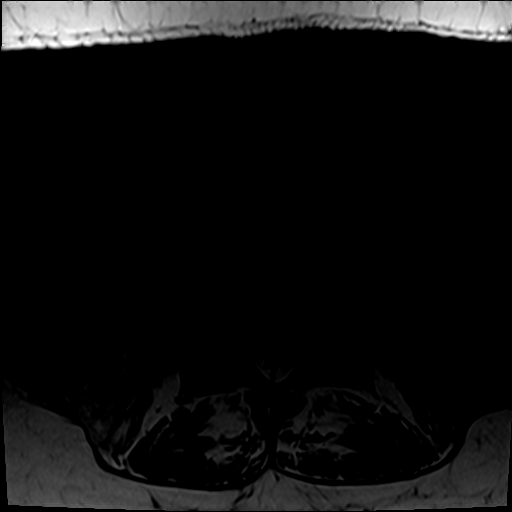
[im 10/34]
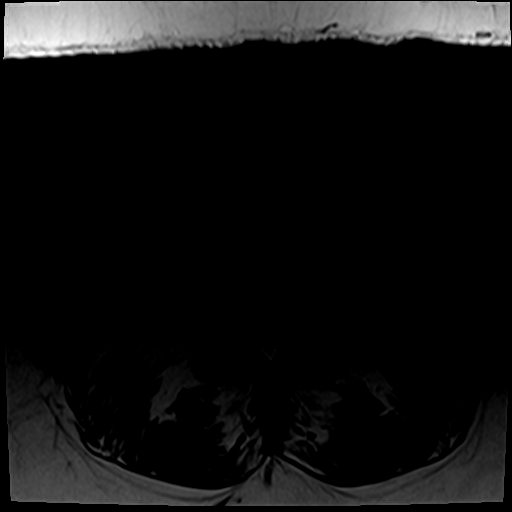
[im 17/34]
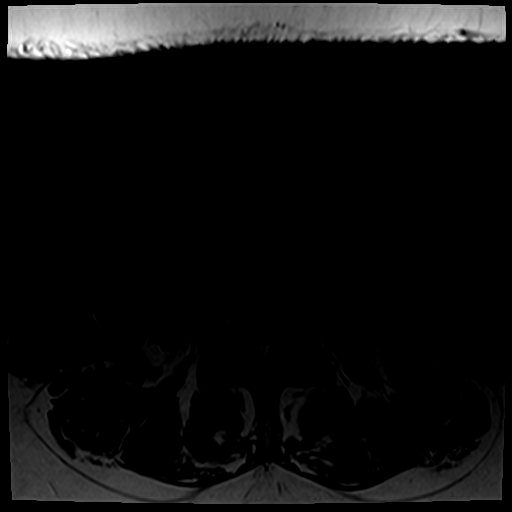
[im 29/34]
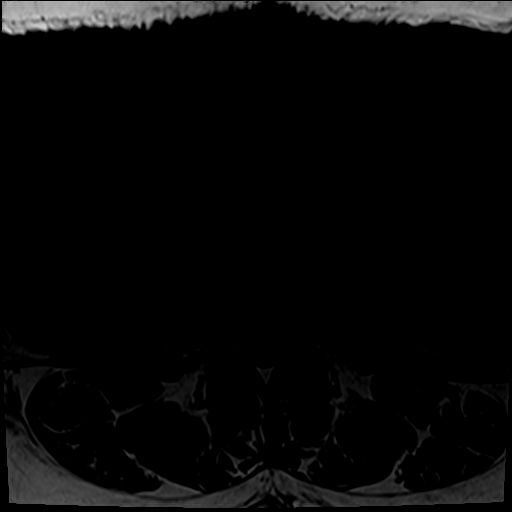

[26 of 48 positions shown; findings below may reference images not displayed]

FINDINGS: Segmentation:  Normal segmentation.  Lowest disc space L5-S1

Alignment:  Normal

Vertebrae:  Negative for fracture or mass.  Normal bone marrow.

Conus medullaris: Extends to the mid L1 level and appears normal.

Paraspinal and other soft tissues: Paraspinous muscles normal. No
retroperitoneal mass. 5 cm right renal cyst, incompletely evaluated
on the study.

Disc levels:

L1-2:  Negative

L2-3: Mild disc and facet degeneration. Mild disc bulging. No
significant spinal stenosis.

L3-4: Mild disc bulging and mild facet degeneration. No significant
spinal stenosis.

L4-5: Mild facet degeneration. No significant spinal or foraminal
stenosis.

L5-S1: Mild disc space narrowing and mild disc bulging. Mild facet
degeneration. No significant spinal or foraminal stenosis.
IMPRESSION: Mild lumbar degenerative change. No focal disc protrusion or spinal
stenosis.

## 2017-12-17 ENCOUNTER — Encounter (INDEPENDENT_AMBULATORY_CARE_PROVIDER_SITE_OTHER): Payer: Self-pay

## 2017-12-22 ENCOUNTER — Encounter (INDEPENDENT_AMBULATORY_CARE_PROVIDER_SITE_OTHER): Payer: Self-pay

## 2017-12-22 ENCOUNTER — Ambulatory Visit (INDEPENDENT_AMBULATORY_CARE_PROVIDER_SITE_OTHER): Payer: Self-pay | Admitting: Family Medicine

## 2018-01-07 DIAGNOSIS — Z0289 Encounter for other administrative examinations: Secondary | ICD-10-CM

## 2018-01-13 ENCOUNTER — Encounter (INDEPENDENT_AMBULATORY_CARE_PROVIDER_SITE_OTHER): Payer: Self-pay | Admitting: Bariatrics

## 2018-01-13 ENCOUNTER — Ambulatory Visit (INDEPENDENT_AMBULATORY_CARE_PROVIDER_SITE_OTHER): Payer: BLUE CROSS/BLUE SHIELD | Admitting: Bariatrics

## 2018-01-13 VITALS — BP 143/78 | HR 55 | Temp 97.6°F | Ht 65.0 in | Wt 269.0 lb

## 2018-01-13 DIAGNOSIS — I25118 Atherosclerotic heart disease of native coronary artery with other forms of angina pectoris: Secondary | ICD-10-CM

## 2018-01-13 DIAGNOSIS — R5383 Other fatigue: Secondary | ICD-10-CM | POA: Diagnosis not present

## 2018-01-13 DIAGNOSIS — I10 Essential (primary) hypertension: Secondary | ICD-10-CM

## 2018-01-13 DIAGNOSIS — E559 Vitamin D deficiency, unspecified: Secondary | ICD-10-CM

## 2018-01-13 DIAGNOSIS — R0602 Shortness of breath: Secondary | ICD-10-CM

## 2018-01-13 DIAGNOSIS — Z6841 Body Mass Index (BMI) 40.0 and over, adult: Secondary | ICD-10-CM

## 2018-01-13 DIAGNOSIS — E7849 Other hyperlipidemia: Secondary | ICD-10-CM | POA: Diagnosis not present

## 2018-01-13 DIAGNOSIS — R7303 Prediabetes: Secondary | ICD-10-CM | POA: Insufficient documentation

## 2018-01-13 DIAGNOSIS — Z1331 Encounter for screening for depression: Secondary | ICD-10-CM | POA: Diagnosis not present

## 2018-01-13 DIAGNOSIS — Z9189 Other specified personal risk factors, not elsewhere classified: Secondary | ICD-10-CM | POA: Diagnosis not present

## 2018-01-13 NOTE — Progress Notes (Signed)
.  Office: [phone]  /  Fax: [phone]   Dear Dr. Sherene Sires,   Thank you for referring Caitlyn [name] to our clinic. The following note includes my evaluation and treatment recommendations.  HPI:   Chief Complaint: OBESITY    Caitlyn [name] has been referred by Sandrea Hughs, MD for consultation regarding her obesity and obesity related comorbidities.    Caitlyn [name] (MR# [id number]) is a 67 y.o. female who presents on 01/13/2018 for obesity evaluation and treatment. Current BMI is Body mass index is 44.76 kg/m.Marland Kitchen Caitlyn [name] has been struggling with her weight for many years and has been unsuccessful in either losing weight, maintaining weight loss, or reaching her healthy weight goal.     Caitlyn [name] attended our information session and states she is currently in the action stage of change and ready to dedicate time achieving and maintaining a healthier weight. Caitlyn [name] is interested in becoming our patient and working on intensive lifestyle modifications including (but not limited to) diet, exercise and weight loss.    Caitlyn [name] states her family eats meals together she thinks her family will eat healthier with  her her desired weight loss is 92 lbs she has been heavy most of  her life her heaviest weight ever was 272 lbs. she has significant food cravings for seafood she has food cravings more in the evening she gets hungry in the middle of the night to eat she skips breakfast occasionally she frequently makes poor food choices she struggles with emotional eating    Fatigue Caitlyn [name] feels her energy is lower than it should be. This has worsened with weight gain and has not worsened recently. Caitlyn [name] admits to daytime somnolence and she admits to waking up still tired. Patient has a history of obstructive sleep apnea and she does not wear CPAP. Patent has a history of symptoms of daytime fatigue, morning fatigue and hypertension. Patient generally gets 5 hours of sleep per night, and  states they generally have re. Snoring is present. Apneic episodes are present. Epworth Sleepiness Score is 4  Dyspnea on exertion Caitlyn [name] notes increasing shortness of breath with exercising and seems to be worsening over time with weight gain. She notes getting out of breath sooner with activity than she used to. This has not gotten worse recently. Caitlyn [name] denies orthopnea.  Vitamin D deficiency Caitlyn [name] has a diagnosis of vitamin D deficiency. She is currently taking OTC vit D and denies nausea, vomiting or muscle weakness.  Hypertension Caitlyn [name] is a 68 y.o. female with hypertension. She is currently taking Carvedilol and Lasix. Her systolic blood pressure is elevated today at 143. Caitlyn [name] denies lightheadedness. She is working weight loss to help control her blood pressure with the goal of decreasing her risk of heart attack and stroke. Caitlyn [name] blood pressure is reasonably well controlled.  Hyperlipidemia Caitlyn [name] has hyperlipidemia and she is currently taking Atorvastatin. She is attempting to improve her cholesterol levels with intensive lifestyle modification including a low saturated fat diet, exercise and weight loss. She admits muscle aches and muscle weakness and denies nausea or vomiting.  Coronary Artery Disease Darcie has a history of PVD and acute coronary syndrome. Caitlyn [name] had cardiac stent placement in 2010 and 2016. She currently takes Plavix and ASA. Caitlyn [name] sees her cardiologist every six months.   Pre-Diabetes Caitlyn [name] has a diagnosis of prediabetes and there is no recent Hgb A1c result. She was informed this puts her at greater risk of developing diabetes. She is not taking  metformin currently and continues to work on diet and exercise to decrease risk of diabetes. She denies polyphagia, polydipsia or polyuria.  At risk for diabetes Caitlyn [name] is at higher than average risk for developing diabetes due to her obesity and prediabetes. She currently denies  polyuria or polydipsia.  Depression Screen Caitlyn [name]'s Food and Mood (modified PHQ-9) score was  Depression screen PHQ 2/9 01/13/2018  Decreased Interest 2  Down, Depressed, Hopeless 2  PHQ - 2 Score 4  Altered sleeping 2  Tired, decreased energy 2  Change in appetite 1  Feeling bad or failure about yourself  2  Trouble concentrating 1  Moving slowly or fidgety/restless 0  Suicidal thoughts 0  PHQ-9 Score 12  Difficult doing work/chores Not difficult at all    ALLERGIES: Allergies  Allergen Reactions  . Amlodipine Swelling  . Statins Other (See Comments)    myalgia    MEDICATIONS: Current Outpatient Medications on File Prior to Visit  Medication Sig Dispense Refill  . acetaminophen (TYLENOL) 500 MG tablet Take 1,000 mg by mouth every 6 (six) hours as needed for moderate pain.    Marland Kitchen albuterol (PROVENTIL HFA;VENTOLIN HFA) 108 (90 BASE) MCG/ACT inhaler Inhale 2 puffs into the lungs every 6 (six) hours as needed for wheezing or shortness of breath.     . allopurinol (ZYLOPRIM) 100 MG tablet Take 1 tablet (100 mg total) by mouth daily. (Patient taking differently: Take 300 mg by mouth daily. ) 90 tablet 0  . aspirin 81 MG tablet Take 81 mg by mouth daily. Reported on 04/04/2015    . atorvastatin (LIPITOR) 20 MG tablet Take 20 mg by mouth daily.     . Azilsartan-Chlorthalidone (EDARBYCLOR) 40-12.5 MG TABS Take by mouth.    . carvedilol (COREG) 3.125 MG tablet Take 3.125 mg by mouth 2 (two) times daily with a meal.    . clonazePAM (KLONOPIN) 1 MG tablet Take 1 mg by mouth at bedtime.     . colchicine (COLCRYS) 0.6 MG tablet Take 0.6 mg by mouth daily as needed (for flare ups). Flare ups    . furosemide (LASIX) 40 MG tablet Take 40 mg by mouth.    . levothyroxine (SYNTHROID, LEVOTHROID) 125 MCG tablet Take 125 mcg by mouth daily before breakfast.    . methocarbamol (ROBAXIN) 500 MG tablet Take 500 mg by mouth every 8 (eight) hours as needed for muscle spasms.     . metoprolol succinate  (TOPROL-XL) 25 MG 24 hr tablet Take 50 mg by mouth daily.     . Pediatric Multivit-Minerals-C (GUMMI BEAR MULTIVITAMIN/MIN) CHEW Chew 2 tablets by mouth daily.    . Vitamin D, Ergocalciferol, (DRISDOL) 50000 units CAPS capsule Take 50,000 Units by mouth every Monday.     No current facility-administered medications on file prior to visit.     PAST MEDICAL HISTORY: Past Medical History:  Diagnosis Date  . Anxiety   . Arthritis    "legs" (01/07/2016)  . Back pain   . CAD 06/2009   a.  s/p MI 4/11: tx with DES x 2 to RCA;  b.  LHC 03/17/11: LAD 30-40%, distal LAD 30-40%, OM2 40-50%, RCA stent patent, EF greater than 70%.;  c.  Lexiscan Myoview (12/15):  Mild apical thinning, no ischemia, EF 61%; normal study  . CAROTID STENOSIS    carotid Dopplers 03/25/11: RICA 60-79%; LICA 0-39%.  Follow up recommended in 6 months.  . Constipation   . Depression   . Gout    "on RX  for it" (01/07/2016)  . Heart disease   . HYPERLIPIDEMIA   . HYPERTENSION   . HYPOTHYROIDISM    post ablation tx Graves  . Hypothyroidism   . Joint pain   . Myocardial infarction [facility]) 2012   "mini"  . OSA on CPAP   . Osteoarthritis   . Pre-diabetes   . PVD   . Sleep apnea   . TOBACCO ABUSE quit 06/2009  . Varicose veins   . VITAMIN D DEFICIENCY    DEXA 04/2009 normal    PAST SURGICAL HISTORY: Past Surgical History:  Procedure Laterality Date  . CARDIAC CATHETERIZATION N/A 01/07/2016   Procedure: Left Heart Cath and Coronary Angiography;  Surgeon: Rinaldo Cloud, MD;  Location: [facility] INVASIVE CV LAB;  Service: Cardiovascular;  Laterality: N/A;  . CARDIAC CATHETERIZATION N/A 01/07/2016   Procedure: Coronary Stent Intervention;  Surgeon: Rinaldo Cloud, MD;  Location: [facility] INVASIVE CV LAB;  Service: Cardiovascular;  Laterality: N/A;  . CAROTID STENT Left    bilateral carotid artery disease post stent of left carotid  . CESAREAN SECTION  1985   "twins"  . CORONARY ANGIOPLASTY    . LEFT HEART CATHETERIZATION WITH  CORONARY ANGIOGRAM N/A 03/17/2011   Procedure: LEFT HEART CATHETERIZATION WITH CORONARY ANGIOGRAM;  Surgeon: Herby Abraham, MD;  Location: [facility] CATH LAB;  Service: Cardiovascular;  Laterality: N/A;  . PERIPHERAL VASCULAR CATHETERIZATION N/A 04/06/2015   Procedure: Abdominal Aortogram;  Surgeon: Sherren Kerns, MD;  Location: [facility] INVASIVE CV LAB;  Service: Cardiovascular;  Laterality: N/A;  . RADIOACTIVE PLAQUE INSERTION     Graves disease post radioactive treatment complicated hypothyroidism    SOCIAL HISTORY: Social History   Tobacco Use  . Smoking status: Former Smoker    Packs/day: 1.00    Years: 35.00    Pack years: 35.00    Last attempt to quit: 06/30/2009    Years since quitting: 8.5  . Smokeless tobacco: Never Used  Substance Use Topics  . Alcohol use: Yes    Alcohol/week: 2.0 standard drinks    Types: 2 Glasses of wine per week    Comment: occ  . Drug use: No    FAMILY HISTORY: Family History  Problem Relation Age of Onset  . Heart attack Mother   . Stroke Mother   . Cancer Mother   . Varicose Veins Mother   . Thyroid disease Mother   . Cancer Father   . Heart disease Father   . Coronary artery disease Other   . Hypertension Other   . Hypertension Sister     ROS: Review of Systems  Constitutional: Positive for malaise/fatigue.  HENT:       + Dentures  Eyes:       + Wear Glasses or Contacts  Respiratory: Positive for shortness of breath (with activity).   Cardiovascular: Negative for orthopnea.       + Chest Pain/Discomfort + Calf/Leg Pain with Walking  Gastrointestinal: Positive for constipation. Negative for nausea and vomiting.  Genitourinary: Negative for frequency.  Musculoskeletal: Positive for back pain and myalgias.       Negative for muscle weakness  Skin:       + Dryness  Endo/Heme/Allergies: Negative for polydipsia.       Negative for polyphagia Negative for lightheadedness   Psychiatric/Behavioral: The patient has insomnia.        +  Stress    PHYSICAL EXAM: Blood pressure (!) 143/78, pulse (!) 55, temperature 97.6 F (36.4 C), temperature source Oral, height 5'  5" (1.651 m), weight 269 lb (122 kg), SpO2 98 %. Body mass index is 44.76 kg/m. Physical Exam  Constitutional: She is oriented to person, place, and time. She appears well-developed and well-nourished.  HENT:  Head: Normocephalic and atraumatic.  Nose: Nose normal.  Mallanpati = 4  Eyes: EOM are normal. No scleral icterus.  Neck: Normal range of motion. Neck supple. No thyromegaly present.  Cardiovascular: Normal rate and regular rhythm.  Pulmonary/Chest: Effort normal. No respiratory distress.  Abdominal: Soft. There is no tenderness.  + Obesity  Musculoskeletal: Normal range of motion. She exhibits edema (trace edmema bilateral lower extremities).  Range of Motion normal in all 4 extremities  Neurological: She is alert and oriented to person, place, and time. Coordination normal.  Skin: Skin is warm and dry.  Psychiatric: She has a normal mood and affect.  Vitals reviewed.   RECENT LABS AND TESTS: BMET    Component Value Date/Time   NA 141 06/29/2017 0840   K 4.2 06/29/2017 0840   CL 105 06/29/2017 0840   CO2 26 06/29/2017 0840   GLUCOSE 99 06/29/2017 0840   BUN 20 06/29/2017 0840   CREATININE 0.96 06/29/2017 0840   CALCIUM 9.3 06/29/2017 0840   GFRNONAA >60 06/29/2017 0840   GFRAA >60 06/29/2017 0840   No results found for: HGBA1C No results found for: INSULIN CBC    Component Value Date/Time   WBC 10.0 01/08/2016 0333   RBC 4.38 01/08/2016 0333   HGB 13.1 01/08/2016 0333   HCT 39.9 01/08/2016 0333   PLT 217 01/08/2016 0333   MCV 91.1 01/08/2016 0333   MCH 29.9 01/08/2016 0333   MCHC 32.8 01/08/2016 0333   RDW 14.2 01/08/2016 0333   LYMPHSABS 2.6 01/06/2016 1619   MONOABS 0.4 01/06/2016 1619   EOSABS 0.2 01/06/2016 1619   BASOSABS 0.0 01/06/2016 1619   Iron/TIBC/Ferritin/ %Sat No results found for: IRON, TIBC, FERRITIN,  IRONPCTSAT Lipid Panel     Component Value Date/Time   CHOL 188 06/29/2017 0840   TRIG 207 (H) 06/29/2017 0840   TRIG 114 02/04/2010   HDL 43 06/29/2017 0840   CHOLHDL 4.4 06/29/2017 0840   VLDL 41 (H) 06/29/2017 0840   LDLCALC 104 (H) 06/29/2017 0840   LDLDIRECT 156.4 08/02/2012 1713   Hepatic Function Panel     Component Value Date/Time   PROT 7.5 06/29/2017 0840   ALBUMIN 3.7 06/29/2017 0840   AST 20 06/29/2017 0840   ALT 19 06/29/2017 0840   ALKPHOS 83 06/29/2017 0840   BILITOT 0.7 06/29/2017 0840   BILIDIR <0.1 (L) 06/29/2017 0840   IBILI NOT CALCULATED 06/29/2017 0840      Component Value Date/Time   TSH 2.636 05/25/2015 0930   TSH 2.23 02/24/2014 1553   TSH 2.52 10/19/2013 1641   TSH 1.31 03/16/2013 1310   TSH 1.31 03/16/2013 1310    ECG  shows NSR with a rate of 64 BPM INDIRECT CALORIMETER done today shows a VO2 of 216 and a REE of 1505.  Her calculated basal metabolic rate is 0981 thus her basal metabolic rate is worse than expected.    ASSESSMENT AND PLAN: Other fatigue - Plan: EKG 12-Lead, T3, T4, free, TSH  Shortness of breath on exertion  Essential hypertension  Other hyperlipidemia  Vitamin D deficiency - Plan: VITAMIN D 25 Hydroxy (Vit-D Deficiency, Fractures)  Prediabetes - Plan: Comprehensive metabolic panel, Hemoglobin A1c, Insulin, random  Coronary artery disease of native artery of native heart with stable angina pectoris (  HCC)  At risk for diabetes mellitus  Depression screening  Class 3 severe obesity with serious comorbidity and body mass index (BMI) of 40.0 to 44.9 in adult, unspecified obesity type [facility])  PLAN: Fatigue Miyoko was informed that her fatigue may be related to obesity, depression or many other causes. Labs will be ordered, and in the meanwhile Vivian has agreed to work on diet, exercise and weight loss to help with fatigue. Proper sleep hygiene was discussed including the need for 7-8 hours of quality sleep each  night. A sleep study was not ordered based on symptoms and Epworth score.  Dyspnea on exertion Ranay's shortness of breath appears to be obesity related and exercise induced. She has agreed to work on weight loss and gradually increase walking to treat her exercise induced shortness of breath. If Venise follows our instructions and loses weight without improvement of her shortness of breath, we will plan to refer to pulmonology. We will monitor this condition regularly. Brantlee agrees to this plan.  Vitamin D Deficiency Aadhira was informed that low vitamin D levels contributes to fatigue and are associated with obesity, breast, and colon cancer. She agrees to continue to take OTC vitamin D and will follow up for routine testing of vitamin D, at least 2-3 times per year. She was informed of the risk of over-replacement of vitamin D and agrees to not increase her dose unless she discusses this with [place] first. We will check vitamin D level today.  Hypertension We discussed sodium restriction, working on healthy weight loss, and a regular exercise program as the means to achieve improved blood pressure control. Prabhleen agreed with this plan and agreed to follow up as directed. We will continue to monitor her blood pressure as well as her progress with the above lifestyle modifications. She will continue her medications as prescribed and will watch for signs of hypotension as she continues her lifestyle modifications.  Hyperlipidemia Cristalle was informed of the American Heart Association Guidelines emphasizing intensive lifestyle modifications as the first line treatment for hyperlipidemia. We discussed many lifestyle modifications today in depth, and Chicquita will start to work on decreasing saturated fats such as fatty red meat, butter and many fried foods. She will also increase vegetables and lean protein in her diet and start to work on exercise and weight loss efforts. Sydnee agrees to start OTC  CoQ10 200 mg daily and follow up as directed.  Coronary Artery Disease Maritza will continue to see her cardiologist every six months and will continue all medications per her cardiologist. [name] will follow up with our clinic in 2 weeks.  Pre-Diabetes Catharine will continue to work on weight loss, exercise, and decreasing simple carbohydrates in her diet to help decrease the risk of diabetes. She was informed that eating too many simple carbohydrates or too many calories at one sitting increases the likelihood of GI side effects. We will check Hgb A1c and insulin level today. Carolle agreed to follow up with [place] as directed to monitor her progress.  Diabetes risk counseling Nioma was given extended (15 minutes) diabetes prevention counseling today. She is 67 y.o. female and has risk factors for diabetes including obesity and prediabetes. We discussed intensive lifestyle modifications today with an emphasis on weight loss as well as increasing exercise and decreasing simple carbohydrates in her diet.  Depression Screen Micala had a moderately positive depression screening. Depression is commonly associated with obesity and often results in emotional eating behaviors. We will monitor this closely and work  on CBT to help improve the non-hunger eating patterns. Referral to Psychology may be required if no improvement is seen as she continues in our clinic.  Obesity Karly is currently in the action stage of change and her goal is to continue with weight loss efforts. I recommend Merit begin the structured treatment plan as follows:  She has agreed to follow the Category 2 plan Anmarie has been instructed to eventually work up to a goal of 150 minutes of combined cardio and strengthening exercise per week for weight loss and overall health benefits. We discussed the following Behavioral Modification Strategies today: increase H2O intake, no skipping meals, increasing lean protein intake,  decreasing simple carbohydrates , increasing vegetables, decrease eating out and work on meal planning and easy cooking plans  We will refer to Dr. Liana Gerold [place] primary care.   She was informed of the importance of frequent follow up visits to maximize her success with intensive lifestyle modifications for her multiple health conditions. She was informed we would discuss her lab results at her next visit unless there is a critical issue that needs to be addressed sooner. Makenzie agreed to keep her next visit at the agreed upon time to discuss these results.    OBESITY BEHAVIORAL INTERVENTION VISIT  Today's visit was # 1   Starting weight: 269 lbs Starting date: 01/13/18 Today's weight : 269 lbs  Today's date: 01/13/2018 Total lbs lost to date: 0 At least 15 minutes were spent on discussing the following behavioral intervention visit.   ASK: We discussed the diagnosis of obesity with Lysle Morales today and Brinna agreed to give [place] permission to discuss obesity behavioral modification therapy today.  ASSESS: Taygen has the diagnosis of obesity and her BMI today is 44.76 Vila is in the action stage of change   ADVISE: Amarilis was educated on the multiple health risks of obesity as well as the benefit of weight loss to improve her health. She was advised of the need for long term treatment and the importance of lifestyle modifications to improve her current health and to decrease her risk of future health problems.  AGREE: Multiple dietary modification options and treatment options were discussed and  Elener agreed to follow the recommendations documented in the above note.  ARRANGE: Elina was educated on the importance of frequent visits to treat obesity as outlined per CMS and USPSTF guidelines and agreed to schedule her next follow up appointment today.  Cristi Loron, am acting as Energy manager for [employer]. Manson Passey, DO  I have reviewed the above  documentation for accuracy and completeness, and I agree with the above. -Corinna Capra, DO

## 2018-01-14 LAB — TSH: TSH: 3 u[IU]/mL (ref 0.450–4.500)

## 2018-01-14 LAB — COMPREHENSIVE METABOLIC PANEL
ALT: 16 IU/L (ref 0–32)
AST: 23 IU/L (ref 0–40)
Albumin/Globulin Ratio: 1.4 (ref 1.2–2.2)
Albumin: 4.6 g/dL (ref 3.6–4.8)
Alkaline Phosphatase: 106 IU/L (ref 39–117)
BUN/Creatinine Ratio: 34 — ABNORMAL HIGH (ref 12–28)
BUN: 33 mg/dL — AB (ref 8–27)
Bilirubin Total: 0.2 mg/dL (ref 0.0–1.2)
CALCIUM: 9.8 mg/dL (ref 8.7–10.3)
CO2: 23 mmol/L (ref 20–29)
Chloride: 100 mmol/L (ref 96–106)
Creatinine, Ser: 0.98 mg/dL (ref 0.57–1.00)
GFR, EST AFRICAN AMERICAN: 71 mL/min/{1.73_m2} (ref >59)
GFR, EST NON AFRICAN AMERICAN: 61 mL/min/{1.73_m2} (ref >59)
GLUCOSE: 94 mg/dL (ref 65–99)
Globulin, Total: 3.2 g/dL (ref 1.5–4.5)
Potassium: 4.7 mmol/L (ref 3.5–5.2)
Sodium: 138 mmol/L (ref 134–144)
TOTAL PROTEIN: 7.8 g/dL (ref 6.0–8.5)

## 2018-01-14 LAB — HEMOGLOBIN A1C
ESTIMATED AVERAGE GLUCOSE: 117 mg/dL
Hgb A1c MFr Bld: 5.7 % — ABNORMAL HIGH (ref 4.8–5.6)

## 2018-01-14 LAB — VITAMIN D 25 HYDROXY (VIT D DEFICIENCY, FRACTURES): VIT D 25 HYDROXY: 29.4 ng/mL — AB (ref 30.0–100.0)

## 2018-01-14 LAB — INSULIN, RANDOM: INSULIN: 27.7 u[IU]/mL — AB (ref 2.6–24.9)

## 2018-01-14 LAB — T4, FREE: FREE T4: 1.65 ng/dL (ref 0.82–1.77)

## 2018-01-14 LAB — T3: T3, Total: 107 ng/dL (ref 71–180)

## 2018-01-18 ENCOUNTER — Other Ambulatory Visit: Payer: Self-pay

## 2018-01-18 DIAGNOSIS — I779 Disorder of arteries and arterioles, unspecified: Secondary | ICD-10-CM

## 2018-01-18 DIAGNOSIS — I739 Peripheral vascular disease, unspecified: Secondary | ICD-10-CM

## 2018-01-26 ENCOUNTER — Ambulatory Visit (INDEPENDENT_AMBULATORY_CARE_PROVIDER_SITE_OTHER): Payer: Medicare HMO | Admitting: Family

## 2018-01-26 ENCOUNTER — Ambulatory Visit (HOSPITAL_COMMUNITY)
Admission: RE | Admit: 2018-01-26 | Discharge: 2018-01-26 | Disposition: A | Discharge status: Home / Self Care | Payer: Medicare HMO | Source: Ambulatory Visit | Attending: Family | Admitting: Family

## 2018-01-26 ENCOUNTER — Other Ambulatory Visit: Payer: Self-pay

## 2018-01-26 ENCOUNTER — Encounter: Payer: Self-pay | Admitting: Family

## 2018-01-26 ENCOUNTER — Ambulatory Visit (INDEPENDENT_AMBULATORY_CARE_PROVIDER_SITE_OTHER)
Admission: RE | Admit: 2018-01-26 | Discharge: 2018-01-26 | Disposition: A | Discharge status: Home / Self Care | Payer: Medicare HMO | Source: Ambulatory Visit | Attending: Family | Admitting: Family

## 2018-01-26 VITALS — BP 147/84 | HR 71 | Temp 97.9°F | Resp 16 | Ht 65.0 in | Wt 270.0 lb

## 2018-01-26 DIAGNOSIS — M5432 Sciatica, left side: Secondary | ICD-10-CM

## 2018-01-26 DIAGNOSIS — I739 Peripheral vascular disease, unspecified: Secondary | ICD-10-CM

## 2018-01-26 DIAGNOSIS — I779 Disorder of arteries and arterioles, unspecified: Secondary | ICD-10-CM | POA: Insufficient documentation

## 2018-01-26 DIAGNOSIS — M5431 Sciatica, right side: Secondary | ICD-10-CM

## 2018-01-26 DIAGNOSIS — Z87891 Personal history of nicotine dependence: Secondary | ICD-10-CM

## 2018-01-26 NOTE — Progress Notes (Signed)
[facility] [place]   CC: Follow up peripheral artery occlusive disease  History of Present Illness Caitlyn [name] is a 67 y.o. female who is s/p arteriogram on 04/06/15 for right leg pain by Dr. Darrick Penna. Arteriogram findings and recommendations were: Patient has a short segment right superficial femoral artery origin stenosis of about 70%. This may be contributed to some of her symptoms but probably does not explain all of her symptoms. I (Dr. Casilda Carls it is reasonable for medical management of this currently. As angioplasty of this could have short durability and if she develops restenosis she may overall have worse symptoms in the right leg. We will have her follow-up with [place] in 6 months time for repeat ABIs. In the meanwhile she will try to lose some weight control her hypertension and cholesterol. She will also need to be continued to monitor for development of diabetes due to her obesity. She also has has some evidence of tibial disease suggestive of intermittent hyperglycemia.  She returns today for follow up.  She has intermittent catching type pain in her left groinafter standing for a while, and also after standing for a while it feels like both legs are weak and will give way. She states her legs feel better when she walks more.  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 She denies non healing wounds in her feet/legs.   Her PMHX includes CAD with stenting in 2011 after an MI, Graves Disease with history of ablation, morbid obesity, and former smoker.  She is seeing Sr. Astronomer for [facility].   Diabetic: A1C was 5.7 on 01-13-18 Tobacco use: former smoker, quit in 2010  Pt meds include: Statin :Yes, states she takes every other day, makes her legs hurt Betablocker: Yes ASA: Yes Other anticoagulants/antiplatelets: Plavix    Past  Medical History:  Diagnosis Date  . Anxiety   . Arthritis    "legs" (01/07/2016)  . Back pain   . CAD 06/2009   a.  s/p MI 4/11: tx with DES x 2 to RCA;  b.  LHC 03/17/11: LAD 30-40%, distal LAD 30-40%, OM2 40-50%, RCA stent patent, EF greater than 70%.;  c.  Lexiscan Myoview (12/15):  Mild apical thinning, no ischemia, EF 61%; normal study  . CAROTID STENOSIS    carotid Dopplers 03/25/11: RICA 60-79%; LICA 0-39%.  Follow up recommended in 6 months.  . Constipation   . Depression   . Gout    "on RX for it" (01/07/2016)  . Heart disease   . HYPERLIPIDEMIA   . HYPERTENSION   . HYPOTHYROIDISM    post ablation tx Graves  . Hypothyroidism   . Joint pain   . Myocardial infarction [facility]) 2012   "mini"  . OSA on CPAP   . Osteoarthritis   . Pre-diabetes   . PVD   . Sleep apnea   . TOBACCO ABUSE quit 06/2009  . Varicose veins   . VITAMIN D DEFICIENCY    DEXA 04/2009 normal    Social History Social History   Tobacco Use  . Smoking status: Former Smoker    Packs/day: 1.00    Years: 35.00    Pack years: 35.00    Last attempt to quit: 06/30/2009    Years since quitting: 8.5  . Smokeless tobacco: Never Used  Substance Use Topics  . Alcohol use: Yes  Alcohol/week: 2.0 standard drinks    Types: 2 Glasses of wine per week    Comment: occ  . Drug use: No    Family History Family History  Problem Relation Age of Onset  . Heart attack Mother   . Stroke Mother   . Cancer Mother   . Varicose Veins Mother   . Thyroid disease Mother   . Cancer Father   . Heart disease Father   . Coronary artery disease Other   . Hypertension Other   . Hypertension Sister     Past Surgical History:  Procedure Laterality Date  . CARDIAC CATHETERIZATION N/A 01/07/2016   Procedure: Left Heart Cath and Coronary Angiography;  Surgeon: Rinaldo CloudMohan Harwani, MD;  [facility];  Service: Cardiovascular;  Laterality: N/A;  . CARDIAC CATHETERIZATION N/A 01/07/2016   Procedure: Coronary Stent  Intervention;  Surgeon: Rinaldo CloudMohan Harwani, MD;  Location: [facility] INVASIVE CV LAB;  Service: Cardiovascular;  Laterality: N/A;  . CAROTID STENT Left    bilateral carotid artery disease post stent of left carotid  . CESAREAN SECTION  1985   "twins"  . CORONARY ANGIOPLASTY    . LEFT HEART CATHETERIZATION WITH CORONARY ANGIOGRAM N/A 03/17/2011   Procedure: LEFT HEART CATHETERIZATION WITH CORONARY ANGIOGRAM;  Surgeon: Herby Abrahamhomas D Stuckey, MD;  [facility];  Service: Cardiovascular;  Laterality: N/A;  . PERIPHERAL VASCULAR CATHETERIZATION N/A 04/06/2015   Procedure: Abdominal Aortogram;  Surgeon: Sherren Kernsharles E Fields, MD;  [facility];  Service: Cardiovascular;  Laterality: N/A;  . RADIOACTIVE PLAQUE INSERTION     Graves disease post radioactive treatment complicated hypothyroidism    Allergies  Allergen Reactions  . Amlodipine Swelling  . Statins Other (See Comments)    myalgia    Current Outpatient Medications  Medication Sig Dispense Refill  . acetaminophen (TYLENOL) 500 MG tablet Take 1,000 mg by mouth every 6 (six) hours as needed for moderate pain.    Marland Kitchen. albuterol (PROVENTIL HFA;VENTOLIN HFA) 108 (90 BASE) MCG/ACT inhaler Inhale 2 puffs into the lungs every 6 (six) hours as needed for wheezing or shortness of breath.     . allopurinol (ZYLOPRIM) 100 MG tablet Take 1 tablet (100 mg total) by mouth daily. (Patient taking differently: Take 300 mg by mouth daily. ) 90 tablet 0  . aspirin 81 MG tablet Take 81 mg by mouth daily. Reported on 04/04/2015    . atorvastatin (LIPITOR) 20 MG tablet Take 20 mg by mouth daily.     . Azilsartan-Chlorthalidone (EDARBYCLOR) 40-12.5 MG TABS Take by mouth.    . carvedilol (COREG) 3.125 MG tablet Take 3.125 mg by mouth 2 (two) times daily with a meal.    . clonazePAM (KLONOPIN) 1 MG tablet Take 1 mg by mouth at bedtime.     . clopidogrel (PLAVIX) 75 MG tablet   2  . colchicine (COLCRYS) 0.6 MG tablet Take 0.6 mg by mouth daily as needed (for  flare ups). Flare ups    . EDARBI 40 MG TABS   2  . furosemide (LASIX) 40 MG tablet Take 40 mg by mouth.    . levothyroxine (SYNTHROID, LEVOTHROID) 125 MCG tablet Take 125 mcg by mouth daily before breakfast.    . methocarbamol (ROBAXIN) 500 MG tablet Take 500 mg by mouth every 8 (eight) hours as needed for muscle spasms.     . Pediatric Multivit-Minerals-C (GUMMI BEAR MULTIVITAMIN/MIN) CHEW Chew 2 tablets by mouth daily.    . Vitamin D, Ergocalciferol, (DRISDOL) 50000 units  CAPS capsule Take 50,000 Units by mouth every Monday.     No current facility-administered medications for this visit.     ROS: See HPI for pertinent positives and negatives.   Physical Examination  Vitals:   01/26/18 1348 01/26/18 1355  BP: 129/81 (!) 147/84  Pulse: 71   Resp: 16   Temp: 97.9 F (36.6 C)   TempSrc: Oral   SpO2: 98%   Weight: 270 lb (122.5 kg)   Height: 5\' 5"  (1.651 m)    Body mass index is 44.93 kg/m.  General: A&O x 3, WDWN, morbidly obese female. Gait: normal HENT: No gross abnormalities.  Eyes: PERRLA. Bilateral exophthalmos.  Pulmonary: Respirations are non labored, CTAB, good air movement in all fields Cardiac: regular rhythm, and rate, +murmur.       Carotid Bruits Right Left   Negative Negative   Radial pulses are 2+ palpable bilaterally   Adominal aortic pulse is not palpable                         VASCULAR EXAM: Extremities without ischemic changes, without Gangrene; without open wounds.                                                                                                          LE Pulses Right Left       FEMORAL  not palpable (morbid obesity)  not palpable        POPLITEAL  not palpable   not palpable       POSTERIOR TIBIAL  not palpable   not palpable        DORSALIS PEDIS      ANTERIOR TIBIAL not palpable  not palpable    Abdomen: softly obese, NT, no palpable masses. Skin: no rashes, no cellulitis, no ulcers noted. Musculoskeletal: no  muscle wasting or atrophy.  Neurologic: A&O X 3; appropriate affect, Sensation is normal; MOTOR FUNCTION:  moving all extremities equally, motor strength 5/5 throughout. Speech is fluent/normal. CN 2-12 intact. Pain elicited in low backand both hipswith resistance against straight leg raise in both legs.  Psychiatric: Thought content is normal, mood appropriate for clinical situation.    ASSESSMENT: Caitlyn [name] is a 67 y.o. female with a short segment right superficial femoral artery origin stenosis of about 70% found on arteriogram in January 2017. This may contributeto some of her symptoms but probably does not explain all of her symptoms.  She has known arthritis in her back, had therapy for this at one time which helped her symptoms. Pain elicited in low backand left hipwith straight leg raise test in left leg; she sees a spine specialist, Dr. Luiz Blare with [facility]; she states she will ask his office about some physical therapy to help her lumbar spine pain.    The pain in her left leg is not due to lack of arterial perfusion, since she has normal arterial perfusion to her left ankle, normal left ABI. She has no pain in her right leg.   She has  no signs of ischemia in her feet/legs.  Her atherosclerotic risk factors include CAD with stenting in 2011 after an MI, morbid obesity, and former smoker. Fortunately she does not have DM.   She is considering bariatric surgery. She is seeing Dr. Lady Gary, [facility].   I discussed with pt at a previous visit possible intervention and diagnostics in the form of aortogram with bilateral run off, possible intervention with stenting or angioplasty, but she prefers conserative measures: daily seated leg exercises, she is walking in a swimming pool and taking water aerobics.   DATA  Right LE Arterial Duplex (01-26-18): Confirmed stenosis at right proximal SFA, velocity of 406 cm/s. Biphasic waveforms at  right distal CFA and proximal SFA. Monophasic waveforms distal to this.    ABI (Date: 01/26/2018):  R:   ABI: 0.89 (was 0.54 on 10-15-17),   PT: mono  DP: mono  TBI:  0.64, toe pressure 74, (was 0.34)  L:   ABI: 1.13 (was 0.73),   PT: tri  DP: bi  TBI: 0.77, toe pressure 88, (was 0.58) Significant improvement in bilateral ABI and TBI. Right ABI indicates mild disease with monophasic waveforms. Left ABI indicates no disease, tri and biphasic waveforms.     PLAN:  Based on the patient's vascular studies and examination, pt will return to clinic in 6 months with ABI's. I advised her to notify us if she develops concerns re the circulation in her feet or leg.   I discussed in depth with the patient the nature of atherosclerosis, and emphasized the importance of maximal medical management including strict control of blood pressure, blood glucose, and lipid levels, obtaining regular exercise, and continued cessation of smoking.  The patient is aware that without maximal medical management the underlying atherosclerotic disease process will progress, limiting the benefit of any interventions.  The patient was given information about PAD including signs, symptoms, treatment, what symptoms should prompt the patient to seek immediate medical care, and risk reduction measures to take.  Charisse March, RN, MSN, FNP-C [facility] Phone: [phone]  Clinic MD: Chestine Spore  01/26/18 1:59 PM

## 2018-01-26 NOTE — Patient Instructions (Signed)

## 2018-01-27 ENCOUNTER — Ambulatory Visit (INDEPENDENT_AMBULATORY_CARE_PROVIDER_SITE_OTHER): Payer: Medicare HMO | Admitting: Bariatrics

## 2018-01-27 ENCOUNTER — Encounter (INDEPENDENT_AMBULATORY_CARE_PROVIDER_SITE_OTHER): Payer: Self-pay | Admitting: Bariatrics

## 2018-01-27 VITALS — BP 133/81 | HR 77 | Temp 97.6°F | Ht 65.0 in | Wt 267.0 lb

## 2018-01-27 DIAGNOSIS — R7303 Prediabetes: Secondary | ICD-10-CM | POA: Diagnosis not present

## 2018-01-27 DIAGNOSIS — E7849 Other hyperlipidemia: Secondary | ICD-10-CM | POA: Diagnosis not present

## 2018-01-27 DIAGNOSIS — Z9189 Other specified personal risk factors, not elsewhere classified: Secondary | ICD-10-CM

## 2018-01-27 DIAGNOSIS — Z6841 Body Mass Index (BMI) 40.0 and over, adult: Secondary | ICD-10-CM

## 2018-01-27 DIAGNOSIS — E559 Vitamin D deficiency, unspecified: Secondary | ICD-10-CM

## 2018-01-27 DIAGNOSIS — I10 Essential (primary) hypertension: Secondary | ICD-10-CM

## 2018-01-27 MED ORDER — METFORMIN HCL 500 MG PO TABS: 500.0000 mg | ORAL_TABLET | Freq: Every day | ORAL | 0 refills | Status: DC

## 2018-02-01 NOTE — Progress Notes (Signed)
Office: [phone]  /  Fax: [phone]   HPI:   Chief Complaint: OBESITY Caitlyn [name] is here to discuss her progress with her obesity treatment plan. She is on the  follow the Category 2 plan and is following her eating plan approximately 80 % of the time. She states she is exercising 0 minutes 0 times per week. Caitlyn [name] is currently struggling with the Category 2 plan. She has switched the individual portions. She has been eating too much fruit. She is hungry in the am. She denies cravings.  Her weight is 267 lb (121.1 kg) today and has had a weight loss of 2 pounds over a period of 2 weeks since her last visit. She has lost 2 lbs since starting treatment with [place].  Vitamin D deficiency Caitlyn [name] has a diagnosis of vitamin D deficiency. She is currently taking vit D for the last 6 months and denies nausea, vomiting or muscle weakness.  Ref. Range 01/13/2018 10:23  Vitamin D, 25-Hydroxy Latest Ref Range: 30.0 - 100.0 ng/mL 29.4 (L)   Pre-Diabetes Caitlyn [name] has a diagnosis of prediabetes based on her elevated HgA1c at 5.7 and insulin level of 27.7 and was informed this puts her at greater risk of developing diabetes. She is not taking metformin currently and continues to work on diet and exercise to decrease risk of diabetes. She denies nausea or hypoglycemia. She reports polyphagia.   Hyperlipidemia Caitlyn [name] has hyperlipidemia and has been trying to improve her cholesterol levels with intensive lifestyle modification including a low saturated fat diet, exercise and weight loss. She denies any chest pain, claudication or myalgias. She is currently taking Atorvastatin.   Hypertension [name] is a 67 y.o. female with hypertension.  [name] denies chest pain or shortness of breath on exertion. She is working weight loss to help control her blood pressure with the goal of decreasing her risk of heart attack and stroke. Caitlyn [name] blood pressure is currently controlled. She is currently  taking Edarbyclor 40-12.5 mg, Lasix, and Coreg.   At risk for osteopenia and osteoporosis Caitlyn [name] is at higher risk of osteopenia and osteoporosis due to vitamin D deficiency.   ALLERGIES: Allergies  Allergen Reactions  . Amlodipine Swelling  . Statins Other (See Comments)    myalgia    MEDICATIONS: Current Outpatient Medications on File Prior to Visit  Medication Sig Dispense Refill  . acetaminophen (TYLENOL) 500 MG tablet Take 1,000 mg by mouth every 6 (six) hours as needed for moderate pain.    Marland Kitchen albuterol (PROVENTIL HFA;VENTOLIN HFA) 108 (90 BASE) MCG/ACT inhaler Inhale 2 puffs into the lungs every 6 (six) hours as needed for wheezing or shortness of breath.     . allopurinol (ZYLOPRIM) 100 MG tablet Take 1 tablet (100 mg total) by mouth daily. (Patient taking differently: Take 300 mg by mouth daily. ) 90 tablet 0  . aspirin 81 MG tablet Take 81 mg by mouth daily. Reported on 04/04/2015    . atorvastatin (LIPITOR) 20 MG tablet Take 20 mg by mouth daily.     . Azilsartan-Chlorthalidone (EDARBYCLOR) 40-12.5 MG TABS Take by mouth.    . carvedilol (COREG) 3.125 MG tablet Take 3.125 mg by mouth 2 (two) times daily with a meal.    . clonazePAM (KLONOPIN) 1 MG tablet Take 1 mg by mouth at bedtime.     . clopidogrel (PLAVIX) 75 MG tablet   2  . colchicine (COLCRYS) 0.6 MG tablet Take 0.6 mg by mouth daily as needed (for flare  ups). Flare ups    . EDARBI 40 MG TABS   2  . furosemide (LASIX) 40 MG tablet Take 40 mg by mouth.    . levothyroxine (SYNTHROID, LEVOTHROID) 125 MCG tablet Take 125 mcg by mouth daily before breakfast.    . methocarbamol (ROBAXIN) 500 MG tablet Take 500 mg by mouth every 8 (eight) hours as needed for muscle spasms.     . Pediatric Multivit-Minerals-C (GUMMI BEAR MULTIVITAMIN/MIN) CHEW Chew 2 tablets by mouth daily.    . Vitamin D, Ergocalciferol, (DRISDOL) 50000 units CAPS capsule Take 50,000 Units by mouth every Monday.     No current facility-administered  medications on file prior to visit.     PAST MEDICAL HISTORY: Past Medical History:  Diagnosis Date  . Anxiety   . Arthritis    "legs" (01/07/2016)  . Back pain   . CAD 06/2009   a.  s/p MI 4/11: tx with DES x 2 to RCA;  b.  LHC 03/17/11: LAD 30-40%, distal LAD 30-40%, OM2 40-50%, RCA stent patent, EF greater than 70%.;  c.  Lexiscan Myoview (12/15):  Mild apical thinning, no ischemia, EF 61%; normal study  . CAROTID STENOSIS    carotid Dopplers 03/25/11: RICA 60-79%; LICA 0-39%.  Follow up recommended in 6 months.  . Constipation   . Depression   . Gout    "on RX for it" (01/07/2016)  . Heart disease   . HYPERLIPIDEMIA   . HYPERTENSION   . HYPOTHYROIDISM    post ablation tx Graves  . Hypothyroidism   . Joint pain   . Myocardial infarction [facility]) 2012   "mini"  . OSA on CPAP   . Osteoarthritis   . Pre-diabetes   . PVD   . Sleep apnea   . TOBACCO ABUSE quit 06/2009  . Varicose veins   . VITAMIN D DEFICIENCY    DEXA 04/2009 normal    PAST SURGICAL HISTORY: Past Surgical History:  Procedure Laterality Date  . CARDIAC CATHETERIZATION N/A 01/07/2016   Procedure: Left Heart Cath and Coronary Angiography;  Surgeon: Rinaldo Cloud, MD;  Location[facility] INVASIVE CV LAB;  Service: Cardiovascular;  Laterality: N/A;  . CARDIAC CATHETERIZATION N/A 01/07/2016   Procedure: Coronary Stent Intervention;  Surgeon: Rinaldo Cloud, MD;  Location: [facility] INVASIVE CV LAB;  Service: Cardiovascular;  Laterality: N/A;  . CAROTID STENT Left    bilateral carotid artery disease post stent of left carotid  . CESAREAN SECTION  1985   "twins"  . CORONARY ANGIOPLASTY    . LEFT HEART CATHETERIZATION WITH CORONARY ANGIOGRAM N/A 03/17/2011   Procedure: LEFT HEART CATHETERIZATION WITH CORONARY ANGIOGRAM;  Surgeon: Herby Abraham, MD;  Location: [facility] CATH LAB;  Service: Cardiovascular;  Laterality: N/A;  . PERIPHERAL VASCULAR CATHETERIZATION N/A 04/06/2015   Procedure: Abdominal Aortogram;  Surgeon: Sherren Kerns, MD;  Location: [facility] INVASIVE CV LAB;  Service: Cardiovascular;  Laterality: N/A;  . RADIOACTIVE PLAQUE INSERTION     Graves disease post radioactive treatment complicated hypothyroidism    SOCIAL HISTORY: Social History   Tobacco Use  . Smoking status: Former Smoker    Packs/day: 1.00    Years: 35.00    Pack years: 35.00    Last attempt to quit: 06/30/2009    Years since quitting: 8.5  . Smokeless tobacco: Never Used  Substance Use Topics  . Alcohol use: Yes    Alcohol/week: 2.0 standard drinks    Types: 2 Glasses of wine per week    Comment: occ  .  Drug use: No    FAMILY HISTORY: Family History  Problem Relation Age of Onset  . Heart attack Mother   . Stroke Mother   . Cancer Mother   . Varicose Veins Mother   . Thyroid disease Mother   . Cancer Father   . Heart disease Father   . Coronary artery disease Other   . Hypertension Other   . Hypertension Sister     ROS: Review of Systems  Constitutional: Positive for weight loss.  Respiratory: Negative for shortness of breath.   Cardiovascular: Negative for chest pain and claudication.  Gastrointestinal: Negative for nausea and vomiting.  Musculoskeletal: Negative for myalgias.       Negative for muscle weakness  Endo/Heme/Allergies:       Negative for polyphagia  Negative for hypoglycemia    PHYSICAL EXAM: Blood pressure 133/81, pulse 77, temperature 97.6 F (36.4 C), temperature source Oral, height 5\' 5"  (1.651 m), weight 267 lb (121.1 kg), SpO2 98 %. Body mass index is 44.43 kg/m. Physical Exam  Constitutional: She is oriented to person, place, and time. She appears well-developed and well-nourished.  HENT:  Head: Normocephalic.  Neck: Normal range of motion.  Cardiovascular: Normal rate.  Pulmonary/Chest: Effort normal.  Musculoskeletal: Normal range of motion.  Neurological: She is alert and oriented to person, place, and time.  Skin: Skin is warm and dry.  Psychiatric: She has a normal mood  and affect. Her behavior is normal.  Vitals reviewed.   RECENT LABS AND TESTS: BMET    Component Value Date/Time   NA 138 01/13/2018 1023   K 4.7 01/13/2018 1023   CL 100 01/13/2018 1023   CO2 23 01/13/2018 1023   GLUCOSE 94 01/13/2018 1023   GLUCOSE 99 06/29/2017 0840   BUN 33 (H) 01/13/2018 1023   CREATININE 0.98 01/13/2018 1023   CALCIUM 9.8 01/13/2018 1023   GFRNONAA 61 01/13/2018 1023   GFRAA 71 01/13/2018 1023   Lab Results  Component Value Date   HGBA1C 5.7 (H) 01/13/2018   Lab Results  Component Value Date   INSULIN 27.7 (H) 01/13/2018   CBC    Component Value Date/Time   WBC 10.0 01/08/2016 0333   RBC 4.38 01/08/2016 0333   HGB 13.1 01/08/2016 0333   HCT 39.9 01/08/2016 0333   PLT 217 01/08/2016 0333   MCV 91.1 01/08/2016 0333   MCH 29.9 01/08/2016 0333   MCHC 32.8 01/08/2016 0333   RDW 14.2 01/08/2016 0333   LYMPHSABS 2.6 01/06/2016 1619   MONOABS 0.4 01/06/2016 1619   EOSABS 0.2 01/06/2016 1619   BASOSABS 0.0 01/06/2016 1619   Iron/TIBC/Ferritin/ %Sat No results found for: IRON, TIBC, FERRITIN, IRONPCTSAT Lipid Panel     Component Value Date/Time   CHOL 188 06/29/2017 0840   TRIG 207 (H) 06/29/2017 0840   TRIG 114 02/04/2010   HDL 43 06/29/2017 0840   CHOLHDL 4.4 06/29/2017 0840   VLDL 41 (H) 06/29/2017 0840   LDLCALC 104 (H) 06/29/2017 0840   LDLDIRECT 156.4 08/02/2012 1713   Hepatic Function Panel     Component Value Date/Time   PROT 7.8 01/13/2018 1023   ALBUMIN 4.6 01/13/2018 1023   AST 23 01/13/2018 1023   ALT 16 01/13/2018 1023   ALKPHOS 106 01/13/2018 1023   BILITOT 0.2 01/13/2018 1023   BILIDIR <0.1 (L) 06/29/2017 0840   IBILI NOT CALCULATED 06/29/2017 0840      Component Value Date/Time   TSH 3.000 01/13/2018 1023   TSH 2.636 05/25/2015  0930   TSH 2.23 02/24/2014 1553   TSH 2.52 10/19/2013 1641    Ref. Range 01/13/2018 10:23  Vitamin D, 25-Hydroxy Latest Ref Range: 30.0 - 100.0 ng/mL 29.4 (L)   ASSESSMENT AND  PLAN: Vitamin D deficiency  Prediabetes - Plan: metFORMIN (GLUCOPHAGE) 500 MG tablet  Other hyperlipidemia  Essential hypertension  At risk for osteoporosis  Class 3 severe obesity with serious comorbidity and body mass index (BMI) of 40.0 to 44.9 in adult, unspecified obesity type (HCC)  PLAN: Vitamin D Deficiency Jamesetta Sohyllis was informed that low vitamin D levels contributes to fatigue and are associated with obesity, breast, and colon cancer. She agrees to continue to take prescription Vit D @50 ,000 IU every week and will follow up for routine testing of vitamin D, at least 2-3 times per year. She was informed of the risk of over-replacement of vitamin D and agrees to not increase her dose unless she discusses this with us first. Agrees to follow up with our clinic as directed.   Pre-Diabetes Jamesetta Sohyllis will continue to work on weight loss, exercise, and decreasing simple carbohydrates in her diet to help decrease the risk of diabetes. We dicussed metformin including benefits and risks. She was informed that eating too many simple carbohydrates or too many calories at one sitting increases the likelihood of GI side effects. Jamesetta Sohyllis agrees to start metformin 500 mg daily #30 with no refills for now and a prescription was written today. Jamesetta Sohyllis agreed to follow up with us as directed to monitor her progress. Discussed prediabetes in detail per self and registered dietician.   Hyperlipidemia Jamesetta Sohyllis was informed of the American Heart Association Guidelines emphasizing intensive lifestyle modifications as the first line treatment for hyperlipidemia. We discussed many lifestyle modifications today in depth, and Jamesetta Sohyllis will continue to work on decreasing saturated fats such as fatty red meat, butter and many fried foods. She will also increase vegetables and lean protein in her diet and continue to work on exercise and weight loss efforts. She agrees to continue atorvastatin as prescribed. Agrees to  follow up with our clinic as directed.   Hypertension We discussed sodium restriction, working on healthy weight loss, and a regular exercise program as the means to achieve improved blood pressure control. Jamesetta Sohyllis agreed with this plan and agreed to follow up as directed. We will continue to monitor her blood pressure as well as her progress with the above lifestyle modifications. She will continue her medications as prescribed and will watch for signs of hypotension as she continues her lifestyle modifications. Agrees to follow up with our clinic as directed.   At risk for osteopenia and osteoporosis Jamesetta Sohyllis was given extended  (15 minutes) osteoporosis prevention counseling today. Jamesetta Sohyllis is at risk for osteopenia and osteoporosis due to her vitamin D deficiency. She was encouraged to take her vitamin D and follow her higher calcium diet and increase strengthening exercise to help strengthen her bones and decrease her risk of osteopenia and osteoporosis.  Obesity Jamesetta Sohyllis is currently in the action stage of change. As such, her goal is to continue with weight loss efforts She has agreed to follow the Category 2 plan  Referral sent to Primary Care Provider Dr. Gershon CraneStephen Fry at [facility].  Jamesetta Sohyllis has been instructed to work up to a goal of 150 minutes of combined cardio and strengthening exercise per week for weight loss and overall health benefits. We discussed the following Behavioral Modification Strategies today: increasing lean protein intake, decreasing simple carbohydrates , increasing vegetables,  decrease eating out, increasing water intake, no skipping meals, and work on meal planning and easy cooking plans.    Adrian has agreed to follow up with our clinic in 2 weeks. She was informed of the importance of frequent follow up visits to maximize her success with intensive lifestyle modifications for her multiple health conditions.   OBESITY BEHAVIORAL INTERVENTION  VISIT  Today's visit was # 2   Starting weight: 269 lb Starting date: 01/13/18 Today's weight : Weight: 267 lb (121.1 kg)  Today's date: 01/27/18 Total lbs lost to date: 2 lb    ASK: We discussed the diagnosis of obesity with Lysle Morales today and [name] agreed to give [place] permission to discuss obesity behavioral modification therapy today.  ASSESS: Tobie has the diagnosis of obesity and her BMI today is 44.43 Yarnell is in the action stage of change   ADVISE: Briellah was educated on the multiple health risks of obesity as well as the benefit of weight loss to improve her health. She was advised of the need for long term treatment and the importance of lifestyle modifications to improve her current health and to decrease her risk of future health problems.  AGREE: Multiple dietary modification options and treatment options were discussed and  Beverlyn agreed to follow the recommendations documented in the above note.  ARRANGE: Adalin was educated on the importance of frequent visits to treat obesity as outlined per CMS and USPSTF guidelines and agreed to schedule her next follow up appointment today.  Otis Peak, am acting as transcriptionist for [employer], DO   I have reviewed the above documentation for accuracy and completeness, and I agree with the above. -Corinna Capra, DO

## 2018-02-08 ENCOUNTER — Ambulatory Visit (INDEPENDENT_AMBULATORY_CARE_PROVIDER_SITE_OTHER): Payer: Medicare HMO | Admitting: Bariatrics

## 2018-02-08 VITALS — BP 135/77 | HR 66 | Temp 97.9°F | Ht 65.0 in | Wt 265.0 lb

## 2018-02-08 DIAGNOSIS — Z6841 Body Mass Index (BMI) 40.0 and over, adult: Secondary | ICD-10-CM | POA: Diagnosis not present

## 2018-02-08 DIAGNOSIS — I10 Essential (primary) hypertension: Secondary | ICD-10-CM | POA: Diagnosis not present

## 2018-02-08 DIAGNOSIS — R7303 Prediabetes: Secondary | ICD-10-CM | POA: Diagnosis not present

## 2018-02-15 NOTE — Progress Notes (Signed)
Office: [phone]  /  Fax: [phone]   HPI:   Chief Complaint: OBESITY Caitlyn [name] is here to discuss her progress with her obesity treatment plan. She is on the Category 2 plan and is following her eating plan approximately 80 % of the time. She states she is exercising 0 minutes 0 times per week. Caitlyn [name] was referred to Dr. Abran Cantor at [facility]. She is not struggling with the plan.  Her weight is 265 lb (120.2 kg) today and has had a weight loss of 2 pounds over a period of 2 weeks since her last visit. She has lost 4 lbs since starting treatment with [place].  Hypertension Caitlyn [name] is a 67 y.o. female with hypertension. Caitlyn [name] has a history of coronary artery disease and a myocardial infarction.  She is working on weight loss to help control her blood pressure with the goal of decreasing her risk of heart attack and stroke. She is taking Coreg 3.125mg  and Edarbyclor 40-12.5mg  and seeing her cardiologist.  Caitlyn [name] has a diagnosis of pre-diabetes based on her elevated Hgb A1c and was informed this puts her at greater risk of developing diabetes. Her last Hgb A1c was 5.7 and her Insulin was 27.7 on 01/13/18. She is taking metformin currently and continues to work on diet and exercise to decrease risk of diabetes. She denies polyphagia or polydipsia.  ALLERGIES: Allergies  Allergen Reactions  . Amlodipine Swelling  . Statins Other (See Comments)    myalgia    MEDICATIONS: Current Outpatient Medications on File Prior to Visit  Medication Sig Dispense Refill  . acetaminophen (TYLENOL) 500 MG tablet Take 1,000 mg by mouth every 6 (six) hours as needed for moderate pain.    Marland Kitchen albuterol (PROVENTIL HFA;VENTOLIN HFA) 108 (90 BASE) MCG/ACT inhaler Inhale 2 puffs into the lungs every 6 (six) hours as needed for wheezing or shortness of breath.     . allopurinol (ZYLOPRIM) 100 MG tablet Take 1 tablet (100 mg total) by mouth daily. (Patient taking differently: Take 300 mg by mouth  daily. ) 90 tablet 0  . aspirin 81 MG tablet Take 81 mg by mouth daily. Reported on 04/04/2015    . atorvastatin (LIPITOR) 20 MG tablet Take 20 mg by mouth daily.     . Azilsartan-Chlorthalidone (EDARBYCLOR) 40-12.5 MG TABS Take by mouth.    . carvedilol (COREG) 3.125 MG tablet Take 3.125 mg by mouth 2 (two) times daily with a meal.    . clonazePAM (KLONOPIN) 1 MG tablet Take 1 mg by mouth at bedtime.     . clopidogrel (PLAVIX) 75 MG tablet   2  . colchicine (COLCRYS) 0.6 MG tablet Take 0.6 mg by mouth daily as needed (for flare ups). Flare ups    . EDARBI 40 MG TABS   2  . furosemide (LASIX) 40 MG tablet Take 40 mg by mouth.    . levothyroxine (SYNTHROID, LEVOTHROID) 125 MCG tablet Take 125 mcg by mouth daily before breakfast.    . metFORMIN (GLUCOPHAGE) 500 MG tablet Take 1 tablet (500 mg total) by mouth daily with breakfast. 30 tablet 0  . methocarbamol (ROBAXIN) 500 MG tablet Take 500 mg by mouth every 8 (eight) hours as needed for muscle spasms.     . Pediatric Multivit-Minerals-C (GUMMI BEAR MULTIVITAMIN/MIN) CHEW Chew 2 tablets by mouth daily.    . Vitamin D, Ergocalciferol, (DRISDOL) 50000 units CAPS capsule Take 50,000 Units by mouth every Monday.     No current facility-administered medications on file  prior to visit.     PAST MEDICAL HISTORY: Past Medical History:  Diagnosis Date  . Anxiety   . Arthritis    "legs" (01/07/2016)  . Back pain   . CAD 06/2009   a.  s/p MI 4/11: tx with DES x 2 to RCA;  b.  LHC 03/17/11: LAD 30-40%, distal LAD 30-40%, OM2 40-50%, RCA stent patent, EF greater than 70%.;  c.  Lexiscan Myoview (12/15):  Mild apical thinning, no ischemia, EF 61%; normal study  . CAROTID STENOSIS    carotid Dopplers 03/25/11: RICA 60-79%; LICA 0-39%.  Follow up recommended in 6 months.  . Constipation   . Depression   . Gout    "on RX for it" (01/07/2016)  . Heart disease   . HYPERLIPIDEMIA   . HYPERTENSION   . HYPOTHYROIDISM    post ablation tx Graves  .  Hypothyroidism   . Joint pain   . Myocardial infarction [facility]) 2012   "mini"  . OSA on CPAP   . Osteoarthritis   . Pre-diabetes   . PVD   . Sleep apnea   . TOBACCO ABUSE quit 06/2009  . Varicose veins   . VITAMIN D DEFICIENCY    DEXA 04/2009 normal    PAST SURGICAL HISTORY: Past Surgical History:  Procedure Laterality Date  . CARDIAC CATHETERIZATION N/A 01/07/2016   Procedure: Left Heart Cath and Coronary Angiography;  Surgeon: Rinaldo Cloud, MD;  Location: [facility] INVASIVE CV LAB;  Service: Cardiovascular;  Laterality: N/A;  . CARDIAC CATHETERIZATION N/A 01/07/2016   Procedure: Coronary Stent Intervention;  Surgeon: Rinaldo Cloud, MD;  Location: [facility] INVASIVE CV LAB;  Service: Cardiovascular;  Laterality: N/A;  . CAROTID STENT Left    bilateral carotid artery disease post stent of left carotid  . CESAREAN SECTION  1985   "twins"  . CORONARY ANGIOPLASTY    . LEFT HEART CATHETERIZATION WITH CORONARY ANGIOGRAM N/A 03/17/2011   Procedure: LEFT HEART CATHETERIZATION WITH CORONARY ANGIOGRAM;  Surgeon: Herby Abraham, MD;  Location: [facility] CATH LAB;  Service: Cardiovascular;  Laterality: N/A;  . PERIPHERAL VASCULAR CATHETERIZATION N/A 04/06/2015   Procedure: Abdominal Aortogram;  Surgeon: Sherren Kerns, MD;  Location: [facility] INVASIVE CV LAB;  Service: Cardiovascular;  Laterality: N/A;  . RADIOACTIVE PLAQUE INSERTION     Graves disease post radioactive treatment complicated hypothyroidism    SOCIAL HISTORY: Social History   Tobacco Use  . Smoking status: Former Smoker    Packs/day: 1.00    Years: 35.00    Pack years: 35.00    Last attempt to quit: 06/30/2009    Years since quitting: 8.6  . Smokeless tobacco: Never Used  Substance Use Topics  . Alcohol use: Yes    Alcohol/week: 2.0 standard drinks    Types: 2 Glasses of wine per week    Comment: occ  . Drug use: No    FAMILY HISTORY: Family History  Problem Relation Age of Onset  . Heart attack Mother   . Stroke Mother   . Cancer  Mother   . Varicose Veins Mother   . Thyroid disease Mother   . Cancer Father   . Heart disease Father   . Coronary artery disease Other   . Hypertension Other   . Hypertension Sister     ROS: Review of Systems  Constitutional: Positive for weight loss.  Genitourinary:       Negative for polyuria.  Endo/Heme/Allergies:       Negative for polyphagia.    PHYSICAL  EXAM: Blood pressure 135/77, pulse 66, temperature 97.9 F (36.6 C), temperature source Oral, height 5\' 5"  (1.651 m), weight 265 lb (120.2 kg), SpO2 98 %. Body mass index is 44.1 kg/m. Physical Exam  Constitutional: She is oriented to person, place, and time. She appears well-developed and well-nourished.  Cardiovascular: Normal rate.  Pulmonary/Chest: Effort normal.  Musculoskeletal: Normal range of motion.  Neurological: She is oriented to person, place, and time.  Skin: Skin is warm and dry.  Psychiatric: She has a normal mood and affect. Her behavior is normal.  Vitals reviewed.   RECENT LABS AND TESTS: BMET    Component Value Date/Time   NA 138 01/13/2018 1023   K 4.7 01/13/2018 1023   CL 100 01/13/2018 1023   CO2 23 01/13/2018 1023   GLUCOSE 94 01/13/2018 1023   GLUCOSE 99 06/29/2017 0840   BUN 33 (H) 01/13/2018 1023   CREATININE 0.98 01/13/2018 1023   CALCIUM 9.8 01/13/2018 1023   GFRNONAA 61 01/13/2018 1023   GFRAA 71 01/13/2018 1023   Lab Results  Component Value Date   HGBA1C 5.7 (H) 01/13/2018   Lab Results  Component Value Date   INSULIN 27.7 (H) 01/13/2018   CBC    Component Value Date/Time   WBC 10.0 01/08/2016 0333   RBC 4.38 01/08/2016 0333   HGB 13.1 01/08/2016 0333   HCT 39.9 01/08/2016 0333   PLT 217 01/08/2016 0333   MCV 91.1 01/08/2016 0333   MCH 29.9 01/08/2016 0333   MCHC 32.8 01/08/2016 0333   RDW 14.2 01/08/2016 0333   LYMPHSABS 2.6 01/06/2016 1619   MONOABS 0.4 01/06/2016 1619   EOSABS 0.2 01/06/2016 1619   BASOSABS 0.0 01/06/2016 1619   Iron/TIBC/Ferritin/  %Sat No results found for: IRON, TIBC, FERRITIN, IRONPCTSAT Lipid Panel     Component Value Date/Time   CHOL 188 06/29/2017 0840   TRIG 207 (H) 06/29/2017 0840   TRIG 114 02/04/2010   HDL 43 06/29/2017 0840   CHOLHDL 4.4 06/29/2017 0840   VLDL 41 (H) 06/29/2017 0840   LDLCALC 104 (H) 06/29/2017 0840   LDLDIRECT 156.4 08/02/2012 1713   Hepatic Function Panel     Component Value Date/Time   PROT 7.8 01/13/2018 1023   ALBUMIN 4.6 01/13/2018 1023   AST 23 01/13/2018 1023   ALT 16 01/13/2018 1023   ALKPHOS 106 01/13/2018 1023   BILITOT 0.2 01/13/2018 1023   BILIDIR <0.1 (L) 06/29/2017 0840   IBILI NOT CALCULATED 06/29/2017 0840      Component Value Date/Time   TSH 3.000 01/13/2018 1023   TSH 2.636 05/25/2015 0930   TSH 2.23 02/24/2014 1553   TSH 2.52 10/19/2013 1641   Results for CAROLLEE, NUSSBAUMER (MRN [id number]) as of 02/15/2018 13:03  Ref. Range 01/13/2018 10:23  Vitamin D, 25-Hydroxy Latest Ref Range: 30.0 - 100.0 ng/mL 29.4 (L)   ASSESSMENT AND PLAN: Essential hypertension  Prediabetes  Class 3 severe obesity with serious comorbidity and body mass index (BMI) of 40.0 to 44.9 in adult, unspecified obesity type (HCC)  PLAN:  Hypertension We discussed sodium restriction, working on healthy weight loss, and a regular exercise program as the means to achieve improved blood pressure control. We will continue to monitor her blood pressure as well as her progress with the above lifestyle modifications. She will follow up with her cardiologist, continue her medications as prescribed, and will watch for signs of hypotension as she continues her lifestyle modifications. She is waiting on her referral to primary care (  PCP) which is incomplete. Caitlyn [name] agreed with this plan and agreed to follow up as directed in 2 weeks.  Pre-Diabetes Caitlyn [name] will continue to work on weight loss, exercise, and decreasing simple carbohydrates in her diet to help decrease the risk of diabetes. She  was informed that eating too many simple carbohydrates or too many calories at one sitting increases the likelihood of GI side effects. Caitlyn [name] agreed to continue taking metformin and a prescription was not written today. Caitlyn [name] agreed to follow up with us as directed to monitor her progress.  Obesity Caitlyn [name] is currently in the action stage of change. As such, her goal is to continue with weight loss efforts. She has agreed to follow the Category 2 plan and increase protein, water intake and sodium in her diet. She agrees to cut down on snack calories and she was given the "Recipes" information handout. Caitlyn [name] has been instructed to work up to a goal of 150 minutes of combined cardio and strengthening exercise per week for weight loss and overall health benefits. We discussed the following Behavioral Modification Strategies today: increasing lean protein intake, decreasing simple carbohydrates, increasing vegetables, increase H2O intake, holiday eating strategies, and celebration eating strategies.  Caitlyn [name] has agreed to follow up with our clinic in 2 weeks. She was informed of the importance of frequent follow up visits to maximize her success with intensive lifestyle modifications for her multiple health conditions.   OBESITY BEHAVIORAL INTERVENTION VISIT  Today's visit was # 3  Starting weight: 269 lbs Starting date: 01/13/18 Today's weight : Weight: 265 lb (120.2 kg)  Today's date: 02/08/2018 Total lbs lost to date: 4 At least 15 minutes were spent on discussing the following behavioral intervention visit.  ASK: We discussed the diagnosis of obesity with Caitlyn [name] today and Caitlyn [name] agreed to give us permission to discuss obesity behavioral modification therapy today.  ASSESS: Caitlyn [name] has the diagnosis of obesity and her BMI today is 44.1. Caitlyn [name] is in the action stage of change.   ADVISE: Caitlyn [name] was educated on the multiple health risks of obesity as well as the benefit  of weight loss to improve her health. She was advised of the need for long term treatment and the importance of lifestyle modifications to improve her current health and to decrease her risk of future health problems.  AGREE: Multiple dietary modification options and treatment options were discussed and Caitlyn [name] agreed to follow the recommendations documented in the above note.  ARRANGE: Caitlyn [name] was educated on the importance of frequent visits to treat obesity as outlined per CMS and USPSTF guidelines and agreed to schedule her next follow up appointment today.  I, Kirke Corinara Soares, am acting as Energy managertranscriptionist for [employer] Corporation[employer]el A. Manson Passey, DO  I have reviewed the above documentation for accuracy and completeness, and I agree with the above. -Corinna CapraAngel , DO

## 2018-02-23 ENCOUNTER — Other Ambulatory Visit (INDEPENDENT_AMBULATORY_CARE_PROVIDER_SITE_OTHER): Payer: Self-pay | Admitting: Bariatrics

## 2018-02-23 ENCOUNTER — Ambulatory Visit (INDEPENDENT_AMBULATORY_CARE_PROVIDER_SITE_OTHER): Payer: Medicare HMO | Admitting: Bariatrics

## 2018-02-23 ENCOUNTER — Encounter (INDEPENDENT_AMBULATORY_CARE_PROVIDER_SITE_OTHER): Payer: Self-pay | Admitting: Bariatrics

## 2018-02-23 VITALS — BP 129/72 | HR 70 | Temp 97.8°F | Ht 65.0 in | Wt 266.0 lb

## 2018-02-23 DIAGNOSIS — R7303 Prediabetes: Secondary | ICD-10-CM

## 2018-02-23 DIAGNOSIS — E559 Vitamin D deficiency, unspecified: Secondary | ICD-10-CM

## 2018-02-23 DIAGNOSIS — I10 Essential (primary) hypertension: Secondary | ICD-10-CM | POA: Diagnosis not present

## 2018-02-23 DIAGNOSIS — Z6841 Body Mass Index (BMI) 40.0 and over, adult: Secondary | ICD-10-CM

## 2018-02-23 MED ORDER — METFORMIN HCL 500 MG PO TABS: 500.0000 mg | ORAL_TABLET | Freq: Every day | ORAL | 0 refills | Status: DC

## 2018-02-23 NOTE — Progress Notes (Signed)
Office: [phone]  /  Fax: [phone]   HPI:   Chief Complaint: OBESITY Caitlyn [name] is here to discuss her progress with her obesity treatment plan. She is on the Category 2 plan and is following her eating plan approximately 75 % of the time. She states she is exercising 0 minutes 0 times per week. Caitlyn [name] is struggling with the Category 2 plan. She is eating the market salad (grilled chicken).  Her weight is 266 lb (120.7 kg) today and has had a weight gain of 1 pound over a period of 2 weeks since her last visit. She has lost 3 lbs since starting treatment with [place].  Pre-Diabetes Caitlyn [name] has a diagnosis of prediabetes based on her elevated Hgb A1c and was informed this puts her at greater risk of developing diabetes. Caitlyn [name] is taking metformin currently and she continues to work on diet and exercise to decrease risk of diabetes. She denies hypoglycemia.  Hypertension Caitlyn [name] is a 67 y.o. female with hypertension. Caitlyn [name] denies lightheadedness. She is working weight loss to help control her blood pressure with the goal of decreasing her risk of heart attack and stroke. Caitlyn [name] blood pressure is well controlled.  Vitamin D deficiency Caitlyn [name] has a diagnosis of vitamin D deficiency. She is currently taking high dose vit D and denies nausea, vomiting or muscle weakness.  Caitlyn [name]: Caitlyn [name]  Allergen Reactions  . Amlodipine Swelling  . Statins Other (See Comments)    myalgia    MEDICATIONS: Current Outpatient Medications on File Prior to Visit  Medication Sig Dispense Refill  . acetaminophen (TYLENOL) 500 MG tablet Take 1,000 mg by mouth every 6 (six) hours as needed for moderate pain.    Marland Kitchen albuterol (PROVENTIL HFA;VENTOLIN HFA) 108 (90 BASE) MCG/ACT inhaler Inhale 2 puffs into the lungs every 6 (six) hours as needed for wheezing or shortness of breath.     . allopurinol (ZYLOPRIM) 100 MG tablet Take 1 tablet (100 mg total) by mouth daily. (Patient taking  differently: Take 300 mg by mouth daily. ) 90 tablet 0  . aspirin 81 MG tablet Take 81 mg by mouth daily. Reported on 04/04/2015    . atorvastatin (LIPITOR) 20 MG tablet Take 20 mg by mouth daily.     . Azilsartan-Chlorthalidone (EDARBYCLOR) 40-12.5 MG TABS Take by mouth.    . carvedilol (COREG) 3.125 MG tablet Take 3.125 mg by mouth 2 (two) times daily with a meal.    . clonazePAM (KLONOPIN) 1 MG tablet Take 1 mg by mouth at bedtime.     . clopidogrel (PLAVIX) 75 MG tablet   2  . colchicine (COLCRYS) 0.6 MG tablet Take 0.6 mg by mouth daily as needed (for flare ups). Flare ups    . EDARBI 40 MG TABS   2  . furosemide (LASIX) 40 MG tablet Take 40 mg by mouth.    . levothyroxine (SYNTHROID, LEVOTHROID) 125 MCG tablet Take 125 mcg by mouth daily before breakfast.    . methocarbamol (ROBAXIN) 500 MG tablet Take 500 mg by mouth every 8 (eight) hours as needed for muscle spasms.     . Pediatric Multivit-Minerals-C (GUMMI BEAR MULTIVITAMIN/MIN) CHEW Chew 2 tablets by mouth daily.    . Vitamin D, Ergocalciferol, (DRISDOL) 50000 units CAPS capsule Take 50,000 Units by mouth every Monday.     No current facility-administered medications on file prior to visit.     PAST MEDICAL HISTORY: Past Medical History:  Diagnosis Date  . Anxiety   .  Arthritis    "legs" (01/07/2016)  . Back pain   . CAD 06/2009   a.  s/p MI 4/11: tx with DES x 2 to RCA;  b.  LHC 03/17/11: LAD 30-40%, distal LAD 30-40%, OM2 40-50%, RCA stent patent, EF greater than 70%.;  c.  Lexiscan Myoview (12/15):  Mild apical thinning, no ischemia, EF 61%; normal study  . CAROTID STENOSIS    carotid Dopplers 03/25/11: RICA 60-79%; LICA 0-39%.  Follow up recommended in 6 months.  . Constipation   . Depression   . Gout    "on RX for it" (01/07/2016)  . Heart disease   . HYPERLIPIDEMIA   . HYPERTENSION   . HYPOTHYROIDISM    post ablation tx Graves  . Hypothyroidism   . Joint pain   . Myocardial infarction [facility]) 2012   "mini"  . OSA  on CPAP   . Osteoarthritis   . Pre-diabetes   . PVD   . Sleep apnea   . TOBACCO ABUSE quit 06/2009  . Varicose veins   . VITAMIN D DEFICIENCY    DEXA 04/2009 normal    PAST SURGICAL HISTORY: Past Surgical History:  Procedure Laterality Date  . CARDIAC CATHETERIZATION N/A 01/07/2016   Procedure: Left Heart Cath and Coronary Angiography;  Surgeon: Rinaldo Cloud, MD;  Location: [facility] INVASIVE CV LAB;  Service: Cardiovascular;  Laterality: N/A;  . CARDIAC CATHETERIZATION N/A 01/07/2016   Procedure: Coronary Stent Intervention;  Surgeon: Rinaldo Cloud, MD;  Location: [facility] INVASIVE CV LAB;  Service: Cardiovascular;  Laterality: N/A;  . CAROTID STENT Left    bilateral carotid artery disease post stent of left carotid  . CESAREAN SECTION  1985   "twins"  . CORONARY ANGIOPLASTY    . LEFT HEART CATHETERIZATION WITH CORONARY ANGIOGRAM N/A 03/17/2011   Procedure: LEFT HEART CATHETERIZATION WITH CORONARY ANGIOGRAM;  Surgeon: Herby Abraham, MD;  Location[facility] C[facility] LAB;  Service: Cardiovascular;  Laterality: N/A;  . PERIPHERAL VASCULAR CATHETERIZATION N/A 04/06/2015   Procedure: Abdominal Aortogram;  Surgeon: Sherren Kerns, MD;  Location: [facility] INVASIVE CV LAB;  Service: Cardiovascular;  Laterality: N/A;  . RADIOACTIVE PLAQUE INSERTION     Graves disease post radioactive treatment complicated hypothyroidism    SOCIAL HISTORY: Social History   Tobacco Use  . Smoking status: Former Smoker    Packs/day: 1.00    Years: 35.00    Pack years: 35.00    Last attempt to quit: 06/30/2009    Years since quitting: 8.6  . Smokeless tobacco: Never Used  Substance Use Topics  . Alcohol use: Yes    Alcohol/week: 2.0 standard drinks    Types: 2 Glasses of wine per week    Comment: occ  . Drug use: No    FAMILY HISTORY: Family History  Problem Relation Age of Onset  . Heart attack Mother   . Stroke Mother   . Cancer Mother   . Varicose Veins Mother   . Thyroid disease Mother   . Cancer Father   .  Heart disease Father   . Coronary artery disease Other   . Hypertension Other   . Hypertension Sister     ROS: Review of Systems  Constitutional: Negative for weight loss.  Gastrointestinal: Negative for nausea and vomiting.  Musculoskeletal:       Negative for muscle weakness  Neurological:       Negative for lightheadedness  Endo/Heme/Caitlyn [name]:       Negative for hypoglycemia    PHYSICAL EXAM: Blood pressure  129/72, pulse 70, temperature 97.8 F (36.6 C), temperature source Oral, height 5\' 5"  (1.651 m), weight 266 lb (120.7 kg), SpO2 98 %. Body mass index is 44.26 kg/m. Physical Exam  Constitutional: She is oriented to person, place, and time. She appears well-developed and well-nourished.  Cardiovascular: Normal rate.  Pulmonary/Chest: Effort normal.  Musculoskeletal: Normal range of motion. She exhibits edema (1+ edema noted in bilateral lower extremities).  Neurological: She is oriented to person, place, and time.  Skin: Skin is warm and dry.  Psychiatric: She has a normal mood and affect. Her behavior is normal.  Vitals reviewed.   RECENT LABS AND TESTS: BMET    Component Value Date/Time   NA 138 01/13/2018 1023   K 4.7 01/13/2018 1023   CL 100 01/13/2018 1023   CO2 23 01/13/2018 1023   GLUCOSE 94 01/13/2018 1023   GLUCOSE 99 06/29/2017 0840   BUN 33 (H) 01/13/2018 1023   CREATININE 0.98 01/13/2018 1023   CALCIUM 9.8 01/13/2018 1023   GFRNONAA 61 01/13/2018 1023   GFRAA 71 01/13/2018 1023   Lab Results  Component Value Date   HGBA1C 5.7 (H) 01/13/2018   Lab Results  Component Value Date   INSULIN 27.7 (H) 01/13/2018   CBC    Component Value Date/Time   WBC 10.0 01/08/2016 0333   RBC 4.38 01/08/2016 0333   HGB 13.1 01/08/2016 0333   HCT 39.9 01/08/2016 0333   PLT 217 01/08/2016 0333   MCV 91.1 01/08/2016 0333   MCH 29.9 01/08/2016 0333   MCHC 32.8 01/08/2016 0333   RDW 14.2 01/08/2016 0333   LYMPHSABS 2.6 01/06/2016 1619   MONOABS 0.4  01/06/2016 1619   EOSABS 0.2 01/06/2016 1619   BASOSABS 0.0 01/06/2016 1619   Iron/TIBC/Ferritin/ %Sat No results found for: IRON, TIBC, FERRITIN, IRONPCTSAT Lipid Panel     Component Value Date/Time   CHOL 188 06/29/2017 0840   TRIG 207 (H) 06/29/2017 0840   TRIG 114 02/04/2010   HDL 43 06/29/2017 0840   CHOLHDL 4.4 06/29/2017 0840   VLDL 41 (H) 06/29/2017 0840   LDLCALC 104 (H) 06/29/2017 0840   LDLDIRECT 156.4 08/02/2012 1713   Hepatic Function Panel     Component Value Date/Time   PROT 7.8 01/13/2018 1023   ALBUMIN 4.6 01/13/2018 1023   AST 23 01/13/2018 1023   ALT 16 01/13/2018 1023   ALKPHOS 106 01/13/2018 1023   BILITOT 0.2 01/13/2018 1023   BILIDIR <0.1 (L) 06/29/2017 0840   IBILI NOT CALCULATED 06/29/2017 0840      Component Value Date/Time   TSH 3.000 01/13/2018 1023   TSH 2.636 05/25/2015 0930   TSH 2.23 02/24/2014 1553   TSH 2.52 10/19/2013 1641     Ref. Range 01/13/2018 10:23  Vitamin D, 25-Hydroxy Latest Ref Range: 30.0 - 100.0 ng/mL 29.4 (L)   ASSESSMENT AND PLAN: Prediabetes - Plan: metFORMIN (GLUCOPHAGE) 500 MG tablet  Essential hypertension  Vitamin D deficiency  Class 3 severe obesity with serious comorbidity and body mass index (BMI) of 40.0 to 44.9 in adult, unspecified obesity type (HCC) - Plan: Ambulatory referral to General Surgery  PLAN:  Pre-Diabetes Caitlyn [name] will continue to work on weight loss, exercise, and decreasing simple carbohydrates in her diet to help decrease the risk of diabetes. We dicussed metformin including benefits and risks. She was informed that eating too many simple carbohydrates or too many calories at one sitting increases the likelihood of GI side effects. Caitlyn [name] agrees to continue metformin 500 mg  1 tablet daily #30 with no refills and follow up with us as directed to monitor her progress.  Hypertension We discussed sodium restriction, working on healthy weight loss, and a regular exercise program as the means  to achieve improved blood pressure control. Caitlyn [name] with this plan and [name] to follow up as directed. We will continue to monitor her blood pressure as well as her progress with the above lifestyle modifications. She will continue her medications as prescribed and will watch for signs of hypotension as she continues her lifestyle modifications.  Vitamin D Deficiency Caitlyn [name] was informed that low vitamin D levels contributes to fatigue and are associated with obesity, breast, and colon cancer. She will continue to take high dose prescription Vit D @50 ,000 IU every week and will follow up for routine testing of vitamin D, at least 2-3 times per year. She was informed of the risk of over-replacement of vitamin D and agrees to not increase her dose unless she discusses this with us first.  Obesity Caitlyn [name] is currently in the action stage of change. As such, her goal is to continue with weight loss efforts She has [name] to follow the Category 2 plan Caitlyn [name] has been instructed to work up to a goal of 150 minutes of combined cardio and strengthening exercise per week for weight loss and overall health benefits. We discussed the following Behavioral Modification Strategies today: increase H2O intake to 64+ ounces daily and do not drink in the excess of 1 1/2 gallons, increasing lean protein intake, decreasing simple carbohydrates, increasing vegetables, decreasing sodium intake, holiday eating strategies and celebration eating strategies We will refer to bariatric surgery for evaluation ([facility]  Caitlyn [name] has [name] to follow up with our clinic in 2 weeks and our registered dietician in 1 week. She was informed of the importance of frequent follow up visits to maximize her success with intensive lifestyle modifications for her multiple health conditions.   OBESITY BEHAVIORAL INTERVENTION VISIT  Today's visit was # 4   Starting weight: 269 lbs Starting date:  01/13/2018 Today's weight : 266 lbs Today's date: 02/23/2018 Total lbs lost to date: 3 At least 15 minutes were spent on discussing the following behavioral intervention visit.   ASK: We discussed the diagnosis of obesity with Caitlyn [name] today and Caitlyn [name] to give us permission to discuss obesity behavioral modification therapy today.  ASSESS: Caitlyn [name] has the diagnosis of obesity and her BMI today is 44.26 Caitlyn [name] is in the action stage of change   ADVISE: Caitlyn [name] was educated on the multiple health risks of obesity as well as the benefit of weight loss to improve her health. She was advised of the need for long term treatment and the importance of lifestyle modifications to improve her current health and to decrease her risk of future health problems.  AGREE: Multiple dietary modification options and treatment options were discussed and  Caitlyn [name] to follow the recommendations documented in the above note.  ARRANGE: Caitlyn [name] was educated on the importance of frequent visits to treat obesity as outlined per CMS and USPSTF guidelines and [name] to schedule her next follow up appointment today.  Caitlyn LoronI, Joanne Murray, am acting as Energy managertranscriptionist for [employer]el A. Manson PasseyBrown, DO  I have reviewed the above documentation for accuracy and completeness, and I agree with the above. -Corinna CapraAngel Eino Whitner, DO

## 2018-03-15 ENCOUNTER — Encounter (INDEPENDENT_AMBULATORY_CARE_PROVIDER_SITE_OTHER): Payer: Self-pay

## 2018-03-15 ENCOUNTER — Ambulatory Visit (INDEPENDENT_AMBULATORY_CARE_PROVIDER_SITE_OTHER): Payer: Medicare HMO | Admitting: Dietician

## 2018-03-15 ENCOUNTER — Ambulatory Visit (INDEPENDENT_AMBULATORY_CARE_PROVIDER_SITE_OTHER): Payer: Medicare HMO | Admitting: Bariatrics

## 2018-03-15 ENCOUNTER — Encounter (INDEPENDENT_AMBULATORY_CARE_PROVIDER_SITE_OTHER): Payer: Self-pay | Admitting: Bariatrics

## 2018-03-15 VITALS — BP 120/84 | HR 61 | Temp 97.6°F | Ht 65.0 in | Wt 264.0 lb

## 2018-03-15 DIAGNOSIS — Z6841 Body Mass Index (BMI) 40.0 and over, adult: Secondary | ICD-10-CM | POA: Diagnosis not present

## 2018-03-15 DIAGNOSIS — E559 Vitamin D deficiency, unspecified: Secondary | ICD-10-CM

## 2018-03-15 DIAGNOSIS — R69 Illness, unspecified: Secondary | ICD-10-CM | POA: Diagnosis not present

## 2018-03-15 DIAGNOSIS — R7303 Prediabetes: Secondary | ICD-10-CM | POA: Diagnosis not present

## 2018-03-15 MED ORDER — METFORMIN HCL 500 MG PO TABS: 500.0000 mg | ORAL_TABLET | Freq: Every day | ORAL | 0 refills | Status: DC

## 2018-03-15 NOTE — Progress Notes (Signed)
Office: [phone]  /  Fax: [phone]   HPI:   Chief Complaint: OBESITY Caitlyn [name] is here to discuss her progress with her obesity treatment plan. She is on the Category 2 plan and is following her eating plan approximately 50 % of the time. She states she is walking 30 to 45 minutes 3 times per week. Caitlyn [name] is struggling with the weight. She has been on the Category 2 plan, but she does not want to eat the bread. She has increased her water intake. Her weight is 262 lb (118.8 kg) today and has had a weight loss of 2 pounds over a period of 3 weeks since her last visit. She has lost 5 lbs since starting treatment with us.  Pre-Diabetes Caitlyn [name] has a diagnosis of prediabetes based on her elevated Hgb A1c of 5.7 and insulin level of 27.7 and was informed this puts her at greater risk of developing diabetes. She is taking metformin currently and continues to work on diet and exercise to decrease risk of diabetes. She denies hypoglycemia.  Vitamin D deficiency Caitlyn [name] has a diagnosis of vitamin D deficiency. She is currently taking high dose prescription vit D and denies nausea, vomiting or muscle weakness.  ASSESSMENT AND PLAN:  Prediabetes - Plan: metFORMIN (GLUCOPHAGE) 500 MG tablet  Vitamin D deficiency  Class 3 severe obesity with serious comorbidity and body mass index (BMI) of 40.0 to 44.9 in adult, unspecified obesity type [facility](HCC)  PLAN:  Pre-Diabetes Caitlyn [name] will continue to work on weight loss, exercise, and decreasing simple carbohydrates in her diet to help decrease the risk of diabetes. We dicussed metformin including benefits and risks. She was informed that eating too many simple carbohydrates or too many calories at one sitting increases the likelihood of GI side effects. Caitlyn [name] agreed to continue metformin for now and a prescription was written today for 1 month refill. Caitlyn [name] agreed to follow up with us as directed to monitor her progress.  Vitamin D  Deficiency Caitlyn [name] was informed that low vitamin D levels contributes to fatigue and are associated with obesity, breast, and colon cancer. She agrees to continue to take prescription Vit D @50 ,000 IU every week and will follow up for routine testing of vitamin D, at least 2-3 times per year. She was informed of the risk of over-replacement of vitamin D and agrees to not increase her dose unless she discusses this with us first.  Obesity Caitlyn [name] is currently in the action stage of change. As such, her goal is to continue with weight loss efforts She has agreed to follow the Category 2 plan Caitlyn [name] has been instructed to work up to a goal of 150 minutes of combined cardio and strengthening exercise per week for weight loss and overall health benefits. We discussed the following Behavioral Modification Strategies today: increase H2O intake, no skipping meals, increasing lean protein intake, decreasing simple carbohydrates, increasing vegetables, decrease eating out and work on meal planning and easy cooking plans We will give Caitlyn [name] the phone number and information for bariatric surgery and we will give the phone number for primary care and nutritionist.  Caitlyn [name] has agreed to follow up with our clinic in 2 weeks. She was informed of the importance of frequent follow up visits to maximize her success with intensive lifestyle modifications for her multiple health conditions.  ALLERGIES: Allergies  Allergen Reactions  . Amlodipine Swelling  . Statins Other (See Comments)    myalgia    MEDICATIONS: Current Outpatient Medications on File Prior to  Visit  Medication Sig Dispense Refill  . acetaminophen (TYLENOL) 500 MG tablet Take 1,000 mg by mouth every 6 (six) hours as needed for moderate pain.    Marland Kitchen albuterol (PROVENTIL HFA;VENTOLIN HFA) 108 (90 BASE) MCG/ACT inhaler Inhale 2 puffs into the lungs every 6 (six) hours as needed for wheezing or shortness of breath.     . allopurinol (ZYLOPRIM) 100  MG tablet Take 1 tablet (100 mg total) by mouth daily. (Patient taking differently: Take 300 mg by mouth daily. ) 90 tablet 0  . aspirin 81 MG tablet Take 81 mg by mouth daily. Reported on 04/04/2015    . atorvastatin (LIPITOR) 20 MG tablet Take 20 mg by mouth daily.     . Azilsartan-Chlorthalidone (EDARBYCLOR) 40-12.5 MG TABS Take by mouth.    . carvedilol (COREG) 3.125 MG tablet Take 3.125 mg by mouth 2 (two) times daily with a meal.    . clonazePAM (KLONOPIN) 1 MG tablet Take 1 mg by mouth at bedtime.     . clopidogrel (PLAVIX) 75 MG tablet   2  . colchicine (COLCRYS) 0.6 MG tablet Take 0.6 mg by mouth daily as needed (for flare ups). Flare ups    . EDARBI 40 MG TABS   2  . furosemide (LASIX) 40 MG tablet Take 40 mg by mouth.    . levothyroxine (SYNTHROID, LEVOTHROID) 125 MCG tablet Take 125 mcg by mouth daily before breakfast.    . methocarbamol (ROBAXIN) 500 MG tablet Take 500 mg by mouth every 8 (eight) hours as needed for muscle spasms.     . Pediatric Multivit-Minerals-C (GUMMI BEAR MULTIVITAMIN/MIN) CHEW Chew 2 tablets by mouth daily.    . Vitamin D, Ergocalciferol, (DRISDOL) 50000 units CAPS capsule Take 50,000 Units by mouth every Monday.     No current facility-administered medications on file prior to visit.     PAST MEDICAL HISTORY: Past Medical History:  Diagnosis Date  . Anxiety   . Arthritis    "legs" (01/07/2016)  . Back pain   . CAD 06/2009   a.  s/p MI 4/11: tx with DES x 2 to RCA;  b.  LHC 03/17/11: LAD 30-40%, distal LAD 30-40%, OM2 40-50%, RCA stent patent, EF greater than 70%.;  c.  Lexiscan Myoview (12/15):  Mild apical thinning, no ischemia, EF 61%; normal study  . CAROTID STENOSIS    carotid Dopplers 03/25/11: RICA 60-79%; LICA 0-39%.  Follow up recommended in 6 months.  . Constipation   . Depression   . Gout    "on RX for it" (01/07/2016)  . Heart disease   . HYPERLIPIDEMIA   . HYPERTENSION   . HYPOTHYROIDISM    post ablation tx Graves  . Hypothyroidism    . Joint pain   . Myocardial infarction [facility]) 2012   "mini"  . OSA on CPAP   . Osteoarthritis   . Pre-diabetes   . PVD   . Sleep apnea   . TOBACCO ABUSE quit 06/2009  . Varicose veins   . VITAMIN D DEFICIENCY    DEXA 04/2009 normal    PAST SURGICAL HISTORY: Past Surgical History:  Procedure Laterality Date  . CARDIAC CATHETERIZATION N/A 01/07/2016   Procedure: Left Heart Cath and Coronary Angiography;  Surgeon: Rinaldo Cloud, MD;  [facility] INVASIVE CV LAB;  Service: Cardiovascular;  Laterality: N/A;  . CARDIAC CATHETERIZATION N/A 01/07/2016   Procedure: Coronary Stent Intervention;  Surgeon: Rinaldo Cloud, MD;  Location: [facility] INVASIVE CV LAB;  Service: Cardiovascular;  Laterality:  N/A;  . CAROTID STENT Left    bilateral carotid artery disease post stent of left carotid  . CESAREAN SECTION  1985   "twins"  . CORONARY ANGIOPLASTY    . LEFT HEART CATHETERIZATION WITH CORONARY ANGIOGRAM N/A 03/17/2011   Procedure: LEFT HEART CATHETERIZATION WITH CORONARY ANGIOGRAM;  Surgeon: Herby Abrahamhomas D Stuckey, MD;  [facility];  Service: Cardiovascular;  Laterality: N/A;  . PERIPHERAL VASCULAR CATHETERIZATION N/A 04/06/2015   Procedure: Abdominal Aortogram;  Surgeon: Sherren Kernsharles E Fields, MD;  [facility];  Service: Cardiovascular;  Laterality: N/A;  . RADIOACTIVE PLAQUE INSERTION     Graves disease post radioactive treatment complicated hypothyroidism    SOCIAL HISTORY: Social History   Tobacco Use  . Smoking status: Former Smoker    Packs/day: 1.00    Years: 35.00    Pack years: 35.00    Last attempt to quit: 06/30/2009    Years since quitting: 8.7  . Smokeless tobacco: Never Used  Substance Use Topics  . Alcohol use: Yes    Alcohol/week: 2.0 standard drinks    Types: 2 Glasses of wine per week    Comment: occ  . Drug use: No    FAMILY HISTORY: Family History  Problem Relation Age of Onset  . Heart attack Mother   . Stroke Mother   . Cancer Mother   .  Varicose Veins Mother   . Thyroid disease Mother   . Cancer Father   . Heart disease Father   . Coronary artery disease Other   . Hypertension Other   . Hypertension Sister     ROS: Review of Systems  Constitutional: Positive for weight loss.  Gastrointestinal: Negative for nausea and vomiting.  Musculoskeletal:       Negative for muscle weakness  Endo/Heme/Allergies:       Negative for hypoglycemia    PHYSICAL EXAM: Blood pressure 120/84, pulse 61, temperature 97.6 F (36.4 C), temperature source Oral, height 5\' 5"  (1.651 m), weight 262 lb (118.8 kg). Body mass index is 43.6 kg/m. Physical Exam Vitals signs reviewed.  Constitutional:      Appearance: Normal appearance. She is well-developed. She is obese.  Cardiovascular:     Rate and Rhythm: Normal rate.  Pulmonary:     Effort: Pulmonary effort is normal.  Musculoskeletal: Normal range of motion.  Skin:    General: Skin is warm and dry.  Neurological:     Mental Status: She is alert and oriented to person, place, and time.  Psychiatric:        Mood and Affect: Mood normal.        Behavior: Behavior normal.     RECENT LABS AND TESTS: BMET    Component Value Date/Time   NA 138 01/13/2018 1023   K 4.7 01/13/2018 1023   CL 100 01/13/2018 1023   CO2 23 01/13/2018 1023   GLUCOSE 94 01/13/2018 1023   GLUCOSE 99 06/29/2017 0840   BUN 33 (H) 01/13/2018 1023   CREATININE 0.98 01/13/2018 1023   CALCIUM 9.8 01/13/2018 1023   GFRNONAA 61 01/13/2018 1023   GFRAA 71 01/13/2018 1023   Lab Results  Component Value Date   HGBA1C 5.7 (H) 01/13/2018   Lab Results  Component Value Date   INSULIN 27.7 (H) 01/13/2018   CBC    Component Value Date/Time   WBC 10.0 01/08/2016 0333   RBC 4.38 01/08/2016 0333   HGB 13.1 01/08/2016 0333   HCT 39.9 01/08/2016 0333   PLT  217 01/08/2016 0333   MCV 91.1 01/08/2016 0333   MCH 29.9 01/08/2016 0333   MCHC 32.8 01/08/2016 0333   RDW 14.2 01/08/2016 0333   LYMPHSABS 2.6  01/06/2016 1619   MONOABS 0.4 01/06/2016 1619   EOSABS 0.2 01/06/2016 1619   BASOSABS 0.0 01/06/2016 1619   Iron/TIBC/Ferritin/ %Sat No results found for: IRON, TIBC, FERRITIN, IRONPCTSAT Lipid Panel     Component Value Date/Time   CHOL 188 06/29/2017 0840   TRIG 207 (H) 06/29/2017 0840   TRIG 114 02/04/2010   HDL 43 06/29/2017 0840   CHOLHDL 4.4 06/29/2017 0840   VLDL 41 (H) 06/29/2017 0840   LDLCALC 104 (H) 06/29/2017 0840   LDLDIRECT 156.4 08/02/2012 1713   Hepatic Function Panel     Component Value Date/Time   PROT 7.8 01/13/2018 1023   ALBUMIN 4.6 01/13/2018 1023   AST 23 01/13/2018 1023   ALT 16 01/13/2018 1023   ALKPHOS 106 01/13/2018 1023   BILITOT 0.2 01/13/2018 1023   BILIDIR <0.1 (L) 06/29/2017 0840   IBILI NOT CALCULATED 06/29/2017 0840      Component Value Date/Time   TSH 3.000 01/13/2018 1023   TSH 2.636 05/25/2015 0930   TSH 2.23 02/24/2014 1553   TSH 2.52 10/19/2013 1641   Results for NEIDRA, GIRVAN (MRN [id number]) as of 03/15/2018 13:48  Ref. Range 01/13/2018 10:23  Vitamin D, 25-Hydroxy Latest Ref Range: 30.0 - 100.0 ng/mL 29.4 (L)     OBESITY BEHAVIORAL INTERVENTION VISIT  Today's visit was # 5   Starting weight: 269 lbs Starting date: 01/13/2018 Today's weight : 264 lbs Today's date: 03/15/2018 Total lbs lost to date: 5 At least 15 minutes were spent on discussing the following behavioral intervention visit.   ASK: We discussed the diagnosis of obesity with Caitlyn [name] today and Caitlyn [name] agreed to give [place] permission to discuss obesity behavioral modification therapy today.  ASSESS: Caitlyn [name] has the diagnosis of obesity and her BMI today is 4.9 Caitlyn [name] is in the action stage of change   ADVISE: Caitlyn [name] was educated on the multiple health risks of obesity as well as the benefit of weight loss to improve her health. She was advised of the need for long term treatment and the importance of lifestyle modifications to improve her  current health and to decrease her risk of future health problems.  AGREE: Multiple dietary modification options and treatment options were discussed and  Caitlyn [name] agreed to follow the recommendations documented in the above note.  ARRANGE: Caitlyn [name] was educated on the importance of frequent visits to treat obesity as outlined per CMS and USPSTF guidelines and agreed to schedule her next follow up appointment today.  Cristi Loron, am acting as Energy manager for [employer]. Manson Passey, DO   I have reviewed the above documentation for accuracy and completeness, and I agree with the above. -Corinna Capra, DO

## 2018-03-25 DIAGNOSIS — I10 Essential (primary) hypertension: Secondary | ICD-10-CM | POA: Diagnosis not present

## 2018-03-25 DIAGNOSIS — E785 Hyperlipidemia, unspecified: Secondary | ICD-10-CM | POA: Diagnosis not present

## 2018-03-25 DIAGNOSIS — E039 Hypothyroidism, unspecified: Secondary | ICD-10-CM | POA: Diagnosis not present

## 2018-03-25 DIAGNOSIS — M1990 Unspecified osteoarthritis, unspecified site: Secondary | ICD-10-CM | POA: Diagnosis not present

## 2018-03-25 DIAGNOSIS — I25118 Atherosclerotic heart disease of native coronary artery with other forms of angina pectoris: Secondary | ICD-10-CM | POA: Diagnosis not present

## 2018-03-25 DIAGNOSIS — I252 Old myocardial infarction: Secondary | ICD-10-CM | POA: Diagnosis not present

## 2018-03-29 ENCOUNTER — Ambulatory Visit (INDEPENDENT_AMBULATORY_CARE_PROVIDER_SITE_OTHER): Payer: Medicare HMO | Admitting: Bariatrics

## 2018-03-29 ENCOUNTER — Encounter (INDEPENDENT_AMBULATORY_CARE_PROVIDER_SITE_OTHER): Payer: Self-pay

## 2018-03-29 DIAGNOSIS — H5203 Hypermetropia, bilateral: Secondary | ICD-10-CM | POA: Diagnosis not present

## 2018-03-30 ENCOUNTER — Encounter (INDEPENDENT_AMBULATORY_CARE_PROVIDER_SITE_OTHER): Payer: Self-pay | Admitting: Dietician

## 2018-03-30 ENCOUNTER — Ambulatory Visit (INDEPENDENT_AMBULATORY_CARE_PROVIDER_SITE_OTHER): Payer: Medicare HMO | Admitting: Dietician

## 2018-03-30 VITALS — Ht 65.0 in | Wt 264.0 lb

## 2018-03-30 DIAGNOSIS — Z6841 Body Mass Index (BMI) 40.0 and over, adult: Principal | ICD-10-CM

## 2018-03-30 DIAGNOSIS — Z9189 Other specified personal risk factors, not elsewhere classified: Secondary | ICD-10-CM

## 2018-03-30 DIAGNOSIS — R7303 Prediabetes: Secondary | ICD-10-CM

## 2018-03-30 DIAGNOSIS — E669 Obesity, unspecified: Secondary | ICD-10-CM | POA: Diagnosis not present

## 2018-03-30 DIAGNOSIS — Z6834 Body mass index (BMI) 34.0-34.9, adult: Secondary | ICD-10-CM | POA: Diagnosis not present

## 2018-03-30 NOTE — Progress Notes (Signed)
Office: [phone]  /  Fax: [phone]     Caitlyn [name] has a diagnosis of prediabetes based on her elevated HgA1c and was informed this puts her at greater risk of developing diabetes. She is here today for diabetes risk nutrition counseling which includes her obesity treatment plan,   Caitlyn [name] weight today is 264 lbs. She has maintained her weight since her last visit however appears to be retaining some fluid. She has had a weight loss of 5 lbs since beginning treatment with [place].  She continues on the Category 2 meal plan however has been deviating significantly. She is frustrated today with her lack of progress. Per her diet recall she has not been eating the lean protein at all on her meal plan and states she is using beans for the protein. 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 She admits she does "taste" the foods before she sends them out. Based on her RMR of 1505 the additional food intake likely stalling her weight loss as well as decreased lean protein intake and increased carbohydrate intake. She also admits she eats out several times per week but is trying to make healthier choices.   Patient was reducated about food nutrients ie protein, fats, simple and complex carbohydrates and how these affect insulin response and her weight.  Focus on portion control,  avoiding simple carbohydrates and increasing her lean protein intake for ongoing wt loss efforts and glucose management. Reviewed in detail the Category 2 meal plan and she agreed to follow more closely until her next visit as well as keep a record of any foods "tasted". Discussed better options for eating out however informed patient that eating out more often may also slow weight loss.   Caitlyn [name] is on the following meal plan: category 2. Keep record of additional food intake from catering.  Her meal plan was individualized for maximum benefit.  Also discussed at length the following  behavioral modifications to help maximize success:  increasing lean protein intake, decreasing simple carbohydrates,  increase water intake, and healthier eating out.   Caitlyn [name] has been instructed to work up to a goal of 150 minutes of combined cardio and strengthening exercise per week for weight loss and overall health benefits. Written information was provided and the following handouts were given: category 2, eating out.     OBESITY BEHAVIORAL INTERVENTION VISIT  Today's visit was # 6   Starting weight: 269 lbs  Starting date: 01/13/2018 Today's weight : Weight: 264 lb (119.7 kg)  Today's date: 03/30/2018 Total lbs lost to date: 5 lbs At least 15 minutes were spent on discussing the following behavioral intervention visit.   ASK: We discussed the diagnosis of obesity with Caitlyn [name] today and Caitlyn [name] agreed to give [place] permission to discuss obesity behavioral modification therapy today.  ASSESS: Caitlyn [name] has the diagnosis of obesity and her BMI today is 28 Caitlyn [name] is in the action stage of change   ADVISE: Caitlyn [name] was educated on the multiple health risks of obesity as well as the benefit of weight loss to improve her health. She was advised of the need for long term treatment and the importance of lifestyle modifications to improve her current health and to decrease her risk of future health problems.  AGREE: Multiple dietary modification options and treatment options were discussed and  Caitlyn [name] agreed to follow the recommendations documented in the above note.  ARRANGE: Caitlyn [name] was educated on the importance of  frequent visits to treat obesity as outlined per CMS and USPSTF guidelines and agreed to schedule her next follow up appointment today.

## 2018-03-31 DIAGNOSIS — H524 Presbyopia: Secondary | ICD-10-CM | POA: Diagnosis not present

## 2018-04-06 DIAGNOSIS — M5442 Lumbago with sciatica, left side: Secondary | ICD-10-CM | POA: Diagnosis not present

## 2018-04-06 DIAGNOSIS — M545 Low back pain: Secondary | ICD-10-CM | POA: Diagnosis not present

## 2018-04-12 ENCOUNTER — Ambulatory Visit (INDEPENDENT_AMBULATORY_CARE_PROVIDER_SITE_OTHER): Payer: Medicare HMO | Admitting: Bariatrics

## 2018-04-12 ENCOUNTER — Encounter (INDEPENDENT_AMBULATORY_CARE_PROVIDER_SITE_OTHER): Payer: Self-pay

## 2018-04-22 ENCOUNTER — Other Ambulatory Visit (INDEPENDENT_AMBULATORY_CARE_PROVIDER_SITE_OTHER): Payer: Self-pay | Admitting: Bariatrics

## 2018-04-22 DIAGNOSIS — R7303 Prediabetes: Secondary | ICD-10-CM

## 2018-05-09 ENCOUNTER — Other Ambulatory Visit (INDEPENDENT_AMBULATORY_CARE_PROVIDER_SITE_OTHER): Payer: Self-pay | Admitting: Bariatrics

## 2018-05-09 DIAGNOSIS — R7303 Prediabetes: Secondary | ICD-10-CM

## 2018-05-11 ENCOUNTER — Telehealth: Payer: Self-pay

## 2018-05-11 ENCOUNTER — Other Ambulatory Visit (HOSPITAL_COMMUNITY): Payer: Self-pay | Admitting: Cardiology

## 2018-05-11 ENCOUNTER — Other Ambulatory Visit: Payer: Self-pay | Admitting: Cardiology

## 2018-05-11 DIAGNOSIS — M1990 Unspecified osteoarthritis, unspecified site: Secondary | ICD-10-CM | POA: Diagnosis not present

## 2018-05-11 DIAGNOSIS — I25110 Atherosclerotic heart disease of native coronary artery with unstable angina pectoris: Secondary | ICD-10-CM | POA: Diagnosis not present

## 2018-05-11 DIAGNOSIS — R079 Chest pain, unspecified: Secondary | ICD-10-CM

## 2018-05-11 DIAGNOSIS — I252 Old myocardial infarction: Secondary | ICD-10-CM | POA: Diagnosis not present

## 2018-05-11 DIAGNOSIS — E785 Hyperlipidemia, unspecified: Secondary | ICD-10-CM | POA: Diagnosis not present

## 2018-05-11 DIAGNOSIS — I10 Essential (primary) hypertension: Secondary | ICD-10-CM | POA: Diagnosis not present

## 2018-05-11 DIAGNOSIS — E039 Hypothyroidism, unspecified: Secondary | ICD-10-CM | POA: Diagnosis not present

## 2018-05-11 NOTE — Telephone Encounter (Signed)
msg sent to provider 

## 2018-05-11 NOTE — Telephone Encounter (Signed)
Copied from CRM [id number]. Topic: Appointment Scheduling - Scheduling Inquiry for Clinic >> May 11, 2018  3:35 PM Lynne Logan D wrote: Reason for CRM: Pt stated Dr. Alferd Apa assistant confirmed that Dr. Lorin Picket would take pt on as new pt. Please advise.

## 2018-05-19 ENCOUNTER — Ambulatory Visit (HOSPITAL_COMMUNITY): Payer: BLUE CROSS/BLUE SHIELD

## 2018-05-24 ENCOUNTER — Other Ambulatory Visit (INDEPENDENT_AMBULATORY_CARE_PROVIDER_SITE_OTHER): Payer: Self-pay | Admitting: Bariatrics

## 2018-05-24 DIAGNOSIS — R7303 Prediabetes: Secondary | ICD-10-CM

## 2018-05-24 NOTE — Telephone Encounter (Signed)
I don't have a response from Dr. Lorin Picket

## 2018-05-24 NOTE — Telephone Encounter (Signed)
Caitlyn [name] with Dr. Annitta Jersey office calling to set this pt up a new pt appt with Dr. Lorin Picket. States she received approval. Please advise.

## 2018-05-26 ENCOUNTER — Encounter: Payer: Self-pay | Admitting: Nurse Practitioner

## 2018-05-26 ENCOUNTER — Other Ambulatory Visit: Payer: Self-pay

## 2018-05-26 ENCOUNTER — Ambulatory Visit (INDEPENDENT_AMBULATORY_CARE_PROVIDER_SITE_OTHER): Payer: Medicare HMO | Admitting: Nurse Practitioner

## 2018-05-26 VITALS — BP 131/81 | HR 74 | Wt 173.0 lb

## 2018-05-26 DIAGNOSIS — B373 Candidiasis of vulva and vagina: Secondary | ICD-10-CM | POA: Diagnosis not present

## 2018-05-26 DIAGNOSIS — R69 Illness, unspecified: Secondary | ICD-10-CM | POA: Diagnosis not present

## 2018-05-26 DIAGNOSIS — B9689 Other specified bacterial agents as the cause of diseases classified elsewhere: Secondary | ICD-10-CM | POA: Diagnosis not present

## 2018-05-26 DIAGNOSIS — N760 Acute vaginitis: Secondary | ICD-10-CM

## 2018-05-26 DIAGNOSIS — N898 Other specified noninflammatory disorders of vagina: Secondary | ICD-10-CM | POA: Diagnosis not present

## 2018-05-26 DIAGNOSIS — B3731 Acute candidiasis of vulva and vagina: Secondary | ICD-10-CM

## 2018-05-26 DIAGNOSIS — Z113 Encounter for screening for infections with a predominantly sexual mode of transmission: Secondary | ICD-10-CM

## 2018-05-26 MED ORDER — TERCONAZOLE 0.4 % VA CREA: 1.0000 | TOPICAL_CREAM | Freq: Every day | VAGINAL | 0 refills | Status: DC

## 2018-05-26 MED ORDER — FLUCONAZOLE 150 MG PO TABS
150.0000 mg | ORAL_TABLET | Freq: Every day | ORAL | 0 refills | Status: DC
Start: 2018-05-26 — End: 2019-10-27

## 2018-05-26 NOTE — Progress Notes (Signed)
GYNECOLOGY OFFICE VISIT NOTE   History:  68 y.o. G2P2 here today for vaginal itching she has had for one week.  She had an initial scratch from a fingernail where she had some bleeding when wiping.  Then she applied Triamcinalone cream and it did not help so she tried a vaginal cream insert for a one time use and the itching still persisted.  Comes today as the itching and burning with urination are still bothersome.  Reports no sexual activity and no menses - menopausal. She denies any abnormal vaginal discharge, bleeding, pelvic pain or other concerns.   Has a cardiologist that she sees regularly and recently received her Medicare and she is looking to establish with a PCP as well.    Client came to the walk in clinic today for a specific problem so we addressed the single problem that she had - not an annual exam.  Currently up to date on pap and mammogram but has other health maintenance items that are outstanding and would be addressed at an annual exam..  Past Medical History:  Diagnosis Date  . Anxiety   . Arthritis    "legs" (01/07/2016)  . Back pain   . CAD 06/2009   a.  s/p MI 4/11: tx with DES x 2 to RCA;  b.  LHC 03/17/11: LAD 30-40%, distal LAD 30-40%, OM2 40-50%, RCA stent patent, EF greater than 70%.;  c.  Lexiscan Myoview (12/15):  Mild apical thinning, no ischemia, EF 61%; normal study  . CAROTID STENOSIS    carotid Dopplers 03/25/11: RICA 60-79%; LICA 0-39%.  Follow up recommended in 6 months.  . Constipation   . Depression   . Gout    "on RX for it" (01/07/2016)  . Heart disease   . HYPERLIPIDEMIA   . HYPERTENSION   . HYPOTHYROIDISM    post ablation tx Graves  . Hypothyroidism   . Joint pain   . Myocardial infarction [facility]) 2012   "mini"  . OSA on CPAP   . Osteoarthritis   . Pre-diabetes   . PVD   . Sleep apnea   . TOBACCO ABUSE quit 06/2009  . Varicose veins   . VITAMIN D DEFICIENCY    DEXA 04/2009 normal    Past Surgical History:  Procedure Laterality  Date  . CARDIAC CATHETERIZATION N/A 01/07/2016   Procedure: Left Heart Cath and Coronary Angiography;  Surgeon: Rinaldo Cloud, MD;  Location[facility] INVASIVE CV LAB;  Service: Cardiovascular;  Laterality: N/A;  . CARDIAC CATHETERIZATION N/A 01/07/2016   Procedure: Coronary Stent Intervention;  Surgeon: Rinaldo Cloud, MD;  Location: [facility] INVASIVE CV LAB;  Service: Cardiovascular;  Laterality: N/A;  . CAROTID STENT Left    bilateral carotid artery disease post stent of left carotid  . CESAREAN SECTION  1985   "twins"  . CORONARY ANGIOPLASTY    . LEFT HEART CATHETERIZATION WITH CORONARY ANGIOGRAM N/A 03/17/2011   Procedure: LEFT HEART CATHETERIZATION WITH CORONARY ANGIOGRAM;  Surgeon: Herby Abraham, MD;  Location: [facility] CATH LAB;  Service: Cardiovascular;  Laterality: N/A;  . PERIPHERAL VASCULAR CATHETERIZATION N/A 04/06/2015   Procedure: Abdominal Aortogram;  Surgeon: Sherren Kerns, MD;  Location: [facility] INVASIVE CV LAB;  Service: Cardiovascular;  Laterality: N/A;  . RADIOACTIVE PLAQUE INSERTION     Graves disease post radioactive treatment complicated hypothyroidism    The following portions of the patient's history were reviewed and updated as appropriate: allergies, current medications, past family history, past medical history, past social history, past  surgical history and problem list.   Health Maintenance:  Normal pap and negative HRHPV on 04-11-16.  Normal mammogram on 10-14-17.   Review of Systems:  Pertinent items noted in HPI and remainder of comprehensive ROS otherwise negative.  Objective:  Physical Exam BP 131/81   Pulse 74   Wt 173 lb (78.5 kg)   BMI 28.79 kg/m  CONSTITUTIONAL: Well-developed, well-nourished female in no acute distress.  HENT:  Normocephalic, atraumatic. EYES: Conjunctivae and EOM are normal. Pupils are equal, round.  No scleral icterus.  NECK: Normal range of motion, supple, no masses SKIN: Skin is warm and dry. No rash noted. Not diaphoretic. No erythema. No  pallor. NEUROLOGIC: Alert and oriented to person, place, and time. Normal muscle tone coordination. No cranial nerve deficit noted. PSYCHIATRIC: Normal mood and affect. Normal behavior. Normal judgment and thought content. PELVIC: no lesions seen on external genitalia, no edema, no skin discoloration, small amount of yellow vaginal discharge noted.  No erythema.  Bimanual deferred. MUSCULOSKELETAL: Normal range of motion.   Labs and Imaging No results found.  Assessment & Plan:  1. Yeast infection involving the vagina and surrounding area Client wants to try Diflucan and Terazol cream as she may have a yeast infection.  Advised not to use Diflucan with her gout medication. Advised to return if the problem does not resolve - alternatively she could have a UTI, vaginal atrophy, or an allergic reaction to some of the creams.  Will start with treating for a yeast infection but may need to look at other causes    Routine preventative health maintenance measures emphasized. Please refer to After Visit Summary for other counseling recommendations.   Return if symptoms worsen or fail to improve.   Total face-to-face time with patient: 20 minutes.  Over 50% of encounter was spent on counseling and coordination of care.  Nolene Bernheim, RN, MSN, NP-BC Nurse Practitioner, [facility] for [employer], [facility] 05/26/2018 6:29 PM

## 2018-05-28 LAB — CERVICOVAGINAL ANCILLARY ONLY
Bacterial vaginitis: POSITIVE — AB
CHLAMYDIA, DNA PROBE: NEGATIVE
Candida vaginitis: NEGATIVE
NEISSERIA GONORRHEA: NEGATIVE
Trichomonas: NEGATIVE

## 2018-06-01 ENCOUNTER — Telehealth: Payer: Self-pay | Admitting: General Practice

## 2018-06-01 MED ORDER — METRONIDAZOLE 0.75 % VA GEL: 1.0000 | Freq: Every day | VAGINAL | 0 refills | Status: AC

## 2018-06-01 NOTE — Progress Notes (Addendum)
Lab results show BV.  MyChart message sent to client and Metrogel (due to the many other oral meds client takes - perhaps more local treatment would work better for her) sent to her pharmacy.    Nolene Bernheim, RN, MSN, NP-BC Nurse Practitioner, [facility] for [employer], [facility] 06/01/2018 8:01 AM

## 2018-06-01 NOTE — Telephone Encounter (Signed)
Patient called & left message on nurse voicemail line requesting pap smear results because she got a call from her pharmacy that a cream was there to be picked up. Called patient and informed her of BV & Rx at pharmacy. Patient verbalized understanding.

## 2018-06-01 NOTE — Addendum Note (Signed)
Addended by: Currie Paris on: 06/01/2018 08:03 AM   Modules accepted: Orders

## 2018-06-02 ENCOUNTER — Encounter (HOSPITAL_COMMUNITY): Payer: Self-pay

## 2018-06-02 ENCOUNTER — Ambulatory Visit (HOSPITAL_COMMUNITY): Payer: Medicare HMO

## 2018-06-16 ENCOUNTER — Encounter (INDEPENDENT_AMBULATORY_CARE_PROVIDER_SITE_OTHER): Payer: Self-pay

## 2018-07-29 ENCOUNTER — Encounter (HOSPITAL_COMMUNITY): Payer: BLUE CROSS/BLUE SHIELD

## 2018-07-29 ENCOUNTER — Ambulatory Visit: Payer: BLUE CROSS/BLUE SHIELD | Admitting: Family

## 2018-08-06 ENCOUNTER — Other Ambulatory Visit: Payer: Self-pay | Admitting: *Deleted

## 2018-08-06 NOTE — Patient Outreach (Signed)
[facility]) Care Management  08/06/2018  Caitlyn [name] 1950-06-14 [id number]   Telephone Screen  Referral Date: 08/06/18 Referral Source: self referral  Referral Reason: 08/06/18 at 1349 spoke with pt re: Medication adherence with metformin, which she verbalized that she is taking as prescribed, however states that she does not have a designated pcp and would like one. Please provide telephonic outreach to pt re: pcp assignment Thank you - Drema Balzarine RN, 08/05/08     Outreach #1 Successful at the home number  Patient is able to verify HIPAA Reviewed and addressed  Self referral to [facility] with patient She states she can not recall when she has been seen last by a primary care provider   Caitlyn [name] reports she is covered by [facility] UNTIL August 16 2018 She reports then she will be covered by [employer] preferred Plus HMO effective August 16 2018  She reports Dr Rinaldo Cloud is her cardiologist.  Dr Sharyn Lull is listed in Epic on today as her PCP Caitlyn [name] confirms Dr Sharyn Lull informed her he would see and work with her until she found a PCP (primary care provider) He has been working with her since October 2017 hospitalization  [facility] RN CM discussed assisting her to locate a list of INN (in network) [employer] Preferred Plus HMO providers. Discussed the importance of her choosing INN providers. She voiced understanding.   Caitlyn [name] initially wanted to find a list of internal medicine provider and have them emailed to her but changed to wanting to find a list of internal medicine, endocrinologists, OB GYN and cardiologists. She reported wanting to find providers for second opinions and not wanting certain MDs she was already aware of.  [facility] RN CM made her aware that [facility] RN CM would not recommend providers but allow her to have a list of providers so that she could make a quality choice. She voiced understanding   At that time that her request became more  detailed, she agree to allow [facility] RN CM to instruct her how to go to [email or web address] web site to find these list of providers on her home laptop.  [facility] RN CM completed this task and she was able to view and filter providers to assist with her choice. She states she will contact the providers of her choice on next week to schedule appointment after August 16 2018.    When further screening Caitlyn [name], she informs [name] primary goals are to lose weight and manage her medical diagnoses better. She discusses having extreme trouble losing weight despite her efforts. She voices concern with fluid retention and discussed the related to her sister extensive medical conditions. She had diabetes risk nutrition counseling which includes her obesity treatment plan 03/30/2018 and has not decided on gastric surgery   CM discussed [facility] services for [facility] health coach, [facility] community RN CM, [facility] pharmacy, [facility] SW and [facility] NP.    Social: Caitlyn [name] is a divorced female who is independent in all her care needs. She reports she "went through a bad divorce" She reports having support of her 2 daughters, her sister, her church members, her pastor and her  2 grandchildren.    She reports depression related to taking medications,not losing weight. She denies wanting to hurt herself. She had this discussion with Dr Sharyn Lull Today's PHQ-2 =2 PHQ-9 = 4    Conditions: HTN, coronary atherosclerosis,  carotid stenosis, cardiac stent since 12/2015,   HLD, hypothyroidism, aphthous ulcers,  hx of UTI, vitamin D deficiency, depression,  hx 2 MI 2010, 2016, PVD, graves disease 1980, hx of Class 3 severe obesity with serious comorbidity and BMI of 40-44.9, prediabetes (last HgA1c ws on 01/13/18 at 5.7), upper airway cough syndrome, gout, edema, tobacco abuse, OSA, back pain, varicose veins   DME:  Medications: She denies concerns affording medications, side effects of medications and questions about medications She reports issues with taking  medications as prescribed related to her    Appointments: See Dr Sharyn LullHarwani during the week of May 26-29 2020   She states she can not recall when she has been seen last by a primary care provider  Advance Directives: She denies need for assist with advance directives    Consent: [facility] RN CM [facility]. Patient gave verbal consent for services [facility] CM.   Plan: [facility] CM will f/u with Caitlyn [name] within 14 to 21 business days to follow up with her primary care provide selection, appointment and to assess further   Pt encouraged to return a call to [facility] RN CM prn  [facility] RN CM sent a successful outreach letter as discussed with [facility] brochure enclosed for review  Routed note to Dr Vivia EwingHarwani   L. Noelle PennerGibbs, RN, BSN, CCM [facility] Telephonic Care Management Care Coordinator Office number [phone] Mobile number [phone]  Main [facility] number [phone] Fax number [phone]

## 2018-08-10 DIAGNOSIS — I10 Essential (primary) hypertension: Secondary | ICD-10-CM | POA: Diagnosis not present

## 2018-08-10 DIAGNOSIS — I25118 Atherosclerotic heart disease of native coronary artery with other forms of angina pectoris: Secondary | ICD-10-CM | POA: Diagnosis not present

## 2018-08-10 DIAGNOSIS — M1990 Unspecified osteoarthritis, unspecified site: Secondary | ICD-10-CM | POA: Diagnosis not present

## 2018-08-10 DIAGNOSIS — E785 Hyperlipidemia, unspecified: Secondary | ICD-10-CM | POA: Diagnosis not present

## 2018-08-10 DIAGNOSIS — I252 Old myocardial infarction: Secondary | ICD-10-CM | POA: Diagnosis not present

## 2018-08-11 DIAGNOSIS — I10 Essential (primary) hypertension: Secondary | ICD-10-CM | POA: Diagnosis not present

## 2018-08-11 DIAGNOSIS — E559 Vitamin D deficiency, unspecified: Secondary | ICD-10-CM | POA: Diagnosis not present

## 2018-08-11 DIAGNOSIS — Z1159 Encounter for screening for other viral diseases: Secondary | ICD-10-CM | POA: Diagnosis not present

## 2018-08-11 DIAGNOSIS — R9431 Abnormal electrocardiogram [ECG] [EKG]: Secondary | ICD-10-CM | POA: Diagnosis not present

## 2018-08-11 DIAGNOSIS — Z0000 Encounter for general adult medical examination without abnormal findings: Secondary | ICD-10-CM | POA: Diagnosis not present

## 2018-08-11 DIAGNOSIS — E7800 Pure hypercholesterolemia, unspecified: Secondary | ICD-10-CM | POA: Diagnosis not present

## 2018-08-11 DIAGNOSIS — Z79899 Other long term (current) drug therapy: Secondary | ICD-10-CM | POA: Diagnosis not present

## 2018-08-11 DIAGNOSIS — Z131 Encounter for screening for diabetes mellitus: Secondary | ICD-10-CM | POA: Diagnosis not present

## 2018-08-11 DIAGNOSIS — R5383 Other fatigue: Secondary | ICD-10-CM | POA: Diagnosis not present

## 2018-08-12 ENCOUNTER — Other Ambulatory Visit: Payer: Self-pay | Admitting: Internal Medicine

## 2018-08-12 DIAGNOSIS — N6489 Other specified disorders of breast: Secondary | ICD-10-CM

## 2018-08-12 DIAGNOSIS — Z1231 Encounter for screening mammogram for malignant neoplasm of breast: Secondary | ICD-10-CM

## 2018-08-20 ENCOUNTER — Other Ambulatory Visit: Payer: Self-pay

## 2018-08-20 ENCOUNTER — Other Ambulatory Visit: Payer: Self-pay | Admitting: *Deleted

## 2018-08-20 ENCOUNTER — Ambulatory Visit: Payer: Self-pay | Admitting: *Deleted

## 2018-08-20 NOTE — Patient Outreach (Signed)
[facility]) Care Management  08/20/2018  Caitlyn [name] November 03, 1950 [id number]   Care coordination (f/u Primary care MD)   Returned a call to patient as requested  She continues to be unable to speak with [facility] RN CM  She states she will return a call to [facility] RN CM   Plan [facility] RN CM will speak with Mrs Curl if she returns a call  [facility] RN CM consulted by [facility] CMA C Fayrene Fearing about concern with [name] coverage. [name] coverage has been noted to be [employer] vs Monia Pouch effective August 16 2018 as she discussed on the first day of contact. [facility] services are not provided to [employer] covered members at this time. This will be explained to [name] during the next contact  [facility] RN CM will also pend a call within the next 4 business days if no return call from [name] today   [name], RN, BSN, CCM [facility] Telephonic Care Management Care Coordinator Office number [phone] Mobile number [phone]  Main [facility] number [phone] Fax number [phone]

## 2018-08-20 NOTE — Patient Outreach (Signed)
[facility]) Care Management  08/20/2018  Caitlyn [name] 06/21/1950 [id number]   Care coordination- f/u primary care MD services, coverage  Patient returned a call to [facility] RN CM Patient is able to verify HIPAA [facility] RN CM f/u on pcp services She did see Dr Salli Real on 08/12/18 and had various tests. She was pleased with the services. She is scheduled go f/u with Dr Wynelle Link on Wednesday 08/26/18    Caitlyn [name] voices that during that visit she wants to have all her medications reviewed, to discussed her wt gain issues even with the compliance with a carbohydrate free diet, and she is interested in getting off some of the medications.  [facility] RN CM provided encouragement with her collaboration with Dr Wynelle Link   [facility] RN CM discussed with her that she is no longer eligible for [facility] services related to her change of coverage effective 08/16/18 with Caitlyn [name] (she was with Caitlyn [name] when she was initially referred to [facility] services)  She voiced understanding  : Consent: [facility] RN CM reviewed [facility] services with patient. Patient gave verbal consent for services.  Plan  [facility] RN CM will contact Caitlyn [name] within the next 4 business days to complete a warm transfer to some possible Caitlyn [name] programs   Routed note to Dr Ezra Sites L. Noelle Penner, RN, BSN, CCM [facility] Telephonic Care Management Care Coordinator Office number [phone] Mobile number [phone]  Main [facility] number [phone] Fax number [phone]

## 2018-08-20 NOTE — Patient Outreach (Addendum)
  [facility]) Care Management  08/20/2018  CRESSIE TINES [birth year] [id number]   Care coordination (f/u Primary care MD)    Patient is able to verify HIPAA Reviewed the purpose for the call to f/u with her about finding a primary MD Presently she is unable to speak and requests a return call after 4 pm She does confirm she has seen Dr Salli Real [facility] RN CM notes from Epic that she is scheduled 10/20/18 for an ultrasound and mammogram that was ordered by Dr Wynelle Link.   Consent: [facility] RN CM reviewed [facility] services with patient. Patient gave verbal consent for services.  Plan [facility] RN CM will attempt to call her later on today or within 4 business days  Shaunte Weissinger L. Noelle Penner, RN, BSN, CCM [facility] Telephonic Care Management Care Coordinator Office number [phone] Mobile number [phone]  Main [facility] number [phone] Fax number [phone]

## 2018-08-23 ENCOUNTER — Other Ambulatory Visit: Payer: Self-pay

## 2018-08-23 ENCOUNTER — Other Ambulatory Visit: Payer: Self-pay | Admitting: *Deleted

## 2018-08-23 NOTE — Patient Outreach (Signed)
[facility]) Care Management  08/23/2018  YEE GANGI 1950/05/13 [id number]   Care coordination   Patient is able to verify HIPAA Reviewed the purpose of the call for a warm transfer to [place] with patient  Consent: [facility] RN CM reviewed [facility] services with patient. Patient gave verbal consent for services.  [facility] RN CM reviewed the contact with Donn-e Tamala Julian of [employer] to have a [place] CM contact the patient for services Ms Vari asked questions about getting her medical records transferred to Dr Nancy Fetter office. She and CM discussed her completing a medical release form a Dr Nancy Fetter office on 08/25/18 when she returns to have records from [employer], [place] and Dr Terrence Dupont obtained She voiced understanding   [facility] RN CM sent a letter (after discussing it with her during the call ) to Ms Burt Knack with information discussed about [place] cm and pharmacy resources plus [facility] information programs at [facility] (next August 31 2018 5:45 pm) [facility] RN CM sent a letter to Dr Nancy Fetter informing him of pt case closure, warm transfer to [place] program and pt wanting to continue to work on improving her health/home care management, reconciliation of her medications, decrease in medicines and weight and nutrition management.  Ms Orbie Pyo voiced appreciation of services rendered   Plan Case Closure  The patient is participating in another care management program via [facility] spring. and The patient is no longer eligible for the [facility] program (ineligible insurance change from [place] to [place])  [employer]. Lavina Hamman, RN, BSN, [place] Coordinator Office number [phone] Mobile number [phone]  Main [facility]N number [phone] Fax number [phone]

## 2018-08-31 ENCOUNTER — Other Ambulatory Visit: Payer: Self-pay

## 2018-08-31 ENCOUNTER — Emergency Department (HOSPITAL_COMMUNITY): Payer: Medicare Other

## 2018-08-31 ENCOUNTER — Encounter (HOSPITAL_COMMUNITY): Payer: Self-pay | Admitting: Emergency Medicine

## 2018-08-31 ENCOUNTER — Emergency Department (HOSPITAL_COMMUNITY)
Admission: EM | Admit: 2018-08-31 | Discharge: 2018-08-31 | Disposition: A | Discharge status: Home / Self Care | Payer: Medicare Other | Attending: Emergency Medicine | Admitting: Emergency Medicine

## 2018-08-31 DIAGNOSIS — I2510 Atherosclerotic heart disease of native coronary artery without angina pectoris: Secondary | ICD-10-CM | POA: Diagnosis not present

## 2018-08-31 DIAGNOSIS — R509 Fever, unspecified: Secondary | ICD-10-CM

## 2018-08-31 DIAGNOSIS — Z79899 Other long term (current) drug therapy: Secondary | ICD-10-CM | POA: Insufficient documentation

## 2018-08-31 DIAGNOSIS — E039 Hypothyroidism, unspecified: Secondary | ICD-10-CM | POA: Insufficient documentation

## 2018-08-31 DIAGNOSIS — I10 Essential (primary) hypertension: Secondary | ICD-10-CM | POA: Insufficient documentation

## 2018-08-31 DIAGNOSIS — Z7982 Long term (current) use of aspirin: Secondary | ICD-10-CM | POA: Insufficient documentation

## 2018-08-31 DIAGNOSIS — J069 Acute upper respiratory infection, unspecified: Secondary | ICD-10-CM | POA: Diagnosis not present

## 2018-08-31 DIAGNOSIS — Z87891 Personal history of nicotine dependence: Secondary | ICD-10-CM | POA: Insufficient documentation

## 2018-08-31 DIAGNOSIS — J029 Acute pharyngitis, unspecified: Secondary | ICD-10-CM | POA: Diagnosis present

## 2018-08-31 DIAGNOSIS — U071 COVID-19: Secondary | ICD-10-CM | POA: Diagnosis not present

## 2018-08-31 LAB — BASIC METABOLIC PANEL
Anion gap: 8 (ref 5–15)
BUN: 19 mg/dL (ref 8–23)
CO2: 23 mmol/L (ref 22–32)
Calcium: 9 mg/dL (ref 8.9–10.3)
Chloride: 104 mmol/L (ref 98–111)
Creatinine, Ser: 1.34 mg/dL — ABNORMAL HIGH (ref 0.44–1.00)
GFR calc Af Amer: 48 mL/min — ABNORMAL LOW (ref >60)
GFR calc non Af Amer: 41 mL/min — ABNORMAL LOW (ref >60)
Glucose, Bld: 85 mg/dL (ref 70–99)
Potassium: 4.1 mmol/L (ref 3.5–5.1)
Sodium: 135 mmol/L (ref 135–145)

## 2018-08-31 LAB — CBC
HCT: 40.7 % (ref 36.0–46.0)
Hemoglobin: 13.1 g/dL (ref 12.0–15.0)
MCH: 30.8 pg (ref 26.0–34.0)
MCHC: 32.2 g/dL (ref 30.0–36.0)
MCV: 95.8 fL (ref 80.0–100.0)
Platelets: 185 10*3/uL (ref 150–400)
RBC: 4.25 MIL/uL (ref 3.87–5.11)
RDW: 14.4 % (ref 11.5–15.5)
WBC: 5.2 10*3/uL (ref 4.0–10.5)
nRBC: 0 % (ref 0.0–0.2)

## 2018-08-31 LAB — TROPONIN I: Troponin I: <0.03 ng/mL (ref <0.03)

## 2018-08-31 MED ORDER — ACETAMINOPHEN 500 MG PO TABS
1000.0000 mg | ORAL_TABLET | Freq: Once | ORAL | Status: AC
  Administered 2018-08-31: 17:00:00 1000 mg via ORAL
  Filled 2018-08-31: qty 2

## 2018-08-31 NOTE — ED Notes (Signed)
Pt given dc instructions. Pt verbalizes understanding. Pt pushed in wheelchair to the lobby to meet her daughter.

## 2018-08-31 NOTE — ED Provider Notes (Signed)
[facility] Provider Note   CSN: [id number] Arrival date & time: 08/31/18  1613     History   Chief Complaint Chief Complaint  Patient presents with   Chest Pain   Shortness of Breath   Sore Throat   Fever   Chills   Cough    HPI Caitlyn Moraleshyllis D Lenhard is a 68 y.o. female.     HPI  68 year old female, with a PMH of CAD, HTN, carotid stenosis, HLD presents with a 2 to 3 days history of sore throat.  She notes she has had a mild cough and some clear sputum production.  She states she started having some chest pain or shortness of breath today.  She also started having fever, chills and body aches today.  She had a blood test for coronavirus a week ago which was negative.  Patient states she went to her cardiologist this morning and they sent her to the ER for further evaluation because of her fever.  She has not had any Tylenol or Motrin.  She states chest pain has resolved.  She denies any known sick contacts, exposure to coronavirus.  Past Medical History:  Diagnosis Date   Anxiety    Arthritis    "legs" (01/07/2016)   Back pain    CAD 06/2009   a.  s/p MI 4/11: tx with DES x 2 to RCA;  b.  LHC 03/17/11: LAD 30-40%, distal LAD 30-40%, OM2 40-50%, RCA stent patent, EF greater than 70%.;  c.  Lexiscan Myoview (12/15):  Mild apical thinning, no ischemia, EF 61%; normal study   CAROTID STENOSIS    carotid Dopplers 03/25/11: RICA 60-79%; LICA 0-39%.  Follow up recommended in 6 months.   Constipation    Depression    Gout    "on RX for it" (01/07/2016)   Heart disease    HYPERLIPIDEMIA    HYPERTENSION    HYPOTHYROIDISM    post ablation tx Graves   Hypothyroidism    Joint pain    Myocardial infarction [facility](HCC) 2012   "mini"   OSA on CPAP    Osteoarthritis    Pre-diabetes    PVD    Sleep apnea    TOBACCO ABUSE quit 06/2009   Varicose veins    VITAMIN D DEFICIENCY    DEXA 04/2009 normal    Patient Active  Problem List   Diagnosis Date Noted   Class 3 severe obesity with serious comorbidity and body mass index (BMI) of 40.0 to 44.9 in adult [facility](HCC) 02/23/2018   Prediabetes 01/13/2018   Upper airway cough syndrome 05/29/2016   Severe obesity (BMI >= 40) (HCC) 05/29/2016   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   Acute coronary syndrome (HCC) 01/06/2016   Edema 03/16/2013   Gout 03/16/2013   Arthralgia of ankle 03/16/2013   Depression    Myalgia 11/11/2011   Leg pain 07/31/2011   Urinary tract infection 03/18/2011   VITAMIN D DEFICIENCY 03/15/2010   TOBACCO ABUSE 03/15/2010   APHTHOUS ULCERS 03/15/2010   CAROTID STENOSIS 07/24/2009   HYPOTHYROIDISM 07/23/2009   HYPERLIPIDEMIA 07/23/2009   Essential hypertension, benign 07/23/2009   Coronary atherosclerosis 07/23/2009   PVD 07/23/2009   GRAVES' DISEASE, HX OF 07/23/2009    Past Surgical History:  Procedure Laterality Date   CARDIAC CATHETERIZATION N/A 01/07/2016   Procedure: Left Heart Cath and Coronary Angiography;  Surgeon: Rinaldo CloudMohan Harwani, MD;  [facility];  Service: Cardiovascular;  Laterality: N/A;   CARDIAC CATHETERIZATION  N/A 01/07/2016   Procedure: Coronary Stent Intervention;  Surgeon: Rinaldo CloudMohan Harwani, MD;  Location: [facility] INVASIVE CV LAB;  Service: Cardiovascular;  Laterality: N/A;   CAROTID STENT Left    bilateral carotid artery disease post stent of left carotid   CESAREAN SECTION  1985   "twins"   CORONARY ANGIOPLASTY     LEFT HEART CATHETERIZATION WITH CORONARY ANGIOGRAM N/A 03/17/2011   Procedure: LEFT HEART CATHETERIZATION WITH CORONARY ANGIOGRAM;  Surgeon: Herby Abrahamhomas D Stuckey, MD;  [facility];  Service: Cardiovascular;  Laterality: N/A;   PERIPHERAL VASCULAR CATHETERIZATION N/A 04/06/2015   Procedure: Abdominal Aortogram;  Surgeon: Sherren Kernsharles E Fields, MD;  [facility];  Service: Cardiovascular;  Laterality: N/A;   RADIOACTIVE PLAQUE INSERTION     Graves  disease post radioactive treatment complicated hypothyroidism     OB History    Gravida  2   Para  2   Term      Preterm      AB      Living        SAB      TAB      Ectopic      Multiple      Live Births               Home Medications    Prior to Admission medications   Medication Sig Start Date End Date Taking? Authorizing Provider  acetaminophen (TYLENOL) 500 MG tablet Take 1,000 mg by mouth every 6 (six) hours as needed for moderate pain.    [provider]  albuterol (PROVENTIL HFA;VENTOLIN HFA) 108 (90 BASE) MCG/ACT inhaler Inhale 2 puffs into the lungs every 6 (six) hours as needed for wheezing or shortness of breath.     [provider]  allopurinol (ZYLOPRIM) 100 MG tablet Take 1 tablet (100 mg total) by mouth daily. Patient taking differently: Take 300 mg by mouth daily.  08/22/14   Newt LukesLeschber, Valerie A, MD  allopurinol (ZYLOPRIM) 300 MG tablet  05/12/18   [provider]  aspirin 81 MG tablet Take 81 mg by mouth daily. Reported on 04/04/2015    [provider]  atorvastatin (LIPITOR) 20 MG tablet Take 20 mg by mouth daily.  08/31/15   [provider]  Azilsartan-Chlorthalidone (EDARBYCLOR) 40-12.5 MG TABS Take by mouth.    [provider]  carvedilol (COREG) 3.125 MG tablet Take 3.125 mg by mouth 2 (two) times daily with a meal.    [provider]  clonazePAM (KLONOPIN) 1 MG tablet Take 1 mg by mouth at bedtime.  10/05/15   [provider]  clopidogrel (PLAVIX) 75 MG tablet  12/16/17   [provider]  colchicine (COLCRYS) 0.6 MG tablet Take 0.6 mg by mouth daily as needed (for flare ups). Flare ups 07/19/12   Newt LukesLeschber, Valerie A, MD  EDARBI 40 MG TABS  12/22/17   [provider]  fluconazole (DIFLUCAN) 150 MG tablet Take 1 tablet (150 mg total) by mouth daily. 05/26/18   Burleson, Brand Maleserri L, NP  furosemide (LASIX) 40 MG tablet Take 40 mg by mouth.    [provider]    levothyroxine (SYNTHROID, LEVOTHROID) 125 MCG tablet Take 125 mcg by mouth daily before breakfast.    [provider]  metFORMIN (GLUCOPHAGE) 500 MG tablet Take 1 tablet (500 mg total) by mouth daily with breakfast. 03/15/18   Corinna CapraBrown, Angel A, DO  methocarbamol (ROBAXIN) 500 MG tablet Take 500 mg by mouth every 8 (eight)  hours as needed for muscle spasms.  12/03/15   [provider]  nitroGLYCERIN (NITROSTAT) 0.4 MG SL tablet  03/25/18   [provider]  Pediatric Multivit-Minerals-C (GUMMI BEAR MULTIVITAMIN/MIN) CHEW Chew 2 tablets by mouth daily.    [provider]  potassium chloride (K-DUR) 10 MEQ tablet  06/14/18   [provider]  terconazole (TERAZOL 7) 0.4 % vaginal cream Place 1 applicator vaginally at bedtime. Use for 7 days. 05/26/18   Currie Paris, NP  Vitamin D, Ergocalciferol, (DRISDOL) 50000 units CAPS capsule Take 50,000 Units by mouth every Monday.    [provider]    Family History Family History  Problem Relation Age of Onset   Heart attack Mother    Stroke Mother    Cancer Mother    Varicose Veins Mother    Thyroid disease Mother    Cancer Father    Heart disease Father    Coronary artery disease Other    Hypertension Other    Hypertension Sister     Social History Social History   Tobacco Use   Smoking status: Former Smoker    Packs/day: 1.00    Years: 35.00    Pack years: 35.00    Quit date: 06/30/2009    Years since quitting: 9.1   Smokeless tobacco: Never Used  Substance Use Topics   Alcohol use: Yes    Alcohol/week: 2.0 standard drinks    Types: 2 Glasses of wine per week    Comment: occ   Drug use: No     Allergies   Amlodipine and Statins   Review of Systems Review of Systems  Constitutional: Positive for chills and fever.  HENT: Positive for rhinorrhea and sore throat.   Respiratory: Positive for cough and shortness of breath.   Cardiovascular: Positive for chest  pain.  Gastrointestinal: Negative for abdominal pain, nausea and vomiting.     Physical Exam Updated Vital Signs BP (!) 156/60    Pulse 67    Temp (!) 100.6 F (38.1 C) (Oral)    Resp 17    Ht  (1.651 m)    Wt 120.2 kg    SpO2 100%    BMI 44.10 kg/m   Physical Exam Vitals signs and nursing note reviewed.  Constitutional:      Appearance: She is well-developed.  HENT:     Head: Normocephalic and atraumatic.     Mouth/Throat:     Lips: Pink.     Mouth: Mucous membranes are moist. No oral lesions.     Tongue: No lesions.     Palate: No lesions.     Pharynx: Oropharynx is clear. Uvula midline. No posterior oropharyngeal erythema.     Tonsils: No tonsillar exudate.  Eyes:     Conjunctiva/sclera: Conjunctivae normal.  Neck:     Musculoskeletal: Neck supple.  Cardiovascular:     Rate and Rhythm: Normal rate and regular rhythm.     Heart sounds: Normal heart sounds. No murmur.  Pulmonary:     Effort: Pulmonary effort is normal. No respiratory distress.     Breath sounds: Normal breath sounds. No wheezing or rales.  Abdominal:     General: Bowel sounds are normal. There is no distension.     Palpations: Abdomen is soft.     Tenderness: There is no abdominal tenderness.  Musculoskeletal: Normal range of motion.        General: No tenderness or deformity.  Skin:    General: Skin is  warm and dry.     Findings: No erythema or rash.  Neurological:     Mental Status: She is alert and oriented to person, place, and time.  Psychiatric:        Behavior: Behavior normal.      ED Treatments / Results  Labs (all labs ordered are listed, but only abnormal results are displayed) Labs Reviewed  NOVEL CORONAVIRUS, NAA (HOSPITAL ORDER, SEND-OUT TO REF LAB)  CBC  BASIC METABOLIC PANEL  TROPONIN I    EKG    Radiology Dg Chest Port 1 View  Result Date: 08/31/2018 CLINICAL DATA:  Fever, chills and chest pain today. Sore throat for 2-3 days. EXAM: PORTABLE CHEST 1 VIEW  COMPARISON:  Portable chest 01/06/2016.  CT chest 11/07/2016. FINDINGS: 1702 hours. Stable cardiomegaly and mild aortic atherosclerosis. Probable mild left basilar atelectasis, exaggerated by overlying soft tissues. No edema, confluent airspace opacity, pleural effusion or pneumothorax identified. The bones appear unremarkable. Telemetry leads overlie the chest. IMPRESSION: No suspected acute findings. Cardiomegaly with mild left basilar atelectasis. Electronically Signed   By: Carey BullocksWilliam  Veazey M.D.   On: 08/31/2018 17:38    Procedures Procedures (including critical care time)  Medications Ordered in ED Medications  acetaminophen (TYLENOL) tablet 1,000 mg (1,000 mg Oral Given 08/31/18 1704)     Initial Impression / Assessment and Plan / ED Course  I have reviewed the triage vital signs and the nursing notes.  Pertinent labs & imaging results that were available during my care of the patient were reviewed by me and considered in my medical decision making (see chart for details).        Presented with sore throat, rhinorrhea, fever, chills, myalgias and some associated shortness of breath.  She is very well-appearing on physical exam, no acute distress, nontoxic, non-lethargic.  She has no increased work of breathing or accessory muscle use.  Her lungs are clear to auscultation throughout.  Her O2 saturations have remained at 99% or greater.  She has no elevation in her white count, no anemia.  Her creatinine is slightly bumped compared to prior, consistent with some possible dehydration.  Her chest x-ray shows possible atelectasis, no pneumonia, pneumothorax, pleural effusion.  She has a coronavirus swab pending.  She had a troponin which is negative and an EKG with no acute ST changes.  History of this and that this is with ACS, PE, dissection, pericarditis.  Encourage self-isolation, monitoring symptoms, pulse ox.  She expressed understanding and is agreeable with plan.  She was given strict  return precautions.  She is ready and stable for discharge.  Caitlyn [name] was evaluated in Emergency Department on 08/31/2018 for the symptoms described in the history of present illness. She was evaluated in the context of the global COVID-19 pandemic, which necessitated consideration that the patient might be at risk for infection with the SARS-CoV-2 virus that causes COVID-19. Institutional protocols and algorithms that pertain to the evaluation of patients at risk for COVID-19 are in a state of rapid change based on information released by regulatory bodies including the CDC and federal and state organizations. These policies and algorithms were followed during the patient's care in the ED.   At this time there does not appear to be any evidence of an acute emergency medical condition and the patient appears stable for discharge with appropriate outpatient follow up.Diagnosis was discussed with patient who verbalizes understanding and is agreeable to discharge. Pt case discussed with Dr. Effie ShyWentz who agrees with my plan.  Final Clinical Impressions(s) / ED Diagnoses   Final diagnoses:  None    ED Discharge Orders    None       Rachel Moulds 08/31/18 2006    Daleen Bo, MD 09/01/18 [phone]

## 2018-08-31 NOTE — ED Triage Notes (Signed)
Pt  Presents with chest pain, chills, fever that started today. Pt started having cough, sore throat 2-3 days. Pts heart dr wanted her to come to ED bc of the chest pain and fever.

## 2018-08-31 NOTE — Discharge Instructions (Signed)
By a pulse ox from your local store and check your oxygen.  Your oxygen should be at 90% or greater.  If your oxygen is dropping consistently below 90% please return to the ER medially.  Take Tylenol as needed for body aches.  Please self isolate, wear your mask if he must go in public and wash your hands frequently.    You have been tested for coronavirus.  Your test is pending.  Please check your online medical record for your results.  If you test positive you must need CDC criteria prior to returning to work. You may return to work once you are fever free for 3 days, your symptoms have improved and has been 7 days since symptom onset.

## 2018-09-01 ENCOUNTER — Telehealth (HOSPITAL_COMMUNITY): Payer: Self-pay | Admitting: Emergency Medicine

## 2018-09-01 LAB — NOVEL CORONAVIRUS, NAA (HOSP ORDER, SEND-OUT TO REF LAB; TAT 18-24 HRS): SARS-CoV-2, NAA: DETECTED — AB

## 2018-09-08 ENCOUNTER — Encounter (HOSPITAL_COMMUNITY): Payer: Self-pay | Admitting: *Deleted

## 2018-09-08 ENCOUNTER — Other Ambulatory Visit: Payer: Self-pay

## 2018-09-08 ENCOUNTER — Inpatient Hospital Stay (HOSPITAL_COMMUNITY)
Admission: EM | Admit: 2018-09-08 | Discharge: 2018-09-13 | DRG: 177 | Disposition: A | Discharge status: Home Health | Payer: Medicare Other | Attending: Internal Medicine | Admitting: Internal Medicine

## 2018-09-08 ENCOUNTER — Emergency Department (HOSPITAL_COMMUNITY): Payer: Medicare Other

## 2018-09-08 DIAGNOSIS — M1991 Primary osteoarthritis, unspecified site: Secondary | ICD-10-CM | POA: Diagnosis present

## 2018-09-08 DIAGNOSIS — I2510 Atherosclerotic heart disease of native coronary artery without angina pectoris: Secondary | ICD-10-CM | POA: Diagnosis present

## 2018-09-08 DIAGNOSIS — F329 Major depressive disorder, single episode, unspecified: Secondary | ICD-10-CM | POA: Diagnosis present

## 2018-09-08 DIAGNOSIS — I951 Orthostatic hypotension: Secondary | ICD-10-CM | POA: Diagnosis present

## 2018-09-08 DIAGNOSIS — F32A Depression, unspecified: Secondary | ICD-10-CM | POA: Diagnosis present

## 2018-09-08 DIAGNOSIS — J1289 Other viral pneumonia: Secondary | ICD-10-CM | POA: Diagnosis present

## 2018-09-08 DIAGNOSIS — G4733 Obstructive sleep apnea (adult) (pediatric): Secondary | ICD-10-CM | POA: Diagnosis present

## 2018-09-08 DIAGNOSIS — Z6841 Body Mass Index (BMI) 40.0 and over, adult: Secondary | ICD-10-CM | POA: Diagnosis not present

## 2018-09-08 DIAGNOSIS — W19XXXA Unspecified fall, initial encounter: Secondary | ICD-10-CM | POA: Diagnosis present

## 2018-09-08 DIAGNOSIS — M109 Gout, unspecified: Secondary | ICD-10-CM | POA: Diagnosis present

## 2018-09-08 DIAGNOSIS — Z87891 Personal history of nicotine dependence: Secondary | ICD-10-CM | POA: Diagnosis not present

## 2018-09-08 DIAGNOSIS — Y92009 Unspecified place in unspecified non-institutional (private) residence as the place of occurrence of the external cause: Secondary | ICD-10-CM | POA: Diagnosis not present

## 2018-09-08 DIAGNOSIS — E871 Hypo-osmolality and hyponatremia: Secondary | ICD-10-CM | POA: Diagnosis present

## 2018-09-08 DIAGNOSIS — R55 Syncope and collapse: Secondary | ICD-10-CM

## 2018-09-08 DIAGNOSIS — Z7902 Long term (current) use of antithrombotics/antiplatelets: Secondary | ICD-10-CM

## 2018-09-08 DIAGNOSIS — J984 Other disorders of lung: Secondary | ICD-10-CM | POA: Diagnosis present

## 2018-09-08 DIAGNOSIS — Y9301 Activity, walking, marching and hiking: Secondary | ICD-10-CM | POA: Diagnosis present

## 2018-09-08 DIAGNOSIS — E039 Hypothyroidism, unspecified: Secondary | ICD-10-CM | POA: Diagnosis present

## 2018-09-08 DIAGNOSIS — R197 Diarrhea, unspecified: Secondary | ICD-10-CM | POA: Diagnosis present

## 2018-09-08 DIAGNOSIS — I10 Essential (primary) hypertension: Secondary | ICD-10-CM | POA: Diagnosis present

## 2018-09-08 DIAGNOSIS — Z955 Presence of coronary angioplasty implant and graft: Secondary | ICD-10-CM

## 2018-09-08 DIAGNOSIS — Z888 Allergy status to other drugs, medicaments and biological substances status: Secondary | ICD-10-CM

## 2018-09-08 DIAGNOSIS — Z8249 Family history of ischemic heart disease and other diseases of the circulatory system: Secondary | ICD-10-CM

## 2018-09-08 DIAGNOSIS — R7303 Prediabetes: Secondary | ICD-10-CM | POA: Diagnosis present

## 2018-09-08 DIAGNOSIS — Z79890 Hormone replacement therapy (postmenopausal): Secondary | ICD-10-CM

## 2018-09-08 DIAGNOSIS — J9601 Acute respiratory failure with hypoxia: Secondary | ICD-10-CM | POA: Diagnosis present

## 2018-09-08 DIAGNOSIS — E860 Dehydration: Secondary | ICD-10-CM | POA: Diagnosis present

## 2018-09-08 DIAGNOSIS — I252 Old myocardial infarction: Secondary | ICD-10-CM | POA: Diagnosis not present

## 2018-09-08 DIAGNOSIS — U071 COVID-19: Principal | ICD-10-CM | POA: Diagnosis present

## 2018-09-08 DIAGNOSIS — R509 Fever, unspecified: Secondary | ICD-10-CM | POA: Diagnosis not present

## 2018-09-08 DIAGNOSIS — E1151 Type 2 diabetes mellitus with diabetic peripheral angiopathy without gangrene: Secondary | ICD-10-CM | POA: Diagnosis present

## 2018-09-08 DIAGNOSIS — F419 Anxiety disorder, unspecified: Secondary | ICD-10-CM | POA: Diagnosis present

## 2018-09-08 DIAGNOSIS — E785 Hyperlipidemia, unspecified: Secondary | ICD-10-CM | POA: Diagnosis present

## 2018-09-08 DIAGNOSIS — Z7984 Long term (current) use of oral hypoglycemic drugs: Secondary | ICD-10-CM

## 2018-09-08 DIAGNOSIS — E559 Vitamin D deficiency, unspecified: Secondary | ICD-10-CM | POA: Diagnosis present

## 2018-09-08 DIAGNOSIS — Z7982 Long term (current) use of aspirin: Secondary | ICD-10-CM

## 2018-09-08 DIAGNOSIS — Z79899 Other long term (current) drug therapy: Secondary | ICD-10-CM

## 2018-09-08 LAB — SEDIMENTATION RATE: Sed Rate: 67 mm/hr — ABNORMAL HIGH (ref 0–22)

## 2018-09-08 LAB — CBC WITH DIFFERENTIAL/PLATELET
Abs Immature Granulocytes: 0 10*3/uL (ref 0.00–0.07)
Basophils Absolute: 0.1 10*3/uL (ref 0.0–0.1)
Basophils Relative: 1 %
Eosinophils Absolute: 0 10*3/uL (ref 0.0–0.5)
Eosinophils Relative: 0 %
HCT: 39.8 % (ref 36.0–46.0)
Hemoglobin: 13.3 g/dL (ref 12.0–15.0)
Lymphocytes Relative: 4 %
Lymphs Abs: 0.3 10*3/uL — ABNORMAL LOW (ref 0.7–4.0)
MCH: 30.4 pg (ref 26.0–34.0)
MCHC: 33.4 g/dL (ref 30.0–36.0)
MCV: 90.9 fL (ref 80.0–100.0)
Monocytes Absolute: 0.3 10*3/uL (ref 0.1–1.0)
Monocytes Relative: 4 %
Neutro Abs: 5.8 10*3/uL (ref 1.7–7.7)
Neutrophils Relative %: 91 %
Platelets: 209 10*3/uL (ref 150–400)
RBC: 4.38 MIL/uL (ref 3.87–5.11)
RDW: 13.3 % (ref 11.5–15.5)
WBC: 6.4 10*3/uL (ref 4.0–10.5)
nRBC: 0 % (ref 0.0–0.2)
nRBC: 0 /100 WBC

## 2018-09-08 LAB — PROCALCITONIN: Procalcitonin: 0.19 ng/mL

## 2018-09-08 LAB — CBC
HCT: 38.7 % (ref 36.0–46.0)
Hemoglobin: 13 g/dL (ref 12.0–15.0)
MCH: 30.9 pg (ref 26.0–34.0)
MCHC: 33.6 g/dL (ref 30.0–36.0)
MCV: 91.9 fL (ref 80.0–100.0)
Platelets: 213 10*3/uL (ref 150–400)
RBC: 4.21 MIL/uL (ref 3.87–5.11)
RDW: 13.5 % (ref 11.5–15.5)
WBC: 5.5 10*3/uL (ref 4.0–10.5)
nRBC: 0 % (ref 0.0–0.2)

## 2018-09-08 LAB — CBG MONITORING, ED: Glucose-Capillary: 145 mg/dL — ABNORMAL HIGH (ref 70–99)

## 2018-09-08 LAB — D-DIMER, QUANTITATIVE: D-Dimer, Quant: 1.39 ug/mL-FEU — ABNORMAL HIGH (ref 0.00–0.50)

## 2018-09-08 LAB — CREATININE, SERUM
Creatinine, Ser: 0.86 mg/dL (ref 0.44–1.00)
GFR calc Af Amer: >60 mL/min (ref >60)
GFR calc non Af Amer: >60 mL/min (ref >60)

## 2018-09-08 LAB — COMPREHENSIVE METABOLIC PANEL
ALT: 29 U/L (ref 0–44)
AST: 44 U/L — ABNORMAL HIGH (ref 15–41)
Albumin: 3.2 g/dL — ABNORMAL LOW (ref 3.5–5.0)
Alkaline Phosphatase: 56 U/L (ref 38–126)
Anion gap: 11 (ref 5–15)
BUN: 15 mg/dL (ref 8–23)
CO2: 29 mmol/L (ref 22–32)
Calcium: 8.7 mg/dL — ABNORMAL LOW (ref 8.9–10.3)
Chloride: 93 mmol/L — ABNORMAL LOW (ref 98–111)
Creatinine, Ser: 0.87 mg/dL (ref 0.44–1.00)
GFR calc Af Amer: >60 mL/min (ref >60)
GFR calc non Af Amer: >60 mL/min (ref >60)
Glucose, Bld: 129 mg/dL — ABNORMAL HIGH (ref 70–99)
Potassium: 3.6 mmol/L (ref 3.5–5.1)
Sodium: 133 mmol/L — ABNORMAL LOW (ref 135–145)
Total Bilirubin: 0.7 mg/dL (ref 0.3–1.2)
Total Protein: 7.3 g/dL (ref 6.5–8.1)

## 2018-09-08 LAB — TROPONIN I (HIGH SENSITIVITY): Troponin I (High Sensitivity): 12 ng/L (ref <18)

## 2018-09-08 LAB — FERRITIN: Ferritin: 343 ng/mL — ABNORMAL HIGH (ref 11–307)

## 2018-09-08 LAB — C-REACTIVE PROTEIN: CRP: 8.7 mg/dL — ABNORMAL HIGH (ref <1.0)

## 2018-09-08 LAB — LACTATE DEHYDROGENASE: LDH: 307 U/L — ABNORMAL HIGH (ref 98–192)

## 2018-09-08 LAB — ABO/RH: ABO/RH(D): AB POS

## 2018-09-08 MED ORDER — ASPIRIN EC 81 MG PO TBEC
81.0000 mg | DELAYED_RELEASE_TABLET | Freq: Every day | ORAL | Status: DC
  Administered 2018-09-09 – 2018-09-13 (×5): 81 mg via ORAL
  Filled 2018-09-08 (×11): qty 1

## 2018-09-08 MED ORDER — CLOPIDOGREL BISULFATE 75 MG PO TABS
75.0000 mg | ORAL_TABLET | Freq: Every day | ORAL | Status: DC
  Administered 2018-09-08 – 2018-09-13 (×6): 75 mg via ORAL
  Filled 2018-09-08 (×6): qty 1

## 2018-09-08 MED ORDER — ALBUTEROL SULFATE HFA 108 (90 BASE) MCG/ACT IN AERS
2.0000 | INHALATION_SPRAY | Freq: Four times a day (QID) | RESPIRATORY_TRACT | Status: DC | PRN
  Administered 2018-09-10 (×2): 2 via RESPIRATORY_TRACT
  Filled 2018-09-08: qty 6.7

## 2018-09-08 MED ORDER — ACETAMINOPHEN 500 MG PO TABS
1000.0000 mg | ORAL_TABLET | Freq: Four times a day (QID) | ORAL | Status: DC | PRN
  Administered 2018-09-11: 1000 mg via ORAL
  Filled 2018-09-08: qty 2

## 2018-09-08 MED ORDER — CLONAZEPAM 1 MG PO TABS
1.0000 mg | ORAL_TABLET | Freq: Every day | ORAL | Status: DC
  Administered 2018-09-08 – 2018-09-12 (×5): 1 mg via ORAL
  Filled 2018-09-08 (×5): qty 1

## 2018-09-08 MED ORDER — SODIUM CHLORIDE 0.9 % IV SOLN: INTRAVENOUS | Status: DC

## 2018-09-08 MED ORDER — LEVOTHYROXINE SODIUM 75 MCG PO TABS
125.0000 ug | ORAL_TABLET | Freq: Every day | ORAL | Status: DC
  Administered 2018-09-09 – 2018-09-13 (×5): 125 ug via ORAL
  Filled 2018-09-08 (×5): qty 1

## 2018-09-08 MED ORDER — SODIUM CHLORIDE 0.9 % IV SOLN
100.0000 mg | INTRAVENOUS | Status: AC
  Administered 2018-09-09 – 2018-09-12 (×4): 100 mg via INTRAVENOUS
  Filled 2018-09-08 (×4): qty 20

## 2018-09-08 MED ORDER — ENOXAPARIN SODIUM 60 MG/0.6ML ~~LOC~~ SOLN
0.5000 mg/kg | SUBCUTANEOUS | Status: DC
  Administered 2018-09-08 – 2018-09-12 (×5): 55 mg via SUBCUTANEOUS
  Filled 2018-09-08 (×6): qty 0.55

## 2018-09-08 MED ORDER — METHYLPREDNISOLONE SODIUM SUCC 125 MG IJ SOLR
60.0000 mg | Freq: Four times a day (QID) | INTRAMUSCULAR | Status: DC
  Administered 2018-09-08 – 2018-09-10 (×8): 60 mg via INTRAVENOUS
  Filled 2018-09-08 (×8): qty 2

## 2018-09-08 MED ORDER — SODIUM CHLORIDE 0.9 % IV SOLN
200.0000 mg | Freq: Once | INTRAVENOUS | Status: AC
  Administered 2018-09-08: 200 mg via INTRAVENOUS
  Filled 2018-09-08: qty 40

## 2018-09-08 MED ORDER — POTASSIUM CHLORIDE IN NACL 20-0.9 MEQ/L-% IV SOLN
INTRAVENOUS | Status: DC
  Administered 2018-09-08: 19:00:00 via INTRAVENOUS
  Filled 2018-09-08: qty 1000

## 2018-09-08 MED ORDER — CARVEDILOL 3.125 MG PO TABS
3.1250 mg | ORAL_TABLET | Freq: Two times a day (BID) | ORAL | Status: DC
  Administered 2018-09-08 – 2018-09-13 (×7): 3.125 mg via ORAL
  Filled 2018-09-08 (×12): qty 1

## 2018-09-08 MED ORDER — ATORVASTATIN CALCIUM 10 MG PO TABS
20.0000 mg | ORAL_TABLET | Freq: Every day | ORAL | Status: DC
  Filled 2018-09-08 (×5): qty 2

## 2018-09-08 MED ORDER — ENSURE ENLIVE PO LIQD
237.0000 mL | Freq: Two times a day (BID) | ORAL | Status: DC
  Administered 2018-09-09 – 2018-09-12 (×5): 237 mL via ORAL

## 2018-09-08 NOTE — Progress Notes (Signed)
Pharmacy Brief Note  O:  ALT: 29 CXR: possible PNA SpO2: 93% on 6L [place]  A/P:  Patient meets requirements for remdesivir therapy. Will order remdesivir 200 mg iv once followed by 100 mg iv daily x 4 days.   Ulice Dash, PharmD, BCPS Clinical Pharmacist

## 2018-09-08 NOTE — ED Notes (Addendum)
Pt RR increased to 24-26 on 6L, O2 remained 96 - 100%.  Pt denies any increase in sob.  MD is aware.

## 2018-09-08 NOTE — Progress Notes (Signed)
Pt arrived to unit this afternoon, she was on 6 L [place], I have been able to wean her to 5 L. She is resting comfortably in bed.  Admission database is completed. Pt daughter notified of arrival to unit, advised that she is now on 5 L, not in distress, advise we will update her 2 times a day, and when we will call her.  Advised that her mother does have a phone in her room to call the family.  She does have her cell phone, but the reception in the room is not good. I advised that we did start on the antiviral IV medication, and treating symptoms at this time.   She was grateful for [place] caring for her mom at this time.

## 2018-09-08 NOTE — ED Provider Notes (Signed)
[facility] Provider Note   CSN: [id number] Arrival date & time: 09/08/18  1024    History   Chief Complaint Chief Complaint  Patient presents with  . Loss of Consciousness    HPI Caitlyn [name] is a 68 y.o. female.     68 yo F with a cc of a syncopal event.  The patient was walking to the bathroom and suddenly collapsed.  States that she has been feeling weak since she woke up this morning.  Was diagnosed with the novel coronavirus about a week ago.  Has been having fevers off and on and coughing.  Denies shortness of breath denies chest pain denies neck pain back pain abdominal pain extremity pain.  She struck her nose when she fell and has had some bleeding to that area.  She has false teeth and denies any issue with that.  Denies any unilateral numbness or weakness denies difficulty with speech or swallowing.  She was picked up by EMS and noted to have an oxygen saturation in the low 80s.  Was started on nonrebreather with improvement.  The history is provided by the patient.  Loss of Consciousness Episode history:  Single Most recent episode:  Today Timing:  Rare Progression:  Resolved Chronicity:  New Witnessed: no   Relieved by:  Lying down Worsened by:  Nothing Ineffective treatments:  None tried Associated symptoms: weakness (general)   Associated symptoms: no chest pain, no dizziness, no fever, no headaches, no nausea, no palpitations, no shortness of breath and no vomiting     Past Medical History:  Diagnosis Date  . Anxiety   . Arthritis    "legs" (01/07/2016)  . Back pain   . CAD 06/2009   a.  s/p MI 4/11: tx with DES x 2 to RCA;  b.  LHC 03/17/11: LAD 30-40%, distal LAD 30-40%, OM2 40-50%, RCA stent patent, EF greater than 70%.;  c.  Lexiscan Myoview (12/15):  Mild apical thinning, no ischemia, EF 61%; normal study  . CAROTID STENOSIS    carotid Dopplers 03/25/11: RICA 60-79%; LICA 0-39%.  Follow up recommended in 6  months.  . Constipation   . Depression   . Gout    "on RX for it" (01/07/2016)  . Heart disease   . HYPERLIPIDEMIA   . HYPERTENSION   . HYPOTHYROIDISM    post ablation tx Graves  . Hypothyroidism   . Joint pain   . Myocardial infarction [facility](HCC) 2012   "mini"  . OSA on CPAP   . Osteoarthritis   . Pre-diabetes   . PVD   . Sleep apnea   . TOBACCO ABUSE quit 06/2009  . Varicose veins   . VITAMIN D DEFICIENCY    DEXA 04/2009 normal    Patient Active Problem List   Diagnosis Date Noted  . Class 3 severe obesity with serious comorbidity and body mass index (BMI) of 40.0 to 44.9 in adult (HCC) 02/23/2018  . Prediabetes 01/13/2018  . Upper airway cough syndrome 05/29/2016  . Severe obesity (BMI >= 40) (HCC) 05/29/2016  . 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  . Acute coronary syndrome (HCC) 01/06/2016  . Edema 03/16/2013  . Gout 03/16/2013  . Arthralgia of ankle 03/16/2013  . Depression   . Myalgia 11/11/2011  . Leg pain 07/31/2011  . Urinary tract infection 03/18/2011  . VITAMIN D DEFICIENCY 03/15/2010  . TOBACCO ABUSE 03/15/2010  . APHTHOUS ULCERS 03/15/2010  . CAROTID STENOSIS 07/24/2009  . HYPOTHYROIDISM 07/23/2009  .  HYPERLIPIDEMIA 07/23/2009  . Essential hypertension, benign 07/23/2009  . Coronary atherosclerosis 07/23/2009  . PVD 07/23/2009  . GRAVES' DISEASE, HX OF 07/23/2009    Past Surgical History:  Procedure Laterality Date  . CARDIAC CATHETERIZATION N/A 01/07/2016   Procedure: Left Heart Cath and Coronary Angiography;  Surgeon: Charolette Forward, MD;  Location: [facility];  Service: Cardiovascular;  Laterality: N/A;  . CARDIAC CATHETERIZATION N/A 01/07/2016   Procedure: Coronary Stent Intervention;  Surgeon: Charolette Forward, MD;  Location: [facility];  Service: Cardiovascular;  Laterality: N/A;  . CAROTID STENT Left    bilateral carotid artery disease post stent of left carotid  . [place]   "twins"  . CORONARY ANGIOPLASTY    .  LEFT HEART CATHETERIZATION WITH CORONARY ANGIOGRAM N/A 03/17/2011   Procedure: LEFT HEART CATHETERIZATION WITH CORONARY ANGIOGRAM;  Surgeon: Hillary Bow, MD;  Location: [facility] CATH LAB;  Service: Cardiovascular;  Laterality: N/A;  . PERIPHERAL VASCULAR CATHETERIZATION N/A 04/06/2015   Procedure: Abdominal Aortogram;  Surgeon: Elam Dutch, MD;  Location: [place] [facility];  Service: Cardiovascular;  Laterality: N/A;  . RADIOACTIVE PLAQUE INSERTION     Graves disease post radioactive treatment complicated hypothyroidism     OB History    Gravida  2   Para  2   Term      Preterm      AB      Living        SAB      TAB      Ectopic      Multiple      Live Births               Home Medications    Prior to Admission medications   Medication Sig Start Date End Date Taking? Authorizing Provider  acetaminophen (TYLENOL) 500 MG tablet Take 1,000 mg by mouth every 6 (six) hours as needed for moderate pain.    [provider]  albuterol (PROVENTIL HFA;VENTOLIN HFA) 108 (90 BASE) MCG/ACT inhaler Inhale 2 puffs into the lungs every 6 (six) hours as needed for wheezing or shortness of breath.     [provider]  allopurinol (ZYLOPRIM) 100 MG tablet Take 1 tablet (100 mg total) by mouth daily. Patient taking differently: Take 300 mg by mouth daily.  08/22/14   Rowe Clack, MD  allopurinol (ZYLOPRIM) 300 MG tablet  05/12/18   [provider]  aspirin 81 MG tablet Take 81 mg by mouth daily. Reported on 04/04/2015    [provider]  atorvastatin (LIPITOR) 20 MG tablet Take 20 mg by mouth daily.  08/31/15   [provider]  Azilsartan-Chlorthalidone (EDARBYCLOR) 40-12.5 MG TABS Take by mouth.    [provider]  carvedilol (COREG) 3.125 MG tablet Take 3.125 mg by mouth 2 (two) times daily with a meal.    [provider]  clonazePAM (KLONOPIN) 1 MG tablet Take 1 mg by mouth at bedtime.  10/05/15   [provider]  clopidogrel (PLAVIX) 75 MG tablet  12/16/17   [provider]  colchicine (COLCRYS) 0.6 MG tablet Take 0.6 mg by mouth daily as needed (for flare ups). Flare ups 07/19/12   Rowe Clack, MD  EDARBI 40 MG TABS  12/22/17   [provider]  fluconazole (DIFLUCAN) 150 MG tablet Take 1 tablet (150 mg total) by mouth daily. 05/26/18   Burleson, Rona Ravens, NP  furosemide (LASIX) 40 MG tablet Take  40 mg by mouth.    [provider]  levothyroxine (SYNTHROID, LEVOTHROID) 125 MCG tablet Take 125 mcg by mouth daily before breakfast.    [provider]  metFORMIN (GLUCOPHAGE) 500 MG tablet Take 1 tablet (500 mg total) by mouth daily with breakfast. 03/15/18   Corinna Capra A, DO  methocarbamol (ROBAXIN) 500 MG tablet Take 500 mg by mouth every 8 (eight) hours as needed for muscle spasms.  12/03/15   [provider]  nitroGLYCERIN (NITROSTAT) 0.4 MG SL tablet  03/25/18   [provider]  Pediatric Multivit-Minerals-C (GUMMI BEAR MULTIVITAMIN/MIN) CHEW Chew 2 tablets by mouth daily.    [provider]  potassium chloride (K-DUR) 10 MEQ tablet  06/14/18   [provider]  terconazole (TERAZOL 7) 0.4 % vaginal cream Place 1 applicator vaginally at bedtime. Use for 7 days. 05/26/18   Currie Paris, NP  Vitamin D, Ergocalciferol, (DRISDOL) 50000 units CAPS capsule Take 50,000 Units by mouth every Monday.    [provider]    Family History Family History  Problem Relation Age of Onset  . Heart attack Mother   . Stroke Mother   . Cancer Mother   . Varicose Veins Mother   . Thyroid disease Mother   . Cancer Father   . Heart disease Father   . Coronary artery disease Other   . Hypertension Other   . Hypertension Sister     Social History Social History   Tobacco Use  . Smoking status: Former Smoker    Packs/day: 1.00    Years: 35.00    Pack years: 35.00    Quit date: 06/30/2009    Years since quitting:  9.1  . Smokeless tobacco: Never Used  Substance Use Topics  . Alcohol use: Yes    Alcohol/week: 2.0 standard drinks    Types: 2 Glasses of wine per week    Comment: occ  . Drug use: No     Allergies   Amlodipine and Statins   Review of Systems Review of Systems  Constitutional: Negative for chills and fever.  HENT: Negative for congestion and rhinorrhea.   Eyes: Negative for redness and visual disturbance.  Respiratory: Negative for shortness of breath and wheezing.   Cardiovascular: Positive for syncope. Negative for chest pain and palpitations.  Gastrointestinal: Negative for nausea and vomiting.  Genitourinary: Negative for dysuria and urgency.  Musculoskeletal: Negative for arthralgias and myalgias.  Skin: Negative for pallor and wound.  Neurological: Positive for weakness (general). Negative for dizziness and headaches.     Physical Exam Updated Vital Signs BP (!) 121/52 (BP Location: Right Arm)   Pulse 64   Temp (!) 97.5 F (36.4 C) (Temporal)   Resp (!) 23   Ht  (1.702 m)   Wt 113.4 kg   SpO2 100%   BMI 39.16 kg/m   Physical Exam Vitals signs and nursing note reviewed.  Constitutional:      General: She is not in acute distress.    Appearance: She is well-developed. She is not diaphoretic.  HENT:     Head: Normocephalic.     Comments: Blood noted to the bilateral naris, no appreciable intraoral trauma.  No midline spinal tenderness able to rotate her head 45 degrees in either direction without pain. Eyes:     Pupils: Pupils are equal, round, and reactive to light.  Neck:     Musculoskeletal: Normal range of motion and neck supple.  Cardiovascular:     Rate  and Rhythm: Normal rate and regular rhythm.     Heart sounds: No murmur. No friction rub. No gallop.   Pulmonary:     Effort: Pulmonary effort is normal.     Breath sounds: No wheezing or rales.  Abdominal:     General: There is no distension.     Palpations: Abdomen is soft.      Tenderness: There is no abdominal tenderness.  Musculoskeletal:        General: No tenderness.     Comments: Palpated from head to toe without any other noted areas of bony tenderness.  Skin:    General: Skin is warm and dry.  Neurological:     Mental Status: She is alert and oriented to person, place, and time.  Psychiatric:        Behavior: Behavior normal.      ED Treatments / Results  Labs (all labs ordered are listed, but only abnormal results are displayed) Labs Reviewed  CBC WITH DIFFERENTIAL/PLATELET - Abnormal; Notable for the following components:      Result Value   Lymphs Abs 0.3 (*)    All other components within normal limits  COMPREHENSIVE METABOLIC PANEL - Abnormal; Notable for the following components:   Sodium 133 (*)    Chloride 93 (*)    Glucose, Bld 129 (*)    Calcium 8.7 (*)    Albumin 3.2 (*)    AST 44 (*)    All other components within normal limits  CBG MONITORING, ED - Abnormal; Notable for the following components:   Glucose-Capillary 145 (*)    All other components within normal limits  TROPONIN I (HIGH SENSITIVITY)  TROPONIN I (HIGH SENSITIVITY)    EKG EKG Interpretation  Date/Time:  Wednesday September 08 2018 10:31:02 EDT Ventricular Rate:  70 PR Interval:    QRS Duration: 100 QT Interval:  481 QTC Calculation: 520 R Axis:   61 Text Interpretation:  Sinus rhythm Left atrial enlargement Borderline repolarization abnormality Prolonged QT interval No significant change since last tracing Confirmed by Melene Plan [phone]) on 09/08/2018 10:52:56 AM   Radiology Ct Head Wo Contrast  Result Date: 09/08/2018 CLINICAL DATA:  Facial pain after syncope and fall today. EXAM: CT HEAD WITHOUT CONTRAST CT MAXILLOFACIAL WITHOUT CONTRAST TECHNIQUE: Multidetector CT imaging of the head and maxillofacial structures were performed using the standard protocol without intravenous contrast. Multiplanar CT image reconstructions of the maxillofacial structures were  also generated. COMPARISON:  None. FINDINGS: CT HEAD FINDINGS Brain: No evidence of acute infarction, hemorrhage, hydrocephalus, extra-axial collection or mass lesion/mass effect. Vascular: No hyperdense vessel or unexpected calcification. Skull: Normal. Negative for fracture or focal lesion. Other: None. CT MAXILLOFACIAL FINDINGS Osseous: No fracture or mandibular dislocation. No destructive process. Orbits: Negative. No traumatic or inflammatory finding. Sinuses: Clear. Soft tissues: Negative. IMPRESSION: Normal head CT. No definite abnormality seen in maxillofacial region. Electronically Signed   By: Lupita Raider M.D.   On: 09/08/2018 12:08   Dg Chest Port 1 View  Result Date: 09/08/2018 CLINICAL DATA:  Syncope. Hypoxia. COVID-19 positive. EXAM: PORTABLE CHEST 1 VIEW COMPARISON:  08/31/2018 FINDINGS: The cardiac silhouette remains mildly enlarged. The lungs are mildly hypoinflated. There is new airspace opacity in the left lung base, and there is milder patchy and streaky opacity in the right lung base. The interstitial markings are mildly increased bilaterally compared to the prior study. No sizable pleural effusion or pneumothorax is identified. Mild thoracic scoliosis is noted. IMPRESSION: New left basilar airspace opacity  concerning for pneumonia. Additional right basilar pneumonia versus atelectasis. Electronically Signed   By: Sebastian AcheAllen  Grady M.D.   On: 09/08/2018 11:35   Ct Maxillofacial Wo Contrast  Result Date: 09/08/2018 CLINICAL DATA:  Facial pain after syncope and fall today. EXAM: CT HEAD WITHOUT CONTRAST CT MAXILLOFACIAL WITHOUT CONTRAST TECHNIQUE: Multidetector CT imaging of the head and maxillofacial structures were performed using the standard protocol without intravenous contrast. Multiplanar CT image reconstructions of the maxillofacial structures were also generated. COMPARISON:  None. FINDINGS: CT HEAD FINDINGS Brain: No evidence of acute infarction, hemorrhage, hydrocephalus,  extra-axial collection or mass lesion/mass effect. Vascular: No hyperdense vessel or unexpected calcification. Skull: Normal. Negative for fracture or focal lesion. Other: None. CT MAXILLOFACIAL FINDINGS Osseous: No fracture or mandibular dislocation. No destructive process. Orbits: Negative. No traumatic or inflammatory finding. Sinuses: Clear. Soft tissues: Negative. IMPRESSION: Normal head CT. No definite abnormality seen in maxillofacial region. Electronically Signed   By: Lupita RaiderJames  Green Jr M.D.   On: 09/08/2018 12:08    Procedures Procedures (including critical care time)  Medications Ordered in ED Medications - No data to display   Initial Impression / Assessment and Plan / ED Course  I have reviewed the triage vital signs and the nursing notes.  Pertinent labs & imaging results that were available during my care of the patient were reviewed by me and considered in my medical decision making (see chart for details).        68 yo F with a chief complaint of a syncopal event.  Patient was diagnosed with the novel coronavirus about a week ago and felt weak this morning when she woke up.  Found to be newly hypoxic in the low 80s by EMS.  Suspect this was a primary hypoxic collapse.  Will obtain lab work CT of the head and face reassess.  CT scans are negative.  The patient still does require oxygen.  Chest x-ray with a possible left lower lobe pneumonia.  Will discuss with the hospitalist.  Hospitalist will admit.  CRITICAL CARE Performed by: Rae Roamaniel Patrick Kayley Zeiders   Total critical care time: 35 minutes  Critical care time was exclusive of separately billable procedures and treating other patients.  Critical care was necessary to treat or prevent imminent or life-threatening deterioration.  Critical care was time spent personally by me on the following activities: development of treatment plan with patient and/or surrogate as well as nursing, discussions with consultants, evaluation  of patient's response to treatment, examination of patient, obtaining history from patient or surrogate, ordering and performing treatments and interventions, ordering and review of laboratory studies, ordering and review of radiographic studies, pulse oximetry and re-evaluation of patient's condition.  The patients results and plan were reviewed and discussed.   Any x-rays performed were independently reviewed by myself.   Differential diagnosis were considered with the presenting HPI.  Medications - No data to display  Vitals:   09/08/18 1046 09/08/18 1047  BP: (!) 121/52   Pulse: 64   Resp: (!) 23   Temp: (!) 97.5 F (36.4 C)   TempSrc: Temporal   SpO2: 100%   Weight:  113.4 kg  Height:  5\' 7"  (1.702 m)    Final diagnoses:  COVID-19 virus infection  Acute respiratory failure with hypoxia (HCC)  Syncope and collapse    Admission/ observation were discussed with the admitting physician, patient and/or family and they are comfortable with the plan.    Final Clinical Impressions(s) / ED Diagnoses   Final diagnoses:  COVID-19 virus infection  Acute respiratory failure with hypoxia [facility](HCC)  Syncope and collapse    ED Discharge Orders    None       Melene PlanFloyd, Lashan Gluth, DO 09/08/18 1407

## 2018-09-08 NOTE — ED Notes (Signed)
Admitting MD at bedside.

## 2018-09-08 NOTE — ED Notes (Signed)
Called daughter and gave her an update.

## 2018-09-08 NOTE — ED Notes (Signed)
NURSE BRENDA RN IS GETTING THE BLOOD .
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
## 2018-09-08 NOTE — Plan of Care (Signed)
  Problem: Health Behavior/Discharge Planning: Goal: Ability to manage health-related needs will improve Outcome: Progressing   Problem: Clinical Measurements: Goal: Ability to maintain clinical measurements within normal limits will improve Outcome: Progressing Goal: Will remain free from infection Outcome: Progressing Goal: Diagnostic test results will improve Outcome: Progressing Goal: Respiratory complications will improve Outcome: Progressing Goal: Cardiovascular complication will be avoided Outcome: Progressing   Problem: Activity: Goal: Risk for activity intolerance will decrease Outcome: Progressing   Problem: Nutrition: Goal: Adequate nutrition will be maintained Outcome: Progressing   Problem: Coping: Goal: Level of anxiety will decrease Outcome: Progressing   Problem: Education: Goal: Knowledge of risk factors and measures for prevention of condition will improve Outcome: Progressing   Problem: Coping: Goal: Psychosocial and spiritual needs will be supported Outcome: Progressing   Problem: Respiratory: Goal: Will maintain a patent airway Outcome: Progressing Goal: Complications related to the disease process, condition or treatment will be avoided or minimized Outcome: Progressing

## 2018-09-08 NOTE — ED Notes (Signed)
Pt speaking to daughter on phone

## 2018-09-08 NOTE — ED Notes (Signed)
ED TO INPATIENT HANDOFF REPORT  ED Nurse Name and Phone #:  Steward DroneBrenda [phone]  S Name/Age/Gender Caitlyn [name] 68 y.o. female Room/Bed: TRACC/TRACC  Code Status   Code Status: Prior  Home/SNF/Other Home Patient oriented to: self, place, time and situation Is this baseline? Yes   Triage Complete: Triage complete  Chief Complaint COVID positive  Triage Note Pt here from home via [facility] for syncopal episode.  Pt was dx with covid 19 on the 16th of June. Pt does not remember how she fell, but remembers waking up on the floor, and laying there for 30 min.  She then crawled to the phone and called her daughter.  Blood noted to nose and tongue.  Pt ao x 4.  Initial sats of 88% on RA that were increased to 96 with nrb.  sats decreased to 88% when O2 removed to clean facial wounds.  CBG 65.   Allergies Allergies  Allergen Reactions  . Amlodipine Swelling  . Statins Other (See Comments)    myalgia    Level of Care/Admitting Diagnosis ED Disposition    ED Disposition Condition Comment   Admit  [facility] [[id number]]  Level of Care: Telemetry [5]  Covid Evaluation: Confirmed COVID Positive  Isolation Risk Level: High Risk/Airborne (Aerosolizing procedure, nebulizer, intubated/ventilation, CPAP/BiPAP)  Diagnosis: COVID-19 virus infection [[id number]][[phone]]  Admitting Physician: Pieter PartridgeHATTERJEE, SROBONA TUBLU [[id number]]  Attending Physician: Pieter PartridgeHATTERJEE, SROBONA TUBLU [[id number]]  Estimated length of stay: past midnight tomorrow  Certification:: I certify this patient will need inpatient services for at least 2 midnights  PT Class (Do Not Modify): Inpatient [101]  PT Acc Code (Do Not Modify): Private [1]       B Medical/Surgery History Past Medical History:  Diagnosis Date  . Anxiety   . Arthritis    "legs" (01/07/2016)  . Back pain   . CAD 06/2009   a.  s/p MI 4/11: tx with DES x 2 to RCA;  b.  LHC 03/17/11: LAD 30-40%, distal LAD 30-40%, OM2 40-50%, RCA  stent patent, EF greater than 70%.;  c.  Lexiscan Myoview (12/15):  Mild apical thinning, no ischemia, EF 61%; normal study  . CAROTID STENOSIS    carotid Dopplers 03/25/11: RICA 60-79%; LICA 0-39%.  Follow up recommended in 6 months.  . Constipation   . Depression   . Gout    "on RX for it" (01/07/2016)  . Heart disease   . HYPERLIPIDEMIA   . HYPERTENSION   . HYPOTHYROIDISM    post ablation tx Graves  . Hypothyroidism   . Joint pain   . Myocardial infarction [facility](HCC) 2012   "mini"  . OSA on CPAP   . Osteoarthritis   . Pre-diabetes   . PVD   . Sleep apnea   . TOBACCO ABUSE quit 06/2009  . Varicose veins   . VITAMIN D DEFICIENCY    DEXA 04/2009 normal   Past Surgical History:  Procedure Laterality Date  . CARDIAC CATHETERIZATION N/A 01/07/2016   Procedure: Left Heart Cath and Coronary Angiography;  Surgeon: Rinaldo CloudMohan Harwani, MD;  [facility];  Service: Cardiovascular;  Laterality: N/A;  . CARDIAC CATHETERIZATION N/A 01/07/2016   Procedure: Coronary Stent Intervention;  Surgeon: Rinaldo CloudMohan Harwani, MD;  Location: [facility] INVASIVE CV LAB;  Service: Cardiovascular;  Laterality: N/A;  . CAROTID STENT Left    bilateral carotid artery disease post stent of left carotid  . CESAREAN SECTION  1985   "twins"  . CORONARY ANGIOPLASTY    .  LEFT HEART CATHETERIZATION WITH CORONARY ANGIOGRAM N/A 03/17/2011   Procedure: LEFT HEART CATHETERIZATION WITH CORONARY ANGIOGRAM;  Surgeon: Herby Abrahamhomas D Stuckey, MD;  [facility];  Service: Cardiovascular;  Laterality: N/A;  . PERIPHERAL VASCULAR CATHETERIZATION N/A 04/06/2015   Procedure: Abdominal Aortogram;  Surgeon: Sherren Kernsharles E Fields, MD;  [facility];  Service: Cardiovascular;  Laterality: N/A;  . RADIOACTIVE PLAQUE INSERTION     Graves disease post radioactive treatment complicated hypothyroidism     A IV Location/Drains/Wounds Patient Lines/Drains/Airways Status   Active Line/Drains/Airways    None           Intake/Output Last 24 hours No intake or output data in the 24 hours ending 09/08/18 1436  Labs/Imaging Results for orders placed or performed during the hospital encounter of 09/08/18 (from the past 48 hour(s))  CBG monitoring, ED     Status: Abnormal   Collection Time: 09/08/18 10:35 AM  Result Value Ref Range   Glucose-Capillary 145 (H) 70 - 99 mg/dL  CBC with Differential     Status: Abnormal   Collection Time: 09/08/18 11:53 AM  Result Value Ref Range   WBC 6.4 4.0 - 10.5 K/uL    Comment: WHITE COUNT CONFIRMED ON SMEAR   RBC 4.38 3.87 - 5.11 MIL/uL   Hemoglobin 13.3 12.0 - 15.0 g/dL   HCT 16.139.8 09.636.0 - 04.546.0 %   MCV 90.9 80.0 - 100.0 fL   MCH 30.4 26.0 - 34.0 pg   MCHC 33.4 30.0 - 36.0 g/dL   RDW 40.913.3 81.111.5 - 91.415.5 %   Platelets 209 150 - 400 K/uL   nRBC 0.0 0.0 - 0.2 %   Neutrophils Relative % 91 %   Neutro Abs 5.8 1.7 - 7.7 K/uL   Lymphocytes Relative 4 %   Lymphs Abs 0.3 (L) 0.7 - 4.0 K/uL   Monocytes Relative 4 %   Monocytes Absolute 0.3 0.1 - 1.0 K/uL   Eosinophils Relative 0 %   Eosinophils Absolute 0.0 0.0 - 0.5 K/uL   Basophils Relative 1 %   Basophils Absolute 0.1 0.0 - 0.1 K/uL   nRBC 0 0 /100 WBC   Abs Immature Granulocytes 0.00 0.00 - 0.07 K/uL    Comment: Performed at [facility], [address] [place], [place] [address]  Comprehensive metabolic panel     Status: Abnormal   Collection Time: 09/08/18 11:53 AM  Result Value Ref Range   Sodium 133 (L) 135 - 145 mmol/L   Potassium 3.6 3.5 - 5.1 mmol/L   Chloride 93 (L) 98 - 111 mmol/L   CO2 29 22 - 32 mmol/L   Glucose, Bld 129 (H) 70 - 99 mg/dL   BUN 15 8 - 23 mg/dL   Creatinine, Ser 6.210.87 0.44 - 1.00 mg/dL   Calcium 8.7 (L) 8.9 - 10.3 mg/dL   Total Protein 7.3 6.5 - 8.1 g/dL   Albumin 3.2 (L) 3.5 - 5.0 g/dL   AST 44 (H) 15 - 41 U/L   ALT 29 0 - 44 U/L   Alkaline Phosphatase 56 38 - 126 U/L   Total Bilirubin 0.7 0.3 - 1.2 mg/dL   GFR calc non Af Amer >60 >60 mL/min   GFR calc Af Amer >60  >60 mL/min   Anion gap 11 5 - 15    Comment: Performed at [facility], [address] [place], [place] [address]  Troponin I (High Sensitivity)     Status: None   Collection  Time: 09/08/18 11:53 AM  Result Value Ref Range   Troponin I (High Sensitivity) 12 <18 ng/L    Comment: (NOTE) Elevated high sensitivity troponin I (hsTnI) values and significant  changes across serial measurements may suggest ACS but many other  chronic and acute conditions are known to elevate hsTnI results.  Refer to the "Links" section for chest pain algorithms and additional  guidance. Performed at [facility], [place] [address] [place], [place] [address]    Ct Head Wo Contrast  Result Date: 09/08/2018 CLINICAL DATA:  Facial pain after syncope and fall today. EXAM: CT HEAD WITHOUT CONTRAST CT MAXILLOFACIAL WITHOUT CONTRAST TECHNIQUE: Multidetector CT imaging of the head and maxillofacial structures were performed using the standard protocol without intravenous contrast. Multiplanar CT image reconstructions of the maxillofacial structures were also generated. COMPARISON:  None. FINDINGS: CT HEAD FINDINGS Brain: No evidence of acute infarction, hemorrhage, hydrocephalus, extra-axial collection or mass lesion/mass effect. Vascular: No hyperdense vessel or unexpected calcification. Skull: Normal. Negative for fracture or focal lesion. Other: None. CT MAXILLOFACIAL FINDINGS Osseous: No fracture or mandibular dislocation. No destructive process. Orbits: Negative. No traumatic or inflammatory finding. Sinuses: Clear. Soft tissues: Negative. IMPRESSION: Normal head CT. No definite abnormality seen in maxillofacial region. Electronically Signed   By: Marijo Conception M.D.   On: 09/08/2018 12:08   Dg Chest Port 1 View  Result Date: 09/08/2018 CLINICAL DATA:  Syncope. Hypoxia. COVID-19 positive. EXAM: PORTABLE CHEST 1 VIEW COMPARISON:  08/31/2018 FINDINGS: The cardiac silhouette remains mildly enlarged. The lungs are  mildly hypoinflated. There is new airspace opacity in the left lung base, and there is milder patchy and streaky opacity in the right lung base. The interstitial markings are mildly increased bilaterally compared to the prior study. No sizable pleural effusion or pneumothorax is identified. Mild thoracic scoliosis is noted. IMPRESSION: New left basilar airspace opacity concerning for pneumonia. Additional right basilar pneumonia versus atelectasis. Electronically Signed   By: Logan Bores M.D.   On: 09/08/2018 11:35   Ct Maxillofacial Wo Contrast  Result Date: 09/08/2018 CLINICAL DATA:  Facial pain after syncope and fall today. EXAM: CT HEAD WITHOUT CONTRAST CT MAXILLOFACIAL WITHOUT CONTRAST TECHNIQUE: Multidetector CT imaging of the head and maxillofacial structures were performed using the standard protocol without intravenous contrast. Multiplanar CT image reconstructions of the maxillofacial structures were also generated. COMPARISON:  None. FINDINGS: CT HEAD FINDINGS Brain: No evidence of acute infarction, hemorrhage, hydrocephalus, extra-axial collection or mass lesion/mass effect. Vascular: No hyperdense vessel or unexpected calcification. Skull: Normal. Negative for fracture or focal lesion. Other: None. CT MAXILLOFACIAL FINDINGS Osseous: No fracture or mandibular dislocation. No destructive process. Orbits: Negative. No traumatic or inflammatory finding. Sinuses: Clear. Soft tissues: Negative. IMPRESSION: Normal head CT. No definite abnormality seen in maxillofacial region. Electronically Signed   By: Marijo Conception M.D.   On: 09/08/2018 12:08    Pending Labs Unresulted Labs (From admission, onward)    Start     Ordered   09/08/18 1106  Troponin I (High Sensitivity)  STAT Now then every 2 hours,   R (with STAT occurrences)    Question:  Indication  Answer:  Other   09/08/18 1105   Signed and Held  HIV antibody (Routine Testing)  Once,   R     Signed and Held   Signed and Held  ABO/Rh  Once,    R     Signed and Held   Signed and Held  CBC  (enoxaparin (LOVENOX)  CrCl >/= 30 ml/min)  Once,   R    Comments: Baseline for enoxaparin therapy IF NOT ALREADY DRAWN.  Notify MD if PLT < 100 K.    Signed and Held   Signed and Held  Creatinine, serum  (enoxaparin (LOVENOX)    CrCl >/= 30 ml/min)  Once,   R    Comments: Baseline for enoxaparin therapy IF NOT ALREADY DRAWN.    Signed and Held   Signed and Held  Creatinine, serum  (enoxaparin (LOVENOX)    CrCl >/= 30 ml/min)  Weekly,   R    Comments: while on enoxaparin therapy    Signed and Held   Signed and Held  SARS Coronavirus 2 [facility](Hospital order, Performed in [facility])  (Novel Coronavirus, [facility](Hospital Order))  Once,   R     Signed and Held   Signed and Held  D-dimer, quantitative (not at [facility])  Once,   R     Signed and Held   Signed and Held  Ferritin  Once,   R     Signed and Held   Signed and Held  C-reactive protein  Once,   R     Signed and Held   Signed and Held  Lactate dehydrogenase  Once,   R     Signed and Held   Signed and Held  Procalcitonin  Once,   R     Signed and Held   Signed and Held  Sedimentation rate  Once,   R     Signed and Held          Vitals/Pain Today's Vitals   09/08/18 1047 09/08/18 1400 09/08/18 1416 09/08/18 1430  BP:  (!) 117/59  125/60  Pulse:  66  66  Resp:  (!) 27  (!) 26  Temp:      TempSrc:      SpO2:  95%  96%  Weight: 113.4 kg     Height: 5\' 7"  (1.702 m)     PainSc: 7   0-No pain     Isolation Precautions No active isolations  Medications Medications  methylPREDNISolone sodium succinate (SOLU-MEDROL) 125 mg/2 mL injection 60 mg (has no administration in time range)    Mobility walks     Focused Assessments Neuro Assessment Handoff:  Swallow screen pass? Yes          Neuro Assessment: Within Defined Limits Neuro Checks:      Last Documented NIHSS Modified Score:   Has TPA been given? No If patient is a Neuro Trauma and patient is going to  OR before floor call report to 4N Charge nurse: [phone] or [phone]     R Recommendations: See Admitting Provider Note  Report given to:   Additional Notes:

## 2018-09-08 NOTE — ED Triage Notes (Signed)
Pt here [facility] home via [facility] for syncopal episode.  Pt was dx with covid 19 on the 16th of June. Pt does not remember how she fell, but remembers waking up on the floor, and laying there for 30 min.  She then crawled to the phone and called her daughter.  Blood noted to nose and tongue.  Pt ao x 4.  Initial sats of 88% on RA that were increased to 96 with nrb.  sats decreased to 88% when O2 removed to clean facial wounds.  CBG 65.

## 2018-09-09 LAB — CBC WITH DIFFERENTIAL/PLATELET
Abs Immature Granulocytes: 0.01 10*3/uL (ref 0.00–0.07)
Basophils Absolute: 0 10*3/uL (ref 0.0–0.1)
Basophils Relative: 0 %
Eosinophils Absolute: 0 10*3/uL (ref 0.0–0.5)
Eosinophils Relative: 0 %
HCT: 37.3 % (ref 36.0–46.0)
Hemoglobin: 12.4 g/dL (ref 12.0–15.0)
Immature Granulocytes: 0 %
Lymphocytes Relative: 19 %
Lymphs Abs: 0.5 10*3/uL — ABNORMAL LOW (ref 0.7–4.0)
MCH: 30.7 pg (ref 26.0–34.0)
MCHC: 33.2 g/dL (ref 30.0–36.0)
MCV: 92.3 fL (ref 80.0–100.0)
Monocytes Absolute: 0.1 10*3/uL (ref 0.1–1.0)
Monocytes Relative: 5 %
Neutro Abs: 1.9 10*3/uL (ref 1.7–7.7)
Neutrophils Relative %: 76 %
Platelets: 230 10*3/uL (ref 150–400)
RBC: 4.04 MIL/uL (ref 3.87–5.11)
RDW: 13.6 % (ref 11.5–15.5)
WBC: 2.5 10*3/uL — ABNORMAL LOW (ref 4.0–10.5)
nRBC: 0 % (ref 0.0–0.2)

## 2018-09-09 LAB — FERRITIN: Ferritin: 342 ng/mL — ABNORMAL HIGH (ref 11–307)

## 2018-09-09 LAB — D-DIMER, QUANTITATIVE: D-Dimer, Quant: 0.94 ug/mL-FEU — ABNORMAL HIGH (ref 0.00–0.50)

## 2018-09-09 LAB — COMPREHENSIVE METABOLIC PANEL
ALT: 26 U/L (ref 0–44)
AST: 35 U/L (ref 15–41)
Albumin: 3 g/dL — ABNORMAL LOW (ref 3.5–5.0)
Alkaline Phosphatase: 54 U/L (ref 38–126)
Anion gap: 10 (ref 5–15)
BUN: 15 mg/dL (ref 8–23)
CO2: 27 mmol/L (ref 22–32)
Calcium: 8.4 mg/dL — ABNORMAL LOW (ref 8.9–10.3)
Chloride: 98 mmol/L (ref 98–111)
Creatinine, Ser: 0.74 mg/dL (ref 0.44–1.00)
GFR calc Af Amer: >60 mL/min (ref >60)
GFR calc non Af Amer: >60 mL/min (ref >60)
Glucose, Bld: 161 mg/dL — ABNORMAL HIGH (ref 70–99)
Potassium: 3.3 mmol/L — ABNORMAL LOW (ref 3.5–5.1)
Sodium: 135 mmol/L (ref 135–145)
Total Bilirubin: 0.2 mg/dL — ABNORMAL LOW (ref 0.3–1.2)
Total Protein: 7 g/dL (ref 6.5–8.1)

## 2018-09-09 LAB — HEMOGLOBIN A1C
Hgb A1c MFr Bld: 6 % — ABNORMAL HIGH (ref 4.8–5.6)
Mean Plasma Glucose: 125.5 mg/dL

## 2018-09-09 LAB — LACTATE DEHYDROGENASE: LDH: 272 U/L — ABNORMAL HIGH (ref 98–192)

## 2018-09-09 LAB — C-REACTIVE PROTEIN: CRP: 9.4 mg/dL — ABNORMAL HIGH (ref <1.0)

## 2018-09-09 LAB — MAGNESIUM: Magnesium: 2.3 mg/dL (ref 1.7–2.4)

## 2018-09-09 LAB — BRAIN NATRIURETIC PEPTIDE: B Natriuretic Peptide: 56.3 pg/mL (ref 0.0–100.0)

## 2018-09-09 LAB — GLUCOSE, CAPILLARY
Glucose-Capillary: 174 mg/dL — ABNORMAL HIGH (ref 70–99)
Glucose-Capillary: 174 mg/dL — ABNORMAL HIGH (ref 70–99)
Glucose-Capillary: 164 mg/dL — ABNORMAL HIGH (ref 70–99)

## 2018-09-09 LAB — PROCALCITONIN: Procalcitonin: 0.14 ng/mL

## 2018-09-09 LAB — HIV ANTIBODY (ROUTINE TESTING W REFLEX): HIV Screen 4th Generation wRfx: NONREACTIVE

## 2018-09-09 MED ORDER — INSULIN ASPART 100 UNIT/ML ~~LOC~~ SOLN
0.0000 [IU] | Freq: Three times a day (TID) | SUBCUTANEOUS | Status: DC
  Administered 2018-09-09 – 2018-09-11 (×6): 2 [IU] via SUBCUTANEOUS
  Administered 2018-09-11 – 2018-09-12 (×3): 1 [IU] via SUBCUTANEOUS

## 2018-09-09 MED ORDER — POTASSIUM CHLORIDE CRYS ER 20 MEQ PO TBCR
40.0000 meq | EXTENDED_RELEASE_TABLET | Freq: Once | ORAL | Status: AC
  Administered 2018-09-09: 40 meq via ORAL
  Filled 2018-09-09: qty 2

## 2018-09-09 MED ORDER — INSULIN ASPART 100 UNIT/ML ~~LOC~~ SOLN: 0.0000 [IU] | Freq: Every day | SUBCUTANEOUS | Status: DC

## 2018-09-09 MED ORDER — TOCILIZUMAB 400 MG/20ML IV SOLN
800.0000 mg | Freq: Once | INTRAVENOUS | Status: AC
  Administered 2018-09-09: 800 mg via INTRAVENOUS
  Filled 2018-09-09: qty 40

## 2018-09-09 MED ORDER — BISACODYL 5 MG PO TBEC
5.0000 mg | DELAYED_RELEASE_TABLET | Freq: Every day | ORAL | Status: DC | PRN
  Administered 2018-09-09: 5 mg via ORAL
  Filled 2018-09-09: qty 1

## 2018-09-09 MED ORDER — GUAIFENESIN-DM 100-10 MG/5ML PO SYRP
10.0000 mL | ORAL_SOLUTION | Freq: Four times a day (QID) | ORAL | Status: DC | PRN
  Administered 2018-09-09: 10 mL via ORAL
  Filled 2018-09-09: qty 10

## 2018-09-09 NOTE — Progress Notes (Signed)
Updated daughter Dewitt Rota

## 2018-09-09 NOTE — Evaluation (Signed)
Physical Therapy Evaluation Patient Details Name: Caitlyn [name] MRN: [id number] DOB: 1950-11-07 Today's Date: 09/09/2018   History of Present Illness  Caitlyn [name] is an 68 y.o. female w pmh sig for CAD, HTN, HL , to [facility] ED 6/16 for  fevers, chills, myalgia, body aches and sore throat.  Returned Home. On 6/24 patient had a syncopal spell, brought by EMS to ED. Poaitive for covid 6/16. .  Clinical Impression  The patient mobilizes well,  Noted DOE on % L HFNC, SaO2 dropped to855 while mobilizing from bed to recliner. Pt admitted with above diagnosis. Pt currently with functional limitations due to the deficits listed below (see PT Problem List).   Pt will benefit from skilled PT to increase their independence and safety with mobility to allow discharge to the venue listed below.       Follow Up Recommendations No PT follow up    Equipment Recommendations  None recommended by PT    Recommendations for Other Services       Precautions / Restrictions Precautions Precaution Comments: on HFNC      Mobility  Bed Mobility Overal bed mobility: Independent                Transfers   Equipment used: None Transfers: Sit to/from Stand Sit to Stand: Min guard            Ambulation/Gait Ambulation/Gait assistance: Min guard Gait Distance (Feet): 5 Feet Assistive device: None Gait Pattern/deviations: Step-through pattern     General Gait Details: just moved to recliner. ON 5 L HFNC with sats dropping to 85%.  Stairs            Wheelchair Mobility    Modified Rankin (Stroke Patients Only)       Balance                                             Pertinent Vitals/Pain      Home Living Family/patient expects to be discharged to:: Private residence Living Arrangements: Alone Available Help at Discharge: Family Type of Home: House Home Access: Level entry     [place]: [place]: None      Prior  Function Level of Independence: Independent               Hand Dominance        Extremity/Trunk Assessment   Upper Extremity Assessment Upper Extremity Assessment: Defer to OT evaluation    Lower Extremity Assessment Lower Extremity Assessment: Generalized weakness    Cervical / Trunk Assessment Cervical / Trunk Assessment: Normal  Communication   Communication: No difficulties  Cognition Arousal/Alertness: Awake/alert Behavior During Therapy: WFL for tasks assessed/performed Overall Cognitive Status: Within Functional Limits for tasks assessed                                        General Comments      Exercises     Assessment/Plan    PT Assessment Patient needs continued PT services  PT Problem List Decreased strength;Decreased mobility;Decreased activity tolerance;Cardiopulmonary status limiting activity;Decreased knowledge of use of DME       PT Treatment Interventions DME instruction;Gait training;Functional mobility training;Therapeutic exercise;Therapeutic activities;Patient/family education    PT Goals (Current goals can be  found in the Care Plan section)  Acute Rehab PT Goals Patient Stated Goal: to get better. PT Goal Formulation: With patient Time For Goal Achievement: 09/23/18 Potential to Achieve Goals: Good    Frequency Min 3X/week   Barriers to discharge        Co-evaluation               AM-PAC PT "6 Clicks" Mobility  Outcome Measure Help needed turning from your back to your side while in a flat bed without using bedrails?: None Help needed moving from lying on your back to sitting on the side of a flat bed without using bedrails?: None Help needed moving to and from a bed to a chair (including a wheelchair)?: None Help needed standing up from a chair using your arms (e.g., wheelchair or bedside chair)?: None Help needed to walk in hospital room?: A Little Help needed climbing 3-5 steps with a railing? : A  Little 6 Click Score: 22    End of Session Equipment Utilized During Treatment: Oxygen Activity Tolerance: Patient tolerated treatment well Patient left: in chair;with call bell/phone within reach;with nursing/sitter in room Nurse Communication: Mobility status PT Visit Diagnosis: Difficulty in walking, not elsewhere classified (R26.2);Unsteadiness on feet (R26.81)    Time: [id number] PT Time Calculation (min) (ACUTE ONLY): 8 min   Charges:   PT Evaluation $PT Eval Low Complexity: 1 Low          [phone]  Rada HayHill, Josie Mesa Elizabeth 09/09/2018, 10:25 AM

## 2018-09-09 NOTE — Progress Notes (Signed)
Initial Nutrition Assessment  RD working remotely.  DOCUMENTATION CODES:   Morbid obesity  INTERVENTION:   Ensure Enlive po BID, each supplement provides 350 kcal and 20 grams of protein   NUTRITION DIAGNOSIS:   Inadequate oral intake related to acute illness, decreased appetite as evidenced by per patient/family report.  GOAL:   Patient will meet greater than or equal to 90% of their needs   MONITOR:   PO intake, Supplement acceptance, Labs, Weight trends  REASON FOR ASSESSMENT:   Malnutrition Screening Tool    ASSESSMENT:   68 yo female diagnosed with SARS-CoV-2 one week ago but developed worsening symptoms including progressive weakness, no appetite, new onset hypoxia and admitted with respiratory failure. PMH includes CAD, HTN, HL, DM, anxiety/depression   Pt currently on HFNC 15 L/min Pt reports to significantly decreased po intake due to pt "not having an appetite."  No recorded po intake  Current wt 120.3 kg; no weight loss trend per weight encounters  Labs: CBG 174, potassium 3.3 Meds: ss novolog, solumedrol  NUTRITION - FOCUSED PHYSICAL EXAM:  Unable to assess, working remotely  Diet Order:   Diet Order            Diet heart healthy/carb modified Room service appropriate? Yes; Fluid consistency: Thin  Diet effective now              EDUCATION NEEDS:   Not appropriate for education at this time  Skin:  Skin Assessment: Reviewed RN Assessment  Last BM:  6/22  Height:   Ht Readings from Last 1 Encounters:  09/08/18 5\' 5"  (1.651 m)    Weight:   Wt Readings from Last 1 Encounters:  09/08/18 120.3 kg    Ideal Body Weight:  56.8 kg  BMI:  Body mass index is 44.13 kg/m.  Estimated Nutritional Needs:   Kcal:  1800-2000 kcals  Protein:  110-125 g  Fluid:  >/= 1.8 L   Cate Ivyonna Hoelzel MS, RDN, LDN, CNSC [phone] Pager  [phone] Weekend/On-Call Pager

## 2018-09-09 NOTE — Progress Notes (Signed)
Updated patient's daughter Dewitt Rota over the phone.

## 2018-09-09 NOTE — Evaluation (Signed)
Occupational Therapy Evaluation Patient Details Name: Caitlyn [name] MRN: [id number] DOB: 1951/02/21 Today's Date: 09/09/2018    History of Present Illness Caitlyn Moraleshyllis D Barret is an 68 y.o. female w pmh sig for CAD, HTN, HL , [facility] for  fevers, chills, myalgia, body aches and sore throat.  Returned Home. On 6/24 patient had a syncopal spell, brought by EMS to ED. Poaitive for covid 6/16. .   Clinical Impression   This 68 y/o female presents with the above. PTA pt reports independence with ADL, iADL and functional mobility. Pt presents with decreased respiratory status and activity tolerance this session. Pt currently requires setup assist for seated UB ADL, minguard-minA for LB ADL. Pt with notable DOE with standing activity, (2-3/4), on 10L HFNC with lowest sat noted 86% with activity. Pt lives alone but reports will have assist/support from family at time of discharge. She will benefit from continued acute OT services and [facility] to maximize her safety and independence with ADL and mobility. Will follow.     Follow Up Recommendations  Home health OT;Supervision/Assistance - 24 hour(24hr initially)    Equipment Recommendations  3 in 1 bedside commode(for use in shower; may require bariatric)           Precautions / Restrictions Precautions Precautions: Fall Precaution Comments: on HFNC Restrictions Weight Bearing Restrictions: No      Mobility Bed Mobility               General bed mobility comments: received OOB in recliner  Transfers Overall transfer level: Needs assistance Equipment used: None Transfers: Sit to/from Stand Sit to Stand: Min guard         General transfer comment: for balance/safety    Balance                                           ADL either performed or assessed with clinical judgement   ADL Overall ADL's : Needs assistance/impaired Eating/Feeding: Modified  independent;Sitting   Grooming: Set up;Oral care;Wash/dry face;Sitting   Upper Body Bathing: Min guard;Set up;Sitting   Lower Body Bathing: Min guard;Sit to/from stand   Upper Body Dressing : Set up;Minimal assistance;Sitting Upper Body Dressing Details (indicate cue type and reason): doffing/donning new gown Lower Body Dressing: Minimal assistance;Sit to/from stand   Toilet Transfer: DealerMin guard;Stand-pivot Toilet Transfer Details (indicate cue type and reason): simulated via transfer to/from recliner Toileting- Clothing Manipulation and Hygiene: Minimal assistance;Min guard;Sit to/from stand       Functional mobility during ADLs: Min guard General ADL Comments: pt mostly limited due to decreased activity tolerance and respiratory status                          Pertinent Vitals/Pain Pain Assessment: No/denies pain     Hand Dominance     Extremity/Trunk Assessment Upper Extremity Assessment Upper Extremity Assessment: Generalized weakness   Lower Extremity Assessment Lower Extremity Assessment: Defer to PT evaluation   Cervical / Trunk Assessment Cervical / Trunk Assessment: Normal   Communication Communication Communication: No difficulties   Cognition Arousal/Alertness: Awake/alert Behavior During Therapy: WFL for tasks assessed/performed Overall Cognitive Status: Within Functional Limits for tasks assessed  General Comments       Exercises     Shoulder Instructions      Home Living Family/patient expects to be discharged to:: Private residence Living Arrangements: Alone Available Help at Discharge: Family;Available PRN/intermittently Type of Home: House Home Access: Level entry     Home Layout: One level     Bathroom Shower/Tub: Teacher, early years/pre: Standard     Home Equipment: None          Prior Functioning/Environment Level of Independence: Independent         Comments: drives, performs iADL        OT Problem List: Decreased strength;Decreased activity tolerance;Impaired balance (sitting and/or standing);Decreased knowledge of use of DME or AE;Obesity;Cardiopulmonary status limiting activity;Decreased knowledge of precautions      OT Treatment/Interventions: Self-care/ADL training;Therapeutic exercise;DME and/or AE instruction;Therapeutic activities;Patient/family education;Balance training;Energy conservation    OT Goals(Current goals can be found in the care plan section) Acute Rehab OT Goals Patient Stated Goal: to get better. OT Goal Formulation: With patient Time For Goal Achievement: 09/23/18 Potential to Achieve Goals: Good  OT Frequency: Min 3X/week   Barriers to D/C:            Co-evaluation              AM-PAC OT "6 Clicks" Daily Activity     Outcome Measure Help from another person eating meals?: None Help from another person taking care of personal grooming?: None Help from another person toileting, which includes using toliet, bedpan, or urinal?: A Little Help from another person bathing (including washing, rinsing, drying)?: A Little Help from another person to put on and taking off regular upper body clothing?: None Help from another person to put on and taking off regular lower body clothing?: A Little 6 Click Score: 21   End of Session Equipment Utilized During Treatment: Oxygen Nurse Communication: Mobility status  Activity Tolerance: Patient tolerated treatment well Patient left: in chair;with call bell/phone within reach  OT Visit Diagnosis: Muscle weakness (generalized) (M62.81);Other (comment)(decreased activity tolerance)                Time: 7408-1448 OT Time Calculation (min): 37 min Charges:  OT General Charges $OT Visit: 1 Visit OT Evaluation $OT Eval Moderate Complexity: 1 Mod OT Treatments $Self Care/Home Management : 8-22 mins  Lou Cal, OT Supplemental Rehabilitation  Services Pager [phone] Office [phone]   Raymondo Band 09/09/2018, 4:54 PM

## 2018-09-09 NOTE — Progress Notes (Addendum)
Messaged sent to doctor on nights for med request for constipation.  Awaiting response.  2055  NO for Bisacodyl PRN

## 2018-09-09 NOTE — Progress Notes (Signed)
PROGRESS NOTE                                                                                                                                                                                                             Patient Demographics:    Caitlyn [name], is a 68 y.o. female, DOB - 04-18-1950, ZOX:[id number]  Outpatient Primary MD for the patient is Sandi Mariscal, MD    LOS - 1  Admit date - 09/08/2018    Chief Complaint  Patient presents with  . Loss of Consciousness       Brief Narrative 68 y.o. female w pmh sig for CAD, HTN, HL state of health until 11 days prior to admission when she had onset of fevers, chills, myalgia, body aches and sore throat.  She was seen at [facility] ER on June 16 for these complaints but was noted to be well-appearing on physical exam without acute distress.  Her O2 saturations were 99% on room air at that time.  Patient went home and continued to feel unwell with general malaise, anorexia, nonproductive but severe cough, remittent nausea and occasional diarrhea which is now resolved.  On the day of hospital admission she was walking in her house, feeling weak generally and subsequently found herself on the floor passed out with some injury on her face.  She was brought to the ER where her work-up was essentially unremarkable except for mild dehydration, hyponatremia and hypoxia she was placed on 6 L nasal cannula oxygen and admitted to the hospital.   Subjective:    Caitlyn [name] today has, No headache, No chest pain, No abdominal pain - No Nausea, No new weakness tingling or numbness, No Cough - Mild SOB.     Assessment  & Plan :     1. Acute Hypoxic Resp. Failure due to Acute Covid 19 Viral Pneumonitis during the ongoing 2020 Covid 19 Pandemic - she was started on IV steroids along with REMDESIVIR upon admission, still quite hypoxic on day 1 requiring high flow nasal cannula oxygen, will give her  Actemra as well on 09/09/2018.  Risks and benefits discussed with the patient she is agreeable for Actemra use, denies any history of TB or hepatitis.  She will be sitting in chair in the daytime, flutter valve and I-S have been ordered for pulmonary toiletry.  Will  monitor.  COVID-19 Labs  Recent Labs    09/08/18 1853 09/09/18 0455  DDIMER 1.39* 0.94*  FERRITIN 343* 342*  LDH 307* 272*  CRP 8.7* 9.4*    Lab Results  Component Value Date   SARSCOV2NAA DETECTED (A) 08/31/2018     Hepatic Function Latest Ref Rng & Units 09/09/2018 09/08/2018 01/13/2018  Total Protein 6.5 - 8.1 g/dL 7.0 7.3 7.8  Albumin 3.5 - 5.0 g/dL 3.0(L) 3.2(L) 4.6  AST 15 - 41 U/L 35 44(H) 23  ALT 0 - 44 U/L _0 Alk Phosphatase 38 - 126 U/L 54 56 106  Total Bilirubin 0.3 - 1.2 mg/dL 0.2(L) 0.7 0.2  Bilirubin, Direct 0.1 - 0.5 mg/dL - - -        Component Value Date/Time   BNP 56.3 09/09/2018 0455      2.  Syncope at home.  Likely due to dehydration induced by diarrhea, underlying diuretic use and poor oral intake causing orthostatic hypotension.  Currently stable on telemetry, EKG nonacute.  Will monitor..  By PT.  3.  Dehydration hyponatremia.  Diuretics on hold, with IV fluid hydration this problem has resolved.  Resume diuretics tomorrow.  4.  CAD.  Chest pain-free, EKG stable.  Continue dual antiplatelet therapy along with Coreg.  5.  Hypothyroidism.  Stable on Synthroid.  6.  DM type II.  Will add sliding scale insulin as she is on steroids.  Metformin on hold.  CBG (last 3)  Recent Labs    09/08/18 1035  GLUCAP 145*   Lab Results  Component Value Date   HGBA1C 5.7 (H) 01/13/2018      Condition - Extremely Guarded  Family Communication  :  None  Code Status :  Full  Diet :   Diet Order            Diet heart healthy/carb modified Room service appropriate? Yes; Fluid consistency: Thin  Diet effective now               Disposition Plan  :  Med  Consults  :  None   Procedures  :    CT Head/maxillofacial.  Nonacute  PUD Prophylaxis :   DVT Prophylaxis  :  Lovenox    Lab Results  Component Value Date   PLT 230 09/09/2018    Inpatient Medications  Scheduled Meds: . aspirin  81 mg Oral Daily  . atorvastatin  20 mg Oral Daily  . carvedilol  3.125 mg Oral BID WC  . clonazePAM  1 mg Oral QHS  . clopidogrel  75 mg Oral Daily  . enoxaparin (LOVENOX) injection  0.5 mg/kg Subcutaneous Q24H  . feeding supplement (ENSURE ENLIVE)  237 mL Oral BID BM  . levothyroxine  125 mcg Oral Q0600  . methylPREDNISolone (SOLU-MEDROL) injection  60 mg Intravenous Q6H   Continuous Infusions: . remdesivir 100 mg in NS 250 mL     PRN Meds:.acetaminophen, albuterol, guaiFENesin-dextromethorphan  Antibiotics  :    Anti-infectives (From admission, onward)   Start     Dose/Rate Route Frequency Ordered Stop   09/09/18 1900  remdesivir 100 mg in sodium chloride 0.9 % 250 mL IVPB     100 mg 500 mL/hr over 30 Minutes Intravenous Every 24 hours 09/08/18 1739 09/13/18 1859   09/08/18 1900  remdesivir 200 mg in sodium chloride 0.9 % 250 mL IVPB     200 mg 500 mL/hr over 30 Minutes Intravenous Once 09/08/18 1739 09/08/18 1955  Time Spent in minutes  30   Lala Lund M.D on 09/09/2018 at 10:34 AM  To page go to [email or web address] - password [facility]  [facility] -  Office  [phone]   See all Orders from today for further details    Objective:   Vitals:   09/09/18 0600 09/09/18 0748 09/09/18 0900 09/09/18 0901  BP:   (!) 148/68 133/74  Pulse:      Resp:      Temp:   98.4 F (36.9 C)   TempSrc:   Oral   SpO2: (!) 89% 91% 92%   Weight:      Height:        Wt Readings from Last 3 Encounters:  09/08/18 120.3 kg  08/31/18 120.2 kg  05/26/18 78.5 kg     Intake/Output Summary (Last 24 hours) at 09/09/2018 1034 Last data filed at 09/09/2018 0612 Gross per 24 hour  Intake 1157.63 ml  Output 1700 ml  Net -542.37 ml     Physical Exam   Awake Alert, Oriented X 3, No new F.N deficits, Normal affect [place].AT,PERRAL Supple Neck,No JVD, No cervical lymphadenopathy appriciated.  Symmetrical Chest wall movement, Good air movement bilaterally, CTAB RRR,No Gallops,Rubs or new Murmurs, No Parasternal Heave +ve B.Sounds, Abd Soft, No tenderness, No organomegaly appriciated, No rebound - guarding or rigidity. No Cyanosis, Clubbing or edema, No new Rash or bruise      Data Review:    CBC Recent Labs  Lab 09/08/18 1153 09/08/18 1853 09/09/18 0455  WBC 6.4 5.5 2.5*  HGB 13.3 13.0 12.4  HCT 39.8 38.7 37.3  PLT 209 213 230  MCV 90.9 91.9 92.3  MCH 30.4 30.9 30.7  MCHC 33.4 33.6 33.2  RDW 13.3 13.5 13.6  LYMPHSABS 0.3*  --  0.5*  MONOABS 0.3  --  0.1  EOSABS 0.0  --  0.0  BASOSABS 0.1  --  0.0    Chemistries  Recent Labs  Lab 09/08/18 1153 09/08/18 1853 09/09/18 0455  NA 133*  --  135  K 3.6  --  3.3*  CL 93*  --  98  CO2 29  --  27  GLUCOSE 129*  --  161*  BUN 15  --  15  CREATININE 0.87 0.86 0.74  CALCIUM 8.7*  --  8.4*  MG  --   --  2.3  AST 44*  --  35  ALT 29  --  26  ALKPHOS 56  --  54  BILITOT 0.7  --  0.2*   ------------------------------------------------------------------------------------------------------------------ No results for input(s): CHOL, HDL, LDLCALC, TRIG, CHOLHDL, LDLDIRECT in the last 72 hours.  Lab Results  Component Value Date   HGBA1C 5.7 (H) 01/13/2018   ------------------------------------------------------------------------------------------------------------------ No results for input(s): TSH, T4TOTAL, T3FREE, THYROIDAB in the last 72 hours.  Invalid input(s): FREET3  Cardiac Enzymes No results for input(s): CKMB, TROPONINI, MYOGLOBIN in the last 168 hours.  Invalid input(s): CK ------------------------------------------------------------------------------------------------------------------    Component Value Date/Time   BNP 56.3 09/09/2018 0455    Micro Results  Recent Results (from the past 240 hour(s))  Novel Coronavirus,NAA,(SEND-OUT TO REF LAB - TAT 24-48 hrs); Hosp Order     Status: Abnormal   Collection Time: 08/31/18  5:19 PM   Specimen: Nasopharyngeal Swab; Respiratory  Result Value Ref Range Status   SARS-CoV-2, NAA DETECTED (A) NOT DETECTED Final    Comment: RESULT CALLED TO, READ BACK BY AND VERIFIED WITH: Lelon Huh RN [phone] 09/01/18 A BROWNING (NOTE) This test was  developed and its performance characteristics determined by [employer]. This test has not been FDA cleared or approved. This test has been authorized by FDA under an Emergency Use Authorization (EUA). This test is only authorized for the duration of time the declaration that circumstances exist justifying the authorization of the emergency use of in vitro diagnostic tests for detection of SARS-CoV-2 virus and/or diagnosis of COVID-19 infection under section 564(b)(1) of the Act, 21 U.S.C. 517OHY-0(V)(3), unless the authorization is terminated or revoked sooner. When diagnostic testing is negative, the possibility of a false negative result should be considered in the context of a patient's recent exposures and the presence of clinical signs and symptoms consistent with COVID-19. An individual without symptoms of COVID-19 and who is not shedding SARS-CoV-2 v irus would expect to have a negative (not detected) result in this assay. Performed At: [facility] [address] [place], [place] [id number] Rush Farmer MD NI:[id number]    [place]  Final    Comment: Performed at [facility], [place] [address], [place], [place] [address]    Radiology Reports Ct Head Wo Contrast  Result Date: 09/08/2018 CLINICAL DATA:  Facial pain after syncope and fall today. EXAM: CT HEAD WITHOUT CONTRAST CT MAXILLOFACIAL WITHOUT CONTRAST TECHNIQUE: Multidetector CT imaging of the head and maxillofacial structures were performed using the standard  protocol without intravenous contrast. Multiplanar CT image reconstructions of the maxillofacial structures were also generated. COMPARISON:  None. FINDINGS: CT HEAD FINDINGS Brain: No evidence of acute infarction, hemorrhage, hydrocephalus, extra-axial collection or mass lesion/mass effect. Vascular: No hyperdense vessel or unexpected calcification. Skull: Normal. Negative for fracture or focal lesion. Other: None. CT MAXILLOFACIAL FINDINGS Osseous: No fracture or mandibular dislocation. No destructive process. Orbits: Negative. No traumatic or inflammatory finding. Sinuses: Clear. Soft tissues: Negative. IMPRESSION: Normal head CT. No definite abnormality seen in maxillofacial region. Electronically Signed   By: Marijo Conception M.D.   On: 09/08/2018 12:08   Dg Chest Port 1 View  Result Date: 09/08/2018 CLINICAL DATA:  Syncope. Hypoxia. COVID-19 positive. EXAM: PORTABLE CHEST 1 VIEW COMPARISON:  08/31/2018 FINDINGS: The cardiac silhouette remains mildly enlarged. The lungs are mildly hypoinflated. There is new airspace opacity in the left lung base, and there is milder patchy and streaky opacity in the right lung base. The interstitial markings are mildly increased bilaterally compared to the prior study. No sizable pleural effusion or pneumothorax is identified. Mild thoracic scoliosis is noted. IMPRESSION: New left basilar airspace opacity concerning for pneumonia. Additional right basilar pneumonia versus atelectasis. Electronically Signed   By: Logan Bores M.D.   On: 09/08/2018 11:35   Dg Chest Port 1 View  Result Date: 08/31/2018 CLINICAL DATA:  Fever, chills and chest pain today. Sore throat for 2-3 days. EXAM: PORTABLE CHEST 1 VIEW COMPARISON:  Portable chest 01/06/2016.  CT chest 11/07/2016. FINDINGS: 1702 hours. Stable cardiomegaly and mild aortic atherosclerosis. Probable mild left basilar atelectasis, exaggerated by overlying soft tissues. No edema, confluent airspace opacity, pleural effusion  or pneumothorax identified. The bones appear unremarkable. Telemetry leads overlie the chest. IMPRESSION: No suspected acute findings. Cardiomegaly with mild left basilar atelectasis. Electronically Signed   By: Richardean Sale M.D.   On: 08/31/2018 17:38   Ct Maxillofacial Wo Contrast  Result Date: 09/08/2018 CLINICAL DATA:  Facial pain after syncope and fall today. EXAM: CT HEAD WITHOUT CONTRAST CT MAXILLOFACIAL WITHOUT CONTRAST TECHNIQUE: Multidetector CT imaging of the head and maxillofacial structures were performed using the standard protocol without  intravenous contrast. Multiplanar CT image reconstructions of the maxillofacial structures were also generated. COMPARISON:  None. FINDINGS: CT HEAD FINDINGS Brain: No evidence of acute infarction, hemorrhage, hydrocephalus, extra-axial collection or mass lesion/mass effect. Vascular: No hyperdense vessel or unexpected calcification. Skull: Normal. Negative for fracture or focal lesion. Other: None. CT MAXILLOFACIAL FINDINGS Osseous: No fracture or mandibular dislocation. No destructive process. Orbits: Negative. No traumatic or inflammatory finding. Sinuses: Clear. Soft tissues: Negative. IMPRESSION: Normal head CT. No definite abnormality seen in maxillofacial region. Electronically Signed   By: Marijo Conception M.D.   On: 09/08/2018 12:08

## 2018-09-10 LAB — MAGNESIUM: Magnesium: 2.2 mg/dL (ref 1.7–2.4)

## 2018-09-10 LAB — CBC WITH DIFFERENTIAL/PLATELET
Abs Immature Granulocytes: 0.03 10*3/uL (ref 0.00–0.07)
Basophils Absolute: 0 10*3/uL (ref 0.0–0.1)
Basophils Relative: 0 %
Eosinophils Absolute: 0.2 10*3/uL (ref 0.0–0.5)
Eosinophils Relative: 2 %
HCT: 38.8 % (ref 36.0–46.0)
Hemoglobin: 12.9 g/dL (ref 12.0–15.0)
Immature Granulocytes: 0 %
Lymphocytes Relative: 9 %
Lymphs Abs: 0.7 10*3/uL (ref 0.7–4.0)
MCH: 30.8 pg (ref 26.0–34.0)
MCHC: 33.2 g/dL (ref 30.0–36.0)
MCV: 92.6 fL (ref 80.0–100.0)
Monocytes Absolute: 0.4 10*3/uL (ref 0.1–1.0)
Monocytes Relative: 5 %
Neutro Abs: 7 10*3/uL (ref 1.7–7.7)
Neutrophils Relative %: 84 %
Platelets: 290 10*3/uL (ref 150–400)
RBC: 4.19 MIL/uL (ref 3.87–5.11)
RDW: 13.5 % (ref 11.5–15.5)
WBC: 8.3 10*3/uL (ref 4.0–10.5)
nRBC: 0 % (ref 0.0–0.2)

## 2018-09-10 LAB — COMPREHENSIVE METABOLIC PANEL
ALT: 32 U/L (ref 0–44)
AST: 37 U/L (ref 15–41)
Albumin: 2.9 g/dL — ABNORMAL LOW (ref 3.5–5.0)
Alkaline Phosphatase: 51 U/L (ref 38–126)
Anion gap: 10 (ref 5–15)
BUN: 20 mg/dL (ref 8–23)
CO2: 27 mmol/L (ref 22–32)
Calcium: 8.6 mg/dL — ABNORMAL LOW (ref 8.9–10.3)
Chloride: 100 mmol/L (ref 98–111)
Creatinine, Ser: 0.77 mg/dL (ref 0.44–1.00)
GFR calc Af Amer: >60 mL/min (ref >60)
GFR calc non Af Amer: >60 mL/min (ref >60)
Glucose, Bld: 147 mg/dL — ABNORMAL HIGH (ref 70–99)
Potassium: 3.5 mmol/L (ref 3.5–5.1)
Sodium: 137 mmol/L (ref 135–145)
Total Bilirubin: 0.3 mg/dL (ref 0.3–1.2)
Total Protein: 6.9 g/dL (ref 6.5–8.1)

## 2018-09-10 LAB — GLUCOSE, CAPILLARY
Glucose-Capillary: 151 mg/dL — ABNORMAL HIGH (ref 70–99)
Glucose-Capillary: 175 mg/dL — ABNORMAL HIGH (ref 70–99)

## 2018-09-10 LAB — D-DIMER, QUANTITATIVE: D-Dimer, Quant: 0.83 ug/mL-FEU — ABNORMAL HIGH (ref 0.00–0.50)

## 2018-09-10 LAB — C-REACTIVE PROTEIN: CRP: 5.5 mg/dL — ABNORMAL HIGH (ref <1.0)

## 2018-09-10 LAB — FERRITIN: Ferritin: 414 ng/mL — ABNORMAL HIGH (ref 11–307)

## 2018-09-10 LAB — LACTATE DEHYDROGENASE: LDH: 303 U/L — ABNORMAL HIGH (ref 98–192)

## 2018-09-10 LAB — BRAIN NATRIURETIC PEPTIDE: B Natriuretic Peptide: 95.1 pg/mL (ref 0.0–100.0)

## 2018-09-10 LAB — PROCALCITONIN: Procalcitonin: <0.1 ng/mL

## 2018-09-10 MED ORDER — SALINE SPRAY 0.65 % NA SOLN
1.0000 | NASAL | Status: DC | PRN
  Administered 2018-09-10 – 2018-09-11 (×2): 1 via NASAL
  Filled 2018-09-10: qty 44

## 2018-09-10 MED ORDER — METHYLPREDNISOLONE SODIUM SUCC 125 MG IJ SOLR
60.0000 mg | Freq: Every day | INTRAMUSCULAR | Status: DC
  Administered 2018-09-11: 60 mg via INTRAVENOUS
  Filled 2018-09-10: qty 2

## 2018-09-10 MED ORDER — NITROGLYCERIN 2 % TD OINT
0.5000 [in_us] | TOPICAL_OINTMENT | Freq: Four times a day (QID) | TRANSDERMAL | Status: DC
  Administered 2018-09-10: 0.5 [in_us] via TOPICAL
  Filled 2018-09-10: qty 30

## 2018-09-10 MED ORDER — LIP MEDEX EX OINT
TOPICAL_OINTMENT | CUTANEOUS | Status: DC | PRN
  Filled 2018-09-10: qty 7

## 2018-09-10 MED ORDER — FUROSEMIDE 10 MG/ML IJ SOLN
40.0000 mg | Freq: Once | INTRAMUSCULAR | Status: AC
  Administered 2018-09-10: 40 mg via INTRAVENOUS
  Filled 2018-09-10: qty 4

## 2018-09-10 NOTE — Progress Notes (Signed)
Updated daughter Dewitt Rota over the phone.

## 2018-09-10 NOTE — Progress Notes (Signed)
Patient's oxygen saturation dropped in to the 70's and it required oxygen requirements to increase to 15 liters in order to get oxygen above 90 percent.  Patient asked for nitroglycerin patch to be taken off, because she said she was feeling weak.  Dr. Candiss Norse notified, and he requested that night shift be aware that she will need increased oxygen requirements while moving.  Patient initially short of breath but responded to albuterol inhaler.

## 2018-09-10 NOTE — Progress Notes (Signed)
PROGRESS NOTE                                                                                                                                                                                                             Patient Demographics:    Caitlyn [name], is a 68 y.o. female, DOB - 09/30/1950, IWL:[id number]  Outpatient Primary MD for the patient is Sandi Mariscal, MD    LOS - 2  Admit date - 09/08/2018    Chief Complaint  Patient presents with   Loss of Consciousness       Brief Narrative 68 y.o. female w pmh sig for CAD, HTN, HL state of health until 11 days prior to admission when she had onset of fevers, chills, myalgia, body aches and sore throat.  She was seen at [facility] ER on June 16 for these complaints but was noted to be well-appearing on physical exam without acute distress.  Her O2 saturations were 99% on room air at that time.  Patient went home and continued to feel unwell with general malaise, anorexia, nonproductive but severe cough, remittent nausea and occasional diarrhea which is now resolved.  On the day of hospital admission she was walking in her house, feeling weak generally and subsequently found herself on the floor passed out with some injury on her face.  She was brought to the ER where her work-up was essentially unremarkable except for mild dehydration, hyponatremia and hypoxia she was placed on 6 L nasal cannula oxygen and admitted to the hospital.   Subjective:   Patient in bed, appears comfortable, denies any headache, no fever, no chest pain or pressure, positive orthopnea and shortness of breath , no abdominal pain. No focal weakness.   Assessment  & Plan :     1. Acute Hypoxic Resp. Failure due to Acute Covid 19 Viral Pneumonitis during the ongoing 2020 Covid 19 Pandemic - she was started on IV steroids along with REMDESIVIR upon admission followed by Actemra on 09/09/2018, hypoxia has improved  but still on 6lit high flow nasal cannula oxygen.  Will gently diurese on 09/10/2018.  She will be sitting in chair in the daytime, flutter valve and I-S have been ordered for pulmonary toiletry.  Will monitor.  Inflammatory markers have stabilized.  [employer]    09/08/18 1853 09/09/18 0455  09/10/18 0300  DDIMER 1.39* 0.94* 0.83*  FERRITIN 343* 342* 414*  LDH 307* 272* 303*  CRP 8.7* 9.4* 5.5*    Lab Results  Component Value Date   SARSCOV2NAA DETECTED (A) 08/31/2018     Hepatic Function Latest Ref Rng & Units 09/10/2018 09/09/2018 09/08/2018  Total Protein 6.5 - 8.1 g/dL 6.9 7.0 7.3  Albumin 3.5 - 5.0 g/dL 2.9(L) 3.0(L) 3.2(L)  AST 15 - 41 U/L 37 35 44(H)  ALT 0 - 44 U/L 32 26 29  Alk Phosphatase 38 - 126 U/L 51 54 56  Total Bilirubin 0.3 - 1.2 mg/dL 0.3 0.2(L) 0.7  Bilirubin, Direct 0.1 - 0.5 mg/dL - - -        Component Value Date/Time   BNP 95.1 09/10/2018 0300      2.  Syncope at home.  Likely due to dehydration induced by diarrhea, underlying diuretic use and poor oral intake causing orthostatic hypotension.  Currently stable on telemetry, EKG nonacute.  Will monitor..  By PT.  3.  Dehydration hyponatremia.  Resolved after hydration and holding of Lasix, now evidence of mild fluid overload and Rales, resume Lasix on 09/10/2018 and monitor.  Address Lasix dose daily.  4.  CAD.  Chest pain-free, EKG stable.  Continue dual antiplatelet therapy along with Coreg.  5.  Hypothyroidism.  Stable on Synthroid.  6.  DM type II.  Will add sliding scale insulin as she is on steroids.  Metformin on hold.  CBG (last 3)  Recent Labs    09/09/18 1650 09/09/18 2024 09/10/18 0752  GLUCAP 174* 164* 151*   Lab Results  Component Value Date   HGBA1C 6.0 (H) 09/09/2018      Condition - Extremely Guarded  Family Communication  :  None  Code Status :  Full  Diet :   Diet Order            Diet heart healthy/carb modified Room service appropriate? Yes;  Fluid consistency: Thin  Diet effective now               Disposition Plan  :  Med  Consults  :  None  Procedures  :    CT Head/maxillofacial.  Nonacute  PUD Prophylaxis :   DVT Prophylaxis  :  Lovenox    Lab Results  Component Value Date   PLT 290 09/10/2018    Inpatient Medications  Scheduled Meds:  aspirin  81 mg Oral Daily   atorvastatin  20 mg Oral Daily   carvedilol  3.125 mg Oral BID WC   clonazePAM  1 mg Oral QHS   clopidogrel  75 mg Oral Daily   enoxaparin (LOVENOX) injection  0.5 mg/kg Subcutaneous Q24H   feeding supplement (ENSURE ENLIVE)  237 mL Oral BID BM   insulin aspart  0-5 Units Subcutaneous QHS   insulin aspart  0-9 Units Subcutaneous TID WC   levothyroxine  125 mcg Oral Q0600   methylPREDNISolone (SOLU-MEDROL) injection  60 mg Intravenous Q6H   Continuous Infusions:  remdesivir 100 mg in NS 250 mL Stopped (09/09/18 2017)   PRN Meds:.acetaminophen, albuterol, bisacodyl, guaiFENesin-dextromethorphan  Antibiotics  :    Anti-infectives (From admission, onward)   Start     Dose/Rate Route Frequency Ordered Stop   09/09/18 1900  remdesivir 100 mg in sodium chloride 0.9 % 250 mL IVPB     100 mg 500 mL/hr over 30 Minutes Intravenous Every 24 hours 09/08/18 1739 09/13/18 1859   09/08/18 1900  remdesivir  200 mg in sodium chloride 0.9 % 250 mL IVPB     200 mg 500 mL/hr over 30 Minutes Intravenous Once 09/08/18 1739 09/08/18 1955       Time Spent in minutes  30   Lala Lund M.D on 09/10/2018 at 10:40 AM  To page go to [email or web address] - password [email or web address]  [facility] -  Office  [phone]   See all Orders from today for further details    Objective:   Vitals:   09/09/18 2352 09/10/18 0419 09/10/18 0828 09/10/18 0830  BP: (!) 157/58 (!) 146/68 (!) 142/62 (!) 151/77  Pulse: 72 65 (!) 58 (!) 58  Resp: 18 (!) 22 20   Temp: 98.5 F (36.9 C) 98.1 F (36.7 C) 97.9 F (36.6 C)   TempSrc: Oral Oral Oral   SpO2: 90% 94%  90%   Weight:      Height:        Wt Readings from Last 3 Encounters:  09/08/18 120.3 kg  08/31/18 120.2 kg  05/26/18 78.5 kg     Intake/Output Summary (Last 24 hours) at 09/10/2018 1040 Last data filed at 09/10/2018 0431 Gross per 24 hour  Intake 970 ml  Output 2225 ml  Net -1255 ml     Physical Exam   Awake Alert, Oriented X 3, No new F.N deficits, Normal affect [place].AT,PERRAL Supple Neck,No JVD, No cervical lymphadenopathy appriciated.  Symmetrical Chest wall movement, Good air movement bilaterally, +ve rales RRR,No Gallops, Rubs or new Murmurs, No Parasternal Heave +ve B.Sounds, Abd Soft, No tenderness, No organomegaly appriciated, No rebound - guarding or rigidity. No Cyanosis, Clubbing or edema, No new Rash or bruise    Data Review:    CBC Recent Labs  Lab 09/08/18 1153 09/08/18 1853 09/09/18 0455 09/10/18 0300  WBC 6.4 5.5 2.5* 8.3  HGB 13.3 13.0 12.4 12.9  HCT 39.8 38.7 37.3 38.8  PLT 209 213 230 290  MCV 90.9 91.9 92.3 92.6  MCH 30.4 30.9 30.7 30.8  MCHC 33.4 33.6 33.2 33.2  RDW 13.3 13.5 13.6 13.5  LYMPHSABS 0.3*  --  0.5* 0.7  MONOABS 0.3  --  0.1 0.4  EOSABS 0.0  --  0.0 0.2  BASOSABS 0.1  --  0.0 0.0    Chemistries  Recent Labs  Lab 09/08/18 1153 09/08/18 1853 09/09/18 0455 09/10/18 0300  NA 133*  --  135 137  K 3.6  --  3.3* 3.5  CL 93*  --  98 100  CO2 29  --  27 27  GLUCOSE 129*  --  161* 147*  BUN 15  --  15 20  CREATININE 0.87 0.86 0.74 0.77  CALCIUM 8.7*  --  8.4* 8.6*  MG  --   --  2.3 2.2  AST 44*  --  35 37  ALT 29  --  26 32  ALKPHOS 56  --  54 51  BILITOT 0.7  --  0.2* 0.3   ------------------------------------------------------------------------------------------------------------------ No results for input(s): CHOL, HDL, LDLCALC, TRIG, CHOLHDL, LDLDIRECT in the last 72 hours.  Lab Results  Component Value Date   HGBA1C 6.0 (H) 09/09/2018    ------------------------------------------------------------------------------------------------------------------ No results for input(s): TSH, T4TOTAL, T3FREE, THYROIDAB in the last 72 hours.  Invalid input(s): FREET3  Cardiac Enzymes No results for input(s): CKMB, TROPONINI, MYOGLOBIN in the last 168 hours.  Invalid input(s): CK ------------------------------------------------------------------------------------------------------------------    Component Value Date/Time   BNP 95.1 09/10/2018 0300    Micro Results Recent Results (from the  past 240 hour(s))  Novel Coronavirus,NAA,(SEND-OUT TO REF LAB - TAT 24-48 hrs); Hosp Order     Status: Abnormal   Collection Time: 08/31/18  5:19 PM   Specimen: Nasopharyngeal Swab; Respiratory  Result Value Ref Range Status   SARS-CoV-2, NAA DETECTED (A) NOT DETECTED Final    Comment: RESULT CALLED TO, READ BACK BY AND VERIFIED WITH: Lelon Huh RN [phone] 09/01/18 A BROWNING (NOTE) This test was developed and its performance characteristics determined by [employer]. This test has not been FDA cleared or approved. This test has been authorized by FDA under an Emergency Use Authorization (EUA). This test is only authorized for the duration of time the declaration that circumstances exist justifying the authorization of the emergency use of in vitro diagnostic tests for detection of SARS-CoV-2 virus and/or diagnosis of COVID-19 infection under section 564(b)(1) of the Act, 21 U.S.C. 644IHK-7(Q)(2), unless the authorization is terminated or revoked sooner. When diagnostic testing is negative, the possibility of a false negative result should be considered in the context of a patient's recent exposures and the presence of clinical signs and symptoms consistent with COVID-19. An individual without symptoms of COVID-19 and who is not shedding SARS-CoV-2 v irus would expect to have a negative (not detected) result in this assay. Performed  At: [facility] [address] [place], [place] [id number] Rush Farmer MD [id number]    [place]  Final    Comment: Performed at [facility], [place] [address], [place], [place] [address]    Radiology Reports Ct Head Wo Contrast  Result Date: 09/08/2018 CLINICAL DATA:  Facial pain after syncope and fall today. EXAM: CT HEAD WITHOUT CONTRAST CT MAXILLOFACIAL WITHOUT CONTRAST TECHNIQUE: Multidetector CT imaging of the head and maxillofacial structures were performed using the standard protocol without intravenous contrast. Multiplanar CT image reconstructions of the maxillofacial structures were also generated. COMPARISON:  None. FINDINGS: CT HEAD FINDINGS Brain: No evidence of acute infarction, hemorrhage, hydrocephalus, extra-axial collection or mass lesion/mass effect. Vascular: No hyperdense vessel or unexpected calcification. Skull: Normal. Negative for fracture or focal lesion. Other: None. CT MAXILLOFACIAL FINDINGS Osseous: No fracture or mandibular dislocation. No destructive process. Orbits: Negative. No traumatic or inflammatory finding. Sinuses: Clear. Soft tissues: Negative. IMPRESSION: Normal head CT. No definite abnormality seen in maxillofacial region. Electronically Signed   By: Marijo Conception M.D.   On: 09/08/2018 12:08   Dg Chest Port 1 View  Result Date: 09/08/2018 CLINICAL DATA:  Syncope. Hypoxia. COVID-19 positive. EXAM: PORTABLE CHEST 1 VIEW COMPARISON:  08/31/2018 FINDINGS: The cardiac silhouette remains mildly enlarged. The lungs are mildly hypoinflated. There is new airspace opacity in the left lung base, and there is milder patchy and streaky opacity in the right lung base. The interstitial markings are mildly increased bilaterally compared to the prior study. No sizable pleural effusion or pneumothorax is identified. Mild thoracic scoliosis is noted. IMPRESSION: New left basilar airspace opacity concerning for pneumonia. Additional  right basilar pneumonia versus atelectasis. Electronically Signed   By: Logan Bores M.D.   On: 09/08/2018 11:35   Dg Chest Port 1 View  Result Date: 08/31/2018 CLINICAL DATA:  Fever, chills and chest pain today. Sore throat for 2-3 days. EXAM: PORTABLE CHEST 1 VIEW COMPARISON:  Portable chest 01/06/2016.  CT chest 11/07/2016. FINDINGS: 1702 hours. Stable cardiomegaly and mild aortic atherosclerosis. Probable mild left basilar atelectasis, exaggerated by overlying soft tissues. No edema, confluent airspace opacity, pleural effusion or pneumothorax identified. The bones appear unremarkable. Telemetry leads  overlie the chest. IMPRESSION: No suspected acute findings. Cardiomegaly with mild left basilar atelectasis. Electronically Signed   By: Richardean Sale M.D.   On: 08/31/2018 17:38   Ct Maxillofacial Wo Contrast  Result Date: 09/08/2018 CLINICAL DATA:  Facial pain after syncope and fall today. EXAM: CT HEAD WITHOUT CONTRAST CT MAXILLOFACIAL WITHOUT CONTRAST TECHNIQUE: Multidetector CT imaging of the head and maxillofacial structures were performed using the standard protocol without intravenous contrast. Multiplanar CT image reconstructions of the maxillofacial structures were also generated. COMPARISON:  None. FINDINGS: CT HEAD FINDINGS Brain: No evidence of acute infarction, hemorrhage, hydrocephalus, extra-axial collection or mass lesion/mass effect. Vascular: No hyperdense vessel or unexpected calcification. Skull: Normal. Negative for fracture or focal lesion. Other: None. CT MAXILLOFACIAL FINDINGS Osseous: No fracture or mandibular dislocation. No destructive process. Orbits: Negative. No traumatic or inflammatory finding. Sinuses: Clear. Soft tissues: Negative. IMPRESSION: Normal head CT. No definite abnormality seen in maxillofacial region. Electronically Signed   By: Marijo Conception M.D.   On: 09/08/2018 12:08

## 2018-09-11 LAB — CBC WITH DIFFERENTIAL/PLATELET
Abs Immature Granulocytes: 0.09 10*3/uL — ABNORMAL HIGH (ref 0.00–0.07)
Basophils Absolute: 0 10*3/uL (ref 0.0–0.1)
Basophils Relative: 0 %
Eosinophils Absolute: 0 10*3/uL (ref 0.0–0.5)
Eosinophils Relative: 0 %
HCT: 40 % (ref 36.0–46.0)
Hemoglobin: 13 g/dL (ref 12.0–15.0)
Immature Granulocytes: 1 %
Lymphocytes Relative: 11 %
Lymphs Abs: 1.2 10*3/uL (ref 0.7–4.0)
MCH: 30.4 pg (ref 26.0–34.0)
MCHC: 32.5 g/dL (ref 30.0–36.0)
MCV: 93.5 fL (ref 80.0–100.0)
Monocytes Absolute: 0.8 10*3/uL (ref 0.1–1.0)
Monocytes Relative: 8 %
Neutro Abs: 8.9 10*3/uL — ABNORMAL HIGH (ref 1.7–7.7)
Neutrophils Relative %: 80 %
Platelets: 356 10*3/uL (ref 150–400)
RBC: 4.28 MIL/uL (ref 3.87–5.11)
RDW: 13.7 % (ref 11.5–15.5)
WBC: 11 10*3/uL — ABNORMAL HIGH (ref 4.0–10.5)
nRBC: 0 % (ref 0.0–0.2)

## 2018-09-11 LAB — COMPREHENSIVE METABOLIC PANEL
ALT: 60 U/L — ABNORMAL HIGH (ref 0–44)
AST: 53 U/L — ABNORMAL HIGH (ref 15–41)
Albumin: 3 g/dL — ABNORMAL LOW (ref 3.5–5.0)
Alkaline Phosphatase: 51 U/L (ref 38–126)
Anion gap: 12 (ref 5–15)
BUN: 27 mg/dL — ABNORMAL HIGH (ref 8–23)
CO2: 28 mmol/L (ref 22–32)
Calcium: 8.6 mg/dL — ABNORMAL LOW (ref 8.9–10.3)
Chloride: 98 mmol/L (ref 98–111)
Creatinine, Ser: 0.91 mg/dL (ref 0.44–1.00)
GFR calc Af Amer: >60 mL/min (ref >60)
GFR calc non Af Amer: >60 mL/min (ref >60)
Glucose, Bld: 118 mg/dL — ABNORMAL HIGH (ref 70–99)
Potassium: 3.5 mmol/L (ref 3.5–5.1)
Sodium: 138 mmol/L (ref 135–145)
Total Bilirubin: 0.2 mg/dL — ABNORMAL LOW (ref 0.3–1.2)
Total Protein: 6.6 g/dL (ref 6.5–8.1)

## 2018-09-11 LAB — FERRITIN: Ferritin: 394 ng/mL — ABNORMAL HIGH (ref 11–307)

## 2018-09-11 LAB — D-DIMER, QUANTITATIVE: D-Dimer, Quant: 0.73 ug/mL-FEU — ABNORMAL HIGH (ref 0.00–0.50)

## 2018-09-11 LAB — MAGNESIUM: Magnesium: 2.2 mg/dL (ref 1.7–2.4)

## 2018-09-11 LAB — LACTATE DEHYDROGENASE: LDH: 317 U/L — ABNORMAL HIGH (ref 98–192)

## 2018-09-11 LAB — GLUCOSE, CAPILLARY
Glucose-Capillary: 110 mg/dL — ABNORMAL HIGH (ref 70–99)
Glucose-Capillary: 134 mg/dL — ABNORMAL HIGH (ref 70–99)
Glucose-Capillary: 171 mg/dL — ABNORMAL HIGH (ref 70–99)
Glucose-Capillary: 152 mg/dL — ABNORMAL HIGH (ref 70–99)

## 2018-09-11 LAB — PROCALCITONIN: Procalcitonin: <0.1 ng/mL

## 2018-09-11 LAB — BRAIN NATRIURETIC PEPTIDE: B Natriuretic Peptide: 78.6 pg/mL (ref 0.0–100.0)

## 2018-09-11 LAB — C-REACTIVE PROTEIN: CRP: 2.3 mg/dL — ABNORMAL HIGH (ref <1.0)

## 2018-09-11 MED ORDER — FUROSEMIDE 20 MG PO TABS
40.0000 mg | ORAL_TABLET | Freq: Once | ORAL | Status: AC
  Administered 2018-09-12: 40 mg via ORAL
  Filled 2018-09-11: qty 2

## 2018-09-11 NOTE — Progress Notes (Signed)
PROGRESS NOTE                                                                                                                                                                                                             Patient Demographics:    Caitlyn [name], is a 68 y.o. female, DOB - 1950-11-14, BOF:[id number]  Outpatient Primary MD for the patient is Sandi Mariscal, MD    LOS - 3  Admit date - 09/08/2018    Chief Complaint  Patient presents with  . Loss of Consciousness       Brief Narrative 68 y.o. female w pmh sig for CAD, HTN, HL state of health until 11 days prior to admission when she had onset of fevers, chills, myalgia, body aches and sore throat.  She was seen at [facility] on June 16 for these complaints but was noted to be well-appearing on physical exam without acute distress.  Her O2 saturations were 99% on room air at that time.  Patient went home and continued to feel unwell with general malaise, anorexia, nonproductive but severe cough, remittent nausea and occasional diarrhea which is now resolved.  On the day of hospital admission she was walking in her house, feeling weak generally and subsequently found herself on the floor passed out with some injury on her face.  She was brought to the ER where her work-up was essentially unremarkable except for mild dehydration, hyponatremia and hypoxia she was placed on 6 L nasal cannula oxygen and admitted to the hospital.   Subjective:   Patient in bed, appears comfortable, denies any headache, no fever, no chest pain or pressure, no shortness of breath , no abdominal pain. No focal weakness.    Assessment  & Plan :     1. Acute Hypoxic Resp. Failure due to Acute Covid 19 Viral Pneumonitis during the ongoing 2020 Covid 19 Pandemic - she was started on IV steroids along with REMDESIVIR upon admission followed by Actemra on 09/09/2018, hypoxia has improved but still on 6lit  high flow nasal cannula oxygen.  Will gently diurese on 09/10/2018.  She will be sitting in chair in the daytime, flutter valve and I-S have been ordered for pulmonary toiletry.  Will monitor.  Inflammatory markers have stabilized.  [place]    09/09/18 0455 09/10/18 0300 09/11/18  0425  DDIMER 0.94* 0.83* 0.73*  FERRITIN 342* 414* 394*  LDH 272* 303* 317*  CRP 9.4* 5.5* 2.3*    Lab Results  Component Value Date   SARSCOV2NAA DETECTED (A) 08/31/2018     Hepatic Function Latest Ref Rng & Units 09/11/2018 09/10/2018 09/09/2018  Total Protein 6.5 - 8.1 g/dL 6.6 6.9 7.0  Albumin 3.5 - 5.0 g/dL 3.0(L) 2.9(L) 3.0(L)  AST 15 - 41 U/L 53(H) 37 35  ALT 0 - 44 U/L 60(H) 32 26  Alk Phosphatase 38 - 126 U/L 51 51 54  Total Bilirubin 0.3 - 1.2 mg/dL 0.2(L) 0.3 0.2(L)  Bilirubin, Direct 0.1 - 0.5 mg/dL - - -        Component Value Date/Time   BNP 78.6 09/11/2018 0425      2.  Syncope at home.  Likely due to dehydration induced by diarrhea, underlying diuretic use and poor oral intake causing orthostatic hypotension.  Currently stable on telemetry, EKG nonacute.  Will monitor..  By PT.  3.  Dehydration hyponatremia.  Resolved after hydration and holding of Lasix, did develop some orthopnea and fluid overload on 09/10/2018 which has resolved after a dose of IV Lasix, now intermittent Lasix every other day orally.  4.  CAD.  Chest pain-free, EKG stable.  Continue dual antiplatelet therapy along with Coreg.  5.  Hypothyroidism.  Stable on Synthroid.  6.  DM type II.  Will add sliding scale insulin as she is on steroids.  Metformin on hold.  CBG (last 3)  Recent Labs    09/10/18 0752 09/10/18 1647 09/11/18 0839  GLUCAP 151* 175* 110*   Lab Results  Component Value Date   HGBA1C 6.0 (H) 09/09/2018      Condition - Extremely Guarded  Family Communication  :  None  Code Status :  Full  Diet :   Diet Order            Diet heart healthy/carb modified Room  service appropriate? Yes; Fluid consistency: Thin  Diet effective now               Disposition Plan  : Home in 1 to 2 days  Consults  :  None  Procedures  :    CT Head/maxillofacial.  Nonacute  PUD Prophylaxis :   DVT Prophylaxis  :  Lovenox    Lab Results  Component Value Date   PLT 356 09/11/2018    Inpatient Medications  Scheduled Meds: . aspirin  81 mg Oral Daily  . atorvastatin  20 mg Oral Daily  . carvedilol  3.125 mg Oral BID WC  . clonazePAM  1 mg Oral QHS  . clopidogrel  75 mg Oral Daily  . enoxaparin (LOVENOX) injection  0.5 mg/kg Subcutaneous Q24H  . feeding supplement (ENSURE ENLIVE)  237 mL Oral BID BM  . insulin aspart  0-5 Units Subcutaneous QHS  . insulin aspart  0-9 Units Subcutaneous TID WC  . levothyroxine  125 mcg Oral Q0600  . methylPREDNISolone (SOLU-MEDROL) injection  60 mg Intravenous Daily   Continuous Infusions: . remdesivir 100 mg in NS 250 mL 100 mg (09/10/18 1803)   PRN Meds:.acetaminophen, albuterol, bisacodyl, guaiFENesin-dextromethorphan, lip balm, sodium chloride  Antibiotics  :    Anti-infectives (From admission, onward)   Start     Dose/Rate Route Frequency Ordered Stop   09/09/18 1900  remdesivir 100 mg in sodium chloride 0.9 % 250 mL IVPB     100 mg 500 mL/hr over 30 Minutes  Intravenous Every 24 hours 09/08/18 1739 09/13/18 1859   09/08/18 1900  remdesivir 200 mg in sodium chloride 0.9 % 250 mL IVPB     200 mg 500 mL/hr over 30 Minutes Intravenous Once 09/08/18 1739 09/08/18 1955       Time Spent in minutes  30   Lala Lund M.D on 09/11/2018 at 10:22 AM  To page go to [email or web address] - password [facility]  [facility] -  Office  [phone]   See all Orders from today for further details    Objective:   Vitals:   09/10/18 2206 09/11/18 0436 09/11/18 0600 09/11/18 0632  BP: (!) 155/74 123/61    Pulse: 61 62    Resp: 20 (!) 22    Temp: 97.9 F (36.6 C) 97.7 F (36.5 C)    TempSrc: Oral Oral    SpO2:  96% 95% 100% 100%  Weight:      Height:        Wt Readings from Last 3 Encounters:  09/08/18 120.3 kg  08/31/18 120.2 kg  05/26/18 78.5 kg     Intake/Output Summary (Last 24 hours) at 09/11/2018 1022 Last data filed at 09/11/2018 0800 Gross per 24 hour  Intake 1500 ml  Output 3301 ml  Net -1801 ml     Physical Exam  Awake Alert, Oriented X 3, No new F.N deficits, Normal affect [place].AT,PERRAL Supple Neck,No JVD, No cervical lymphadenopathy appriciated.  Symmetrical Chest wall movement, Good air movement bilaterally, CTAB RRR,No Gallops, Rubs or new Murmurs, No Parasternal Heave +ve B.Sounds, Abd Soft, No tenderness, No organomegaly appriciated, No rebound - guarding or rigidity. No Cyanosis, Clubbing or edema, No new Rash or bruise   Data Review:    CBC Recent Labs  Lab 09/08/18 1153 09/08/18 1853 09/09/18 0455 09/10/18 0300 09/11/18 0425  WBC 6.4 5.5 2.5* 8.3 11.0*  HGB 13.3 13.0 12.4 12.9 13.0  HCT 39.8 38.7 37.3 38.8 40.0  PLT 209 213 230 290 356  MCV 90.9 91.9 92.3 92.6 93.5  MCH 30.4 30.9 30.7 30.8 30.4  MCHC 33.4 33.6 33.2 33.2 32.5  RDW 13.3 13.5 13.6 13.5 13.7  LYMPHSABS 0.3*  --  0.5* 0.7 1.2  MONOABS 0.3  --  0.1 0.4 0.8  EOSABS 0.0  --  0.0 0.2 0.0  BASOSABS 0.1  --  0.0 0.0 0.0    Chemistries  Recent Labs  Lab 09/08/18 1153 09/08/18 1853 09/09/18 0455 09/10/18 0300 09/11/18 0425  NA 133*  --  135 137 138  K 3.6  --  3.3* 3.5 3.5  CL 93*  --  98 100 98  CO2 29  --  '27 27 28  ' GLUCOSE 129*  --  161* 147* 118*  BUN 15  --  15 20 27*  CREATININE 0.87 0.86 0.74 0.77 0.91  CALCIUM 8.7*  --  8.4* 8.6* 8.6*  MG  --   --  2.3 2.2 2.2  AST 44*  --  35 37 53*  ALT 29  --  26 32 60*  ALKPHOS 56  --  54 51 51  BILITOT 0.7  --  0.2* 0.3 0.2*   ------------------------------------------------------------------------------------------------------------------ No results for input(s): CHOL, HDL, LDLCALC, TRIG, CHOLHDL, LDLDIRECT in the last 72 hours.   Lab Results  Component Value Date   HGBA1C 6.0 (H) 09/09/2018   ------------------------------------------------------------------------------------------------------------------ No results for input(s): TSH, T4TOTAL, T3FREE, THYROIDAB in the last 72 hours.  Invalid input(s): FREET3  Cardiac Enzymes No results for input(s): CKMB, TROPONINI,  MYOGLOBIN in the last 168 hours.  Invalid input(s): CK ------------------------------------------------------------------------------------------------------------------    Component Value Date/Time   BNP 78.6 09/11/2018 0425    Micro Results No results found for this or any previous visit (from the past 240 hour(s)).  Radiology Reports Ct Head Wo Contrast  Result Date: 09/08/2018 CLINICAL DATA:  Facial pain after syncope and fall today. EXAM: CT HEAD WITHOUT CONTRAST CT MAXILLOFACIAL WITHOUT CONTRAST TECHNIQUE: Multidetector CT imaging of the head and maxillofacial structures were performed using the standard protocol without intravenous contrast. Multiplanar CT image reconstructions of the maxillofacial structures were also generated. COMPARISON:  None. FINDINGS: CT HEAD FINDINGS Brain: No evidence of acute infarction, hemorrhage, hydrocephalus, extra-axial collection or mass lesion/mass effect. Vascular: No hyperdense vessel or unexpected calcification. Skull: Normal. Negative for fracture or focal lesion. Other: None. CT MAXILLOFACIAL FINDINGS Osseous: No fracture or mandibular dislocation. No destructive process. Orbits: Negative. No traumatic or inflammatory finding. Sinuses: Clear. Soft tissues: Negative. IMPRESSION: Normal head CT. No definite abnormality seen in maxillofacial region. Electronically Signed   By: Marijo Conception M.D.   On: 09/08/2018 12:08   Dg Chest Port 1 View  Result Date: 09/08/2018 CLINICAL DATA:  Syncope. Hypoxia. COVID-19 positive. EXAM: PORTABLE CHEST 1 VIEW COMPARISON:  08/31/2018 FINDINGS: The cardiac silhouette  remains mildly enlarged. The lungs are mildly hypoinflated. There is new airspace opacity in the left lung base, and there is milder patchy and streaky opacity in the right lung base. The interstitial markings are mildly increased bilaterally compared to the prior study. No sizable pleural effusion or pneumothorax is identified. Mild thoracic scoliosis is noted. IMPRESSION: New left basilar airspace opacity concerning for pneumonia. Additional right basilar pneumonia versus atelectasis. Electronically Signed   By: Logan Bores M.D.   On: 09/08/2018 11:35   Dg Chest Port 1 View  Result Date: 08/31/2018 CLINICAL DATA:  Fever, chills and chest pain today. Sore throat for 2-3 days. EXAM: PORTABLE CHEST 1 VIEW COMPARISON:  Portable chest 01/06/2016.  CT chest 11/07/2016. FINDINGS: 1702 hours. Stable cardiomegaly and mild aortic atherosclerosis. Probable mild left basilar atelectasis, exaggerated by overlying soft tissues. No edema, confluent airspace opacity, pleural effusion or pneumothorax identified. The bones appear unremarkable. Telemetry leads overlie the chest. IMPRESSION: No suspected acute findings. Cardiomegaly with mild left basilar atelectasis. Electronically Signed   By: Richardean Sale M.D.   On: 08/31/2018 17:38   Ct Maxillofacial Wo Contrast  Result Date: 09/08/2018 CLINICAL DATA:  Facial pain after syncope and fall today. EXAM: CT HEAD WITHOUT CONTRAST CT MAXILLOFACIAL WITHOUT CONTRAST TECHNIQUE: Multidetector CT imaging of the head and maxillofacial structures were performed using the standard protocol without intravenous contrast. Multiplanar CT image reconstructions of the maxillofacial structures were also generated. COMPARISON:  None. FINDINGS: CT HEAD FINDINGS Brain: No evidence of acute infarction, hemorrhage, hydrocephalus, extra-axial collection or mass lesion/mass effect. Vascular: No hyperdense vessel or unexpected calcification. Skull: Normal. Negative for fracture or focal lesion.  Other: None. CT MAXILLOFACIAL FINDINGS Osseous: No fracture or mandibular dislocation. No destructive process. Orbits: Negative. No traumatic or inflammatory finding. Sinuses: Clear. Soft tissues: Negative. IMPRESSION: Normal head CT. No definite abnormality seen in maxillofacial region. Electronically Signed   By: Marijo Conception M.D.   On: 09/08/2018 12:08

## 2018-09-11 NOTE — Progress Notes (Signed)
Spoke with patients daughter and updated her on patient status and plan. All questions answered at this time

## 2018-09-12 LAB — CBC WITH DIFFERENTIAL/PLATELET
Abs Immature Granulocytes: 0.1 10*3/uL — ABNORMAL HIGH (ref 0.00–0.07)
Basophils Absolute: 0 10*3/uL (ref 0.0–0.1)
Basophils Relative: 0 %
Eosinophils Absolute: 0 10*3/uL (ref 0.0–0.5)
Eosinophils Relative: 0 %
HCT: 39.1 % (ref 36.0–46.0)
Hemoglobin: 12.5 g/dL (ref 12.0–15.0)
Immature Granulocytes: 1 %
Lymphocytes Relative: 18 %
Lymphs Abs: 1.5 10*3/uL (ref 0.7–4.0)
MCH: 29.6 pg (ref 26.0–34.0)
MCHC: 32 g/dL (ref 30.0–36.0)
MCV: 92.7 fL (ref 80.0–100.0)
Monocytes Absolute: 0.6 10*3/uL (ref 0.1–1.0)
Monocytes Relative: 7 %
Neutro Abs: 6.1 10*3/uL (ref 1.7–7.7)
Neutrophils Relative %: 74 %
Platelets: 353 10*3/uL (ref 150–400)
RBC: 4.22 MIL/uL (ref 3.87–5.11)
RDW: 13.6 % (ref 11.5–15.5)
WBC: 8.3 10*3/uL (ref 4.0–10.5)
nRBC: 0 % (ref 0.0–0.2)

## 2018-09-12 LAB — GLUCOSE, CAPILLARY
Glucose-Capillary: 96 mg/dL (ref 70–99)
Glucose-Capillary: 130 mg/dL — ABNORMAL HIGH (ref 70–99)
Glucose-Capillary: 131 mg/dL — ABNORMAL HIGH (ref 70–99)
Glucose-Capillary: 144 mg/dL — ABNORMAL HIGH (ref 70–99)

## 2018-09-12 LAB — COMPREHENSIVE METABOLIC PANEL
ALT: 50 U/L — ABNORMAL HIGH (ref 0–44)
AST: 29 U/L (ref 15–41)
Albumin: 3 g/dL — ABNORMAL LOW (ref 3.5–5.0)
Alkaline Phosphatase: 48 U/L (ref 38–126)
Anion gap: 11 (ref 5–15)
BUN: 26 mg/dL — ABNORMAL HIGH (ref 8–23)
CO2: 27 mmol/L (ref 22–32)
Calcium: 8.6 mg/dL — ABNORMAL LOW (ref 8.9–10.3)
Chloride: 100 mmol/L (ref 98–111)
Creatinine, Ser: 0.83 mg/dL (ref 0.44–1.00)
GFR calc Af Amer: >60 mL/min (ref >60)
GFR calc non Af Amer: >60 mL/min (ref >60)
Glucose, Bld: 100 mg/dL — ABNORMAL HIGH (ref 70–99)
Potassium: 3.5 mmol/L (ref 3.5–5.1)
Sodium: 138 mmol/L (ref 135–145)
Total Bilirubin: 0.1 mg/dL — ABNORMAL LOW (ref 0.3–1.2)
Total Protein: 6.4 g/dL — ABNORMAL LOW (ref 6.5–8.1)

## 2018-09-12 LAB — LACTATE DEHYDROGENASE: LDH: 256 U/L — ABNORMAL HIGH (ref 98–192)

## 2018-09-12 LAB — D-DIMER, QUANTITATIVE: D-Dimer, Quant: 0.51 ug/mL-FEU — ABNORMAL HIGH (ref 0.00–0.50)

## 2018-09-12 LAB — FERRITIN: Ferritin: 351 ng/mL — ABNORMAL HIGH (ref 11–307)

## 2018-09-12 LAB — C-REACTIVE PROTEIN: CRP: 1 mg/dL — ABNORMAL HIGH (ref <1.0)

## 2018-09-12 LAB — BRAIN NATRIURETIC PEPTIDE: B Natriuretic Peptide: 52.3 pg/mL (ref 0.0–100.0)

## 2018-09-12 LAB — MAGNESIUM: Magnesium: 2.3 mg/dL (ref 1.7–2.4)

## 2018-09-12 MED ORDER — POTASSIUM CHLORIDE CRYS ER 20 MEQ PO TBCR
40.0000 meq | EXTENDED_RELEASE_TABLET | Freq: Once | ORAL | Status: AC
  Administered 2018-09-12: 40 meq via ORAL
  Filled 2018-09-12: qty 2

## 2018-09-12 MED ORDER — METHYLPREDNISOLONE SODIUM SUCC 40 MG IJ SOLR
40.0000 mg | Freq: Every day | INTRAMUSCULAR | Status: DC
  Administered 2018-09-12 – 2018-09-13 (×2): 40 mg via INTRAVENOUS
  Filled 2018-09-12 (×2): qty 1

## 2018-09-12 MED ORDER — ZOLPIDEM TARTRATE 5 MG PO TABS: 5.0000 mg | ORAL_TABLET | Freq: Every evening | ORAL | Status: DC | PRN

## 2018-09-12 NOTE — Progress Notes (Signed)
Occupational Therapy Treatment Patient Details Name: Caitlyn [name] MRN: [id number] DOB: 1950-05-31 Today's Date: 09/12/2018    History of present illness Caitlyn [name] is an 68 y.o. female w pmh sig for CAD, HTN, HL , [facility]/16 for  fevers, chills, myalgia, body aches and sore throat.  Returned Home. On 6/24 patient had a syncopal spell, brought by EMS to ED. Positive for COVID 6/16.   OT comments  Pt progressing towards established OT goals. Pt highly motivated to participate in therapy. Pt performing functional mobility in her room with Min Guard A and RW on 3L O2 maintaining SpO2 >90%. Pt performing toileting and hand hygiene with Min Guard A; SpO2 dropping to 78% on 3L and required seated rest break to return to 90%. Continue to recommend dc to home with HHOT and will continue to follow acutely as admitted.     Follow Up Recommendations  Home health OT;Supervision/Assistance - 24 hour(24hr initially)    Equipment Recommendations  3 in 1 bedside commode(for use in shower; may require bariatric)    Recommendations for Other Services      Precautions / Restrictions Precautions Precautions: Fall Restrictions Weight Bearing Restrictions: No       Mobility Bed Mobility               General bed mobility comments: received OOB in recliner  Transfers Overall transfer level: Needs assistance Equipment used: None Transfers: Sit to/from Stand Sit to Stand: Min guard         General transfer comment: for balance/safety    Balance                                           ADL either performed or assessed with clinical judgement   ADL Overall ADL's : Needs assistance/impaired     Grooming: Wash/dry hands;Min guard;Standing Grooming Details (indicate cue type and reason): Min Guard A for safety                 Toilet Transfer: Min guard;Ambulation;Regular Toilet;Grab bars;RW StatisticianToilet Transfer Details (indicate cue type and  reason): Min Guard A for safety. Pt Spo2 dropping to 79% on 3L during toileting Toileting- Clothing Manipulation and Hygiene: Min guard;Sitting/lateral lean Toileting - Clothing Manipulation Details (indicate cue type and reason): Min Guard A for safety     Functional mobility during ADLs: Min guard General ADL Comments: Pt performing functional mobility in room demonstrating increased activity tolerance. Pt maintaining SpO2 >90% on 3L. During toileting, pt Spo2 dropping to 78% on 3L, but she quickly recovered with seated rest break     Vision       Perception     Praxis      Cognition Arousal/Alertness: Awake/alert Behavior During Therapy: WFL for tasks assessed/performed Overall Cognitive Status: Within Functional Limits for tasks assessed                                          Exercises     Shoulder Instructions       General Comments      Pertinent Vitals/ Pain       Pain Assessment: No/denies pain  Home Living  Prior Functioning/Environment              Frequency  Min 3X/week        Progress Toward Goals  OT Goals(current goals can now be found in the care plan section)  Progress towards OT goals: Progressing toward goals  Acute Rehab OT Goals Patient Stated Goal: to get better. OT Goal Formulation: With patient Time For Goal Achievement: 09/23/18 Potential to Achieve Goals: Good ADL Goals Pt Will Perform Grooming: with modified independence;standing Pt Will Perform Lower Body Bathing: with modified independence;sit to/from stand Pt Will Perform Lower Body Dressing: with modified independence;sit to/from stand Pt Will Transfer to Toilet: with modified independence;ambulating Pt Will Perform Toileting - Clothing Manipulation and hygiene: with modified independence;sit to/from stand Additional ADL Goal #1: Pt will independently verbalize/demonstrate at least 3 energy  conservation techniques during functional tasks.  Plan Discharge plan remains appropriate    Co-evaluation                 AM-PAC OT "6 Clicks" Daily Activity     Outcome Measure   Help from another person eating meals?: None Help from another person taking care of personal grooming?: None Help from another person toileting, which includes using toliet, bedpan, or urinal?: A Little Help from another person bathing (including washing, rinsing, drying)?: A Little Help from another person to put on and taking off regular upper body clothing?: None Help from another person to put on and taking off regular lower body clothing?: A Little 6 Click Score: 21    End of Session Equipment Utilized During Treatment: Oxygen  OT Visit Diagnosis: Muscle weakness (generalized) (M62.81);Other (comment)(decreased activity tolerance)   Activity Tolerance Patient tolerated treatment well   Patient Left in chair;with call bell/phone within reach   Nurse Communication Mobility status        Time: 3291-9166 OT Time Calculation (min): 31 min  Charges: OT General Charges $OT Visit: 1 Visit OT Treatments $Self Care/Home Management : 8-22 mins $Therapeutic Activity: 8-22 mins  Shaquoia Miers MSOT, OTR/L Acute Rehab Pager: [phone] Office: [place] 09/12/2018, 5:20 PM

## 2018-09-12 NOTE — Progress Notes (Signed)
Pt placed on 3l n/c.  Tolerating well at this time

## 2018-09-12 NOTE — Progress Notes (Signed)
PROGRESS NOTE                                                                                                                                                                                                             Patient Demographics:    Caitlyn [name], is a 67 y.o. female, DOB - [birth year], HYQ:[id number]  Outpatient Primary MD for the patient is Sandi Mariscal, MD    LOS - 4  Admit date - 09/08/2018    Chief Complaint  Patient presents with  . Loss of Consciousness       Brief Narrative 67 y.o. female w pmh sig for CAD, HTN, HL state of health until 11 days prior to admission when she had onset of fevers, chills, myalgia, body aches and sore throat.  She was seen at [facility] ER on June 16 for these complaints but was noted to be well-appearing on physical exam without acute distress.  Her O2 saturations were 99% on room air at that time.  Patient went home and continued to feel unwell with general malaise, anorexia, nonproductive but severe cough, remittent nausea and occasional diarrhea which is now resolved.  On the day of hospital admission she was walking in her house, feeling weak generally and subsequently found herself on the floor passed out with some injury on her face.  She was brought to the ER where her work-up was essentially unremarkable except for mild dehydration, hyponatremia and hypoxia she was placed on 6 L nasal cannula oxygen and admitted to the hospital.   Subjective:   Patient in bed, appears comfortable, denies any headache, no fever, no chest pain or pressure, no shortness of breath , no abdominal pain. No focal weakness.   Assessment  & Plan :     1. Acute Hypoxic Resp. Failure due to Acute Covid 19 Viral Pneumonitis during the ongoing 2020 Covid 19 Pandemic - she was started on IV steroids along with REMDESIVIR upon admission followed by Actemra on 09/09/2018, hypoxia has improved still on high flow  nasal cannula will try to titrate down to nasal cannula oxygen 3 to 4 L/min.  Will gently diurese on 09/10/2018.  She will be sitting in chair in the daytime, flutter valve and I-S have been ordered for pulmonary toiletry.  Will monitor.  Inflammatory markers have stabilized.   COVID-19 Labs  Recent Labs    09/10/18 0300 09/11/18 0425 09/12/18 0412  DDIMER 0.83* 0.73* 0.51*  FERRITIN 414* 394* 351*  LDH 303* 317* 256*  CRP 5.5* 2.3* 1.0*    Lab Results  Component Value Date   SARSCOV2NAA DETECTED (A) 08/31/2018    Lab Results  Component Value Date   SARSCOV2NAA DETECTED (A) 08/31/2018     Hepatic Function Latest Ref Rng & Units 09/12/2018 09/11/2018 09/10/2018  Total Protein 6.5 - 8.1 g/dL 6.4(L) 6.6 6.9  Albumin 3.5 - 5.0 g/dL 3.0(L) 3.0(L) 2.9(L)  AST 15 - 41 U/L 29 53(H) 37  ALT 0 - 44 U/L 50(H) 60(H) 32  Alk Phosphatase 38 - 126 U/L 48 51 51  Total Bilirubin 0.3 - 1.2 mg/dL 0.1(L) 0.2(L) 0.3  Bilirubin, Direct 0.1 - 0.5 mg/dL - - -        Component Value Date/Time   BNP 52.3 09/12/2018 0412      2.  Syncope at home.  Likely due to dehydration induced by diarrhea, underlying diuretic use and poor oral intake causing orthostatic hypotension.  Currently stable on telemetry, EKG nonacute.  Stable for home discharge per PT, will discharge home with home PT and PCP follow-up.  3.  Dehydration hyponatremia.  Resolved after hydration and holding of Lasix, did develop some orthopnea and fluid overload on 09/10/2018 which has resolved after a dose of IV Lasix, now intermittent Lasix every other day orally.  4.  CAD.  Chest pain-free, EKG stable.  Continue dual antiplatelet therapy along with Coreg.  5.  Hypothyroidism.  Stable on Synthroid.  6.  DM type II.  Will add sliding scale insulin as she is on steroids.  Metformin on hold.  CBG (last 3)  Recent Labs    09/11/18 1610 09/11/18 2200 09/12/18 0723  GLUCAP 171* 152* 96   Lab Results  Component Value Date    HGBA1C 6.0 (H) 09/09/2018      Condition - Extremely Guarded  Family Communication  :  None  Code Status :  Full  Diet :   Diet Order            Diet heart healthy/carb modified Room service appropriate? Yes; Fluid consistency: Thin  Diet effective now               Disposition Plan  : Home likely in the morning if stable  Consults  :  None  Procedures  :    CT Head/maxillofacial.  Nonacute  PUD Prophylaxis :   DVT Prophylaxis  :  Lovenox    Lab Results  Component Value Date   PLT 353 09/12/2018    Inpatient Medications  Scheduled Meds: . aspirin  81 mg Oral Daily  . atorvastatin  20 mg Oral Daily  . carvedilol  3.125 mg Oral BID WC  . clonazePAM  1 mg Oral QHS  . clopidogrel  75 mg Oral Daily  . enoxaparin (LOVENOX) injection  0.5 mg/kg Subcutaneous Q24H  . feeding supplement (ENSURE ENLIVE)  237 mL Oral BID BM  . insulin aspart  0-5 Units Subcutaneous QHS  . insulin aspart  0-9 Units Subcutaneous TID WC  . levothyroxine  125 mcg Oral Q0600  . methylPREDNISolone (SOLU-MEDROL) injection  40 mg Intravenous Daily   Continuous Infusions: . remdesivir 100 mg in NS 250 mL 100 mg (09/11/18 1831)   PRN Meds:.acetaminophen, albuterol, bisacodyl, guaiFENesin-dextromethorphan, lip balm, sodium chloride  Antibiotics  :    Anti-infectives (From admission, onward)   Start  Dose/Rate Route Frequency Ordered Stop   09/09/18 1900  remdesivir 100 mg in sodium chloride 0.9 % 250 mL IVPB     100 mg 500 mL/hr over 30 Minutes Intravenous Every 24 hours 09/08/18 1739 09/13/18 1859   09/08/18 1900  remdesivir 200 mg in sodium chloride 0.9 % 250 mL IVPB     200 mg 500 mL/hr over 30 Minutes Intravenous Once 09/08/18 1739 09/08/18 1955       Time Spent in minutes  30   Lala Lund M.D on 09/12/2018 at 10:16 AM  To page go to [email or web address] - password [facility]  [facility] -  Office  [phone]   See all Orders from today for further details     Objective:   Vitals:   09/12/18 0420 09/12/18 0500 09/12/18 0600 09/12/18 0743  BP:  (!) 162/91  129/64  Pulse:  (!) 59 63 60  Resp: (!) 22     Temp: 98.3 F (36.8 C)   97.6 F (36.4 C)  TempSrc: Axillary   Oral  SpO2:  92% (!) 88% (!) 87%  Weight:      Height:        Wt Readings from Last 3 Encounters:  09/08/18 120.3 kg  08/31/18 120.2 kg  05/26/18 78.5 kg     Intake/Output Summary (Last 24 hours) at 09/12/2018 1016 Last data filed at 09/12/2018 0900 Gross per 24 hour  Intake -  Output 1200 ml  Net -1200 ml     Physical Exam  Awake Alert, Oriented X 3, No new F.N deficits, Normal affect [place].AT,PERRAL Supple Neck,No JVD, No cervical lymphadenopathy appriciated.  Symmetrical Chest wall movement, Good air movement bilaterally, CTAB RRR,No Gallops, Rubs or new Murmurs, No Parasternal Heave +ve B.Sounds, Abd Soft, No tenderness, No organomegaly appriciated, No rebound - guarding or rigidity. No Cyanosis, Clubbing or edema, No new Rash or bruise    Data Review:    CBC Recent Labs  Lab 09/08/18 1153 09/08/18 1853 09/09/18 0455 09/10/18 0300 09/11/18 0425 09/12/18 0412  WBC 6.4 5.5 2.5* 8.3 11.0* 8.3  HGB 13.3 13.0 12.4 12.9 13.0 12.5  HCT 39.8 38.7 37.3 38.8 40.0 39.1  PLT 209 213 230 290 356 353  MCV 90.9 91.9 92.3 92.6 93.5 92.7  MCH 30.4 30.9 30.7 30.8 30.4 29.6  MCHC 33.4 33.6 33.2 33.2 32.5 32.0  RDW 13.3 13.5 13.6 13.5 13.7 13.6  LYMPHSABS 0.3*  --  0.5* 0.7 1.2 1.5  MONOABS 0.3  --  0.1 0.4 0.8 0.6  EOSABS 0.0  --  0.0 0.2 0.0 0.0  BASOSABS 0.1  --  0.0 0.0 0.0 0.0    Chemistries  Recent Labs  Lab 09/08/18 1153 09/08/18 1853 09/09/18 0455 09/10/18 0300 09/11/18 0425 09/12/18 0412  NA 133*  --  135 137 138 138  K 3.6  --  3.3* 3.5 3.5 3.5  CL 93*  --  98 100 98 100  CO2 29  --  _0 GLUCOSE 129*  --  161* 147* 118* 100*  BUN 15  --  15 20 27* 26*  CREATININE 0.87 0.86 0.74 0.77 0.91 0.83  CALCIUM 8.7*  --  8.4* 8.6* 8.6* 8.6*   MG  --   --  2.3 2.2 2.2 2.3  AST 44*  --  35 37 53* 29  ALT 29  --  26 32 60* 50*  ALKPHOS 56  --  54 51 51 48  BILITOT 0.7  --  0.2* 0.3 0.2* 0.1*   ------------------------------------------------------------------------------------------------------------------ No results for input(s): CHOL, HDL, LDLCALC, TRIG, CHOLHDL, LDLDIRECT in the last 72 hours.  Lab Results  Component Value Date   HGBA1C 6.0 (H) 09/09/2018   ------------------------------------------------------------------------------------------------------------------ No results for input(s): TSH, T4TOTAL, T3FREE, THYROIDAB in the last 72 hours.  Invalid input(s): FREET3  Cardiac Enzymes No results for input(s): CKMB, TROPONINI, MYOGLOBIN in the last 168 hours.  Invalid input(s): CK ------------------------------------------------------------------------------------------------------------------    Component Value Date/Time   BNP 52.3 09/12/2018 0412    Micro Results No results found for this or any previous visit (from the past 240 hour(s)).  Radiology Reports Ct Head Wo Contrast  Result Date: 09/08/2018 CLINICAL DATA:  Facial pain after syncope and fall today. EXAM: CT HEAD WITHOUT CONTRAST CT MAXILLOFACIAL WITHOUT CONTRAST TECHNIQUE: Multidetector CT imaging of the head and maxillofacial structures were performed using the standard protocol without intravenous contrast. Multiplanar CT image reconstructions of the maxillofacial structures were also generated. COMPARISON:  None. FINDINGS: CT HEAD FINDINGS Brain: No evidence of acute infarction, hemorrhage, hydrocephalus, extra-axial collection or mass lesion/mass effect. Vascular: No hyperdense vessel or unexpected calcification. Skull: Normal. Negative for fracture or focal lesion. Other: None. CT MAXILLOFACIAL FINDINGS Osseous: No fracture or mandibular dislocation. No destructive process. Orbits: Negative. No traumatic or inflammatory finding. Sinuses: Clear. Soft  tissues: Negative. IMPRESSION: Normal head CT. No definite abnormality seen in maxillofacial region. Electronically Signed   By: Marijo Conception M.D.   On: 09/08/2018 12:08   Dg Chest Port 1 View  Result Date: 09/08/2018 CLINICAL DATA:  Syncope. Hypoxia. COVID-19 positive. EXAM: PORTABLE CHEST 1 VIEW COMPARISON:  08/31/2018 FINDINGS: The cardiac silhouette remains mildly enlarged. The lungs are mildly hypoinflated. There is new airspace opacity in the left lung base, and there is milder patchy and streaky opacity in the right lung base. The interstitial markings are mildly increased bilaterally compared to the prior study. No sizable pleural effusion or pneumothorax is identified. Mild thoracic scoliosis is noted. IMPRESSION: New left basilar airspace opacity concerning for pneumonia. Additional right basilar pneumonia versus atelectasis. Electronically Signed   By: Logan Bores M.D.   On: 09/08/2018 11:35   Dg Chest Port 1 View  Result Date: 08/31/2018 CLINICAL DATA:  Fever, chills and chest pain today. Sore throat for 2-3 days. EXAM: PORTABLE CHEST 1 VIEW COMPARISON:  Portable chest 01/06/2016.  CT chest 11/07/2016. FINDINGS: 1702 hours. Stable cardiomegaly and mild aortic atherosclerosis. Probable mild left basilar atelectasis, exaggerated by overlying soft tissues. No edema, confluent airspace opacity, pleural effusion or pneumothorax identified. The bones appear unremarkable. Telemetry leads overlie the chest. IMPRESSION: No suspected acute findings. Cardiomegaly with mild left basilar atelectasis. Electronically Signed   By: Richardean Sale M.D.   On: 08/31/2018 17:38   Ct Maxillofacial Wo Contrast  Result Date: 09/08/2018 CLINICAL DATA:  Facial pain after syncope and fall today. EXAM: CT HEAD WITHOUT CONTRAST CT MAXILLOFACIAL WITHOUT CONTRAST TECHNIQUE: Multidetector CT imaging of the head and maxillofacial structures were performed using the standard protocol without intravenous contrast.  Multiplanar CT image reconstructions of the maxillofacial structures were also generated. COMPARISON:  None. FINDINGS: CT HEAD FINDINGS Brain: No evidence of acute infarction, hemorrhage, hydrocephalus, extra-axial collection or mass lesion/mass effect. Vascular: No hyperdense vessel or unexpected calcification. Skull: Normal. Negative for fracture or focal lesion. Other: None. CT MAXILLOFACIAL FINDINGS Osseous: No fracture or mandibular dislocation. No destructive process. Orbits: Negative. No traumatic or inflammatory finding. Sinuses: Clear. Soft tissues: Negative. IMPRESSION: Normal head CT. No definite  abnormality seen in maxillofacial region. Electronically Signed   By: Marijo Conception M.D.   On: 09/08/2018 12:08

## 2018-09-13 LAB — GLUCOSE, CAPILLARY
Glucose-Capillary: 158 mg/dL — ABNORMAL HIGH (ref 70–99)
Glucose-Capillary: 154 mg/dL — ABNORMAL HIGH (ref 70–99)
Glucose-Capillary: 84 mg/dL (ref 70–99)
Glucose-Capillary: 119 mg/dL — ABNORMAL HIGH (ref 70–99)
Glucose-Capillary: 155 mg/dL — ABNORMAL HIGH (ref 70–99)

## 2018-09-13 LAB — LACTATE DEHYDROGENASE: LDH: 257 U/L — ABNORMAL HIGH (ref 98–192)

## 2018-09-13 MED ORDER — FUROSEMIDE 10 MG/ML IJ SOLN
20.0000 mg | Freq: Once | INTRAMUSCULAR | Status: AC
  Administered 2018-09-13: 20 mg via INTRAVENOUS
  Filled 2018-09-13: qty 2

## 2018-09-13 MED ORDER — PREDNISONE 5 MG PO TABS: ORAL_TABLET | ORAL | 0 refills | Status: DC

## 2018-09-13 MED ORDER — POTASSIUM CHLORIDE CRYS ER 20 MEQ PO TBCR
40.0000 meq | EXTENDED_RELEASE_TABLET | Freq: Once | ORAL | Status: AC
  Administered 2018-09-13: 40 meq via ORAL
  Filled 2018-09-13: qty 2

## 2018-09-13 NOTE — Progress Notes (Signed)
Reviewed AVS with patient, paper RX with AVS. Reviewed proper way to use the portable home oxygen tank. Provided patient with pulse ox and thermometer. Reviewed proper way to use home pulse ox. Updated daughter with D/C instructions.

## 2018-09-13 NOTE — Progress Notes (Signed)
Occupational Therapy Treatment Patient Details Name: Caitlyn [name] MRN: [id number] DOB: 05/17/1950 Today's Date: 09/13/2018    History of present illness Caitlyn [name] is an 68 y.o. female w pmh sig for CAD, HTN, HL , [facility] 6/16 for  fevers, chills, myalgia, body aches and sore throat.  Returned Home. On 6/24 patient had a syncopal spell, brought by EMS to ED. Positive for COVID 6/16.   OT comments  Pt progressing towards OT goals this session, able to complete toilet transfer and sink level grooming at min guard with RW for mobility. Pt initially on 3L O2 for in room, and hallway ambulation, but was able to decrease to 2L O2 and remain at or above 90% throughout session. Pt did require 1 standing rest break during hallway ambulation. Educated on energy conservation techniques and safety for bathing (including demonstration of how 3 in 1 works as a Paediatric nurseshower chair) Pt asking questions about diet, which OT deferred to MD or other specialist. Current POC remains appropriate, HHOT is essential as Pt will be going home alone, she will have assist from family and friends for cooking meals and such.    Follow Up Recommendations  Home health OT;Supervision/Assistance - 24 hour(24hr initially)    Equipment Recommendations  3 in 1 bedside commode(for use in shower; may require bariatric)    Recommendations for Other Services      Precautions / Restrictions Precautions Precautions: Fall Restrictions Weight Bearing Restrictions: No       Mobility Bed Mobility               General bed mobility comments: received OOB in recliner  Transfers Overall transfer level: Needs assistance Equipment used: None;Rolling walker (2 wheeled) Transfers: Sit to/from Stand Sit to Stand: Min guard         General transfer comment: for balance/safety    Balance                                           ADL either performed or assessed with clinical judgement   ADL  Overall ADL's : Needs assistance/impaired     Grooming: Wash/dry hands;Min guard;Standing Grooming Details (indicate cue type and reason): sink level, vc for safety with RW                 Toilet Transfer: Min guard;Ambulation;Regular Toilet;Grab bars;RW StatisticianToilet Transfer Details (indicate cue type and reason): Min Guard for safety. Toileting- Clothing Manipulation and Hygiene: Sitting/lateral Public affairs consultantlean;Supervision/safety     Tub/Shower Transfer Details (indicate cue type and reason): educated on use of 3 in 1 as shower chair with demonstration for adjusting legs Functional mobility during ADLs: Nurse, children'sMin guard;Rolling walker       Vision       Perception     Praxis      Cognition Arousal/Alertness: Awake/alert Behavior During Therapy: WFL for tasks assessed/performed Overall Cognitive Status: Within Functional Limits for tasks assessed                                 General Comments: anxious about going home and continued recovery        Exercises     Shoulder Instructions       General Comments      Pertinent Vitals/ Pain       Pain Assessment:  No/denies pain  Home Living                                          Prior Functioning/Environment              Frequency  Min 3X/week        Progress Toward Goals  OT Goals(current goals can now be found in the care plan section)  Progress towards OT goals: Progressing toward goals  Acute Rehab OT Goals Patient Stated Goal: to get better. OT Goal Formulation: With patient Time For Goal Achievement: 09/23/18 Potential to Achieve Goals: Good  Plan Discharge plan remains appropriate    Co-evaluation                 AM-PAC OT "6 Clicks" Daily Activity     Outcome Measure   Help from another person eating meals?: None Help from another person taking care of personal grooming?: None Help from another person toileting, which includes using toliet, bedpan, or urinal?:  A Little Help from another person bathing (including washing, rinsing, drying)?: A Little Help from another person to put on and taking off regular upper body clothing?: None Help from another person to put on and taking off regular lower body clothing?: A Little 6 Click Score: 21    End of Session Equipment Utilized During Treatment: Oxygen;Rolling walker(2-3L)  OT Visit Diagnosis: Muscle weakness (generalized) (M62.81);Other (comment)(decreased activity tolerance)   Activity Tolerance Patient tolerated treatment well   Patient Left in chair;with call bell/phone within reach   Nurse Communication Mobility status        Time: 8563-1497 OT Time Calculation (min): 23 min  Charges: OT General Charges $OT Visit: 1 Visit OT Treatments $Self Care/Home Management : 23-37 mins  Hulda Humphrey OTR/L Acute Rehabilitation Services Pager: [phone] Office: [place] 09/13/2018, 12:05 PM

## 2018-09-13 NOTE — Discharge Instructions (Signed)
Follow with Primary MD Salli RealSun, Yun, MD in 7 days   Get CBC, CMP, 2 view Chest X ray -  checked next visit within 1 week by Primary MD   Activity: As tolerated with Full fall precautions use walker/cane & assistance as needed  Disposition Home   Diet: Heart Healthy Low Carb.  For Heart failure patients - Check your Weight same time everyday, if you gain over 2 pounds, or you develop in leg swelling, experience more shortness of breath or chest pain, call your Primary MD immediately. Follow Cardiac Low Salt Diet and 1.5 lit/day fluid restriction.  Special Instructions: If you have smoked or chewed Tobacco  in the last 2 yrs please stop smoking, stop any regular Alcohol  and or any Recreational drug use.  On your next visit with your primary care physician please Get Medicines reviewed and adjusted.  Please request your Prim.MD to go over all Hospital Tests and Procedure/Radiological results at the follow up, please get all Hospital records sent to your Prim MD by signing hospital release before you go home.  If you experience worsening of your admission symptoms, develop shortness of breath, life threatening emergency, suicidal or homicidal thoughts you must seek medical attention immediately by calling 911 or calling your MD immediately  if symptoms less severe.  You Must read complete instructions/literature along with all the possible adverse reactions/side effects for all the Medicines you take and that have been prescribed to you. Take any new Medicines after you have completely understood and accpet all the possible adverse reactions/side effects.       Person Under Monitoring Name: Caitlyn [name]  [place] [address] [place] [id number]   Infection Prevention Recommendations for Individuals Confirmed to have, or Being Evaluated for, 2019 Novel Coronavirus (COVID-19) Infection Who Receive Care at Home  Individuals who are confirmed to have, or are being evaluated for,  COVID-19 should follow the prevention steps below until a healthcare provider or local or state health department says they can return to normal activities.  Stay home except to get medical care You should restrict activities outside your home, except for getting medical care. Do not go to work, school, or public areas, and do not use public transportation or taxis.  Call ahead before visiting your doctor Before your medical appointment, call the healthcare provider and tell them that you have, or are being evaluated for, COVID-19 infection. This will help the healthcare providers office take steps to keep other people from getting infected. Ask your healthcare provider to call the local or state health department.  Monitor your symptoms Seek prompt medical attention if your illness is worsening (e.g., difficulty breathing). Before going to your medical appointment, call the healthcare provider and tell them that you have, or are being evaluated for, COVID-19 infection. Ask your healthcare provider to call the local or state health department.  Wear a facemask You should wear a facemask that covers your nose and mouth when you are in the same room with other people and when you visit a healthcare provider. People who live with or visit you should also wear a facemask while they are in the same room with you.  Separate yourself from other people in your home As much as possible, you should stay in a different room from other people in your home. Also, you should use a separate bathroom, if available.  Avoid sharing household items You should not share dishes, drinking glasses, cups, eating utensils, towels, bedding, or  other items with other people in your home. After using these items, you should wash them thoroughly with soap and water.  Cover your coughs and sneezes Cover your mouth and nose with a tissue when you cough or sneeze, or you can cough or sneeze into your sleeve.  Throw used tissues in a lined trash can, and immediately wash your hands with soap and water for at least 20 seconds or use an alcohol-based hand rub.  Wash your [employer] Wash your hands often and thoroughly with soap and water for at least 20 seconds. You can use an alcohol-based hand sanitizer if soap and water are not available and if your hands are not visibly dirty. Avoid touching your eyes, nose, and mouth with unwashed hands.   Prevention Steps for Caregivers and Household Members of Individuals Confirmed to have, or Being Evaluated for, COVID-19 Infection Being Cared for in the Home  If you live with, or provide care at home for, a person confirmed to have, or being evaluated for, COVID-19 infection please follow these guidelines to prevent infection:  Follow healthcare providers instructions Make sure that you understand and can help the patient follow any healthcare provider instructions for all care.  Provide for the patients basic needs You should help the patient with basic needs in the home and provide support for getting groceries, prescriptions, and other personal needs.  Monitor the patients symptoms If they are getting sicker, call his or her medical provider and tell them that the patient has, or is being evaluated for, COVID-19 infection. This will help the healthcare providers office take steps to keep other people from getting infected. Ask the healthcare provider to call the local or state health department.  Limit the number of people who have contact with the patient  If possible, have only one caregiver for the patient.  Other household members should stay in another home or place of residence. If this is not possible, they should stay  in another room, or be separated from the patient as much as possible. Use a separate bathroom, if available.  Restrict visitors who do not have an essential need to be in the home.  Keep older adults, very young  children, and other sick people away from the patient Keep older adults, very young children, and those who have compromised immune systems or chronic health conditions away from the patient. This includes people with chronic heart, lung, or kidney conditions, diabetes, and cancer.  Ensure good ventilation Make sure that shared spaces in the home have good air flow, such as from an air conditioner or an opened window, weather permitting.  Wash your hands often  Wash your hands often and thoroughly with soap and water for at least 20 seconds. You can use an alcohol based hand sanitizer if soap and water are not available and if your hands are not visibly dirty.  Avoid touching your eyes, nose, and mouth with unwashed hands.  Use disposable paper towels to dry your hands. If not available, use dedicated cloth towels and replace them when they become wet.  Wear a facemask and gloves  Wear a disposable facemask at all times in the room and gloves when you touch or have contact with the patients blood, body fluids, and/or secretions or excretions, such as sweat, saliva, sputum, nasal mucus, vomit, urine, or feces.  Ensure the mask fits over your nose and mouth tightly, and do not touch it during use.  Throw out disposable facemasks and gloves after  using them. Do not reuse.  Wash your hands immediately after removing your facemask and gloves.  If your personal clothing becomes contaminated, carefully remove clothing and launder. Wash your hands after handling contaminated clothing.  Place all used disposable facemasks, gloves, and other waste in a lined container before disposing them with other household waste.  Remove gloves and wash your hands immediately after handling these items.  Do not share dishes, glasses, or other household items with the patient  Avoid sharing household items. You should not share dishes, drinking glasses, cups, eating utensils, towels, bedding, or other items  with a patient who is confirmed to have, or being evaluated for, COVID-19 infection.  After the person uses these items, you should wash them thoroughly with soap and water.  Wash laundry thoroughly  Immediately remove and wash clothes or bedding that have blood, body fluids, and/or secretions or excretions, such as sweat, saliva, sputum, nasal mucus, vomit, urine, or feces, on them.  Wear gloves when handling laundry from the patient.  Read and follow directions on labels of laundry or clothing items and detergent. In general, wash and dry with the warmest temperatures recommended on the label.  Clean all areas the individual has used often  Clean all touchable surfaces, such as counters, tabletops, doorknobs, bathroom fixtures, toilets, phones, keyboards, tablets, and bedside tables, every day. Also, clean any surfaces that may have blood, body fluids, and/or secretions or excretions on them.  Wear gloves when cleaning surfaces the patient has come in contact with.  Use a diluted bleach solution (e.g., dilute bleach with 1 part bleach and 10 parts water) or a household disinfectant with a label that says EPA-registered for coronaviruses. To make a bleach solution at home, add 1 tablespoon of bleach to 1 quart (4 cups) of water. For a larger supply, add  cup of bleach to 1 gallon (16 cups) of water.  Read labels of cleaning products and follow recommendations provided on product labels. Labels contain instructions for safe and effective use of the cleaning product including precautions you should take when applying the product, such as wearing gloves or eye protection and making sure you have good ventilation during use of the product.  Remove gloves and wash hands immediately after cleaning.  Monitor yourself for signs and symptoms of illness Caregivers and household members are considered close contacts, should monitor their health, and will be asked to limit movement outside of the  home to the extent possible. Follow the monitoring steps for close contacts listed on the symptom monitoring form.   ? If you have additional questions, contact your local health department or call the epidemiologist on call at [phone] (available 24/7). ? This guidance is subject to change. For the most up-to-date guidance from [facility], please refer to their website: [email or web address]

## 2018-09-13 NOTE — Progress Notes (Signed)
SATURATION QUALIFICATIONS: (This note is used to comply with regulatory documentation for home oxygen)  Patient Saturations on Room Air at Rest=89%  Patient Saturations on Room Air while Ambulating = 87%  Patient Saturations on 2 Liters of oxygen while Ambulating = 92%  Please briefly explain why patient needs home oxygen: 

## 2018-09-13 NOTE — TOC Transition Note (Signed)
Transition of Care [facility]) - CM/SW Discharge Note   Patient Details  Name: [name] MRN: [id number] Date of Birth: 24-May-1950  Transition of Care [facility]) CM/SW Contact:  Midge Minium RN, BSN, NCM-BC, ACM-RN [phone] (working remotely) Phone Number: 09/13/2018, 12:59 PM   Clinical Narrative:    CM following for dispositional needs. CM spoke to the patient via phone to discuss the POC. Patient states living at home alone and being independent with her ADLs PTA. PCP verified as: Sandi Mariscal; patients demographics confirmed. PT/OT eval completed with HH/DME recommended with 24hr assist. Patient reports she will transition home; requested I contact her daughter Brianna Bennett to discuss caregiver support/arrange home oxygen delivery. CMS HH Compare/DME preference provided to the patient via phone, with [employer] selected for DME (home oxygen/bariatric [facility]); no preference for [facility] services. HH referral given to Glyn Ade RN, [facility]; AVS updated. CM spoke to the patients daughter Rabab Currington, as requested, to discuss POC needs. Patients daughter indicated the patient will have 24hr assistance, and she will be available for delivery of the DME. Patient will be provided with a portable oxygen tank, thermometer and pulse ox device before discharge. Patients daughter will provide transportation home.    Final next level of care: [place] Barriers to Discharge: No Barriers Identified   Patient Goals and CMS Choice Patient states their goals for this hospitalization and ongoing recovery are:: "to return back home" CMS Medicare.gov Compare Post Acute Care list provided to:: Patient Choice offered to / list presented to : Patient   Discharge Plan and Services                DME Arranged: Bedside commode, Oxygen(bariatric) DME Agency: [place] Date DME Agency Contacted: 09/13/18 Time DME Agency Contacted: 1202 Representative spoke with at DME Agency: Learta Codding  liaison HH Arranged: RN, Disease Management, PT, OT, Nurse's Aide, Social Work [facility] Agency: [facility] [place] Date [place]: 09/13/18 Time [place]: 1204 Representative spoke with at [place]: Glyn Ade RN liaison  Social Determinants of Health (SDOH) Interventions     Readmission Risk Interventions Readmission Risk Prevention Plan 09/13/2018  Transportation Screening Complete  PCP or Specialist Appt within 5-7 Days Complete  Home Care Screening Complete  Medication Review (RN CM) Complete  Some recent data might be hidden

## 2018-09-13 NOTE — Discharge Summary (Signed)
Caitlyn [name] XLK:[id number] DOB: [birth year] DOA: 09/08/2018  PCP: Salli RealSun, Yun, MD  Admit date: 09/08/2018  Discharge date: 09/13/2018  Admitted From: Home  Disposition:  Home   Recommendations for Outpatient Follow-up:   Follow up with PCP in 1-2 weeks  PCP Please obtain BMP/CBC, 2 view CXR in 1week,  (see Discharge instructions)   PCP Please follow up on the following pending results:    Home Health: PT,RN,Aide, S Work   Equipment/Devices: Agricultural consultantolling walker, 02 2 lit/min  Consultations: None Discharge Condition: Fair   CODE STATUS: Full   Diet Recommendation: Heart Healthy Low Carb     Chief Complaint  Patient presents with   Loss of Consciousness     Brief history of present illness from the day of admission and additional interim summary    68 y.o.femalew pmh sig for CAD, HTN, HLstate of health until 11 days prior to admission when she had onset of fevers, chills, myalgia, body aches and sore throat. She was seen at [facility] ER on June 16 for these complaints but was noted to be well-appearing on physical exam without acute distress. Her O2 saturations were 99% on room air at that time. Patient went home and continued to feel unwell with general malaise, anorexia, nonproductive but severe cough, remittent nausea and occasional diarrhea which is now resolved.  On the day of hospital admission she was walking in her house, feeling weak generally and subsequently found herself on the floor passed out with some injury on her face.  She was brought to the ER where her work-up was essentially unremarkable except for mild dehydration, hyponatremia and hypoxia she was placed on 6 L nasal cannula oxygen and admitted to the hospital.                                                                 Hospital Course     1. Acute Hypoxic Resp. Failure due to Acute Covid 19 Viral Pneumonitis during the ongoing 2020 Covid 19 Pandemic - she was started on IV steroids along with REMDESIVIR upon admission followed by Actemra on 09/09/2018, hypoxia has improved  and she is symptom-free on 3 L nasal cannula oxygen from high flow 8 L 2 days ago.    She is in no distress, symptom-free will be discharged home on oral prednisone taper, she will get 3 L nasal cannula oxygen for home along with a walker, updated daughter on the plan, will follow up with PCP in a week.  2.  Syncope at home.  Likely due to dehydration induced by diarrhea, underlying diuretic use and poor oral intake causing orthostatic hypotension.    He was hydrated and symptoms resolved, was cleared by PT, home dose combination of diuretic and ARB will be discontinued, PCP to monitor blood pressure and  hydration status closely upon discharge.  3.  Dehydration hyponatremia.  Resolved after hydration and holding of Lasix, resume home regimen of as needed Lasix.  PCP to monitor BMP and clinically her hydration status.  4.  CAD.  Chest pain-free, EKG stable.  Continue dual antiplatelet therapy along with Coreg.  5.  Hypothyroidism.  Stable on Synthroid.  6.  DM type II.    Continue home regimen of Glucophage and follow with PCP for glycemic control.   Discharge diagnosis     Principal Problem:   COVID-19 virus infection Active Problems:   Essential hypertension, benign   CAD (coronary artery disease)   Depression   Prediabetes   Class 3 severe obesity with serious comorbidity and body mass index (BMI) of 40.0 to 44.9 in adult [facility])    Discharge instructions    Discharge Instructions    Discharge instructions   Complete by: As directed    Follow with Primary MD Salli Real, MD in 7 days   Get CBC, CMP, 2 view Chest X ray -  checked next visit within 1 week by Primary MD   Activity: As tolerated with Full fall precautions use walker/cane &  assistance as needed  Disposition Home   Diet: Heart Healthy Low Carb.  For Heart failure patients - Check your Weight same time everyday, if you gain over 2 pounds, or you develop in leg swelling, experience more shortness of breath or chest pain, call your Primary MD immediately. Follow Cardiac Low Salt Diet and 1.5 lit/day fluid restriction.  Special Instructions: If you have smoked or chewed Tobacco  in the last 2 yrs please stop smoking, stop any regular Alcohol  and or any Recreational drug use.  On your next visit with your primary care physician please Get Medicines reviewed and adjusted.  Please request your Prim.MD to go over all Hospital Tests and Procedure/Radiological results at the follow up, please get all Hospital records sent to your Prim MD by signing hospital release before you go home.  If you experience worsening of your admission symptoms, develop shortness of breath, life threatening emergency, suicidal or homicidal thoughts you must seek medical attention immediately by calling 911 or calling your MD immediately  if symptoms less severe.  You Must read complete instructions/literature along with all the possible adverse reactions/side effects for all the Medicines you take and that have been prescribed to you. Take any new Medicines after you have completely understood and accpet all the possible adverse reactions/side effects.   Increase activity slowly   Complete by: As directed       Discharge Medications   Allergies as of 09/13/2018      Reactions   Amlodipine Swelling   Statins Other (See Comments)   myalgia      Medication List    STOP taking these medications   Edarbyclor 40-12.5 MG Tabs Generic drug: Azilsartan-Chlorthalidone     TAKE these medications   acetaminophen 500 MG tablet Commonly known as: TYLENOL Take 1,000 mg by mouth every 6 (six) hours as needed for moderate pain.   albuterol 108 (90 Base) MCG/ACT inhaler Commonly known as:  VENTOLIN HFA Inhale 2 puffs into the lungs every 6 (six) hours as needed for wheezing or shortness of breath.   allopurinol 100 MG tablet Commonly known as: ZYLOPRIM Take 1 tablet (100 mg total) by mouth daily.   aspirin 81 MG tablet Take 81 mg by mouth daily. Reported on 04/04/2015   atorvastatin 20 MG tablet Commonly  known as: LIPITOR Take 20 mg by mouth daily.   carvedilol 3.125 MG tablet Commonly known as: COREG Take 3.125 mg by mouth 2 (two) times daily with a meal.   clonazePAM 1 MG tablet Commonly known as: KLONOPIN Take 1 mg by mouth at bedtime.   clopidogrel 75 MG tablet Commonly known as: PLAVIX Take 75 mg by mouth daily.   Colcrys 0.6 MG tablet Generic drug: colchicine Take 0.6 mg by mouth daily as needed (for flare ups). Flare ups   fluconazole 150 MG tablet Commonly known as: Diflucan Take 1 tablet (150 mg total) by mouth daily.   furosemide 40 MG tablet Commonly known as: LASIX Take 40 mg by mouth daily as needed for fluid or edema.   levothyroxine 125 MCG tablet Commonly known as: SYNTHROID Take 125 mcg by mouth daily before breakfast.   metFORMIN 500 MG tablet Commonly known as: GLUCOPHAGE Take 1 tablet (500 mg total) by mouth daily with breakfast.   methocarbamol 500 MG tablet Commonly known as: ROBAXIN Take 500 mg by mouth every 8 (eight) hours as needed for muscle spasms.   nitroGLYCERIN 0.4 MG SL tablet Commonly known as: NITROSTAT   potassium chloride 10 MEQ tablet Commonly known as: K-DUR Take 10 mEq by mouth daily as needed. Taking 1 tablet when taking Lasix   predniSONE 5 MG tablet Commonly known as: DELTASONE Label  & dispense according to the schedule below. take 8 Pills PO for 3 days, 6 Pills PO for 3 days, 4 Pills PO for 3 days, 2 Pills PO for 3 days, 1 Pills PO for 3 days, 1/2 Pill  PO for 3 days then STOP. Total 65 pills.   pseudoephedrine-acetaminophen 30-500 MG Tabs tablet Commonly known as: TYLENOL SINUS Take 2 tablets by  mouth every 4 (four) hours as needed.   terconazole 0.4 % vaginal cream Commonly known as: Terazol 7 Place 1 applicator vaginally at bedtime. Use for 7 days.   Vitamin D (Ergocalciferol) 1.25 MG (50000 UT) Caps capsule Commonly known as: DRISDOL Take 50,000 Units by mouth every Monday.            Durable Medical Equipment  (From admission, onward)         Start     Ordered   09/13/18 1034  For home use only DME Walker rolling  [facility](Walkers)  Once    Question:  Patient needs a walker to treat with the following condition  Answer:  Syncope   09/13/18 1033   09/13/18 1024  For home use only DME oxygen  Once    Question Answer Comment  Length of Need 6 Months   Mode or (Route) Nasal cannula   Liters per Minute 3   Frequency Continuous (stationary and portable oxygen unit needed)   Oxygen conserving device Yes   Oxygen delivery system Gas      09/13/18 1023          Follow-up Information    Salli RealSun, Yun, MD. Schedule an appointment as soon as possible for a visit in 1 week(s).   Specialty: Internal Medicine Contact information: [address] [place] [address] [phone]           Major procedures and Radiology Reports - PLEASE review detailed and final reports thoroughly  -         Ct Head Wo Contrast  Result Date: 09/08/2018 CLINICAL DATA:  Facial pain after syncope and fall today. EXAM: CT HEAD WITHOUT CONTRAST CT MAXILLOFACIAL WITHOUT CONTRAST TECHNIQUE: Multidetector  CT imaging of the head and maxillofacial structures were performed using the standard protocol without intravenous contrast. Multiplanar CT image reconstructions of the maxillofacial structures were also generated. COMPARISON:  None. FINDINGS: CT HEAD FINDINGS Brain: No evidence of acute infarction, hemorrhage, hydrocephalus, extra-axial collection or mass lesion/mass effect. Vascular: No hyperdense vessel or unexpected calcification. Skull: Normal. Negative for fracture or focal lesion.  Other: None. CT MAXILLOFACIAL FINDINGS Osseous: No fracture or mandibular dislocation. No destructive process. Orbits: Negative. No traumatic or inflammatory finding. Sinuses: Clear. Soft tissues: Negative. IMPRESSION: Normal head CT. No definite abnormality seen in maxillofacial region. Electronically Signed   By: Lupita RaiderJames  Green Jr M.D.   On: 09/08/2018 12:08   Dg Chest Port 1 View  Result Date: 09/08/2018 CLINICAL DATA:  Syncope. Hypoxia. COVID-19 positive. EXAM: PORTABLE CHEST 1 VIEW COMPARISON:  08/31/2018 FINDINGS: The cardiac silhouette remains mildly enlarged. The lungs are mildly hypoinflated. There is new airspace opacity in the left lung base, and there is milder patchy and streaky opacity in the right lung base. The interstitial markings are mildly increased bilaterally compared to the prior study. No sizable pleural effusion or pneumothorax is identified. Mild thoracic scoliosis is noted. IMPRESSION: New left basilar airspace opacity concerning for pneumonia. Additional right basilar pneumonia versus atelectasis. Electronically Signed   By: Sebastian AcheAllen  Grady M.D.   On: 09/08/2018 11:35   Dg Chest Port 1 View  Result Date: 08/31/2018 CLINICAL DATA:  Fever, chills and chest pain today. Sore throat for 2-3 days. EXAM: PORTABLE CHEST 1 VIEW COMPARISON:  Portable chest 01/06/2016.  CT chest 11/07/2016. FINDINGS: 1702 hours. Stable cardiomegaly and mild aortic atherosclerosis. Probable mild left basilar atelectasis, exaggerated by overlying soft tissues. No edema, confluent airspace opacity, pleural effusion or pneumothorax identified. The bones appear unremarkable. Telemetry leads overlie the chest. IMPRESSION: No suspected acute findings. Cardiomegaly with mild left basilar atelectasis. Electronically Signed   By: Carey BullocksWilliam  Veazey M.D.   On: 08/31/2018 17:38   Ct Maxillofacial Wo Contrast  Result Date: 09/08/2018 CLINICAL DATA:  Facial pain after syncope and fall today. EXAM: CT HEAD WITHOUT CONTRAST CT  MAXILLOFACIAL WITHOUT CONTRAST TECHNIQUE: Multidetector CT imaging of the head and maxillofacial structures were performed using the standard protocol without intravenous contrast. Multiplanar CT image reconstructions of the maxillofacial structures were also generated. COMPARISON:  None. FINDINGS: CT HEAD FINDINGS Brain: No evidence of acute infarction, hemorrhage, hydrocephalus, extra-axial collection or mass lesion/mass effect. Vascular: No hyperdense vessel or unexpected calcification. Skull: Normal. Negative for fracture or focal lesion. Other: None. CT MAXILLOFACIAL FINDINGS Osseous: No fracture or mandibular dislocation. No destructive process. Orbits: Negative. No traumatic or inflammatory finding. Sinuses: Clear. Soft tissues: Negative. IMPRESSION: Normal head CT. No definite abnormality seen in maxillofacial region. Electronically Signed   By: Lupita RaiderJames  Green Jr M.D.   On: 09/08/2018 12:08    Micro Results     No results found for this or any previous visit (from the past 240 hour(s)).  Today   Subjective    Caitlyn [name] today has no headache,no chest abdominal pain,no new weakness tingling or numbness, feels much better wants to go home today.     Objective   Blood pressure 123/63, pulse 64, temperature 98.2 F (36.8 C), temperature source Oral, resp. rate 19, height 5\' 5"  (1.651 m), weight 120.3 kg, SpO2 97 %.   Intake/Output Summary (Last 24 hours) at 09/13/2018 1034 Last data filed at 09/13/2018 0931 Gross per 24 hour  Intake 733.26 ml  Output --  Net 733.26 ml  Exam  Awake Alert, Oriented x 3, No new F.N deficits, Normal affect [place].AT,PERRAL Supple Neck,No JVD, No cervical lymphadenopathy appriciated.  Symmetrical Chest wall movement, Good air movement bilaterally, CTAB RRR,No Gallops,Rubs or new Murmurs, No Parasternal Heave +ve B.Sounds, Abd Soft, Non tender, No organomegaly appriciated, No rebound -guarding or rigidity. No Cyanosis, Clubbing or edema, No new Rash  or bruise   Data Review   CBC w Diff:  Lab Results  Component Value Date   WBC 8.3 09/12/2018   HGB 12.5 09/12/2018   HCT 39.1 09/12/2018   PLT 353 09/12/2018   LYMPHOPCT 18 09/12/2018   MONOPCT 7 09/12/2018   EOSPCT 0 09/12/2018   BASOPCT 0 09/12/2018    CMP:  Lab Results  Component Value Date   NA 138 09/12/2018   NA 138 01/13/2018   K 3.5 09/12/2018   CL 100 09/12/2018   CO2 27 09/12/2018   BUN 26 (H) 09/12/2018   BUN 33 (H) 01/13/2018   CREATININE 0.83 09/12/2018   PROT 6.4 (L) 09/12/2018   PROT 7.8 01/13/2018   ALBUMIN 3.0 (L) 09/12/2018   ALBUMIN 4.6 01/13/2018   BILITOT 0.1 (L) 09/12/2018   BILITOT 0.2 01/13/2018   ALKPHOS 48 09/12/2018   AST 29 09/12/2018   ALT 50 (H) 09/12/2018  . COVID-19 Labs  Recent Labs    09/11/18 0425 09/12/18 0412 09/13/18 0247  DDIMER 0.73* 0.51*  --   FERRITIN 394* 351*  --   LDH 317* 256* 257*  CRP 2.3* 1.0*  --     Lab Results  Component Value Date   SARSCOV2NAA DETECTED (A) 08/31/2018     Total Time in preparing paper work, data evaluation and todays exam - 35 minutes  Lala Lund M.D on 09/13/2018 at 10:34 AM  [facility]   Office  [phone]

## 2018-10-01 ENCOUNTER — Ambulatory Visit
Admission: RE | Admit: 2018-10-01 | Discharge: 2018-10-01 | Disposition: A | Discharge status: Home / Self Care | Payer: Medicare Other | Source: Ambulatory Visit | Attending: Cardiology | Admitting: Cardiology

## 2018-10-01 ENCOUNTER — Other Ambulatory Visit: Payer: Self-pay | Admitting: Cardiology

## 2018-10-01 DIAGNOSIS — R059 Cough, unspecified: Secondary | ICD-10-CM

## 2018-10-01 DIAGNOSIS — R05 Cough: Secondary | ICD-10-CM

## 2018-10-20 ENCOUNTER — Other Ambulatory Visit: Payer: Medicare HMO

## 2018-10-20 ENCOUNTER — Other Ambulatory Visit: Payer: Self-pay

## 2018-10-20 DIAGNOSIS — Z20822 Contact with and (suspected) exposure to covid-19: Secondary | ICD-10-CM

## 2018-10-21 LAB — NOVEL CORONAVIRUS, NAA: SARS-CoV-2, NAA: NOT DETECTED

## 2018-10-27 ENCOUNTER — Ambulatory Visit
Admission: RE | Admit: 2018-10-27 | Discharge: 2018-10-27 | Disposition: A | Discharge status: Home / Self Care | Payer: Medicare Other | Source: Ambulatory Visit | Attending: Internal Medicine | Admitting: Internal Medicine

## 2018-10-27 ENCOUNTER — Other Ambulatory Visit: Payer: Self-pay

## 2018-10-27 DIAGNOSIS — N6489 Other specified disorders of breast: Secondary | ICD-10-CM

## 2018-10-28 ENCOUNTER — Other Ambulatory Visit: Payer: Self-pay | Admitting: *Deleted

## 2018-10-28 NOTE — Patient Outreach (Signed)
[facility]) Care Management  10/28/2018  KRISTALYN BERGSTRESSER [birth year] [id number]   Care coordination  [facility] RN CM received a call from pt  HIPPA verified dob and address She has her letter [facility] RN CM sent her in June 2020 when case closed as she changed insurance to an non [facility] participating insurance [facility] RN CM reviewed her letter with her and encouraged her to contact the [employer] health spring CM program and resources listed She voiced clarity, understanding and appreciation for [facility] RN CM answering her questions She states Dr Sandi Mariscal is no longer her MD after July 2020. [facility] RN CM removed him from the care team per her request    [place]. Lavina Hamman, RN, BSN, [employer] Coordinator Office number [phone] Mobile number [phone]  Main [facility] number [phone] Fax number [phone]

## 2018-11-03 ENCOUNTER — Other Ambulatory Visit: Payer: Self-pay

## 2018-11-03 DIAGNOSIS — Z20822 Contact with and (suspected) exposure to covid-19: Secondary | ICD-10-CM

## 2018-11-04 LAB — NOVEL CORONAVIRUS, NAA: SARS-CoV-2, NAA: NOT DETECTED

## 2018-12-01 ENCOUNTER — Other Ambulatory Visit: Payer: Self-pay | Admitting: Emergency Medicine

## 2018-12-01 DIAGNOSIS — Z20822 Contact with and (suspected) exposure to covid-19: Secondary | ICD-10-CM

## 2018-12-02 LAB — NOVEL CORONAVIRUS, NAA: SARS-CoV-2, NAA: NOT DETECTED

## 2018-12-08 ENCOUNTER — Other Ambulatory Visit: Payer: Self-pay | Admitting: Emergency Medicine

## 2018-12-08 DIAGNOSIS — Z20822 Contact with and (suspected) exposure to covid-19: Secondary | ICD-10-CM

## 2018-12-10 LAB — NOVEL CORONAVIRUS, NAA: SARS-CoV-2, NAA: NOT DETECTED

## 2018-12-23 ENCOUNTER — Other Ambulatory Visit: Payer: Self-pay

## 2018-12-23 DIAGNOSIS — Z20822 Contact with and (suspected) exposure to covid-19: Secondary | ICD-10-CM

## 2018-12-25 LAB — NOVEL CORONAVIRUS, NAA: SARS-CoV-2, NAA: NOT DETECTED

## 2019-02-04 ENCOUNTER — Other Ambulatory Visit: Payer: Self-pay

## 2019-02-04 DIAGNOSIS — Z20822 Contact with and (suspected) exposure to covid-19: Secondary | ICD-10-CM

## 2019-02-07 ENCOUNTER — Telehealth: Payer: Self-pay

## 2019-02-07 LAB — NOVEL CORONAVIRUS, NAA: SARS-CoV-2, NAA: NOT DETECTED

## 2019-02-07 NOTE — Telephone Encounter (Signed)
Patient given negative result and verbalized understanding  

## 2019-03-03 ENCOUNTER — Other Ambulatory Visit: Payer: Medicare Other

## 2019-03-04 ENCOUNTER — Ambulatory Visit: Payer: Medicare Other | Attending: Internal Medicine

## 2019-03-04 DIAGNOSIS — Z20822 Contact with and (suspected) exposure to covid-19: Secondary | ICD-10-CM

## 2019-03-05 LAB — NOVEL CORONAVIRUS, NAA: SARS-CoV-2, NAA: NOT DETECTED

## 2019-03-30 DIAGNOSIS — Z86718 Personal history of other venous thrombosis and embolism: Secondary | ICD-10-CM | POA: Insufficient documentation

## 2019-04-02 DIAGNOSIS — G8929 Other chronic pain: Secondary | ICD-10-CM | POA: Insufficient documentation

## 2019-04-02 DIAGNOSIS — M545 Low back pain, unspecified: Secondary | ICD-10-CM | POA: Insufficient documentation

## 2019-04-04 ENCOUNTER — Ambulatory Visit: Payer: Medicare HMO | Attending: Internal Medicine

## 2019-04-04 DIAGNOSIS — Z20822 Contact with and (suspected) exposure to covid-19: Secondary | ICD-10-CM

## 2019-04-05 LAB — NOVEL CORONAVIRUS, NAA: SARS-CoV-2, NAA: NOT DETECTED

## 2019-04-19 DIAGNOSIS — Z8639 Personal history of other endocrine, nutritional and metabolic disease: Secondary | ICD-10-CM | POA: Insufficient documentation

## 2019-04-25 ENCOUNTER — Ambulatory Visit: Payer: Medicare HMO | Attending: Internal Medicine

## 2019-04-25 DIAGNOSIS — Z20822 Contact with and (suspected) exposure to covid-19: Secondary | ICD-10-CM

## 2019-04-26 LAB — NOVEL CORONAVIRUS, NAA: SARS-CoV-2, NAA: NOT DETECTED

## 2019-05-16 ENCOUNTER — Ambulatory Visit: Payer: Medicare HMO | Attending: Internal Medicine

## 2019-05-16 ENCOUNTER — Other Ambulatory Visit: Payer: Medicare HMO

## 2019-05-16 DIAGNOSIS — Z20822 Contact with and (suspected) exposure to covid-19: Secondary | ICD-10-CM

## 2019-05-17 LAB — NOVEL CORONAVIRUS, NAA: SARS-CoV-2, NAA: NOT DETECTED

## 2019-06-15 ENCOUNTER — Ambulatory Visit: Payer: Medicare Other | Attending: Internal Medicine

## 2019-06-15 DIAGNOSIS — Z20822 Contact with and (suspected) exposure to covid-19: Secondary | ICD-10-CM

## 2019-06-16 LAB — NOVEL CORONAVIRUS, NAA: SARS-CoV-2, NAA: NOT DETECTED

## 2019-08-29 ENCOUNTER — Other Ambulatory Visit: Payer: Self-pay

## 2019-08-29 ENCOUNTER — Other Ambulatory Visit (HOSPITAL_COMMUNITY): Payer: Self-pay | Admitting: Family Medicine

## 2019-08-29 ENCOUNTER — Ambulatory Visit (HOSPITAL_COMMUNITY)
Admission: RE | Admit: 2019-08-29 | Discharge: 2019-08-29 | Disposition: A | Discharge status: Home / Self Care | Payer: Medicare Other | Source: Ambulatory Visit | Attending: Surgery | Admitting: Surgery

## 2019-08-29 DIAGNOSIS — I824Y1 Acute embolism and thrombosis of unspecified deep veins of right proximal lower extremity: Secondary | ICD-10-CM | POA: Diagnosis present

## 2019-09-28 ENCOUNTER — Ambulatory Visit: Payer: Medicare Other | Attending: Internal Medicine

## 2019-09-28 DIAGNOSIS — Z20822 Contact with and (suspected) exposure to covid-19: Secondary | ICD-10-CM

## 2019-09-29 LAB — SARS-COV-2, NAA 2 DAY TAT

## 2019-09-29 LAB — NOVEL CORONAVIRUS, NAA: SARS-CoV-2, NAA: NOT DETECTED

## 2019-10-01 ENCOUNTER — Ambulatory Visit: Payer: Medicare Other

## 2019-10-13 ENCOUNTER — Other Ambulatory Visit: Payer: Self-pay

## 2019-10-13 DIAGNOSIS — I739 Peripheral vascular disease, unspecified: Secondary | ICD-10-CM

## 2019-10-14 ENCOUNTER — Ambulatory Visit (INDEPENDENT_AMBULATORY_CARE_PROVIDER_SITE_OTHER): Payer: Medicare Other | Admitting: Internal Medicine

## 2019-10-14 ENCOUNTER — Encounter: Payer: Self-pay | Admitting: Internal Medicine

## 2019-10-14 ENCOUNTER — Other Ambulatory Visit: Payer: Self-pay

## 2019-10-14 VITALS — BP 138/82 | HR 63 | Temp 98.1°F | Wt 263.0 lb

## 2019-10-14 DIAGNOSIS — F5101 Primary insomnia: Secondary | ICD-10-CM

## 2019-10-14 DIAGNOSIS — G4733 Obstructive sleep apnea (adult) (pediatric): Secondary | ICD-10-CM

## 2019-10-14 DIAGNOSIS — G4700 Insomnia, unspecified: Secondary | ICD-10-CM | POA: Insufficient documentation

## 2019-10-14 NOTE — Progress Notes (Signed)
10/14/19- 68 yoF former smoker for sleep evaluation courtesy of Caitlyn Kroneroseylee Kuriakose, NP. Medical problem list includes PVD, Hx DVT/ Eliquis, HTN, CAD/ MI, Carotid Stenosis/ L stent, Hypothyroid,  Multiple Pulmonary Nodules, Obesity, Covid infection June, 2020,  -----sleep consult today Epworth score 7 No Covax CPAP/ [employer] ? Pressure    Nasal mask Main c/o is insomnia. "Hates CPAP". She complains of mask discomfort, overdrying. No longer sees Dr Earl Galasborne who originally started CPAP for OSA. Sounds as if she didn't get much f/u. She focuses on difficulty initiating and maintaining sleep at [place] since first MI around 5 years ago. Has used clonazepam 0.5 mg and asks refill, which I defer until OSA status understood.  Some help from Tylenol PM. 2 cups AM coffee. Says hx Graves Disease, now controlled. No ENT surgery.  Works in Print production plannermental health, usually day shift "depending how busy we are". Bedtime 10-11PM, latency "some times all night", wakes 2-3 times and sometimes can't get back to sleep.  No specific discomforts at night but various somatic complaints. Tosses and turns, but no recognized parasomnias. C/O tired during day, but has no chance to nap.   Prior to Admission medications   Medication Sig Start Date End Date Taking? Authorizing Provider  acetaminophen (TYLENOL) 500 MG tablet Take 1,000 mg by mouth every 6 (six) hours as needed for moderate pain.   Yes [provider]  albuterol (PROVENTIL HFA;VENTOLIN HFA) 108 (90 BASE) MCG/ACT inhaler Inhale 2 puffs into the lungs every 6 (six) hours as needed for wheezing or shortness of breath.    Yes [provider]  allopurinol (ZYLOPRIM) 100 MG tablet Take 1 tablet (100 mg total) by mouth daily. 08/22/14  Yes Newt LukesLeschber, Valerie A, MD  carvedilol (COREG) 3.125 MG tablet Take 3.125 mg by mouth 2 (two) times daily with a meal.   Yes [provider]  clonazePAM (KLONOPIN) 1 MG tablet Take 1 mg by mouth at bedtime.  10/05/15  Yes  [provider]  clopidogrel (PLAVIX) 75 MG tablet Take 75 mg by mouth daily.  12/16/17  Yes [provider]  colchicine (COLCRYS) 0.6 MG tablet Take 0.6 mg by mouth daily as needed (for flare ups). Flare ups 07/19/12  Yes Newt LukesLeschber, Valerie A, MD  ELIQUIS 5 MG TABS tablet Take 5 mg by mouth 2 (two) times daily. 10/08/19  Yes [provider]  furosemide (LASIX) 40 MG tablet Take 40 mg by mouth daily as needed for fluid or edema.    Yes [provider]  labetalol (NORMODYNE) 100 MG tablet Take 100 mg by mouth 2 (two) times daily. 09/23/19  Yes [provider]  levothyroxine (SYNTHROID, LEVOTHROID) 125 MCG tablet Take 125 mcg by mouth daily before breakfast.   Yes [provider]  methocarbamol (ROBAXIN) 500 MG tablet Take 500 mg by mouth every 8 (eight) hours as needed for muscle spasms.  12/03/15  Yes [provider]  nitroGLYCERIN (NITROSTAT) 0.4 MG SL tablet  03/25/18  Yes [provider]  OZEMPIC, 0.25 OR 0.5 MG/DOSE, 2 MG/1.5ML SOPN Inject into the skin. 09/27/19  Yes [provider]  potassium chloride (K-DUR) 10 MEQ tablet Take 10 mEq by mouth daily as needed. Taking 1 tablet when taking Lasix 06/14/18  Yes [provider]  rosuvastatin (CRESTOR) 10 MG tablet Take 10 mg by mouth at bedtime. 08/22/19  Yes [provider]  Vitamin D, Ergocalciferol, (DRISDOL) 50000 units CAPS capsule Take 50,000 Units by mouth every Monday.   Yes [provider]  aspirin 81 MG tablet Take 81 mg by mouth daily. Reported on 04/04/2015 Patient not taking: Reported on 10/14/2019    [provider]  atorvastatin (LIPITOR) 20 MG tablet Take 20 mg by mouth daily.  Patient not taking: Reported on 10/14/2019 08/31/15   [provider]  fluconazole (DIFLUCAN) 150 MG tablet Take 1 tablet (150 mg total) by mouth daily. Patient not taking: Reported on 09/08/2018 05/26/18   Currie Paris, NP  metFORMIN (GLUCOPHAGE)  500 MG tablet Take 1 tablet (500 mg total) by mouth daily with breakfast. Patient not taking: Reported on 10/14/2019 03/15/18   Corinna Capra A, DO  predniSONE (DELTASONE) 5 MG tablet Label  & dispense according to the schedule below. take 8 Pills PO for 3 days, 6 Pills PO for 3 days, 4 Pills PO for 3 days, 2 Pills PO for 3 days, 1 Pills PO for 3 days, 1/2 Pill  PO for 3 days then STOP. Total 65 pills. Patient not taking: Reported on 10/14/2019 09/13/18   Leroy Sea, MD  pseudoephedrine-acetaminophen (TYLENOL SINUS) 30-500 MG TABS tablet Take 2 tablets by mouth every 4 (four) hours as needed. Patient not taking: Reported on 10/14/2019    [provider]  terconazole (TERAZOL 7) 0.4 % vaginal cream Place 1 applicator vaginally at bedtime. Use for 7 days. Patient not taking: Reported on 09/08/2018 05/26/18   Currie Paris, NP   Past Medical History:  Diagnosis Date  . Anxiety   . Arthritis    "legs" (01/07/2016)  . Back pain   . CAD 06/2009   a.  s/p MI 4/11: tx with DES x 2 to RCA;  b.  LHC 03/17/11: LAD 30-40%, distal LAD 30-40%, OM2 40-50%, RCA stent patent, EF greater than 70%.;  c.  Lexiscan Myoview (12/15):  Mild apical thinning, no ischemia, EF 61%; normal study  . CAROTID STENOSIS    carotid Dopplers 03/25/11: RICA 60-79%; LICA 0-39%.  Follow up recommended in 6 months.  . Constipation   . Depression   . Gout    "on RX for it" (01/07/2016)  . Heart disease   . HYPERLIPIDEMIA   . HYPERTENSION   . HYPOTHYROIDISM    post ablation tx Graves  . Hypothyroidism   . Joint pain   . Myocardial infarction [facility]) 2012   "mini"  . OSA on CPAP   . Osteoarthritis   . Pre-diabetes   . PVD   . Sleep apnea   . TOBACCO ABUSE quit 06/2009  . Varicose veins   . VITAMIN D DEFICIENCY    DEXA 04/2009 normal   Past Surgical History:  Procedure Laterality Date  . CARDIAC CATHETERIZATION N/A 01/07/2016   Procedure: Left Heart Cath and Coronary Angiography;  Surgeon: Rinaldo Cloud, MD;   Location: [facility] INVASIVE CV LAB;  Service: Cardiovascular;  Laterality: N/A;  . CARDIAC CATHETERIZATION N/A 01/07/2016   Procedure: Coronary Stent Intervention;  Surgeon: Rinaldo Cloud, MD;  Location: [facility] INVASIVE CV LAB;  Service: Cardiovascular;  Laterality: N/A;  . CAROTID STENT Left    bilateral carotid artery disease post stent of left carotid  . CESAREAN SECTION  1985   "twins"  . CORONARY ANGIOPLASTY    . LEFT HEART CATHETERIZATION WITH CORONARY ANGIOGRAM N/A 03/17/2011   Procedure: LEFT HEART CATHETERIZATION WITH CORONARY ANGIOGRAM;  Surgeon: Herby Abraham, MD;  Location: [facility] CATH LAB;  Service: Cardiovascular;  Laterality: N/A;  . PERIPHERAL VASCULAR CATHETERIZATION N/A 04/06/2015   Procedure: Abdominal Aortogram;  Surgeon: Sherren Kerns, MD;  Location: [facility] INVASIVE CV LAB;  Service: Cardiovascular;  Laterality: N/A;  . RADIOACTIVE PLAQUE INSERTION     Graves disease post radioactive treatment complicated hypothyroidism   Family History  Problem Relation Age of Onset  . Heart attack Mother   . Stroke Mother   . Cancer Mother   . Varicose Veins Mother   . Thyroid disease Mother   . Cancer Father   . Heart disease Father   . Coronary artery disease Other   . Hypertension Other   . Hypertension Sister   . Breast cancer Paternal Aunt    Social History   Socioeconomic History  . Marital status: Divorced    Spouse name: Not on file  . Number of children: Not on file  . Years of education: Not on file  . Highest education level: Not on file  Occupational History  . Occupation: retired, Comptroller for mental health  Tobacco Use  . Smoking status: Former Smoker    Packs/day: 1.00    Years: 35.00    Pack years: 35.00    Quit date: 06/30/2009    Years since quitting: 10.2  . Smokeless tobacco: Never Used  Substance and Sexual Activity  . Alcohol use: Yes    Alcohol/week: 2.0 standard drinks    Types: 2 Glasses of wine per week    Comment: occ  . Drug use: No  . Sexual  activity: Not Currently    Comment: Quit smoking after MI- prev 1ppd x 35ys  Other Topics Concern  . Not on file  Social History Narrative  . Not on file   Social Determinants of Health   Financial Resource Strain:   . Difficulty of Paying Living Expenses:   Food Insecurity:   . Worried About Programme researcher, broadcasting/film/video in the Last Year:   . Barista in the Last Year:   Transportation Needs:   . Freight forwarder (Medical):   Marland Kitchen Lack of Transportation (Non-Medical):   Physical Activity:   . Days of Exercise per Week:   . Minutes of Exercise per Session:   Stress:   . Feeling of Stress :   Social Connections:   . Frequency of Communication with Friends and Family:   . Frequency of Social Gatherings with Friends and Family:   . Attends Religious Services:   . Active Member of Clubs or Organizations:   . Attends Banker Meetings:   Marland Kitchen Marital Status:   Intimate Partner Violence:   . Fear of Current or Ex-Partner:   . Emotionally Abused:   Marland Kitchen Physically Abused:   . Sexually Abused:    ROS-see HPI   + = positive Constitutional:    weight loss, night sweats, fevers, chills, fatigue, lassitude. HEENT:    headaches, difficulty swallowing, tooth/dental problems, sore throat,       sneezing, itching, ear ache, nasal congestion, post nasal drip, snoring CV:    chest pain, orthopnea, PND, swelling in lower extremities, anasarca,                                  dizziness, palpitations Resp:   shortness of breath with exertion or at rest.                productive cough,   non-productive cough, coughing up of blood.  change in color of mucus.  wheezing.   Skin:    rash or lesions. GI:  No-   heartburn, indigestion, abdominal pain, nausea, vomiting, diarrhea,                 change in bowel habits, loss of appetite GU: dysuria, change in color of urine, no urgency or frequency.   flank pain. MS:   joint pain, stiffness, decreased range of motion, back  pain. Neuro-     nothing unusual Psych:  change in mood or affect.  depression or anxiety.   memory loss.  OBJ- Physical Exam General- Alert, Oriented, Affect-appropriate, Distress- none acute, + obese Skin- rash-none, lesions- none, excoriation- none Lymphadenopathy- none Head- atraumatic            Eyes- Gross vision intact, PERRLA, conjunctivae and secretions clear            Ears- Hearing, canals-normal            Nose- Clear, no-Septal dev, mucus, polyps, erosion, perforation             Throat- Mallampati IV , mucosa clear , drainage- none, tonsils- atrophic,  +much dental repair, Neck- flexible , trachea midline, no stridor , thyroid nl, carotid no bruit Chest - symmetrical excursion , unlabored           Heart/CV- RRR , no murmur , no gallop  , no rub, nl s1 s2                           - JVD- none , edema- none, stasis changes- none, varices- none           Lung- clear to P&A, wheeze- none, cough- none , dullness-none, rub- none           Chest wall-  Abd-  Br/ Gen/ Rectal- Not done, not indicated Extrem- cyanosis- none, clubbing, none, atrophy- none, strength- nl Neuro- grossly intact to observation

## 2019-10-14 NOTE — Patient Instructions (Signed)
Order- schedule home sleep test   Dx OSA  Ok to take Tylenol PM the night of your sleep study  Please call us about 2 weeks after your sleep test to see if results and recommendations are ready yet.   Order- Contact [employer] [facility] for CPAP settings and recent download and age of machine

## 2019-10-14 NOTE — Assessment & Plan Note (Signed)
Not clear if there are organic issues, given hx ASCVD with carotid stent. Clonazepam seemed to help. We need to reassess OSA with updated CPAP before potentially depressing respiration with a sleep med. Discussed with her.  Plan- consider renewing clonazepam or trying other options after sleep study.

## 2019-10-14 NOTE — Assessment & Plan Note (Addendum)
We need to update OSA documentation before we try to addres her concern about insomnia. She dislikes her CPAP and uses only intermittently. Might be a candidate for an oral appliance, but doubt surgical candidate.  Plan- sleep study

## 2019-10-27 ENCOUNTER — Ambulatory Visit (INDEPENDENT_AMBULATORY_CARE_PROVIDER_SITE_OTHER): Payer: Medicare Other | Admitting: Vascular Surgery

## 2019-10-27 ENCOUNTER — Ambulatory Visit (HOSPITAL_COMMUNITY)
Admission: RE | Admit: 2019-10-27 | Discharge: 2019-10-27 | Disposition: A | Discharge status: Home / Self Care | Payer: Medicare Other | Source: Ambulatory Visit | Attending: Vascular Surgery | Admitting: Vascular Surgery

## 2019-10-27 ENCOUNTER — Encounter: Payer: Self-pay | Admitting: Vascular Surgery

## 2019-10-27 ENCOUNTER — Other Ambulatory Visit: Payer: Self-pay

## 2019-10-27 VITALS — BP 194/108 | HR 70 | Temp 97.6°F | Resp 20 | Ht 65.0 in | Wt 260.0 lb

## 2019-10-27 DIAGNOSIS — I6523 Occlusion and stenosis of bilateral carotid arteries: Secondary | ICD-10-CM

## 2019-10-27 DIAGNOSIS — I739 Peripheral vascular disease, unspecified: Secondary | ICD-10-CM

## 2019-10-27 DIAGNOSIS — I825Y1 Chronic embolism and thrombosis of unspecified deep veins of right proximal lower extremity: Secondary | ICD-10-CM | POA: Diagnosis not present

## 2019-10-27 NOTE — Progress Notes (Signed)
Referring Physician: Caffie Damme  Patient name: Caitlyn [name] MRN: [id number] DOB: Jul 13, 1950 Sex: female  REASON FOR CONSULT: Right knee pain, right leg DVT  HPI: Caitlyn [name] is a 69 y.o. female, who developed a right popliteal DVT after COVID-19 in September 2020.  She has been on Eliquis since then.  She was already chronically on aspirin for peripheral arterial disease and carotid occlusive disease.  She states she never developed pulmonary embolus.  She has no shortness of breath.  She states the swelling in her right leg has significantly improved.  She was previously seen by me in 2016 with mild superficial venous reflux.  At that time compression stockings were recommended.  She currently is not wearing them she states they are too hard to apply.  She also complains of left hip pain and right knee pain.  She states she is under evaluation for possible left total hip replacement.  She has pain in her knee on the right side when walking as well as her left hip.  She does not really describe claudication.  She has known peripheral arterial disease and has previously had an arteriogram by me in 2017 which showed mild right superficial femoral artery stenosis at the origin.  Other medical problems include tonic back pain, coronary artery disease, bilateral carotid stenosis (60 to 80% right 40 to 60% left in 2015), hyperlipidemia, hypertension all of which have been stable.  Past Medical History:  Diagnosis Date  . Anxiety   . Arthritis    "legs" (01/07/2016)  . Back pain   . CAD 06/2009   a.  s/p MI 4/11: tx with DES x 2 to RCA;  b.  LHC 03/17/11: LAD 30-40%, distal LAD 30-40%, OM2 40-50%, RCA stent patent, EF greater than 70%.;  c.  Lexiscan Myoview (12/15):  Mild apical thinning, no ischemia, EF 61%; normal study  . CAROTID STENOSIS    carotid Dopplers 03/25/11: RICA 60-79%; LICA 0-39%.  Follow up recommended in 6 months.  . Constipation   . Depression   . Gout    "on RX for it"  (01/07/2016)  . Heart disease   . HYPERLIPIDEMIA   . HYPERTENSION   . HYPOTHYROIDISM    post ablation tx Graves  . Hypothyroidism   . Joint pain   . Myocardial infarction [facility]) 2012   "mini"  . OSA on CPAP   . Osteoarthritis   . Pre-diabetes   . PVD   . Sleep apnea   . TOBACCO ABUSE quit 06/2009  . Varicose veins   . VITAMIN D DEFICIENCY    DEXA 04/2009 normal   Past Surgical History:  Procedure Laterality Date  . CARDIAC CATHETERIZATION N/A 01/07/2016   Procedure: Left Heart Cath and Coronary Angiography;  Surgeon: Rinaldo Cloud, MD;  [facility] INVASIVE CV LAB;  Service: Cardiovascular;  Laterality: N/A;  . CARDIAC CATHETERIZATION N/A 01/07/2016   Procedure: Coronary Stent Intervention;  Surgeon: Rinaldo Cloud, MD;  Location: [facility] INVASIVE CV LAB;  Service: Cardiovascular;  Laterality: N/A;  . CAROTID STENT Left    bilateral carotid artery disease post stent of left carotid  . CESAREAN SECTION  1985   "twins"  . CORONARY ANGIOPLASTY    . LEFT HEART CATHETERIZATION WITH CORONARY ANGIOGRAM N/A 03/17/2011   Procedure: LEFT HEART CATHETERIZATION WITH CORONARY ANGIOGRAM;  Surgeon: Herby Abraham, MD;  Location: [facility] CATH LAB;  Service: Cardiovascular;  Laterality: N/A;  . PERIPHERAL VASCULAR CATHETERIZATION N/A 04/06/2015   Procedure: Abdominal  Aortogram;  Surgeon: Sherren Kernsharles E Ahaan Zobrist, MD;  [facility];  Service: Cardiovascular;  Laterality: N/A;  . RADIOACTIVE PLAQUE INSERTION     Graves disease post radioactive treatment complicated hypothyroidism    Family History  Problem Relation Age of Onset  . Heart attack Mother   . Stroke Mother   . Cancer Mother   . Varicose Veins Mother   . Thyroid disease Mother   . Cancer Father   . Heart disease Father   . Coronary artery disease Other   . Hypertension Other   . Hypertension Sister   . Breast cancer Paternal Aunt     SOCIAL HISTORY: Social History   Socioeconomic History  . Marital status: Divorced     Spouse name: Not on file  . Number of children: Not on file  . Years of education: Not on file  . Highest education level: Not on file  Occupational History  . Occupation: retired, Comptrollersitter for mental health  Tobacco Use  . Smoking status: Former Smoker    Packs/day: 1.00    Years: 35.00    Pack years: 35.00    Quit date: 06/30/2009    Years since quitting: 10.3  . Smokeless tobacco: Never Used  Substance and Sexual Activity  . Alcohol use: Yes    Alcohol/week: 2.0 standard drinks    Types: 2 Glasses of wine per week    Comment: occ  . Drug use: No  . Sexual activity: Not Currently    Comment: Quit smoking after MI- prev 1ppd x 35ys  Other Topics Concern  . Not on file  Social History Narrative  . Not on file   Social Determinants of Health   Financial Resource Strain:   . Difficulty of Paying Living Expenses:   Food Insecurity:   . Worried About Programme researcher, broadcasting/film/videounning Out of Food in the Last Year:   . Baristaan Out of Food in the Last Year:   Transportation Needs:   . Freight forwarderLack of Transportation (Medical):   Marland Kitchen. Lack of Transportation (Non-Medical):   Physical Activity:   . Days of Exercise per Week:   . Minutes of Exercise per Session:   Stress:   . Feeling of Stress :   Social Connections:   . Frequency of Communication with Friends and Family:   . Frequency of Social Gatherings with Friends and Family:   . Attends Religious Services:   . Active Member of Clubs or Organizations:   . Attends BankerClub or Organization Meetings:   Marland Kitchen. Marital Status:   Intimate Partner Violence:   . Fear of Current or Ex-Partner:   . Emotionally Abused:   Marland Kitchen. Physically Abused:   . Sexually Abused:     Allergies  Allergen Reactions  . Amlodipine Swelling  . Statins Other (See Comments)    myalgia    Current Outpatient Medications  Medication Sig Dispense Refill  . acetaminophen (TYLENOL) 500 MG tablet Take 1,000 mg by mouth every 6 (six) hours as needed for moderate pain.    Marland Kitchen. albuterol (PROVENTIL  HFA;VENTOLIN HFA) 108 (90 BASE) MCG/ACT inhaler Inhale 2 puffs into the lungs every 6 (six) hours as needed for wheezing or shortness of breath.     . allopurinol (ZYLOPRIM) 100 MG tablet Take 1 tablet (100 mg total) by mouth daily. 90 tablet 0  . atorvastatin (LIPITOR) 20 MG tablet Take 20 mg by mouth daily.     . carvedilol (COREG) 3.125 MG tablet Take 3.125 mg by mouth 2 (two)  times daily with a meal.    . clonazePAM (KLONOPIN) 1 MG tablet Take 1 mg by mouth at bedtime.     . clopidogrel (PLAVIX) 75 MG tablet Take 75 mg by mouth daily.   2  . colchicine (COLCRYS) 0.6 MG tablet Take 0.6 mg by mouth daily as needed (for flare ups). Flare ups    . ELIQUIS 5 MG TABS tablet Take 5 mg by mouth 2 (two) times daily.    . furosemide (LASIX) 40 MG tablet Take 40 mg by mouth daily as needed for fluid or edema.     Marland Kitchen levothyroxine (SYNTHROID, LEVOTHROID) 125 MCG tablet Take 125 mcg by mouth daily before breakfast.    . methocarbamol (ROBAXIN) 500 MG tablet Take 500 mg by mouth every 8 (eight) hours as needed for muscle spasms.     . nitroGLYCERIN (NITROSTAT) 0.4 MG SL tablet     . OZEMPIC, 0.25 OR 0.5 MG/DOSE, 2 MG/1.5ML SOPN Inject into the skin.    . potassium chloride (K-DUR) 10 MEQ tablet Take 10 mEq by mouth daily as needed. Taking 1 tablet when taking Lasix    . pseudoephedrine-acetaminophen (TYLENOL SINUS) 30-500 MG TABS tablet Take 2 tablets by mouth every 4 (four) hours as needed.     . rosuvastatin (CRESTOR) 10 MG tablet Take 10 mg by mouth at bedtime.    . Vitamin D, Ergocalciferol, (DRISDOL) 50000 units CAPS capsule Take 50,000 Units by mouth every Monday.    Marland Kitchen aspirin 81 MG tablet Take 81 mg by mouth daily. Reported on 04/04/2015 (Patient not taking: Reported on 10/14/2019)    . labetalol (NORMODYNE) 100 MG tablet Take 100 mg by mouth 2 (two) times daily. (Patient not taking: Reported on 10/27/2019)    . metFORMIN (GLUCOPHAGE) 500 MG tablet Take 1 tablet (500 mg total) by mouth daily with  breakfast. (Patient not taking: Reported on 10/14/2019) 30 tablet 0   No current facility-administered medications for this visit.    ROS:   General:  No weight loss, Fever, chills  HEENT: No recent headaches, no nasal bleeding, no visual changes, no sore throat  Neurologic: No dizziness, blackouts, seizures. No recent symptoms of stroke or mini- stroke. No recent episodes of slurred speech, or temporary blindness.  Cardiac: No recent episodes of chest pain/pressure, no shortness of breath at rest.  + shortness of breath with exertion.  Denies history of atrial fibrillation or irregular heartbeat  Vascular: No history of rest pain in feet.  No history of claudication.  No history of non-healing ulcer, + history of DVT   Pulmonary: No home oxygen, no productive cough, no hemoptysis,  No asthma or wheezing  Musculoskeletal:  [X]  Arthritis, [X]  Low back pain,  [X]  Joint pain  Hematologic:No history of hypercoagulable state.  No history of easy bleeding.  No history of anemia  Gastrointestinal: No hematochezia or melena,  No gastroesophageal reflux, no trouble swallowing  Urinary: [ ]  chronic Kidney disease, [ ]  on HD - [ ]  MWF or [ ]  TTHS, [ ]  Burning with urination, [ ]  Frequent urination, [ ]  Difficulty urinating;   Skin: No rashes  Psychological: No history of anxiety,  No history of depression   Physical Examination  Vitals:   10/27/19 1144  BP: (!) 194/108  Pulse: 70  Resp: 20  Temp: 97.6 F (36.4 C)  SpO2: 95%  Weight: 260 lb (117.9 kg)  Height: 5\' 5"  (1.651 m)    Body mass index is 43.27 kg/m.  General:  Alert and oriented, no acute distress HEENT: Normal Neck: No JVD Cardiac: Regular Rate and Rhythm Skin: No rash Extremity Pulses:  2+ radial, brachial, femoral, absent dorsalis pedis, posterior tibial pulses bilaterally Musculoskeletal: No deformity or edema  Neurologic: Upper and lower extremity motor 5/5 and symmetric  DATA:  I reviewed the patient's  duplex ultrasound from June 2021.  This shows a chronic DVT in the right superficial femoral and popliteal vein which is nonocclusive.   Patient also had bilateral ABIs today.  Right side was 0.8 left side 1.1 this is essentially unchanged from 2018 when the right side was 0.7 on the left side was 1.1  ASSESSMENT: 1.  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  Usual course of treatment would be 3 months for an acute DVT.  She has now been on anticoagulation for almost a year.  I will leave it at the discretion of her primary care provider whether or not they wish to discontinue this at this point.  2.  Peripheral arterial disease mild I do not believe this is the etiology of her right knee pain.  Her ABIs are unchanged in the last 3 years.  Continue statin and aspirin ambulation as much as possible.  Would consider intervention if she develops rest pain or tissue loss in her right foot over time.  We will repeat her ABIs in 1 year and evaluate in our APP clinic.  3.  Bilateral mild to moderate carotid occlusive disease.  I cannot find any evidence that she has had a carotid duplex scan since 2015.  We will repeat her carotid duplex scan in 1 year and evaluate her again in our APP clinic  PLAN: See above   Fabienne Bruns, MD [facility] Office: [phone]

## 2019-11-01 ENCOUNTER — Other Ambulatory Visit: Payer: Medicare Other

## 2019-11-01 ENCOUNTER — Other Ambulatory Visit: Payer: Self-pay

## 2019-11-01 DIAGNOSIS — Z20822 Contact with and (suspected) exposure to covid-19: Secondary | ICD-10-CM

## 2019-11-02 LAB — NOVEL CORONAVIRUS, NAA: SARS-CoV-2, NAA: NOT DETECTED

## 2019-11-02 LAB — SARS-COV-2, NAA 2 DAY TAT

## 2019-11-14 ENCOUNTER — Other Ambulatory Visit: Payer: Self-pay

## 2019-11-14 ENCOUNTER — Ambulatory Visit: Payer: Medicare Other

## 2019-11-14 DIAGNOSIS — G4733 Obstructive sleep apnea (adult) (pediatric): Secondary | ICD-10-CM

## 2019-11-16 DIAGNOSIS — G4733 Obstructive sleep apnea (adult) (pediatric): Secondary | ICD-10-CM | POA: Diagnosis not present

## 2019-12-02 ENCOUNTER — Other Ambulatory Visit: Payer: Medicare Other

## 2019-12-02 ENCOUNTER — Other Ambulatory Visit: Payer: Self-pay

## 2019-12-02 DIAGNOSIS — Z20822 Contact with and (suspected) exposure to covid-19: Secondary | ICD-10-CM

## 2019-12-05 ENCOUNTER — Telehealth: Payer: Self-pay

## 2019-12-05 LAB — NOVEL CORONAVIRUS, NAA: SARS-CoV-2, NAA: NOT DETECTED

## 2019-12-05 NOTE — Telephone Encounter (Signed)
Negative COVID results given. Patient results "NOT Detected." Caller expressed understanding. ° °

## 2019-12-06 ENCOUNTER — Ambulatory Visit: Payer: Medicare Other | Attending: Internal Medicine

## 2019-12-06 DIAGNOSIS — Z23 Encounter for immunization: Secondary | ICD-10-CM

## 2019-12-06 NOTE — Progress Notes (Signed)
   Covid-19 Vaccination Clinic  Name:  Caitlyn [name]    MRN: [id number] DOB: [birth year]  12/06/2019  [name] was observed post Covid-19 immunization for 15 minutes without incident. She was provided with Vaccine Information Sheet and instruction to access the V-Safe system.   [name] was instructed to call 911 with any severe reactions post vaccine: Marland Kitchen Difficulty breathing  . Swelling of face and throat  . A fast heartbeat  . A bad rash all over body  . Dizziness and weakness   Immunizations Administered    Name Date Dose VIS Date Route   Pfizer COVID-19 Vaccine 12/06/2019  3:38 PM 0.3 mL 05/11/2018 Intramuscular   Manufacturer: [employer], [employer]   Lot: [id number]   NDC: [id number]

## 2019-12-26 ENCOUNTER — Other Ambulatory Visit: Payer: Self-pay | Admitting: Family Medicine

## 2019-12-26 DIAGNOSIS — Z1231 Encounter for screening mammogram for malignant neoplasm of breast: Secondary | ICD-10-CM

## 2019-12-27 ENCOUNTER — Ambulatory Visit: Payer: Medicare Other

## 2020-01-05 ENCOUNTER — Ambulatory Visit: Payer: Medicare Other

## 2020-01-18 ENCOUNTER — Encounter (INDEPENDENT_AMBULATORY_CARE_PROVIDER_SITE_OTHER): Payer: Self-pay

## 2020-01-23 ENCOUNTER — Ambulatory Visit: Payer: Medicare Other

## 2020-01-23 ENCOUNTER — Other Ambulatory Visit: Payer: Self-pay

## 2020-01-23 ENCOUNTER — Ambulatory Visit: Payer: Medicare Other | Attending: Internal Medicine

## 2020-01-23 ENCOUNTER — Ambulatory Visit (INDEPENDENT_AMBULATORY_CARE_PROVIDER_SITE_OTHER): Payer: Medicare Other | Admitting: Internal Medicine

## 2020-01-23 ENCOUNTER — Encounter: Payer: Self-pay | Admitting: Internal Medicine

## 2020-01-23 DIAGNOSIS — F5101 Primary insomnia: Secondary | ICD-10-CM | POA: Diagnosis not present

## 2020-01-23 DIAGNOSIS — J302 Other seasonal allergic rhinitis: Secondary | ICD-10-CM

## 2020-01-23 DIAGNOSIS — J3089 Other allergic rhinitis: Secondary | ICD-10-CM | POA: Diagnosis not present

## 2020-01-23 DIAGNOSIS — G4733 Obstructive sleep apnea (adult) (pediatric): Secondary | ICD-10-CM

## 2020-01-23 DIAGNOSIS — Z23 Encounter for immunization: Secondary | ICD-10-CM

## 2020-01-23 MED ORDER — CLONAZEPAM 1 MG PO TABS
1.0000 mg | ORAL_TABLET | Freq: Every day | ORAL | 5 refills | Status: DC
Start: 2020-01-23 — End: 2020-07-31

## 2020-01-23 NOTE — Patient Instructions (Signed)
Order- D[facility]  Please continue CPAP at current pressure, humidifier, mask of choice, supplies, Airview/ card Please send current download Sleep study has been updated  Script sent for clonazepam to help sleep at night. As discussed, our goal is for you to use CPAP every night. Do not take clonazepam if you aren't using CPAP- it could depress your breathing.   Use Flonase nasal spray  2 puffs each nostril every night at bedtime. See if it begins to help with the stuffy nose.   Please call as needed

## 2020-01-23 NOTE — Progress Notes (Signed)
HPI F former smoker followed for OSA, Insomnia, complicated by PVD, Hx DVT/ Eliquis, HTN, CAD/ MI, Carotid Stenosis/ L stent, Hypothyroid,  Multiple Pulmonary Nodules, Obesity, Covid infection June, 2020, hx Grave's Disease,  HST 11/14/19- AHI 44.9/ hr, desaturation to 80%, body weight 263 lbs =================================================  10/14/19- 68 yoF former smoker for sleep evaluation courtesy of Caitlyn Kroner, NP. Medical problem list includes PVD, Hx DVT/ Eliquis, HTN, CAD/ MI, Carotid Stenosis/ L stent, Hypothyroid,  Multiple Pulmonary Nodules, Obesity, Covid infection June, 2020,  -----sleep consult today Epworth score 7 No Covax CPAP/ [facility] ? Pressure    Nasal mask Main c/o is insomnia. "Hates CPAP". She complains of mask discomfort, overdrying. No longer sees Dr [name] who originally started CPAP for OSA. Sounds as if she didn't get much f/u. She focuses on difficulty initiating and maintaining sleep at least since first MI around 5 years ago. Has used clonazepam 0.5 mg and asks refill, which I defer until OSA status understood.  Some help from Tylenol PM. 2 cups AM coffee. Says hx Graves Disease, now controlled. No ENT surgery.  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 Bedtime 10-11PM, latency "some times all night", wakes 2-3 times and sometimes can't get back to sleep.  No specific discomforts at night but various somatic complaints. Tosses and turns, but no recognized parasomnias. C/O tired during day, but has no chance to nap.   01/23/20- 68 yoF former smoker followed for OSA, Insomnia, complicated by PVD, Hx DVT/ Eliquis, HTN, CAD/ MI, Carotid Stenosis/ L stent, Hypothyroid,  Multiple Pulmonary Nodules, Obesity, Covid infection June, 2020,  HST 11/14/19- AHI 44.9/ hr, desaturation to 80%, body weight 263 lbs Body weight today 258 lbs Covid vax- 2 Phizer Flu vax- had CPAP   [facility] Discussed use of previous CPAP from Dr [name]- using  again. We need download from current machine. Will manage based on latest sleep study. Somatic bone and joint pains impact sleep.  Nasal congestion and drainage vary- partly seasonal.    ROS-see HPI   + = positive Constitutional:    weight loss, night sweats, fevers, chills, fatigue, lassitude. HEENT:    headaches, difficulty swallowing, tooth/dental problems, sore throat,       sneezing, itching, ear ache, nasal congestion, post nasal drip, snoring CV:    chest pain, orthopnea, PND, swelling in lower extremities, anasarca,                                  dizziness, palpitations Resp:   shortness of breath with exertion or at rest.                productive cough,   non-productive cough, coughing up of blood.              change in color of mucus.  wheezing.   Skin:    rash or lesions. GI:  No-   heartburn, indigestion, abdominal pain, nausea, vomiting, diarrhea,                 change in bowel habits, loss of appetite GU: dysuria, change in color of urine, no urgency or frequency.   flank pain. MS:   joint pain, stiffness, decreased range of motion, back pain. Neuro-     nothing unusual Psych:  change in mood or affect.  depression or anxiety.   memory loss.  OBJ- Physical Exam General- Alert, Oriented, Affect-appropriate,  Distress- none acute, + obese Skin- rash-none, lesions- none, excoriation- none Lymphadenopathy- none Head- atraumatic            Eyes- Gross vision intact, PERRLA, conjunctivae and secretions clear            Ears- Hearing, canals-normal            Nose- Clear, no-Septal dev, mucus, polyps, erosion, perforation             Throat- Mallampati IV , mucosa clear , drainage- none, tonsils- atrophic,  +much dental repair, Neck- flexible , trachea midline, no stridor , thyroid nl, carotid no bruit Chest - symmetrical excursion , unlabored           Heart/CV- RRR , no murmur , no gallop  , no rub, nl s1 s2                           - JVD- none , edema- none, stasis  changes- none, varices- none           Lung- clear to P&A, wheeze- none, cough- none , dullness-none, rub- none           Chest wall-  Abd-  Br/ Gen/ Rectal- Not done, not indicated Extrem- cyanosis- none, clubbing, none, atrophy- none, strength- nl Neuro- grossly intact to observation

## 2020-01-23 NOTE — Progress Notes (Signed)
   Covid-19 Vaccination Clinic  Name:  MARIEANNE MARXEN    MRN: [id number] DOB: [birth year]  01/23/2020  Ms. Silverio was observed post Covid-19 immunization for 15 minutes without incident. She was provided with Vaccine Information Sheet and instruction to access the V-Safe system.   Ms. Billick was instructed to call 911 with any severe reactions post vaccine: Marland Kitchen Difficulty breathing  . Swelling of face and throat  . A fast heartbeat  . A bad rash all over body  . Dizziness and weakness   Immunizations Administered    Name Date Dose VIS Date Route   Pfizer COVID-19 Vaccine 01/23/2020 -- -- --   Pfizer COVID-19 Vaccine 01/23/2020  5:15 PM 0.3 mL 01/04/2020 Intramuscular   Manufacturer: [employer], [employer]   Lot: [id number]   NDC: [address]-8

## 2020-02-13 ENCOUNTER — Ambulatory Visit: Payer: Medicare Other

## 2020-02-17 DIAGNOSIS — J302 Other seasonal allergic rhinitis: Secondary | ICD-10-CM | POA: Insufficient documentation

## 2020-02-17 NOTE — Assessment & Plan Note (Signed)
Various somatic discomforts impact sleep and CPAP use. We discussed safe use of meds. Plan try clonazepam

## 2020-02-17 NOTE — Assessment & Plan Note (Signed)
Not clear how much is allergic vs vasomotor,  But interferes with CPAP use. Plan- try consistent use of flonase as discussed.

## 2020-02-17 NOTE — Assessment & Plan Note (Signed)
We need download from [facility] to get current settings and verify compliance. OSA is in range best addressed by CPAP.

## 2020-02-28 ENCOUNTER — Other Ambulatory Visit: Payer: Medicare Other

## 2020-02-28 DIAGNOSIS — Z20822 Contact with and (suspected) exposure to covid-19: Secondary | ICD-10-CM

## 2020-02-29 LAB — SARS-COV-2, NAA 2 DAY TAT

## 2020-02-29 LAB — NOVEL CORONAVIRUS, NAA: SARS-CoV-2, NAA: NOT DETECTED

## 2020-03-01 ENCOUNTER — Telehealth: Payer: Self-pay | Admitting: General Practice

## 2020-03-01 NOTE — Telephone Encounter (Signed)
Patient called to get COVID results. Made her aware they were negative. 

## 2020-03-05 ENCOUNTER — Ambulatory Visit
Admission: RE | Admit: 2020-03-05 | Discharge: 2020-03-05 | Disposition: A | Discharge status: Home / Self Care | Payer: Medicare Other | Source: Ambulatory Visit | Attending: Family Medicine | Admitting: Family Medicine

## 2020-03-05 ENCOUNTER — Other Ambulatory Visit: Payer: Self-pay

## 2020-03-05 DIAGNOSIS — Z1231 Encounter for screening mammogram for malignant neoplasm of breast: Secondary | ICD-10-CM

## 2020-03-20 ENCOUNTER — Other Ambulatory Visit: Payer: Medicare Other

## 2020-03-20 DIAGNOSIS — Z20822 Contact with and (suspected) exposure to covid-19: Secondary | ICD-10-CM

## 2020-03-22 LAB — NOVEL CORONAVIRUS, NAA: SARS-CoV-2, NAA: DETECTED — AB

## 2020-03-22 LAB — SARS-COV-2, NAA 2 DAY TAT

## 2020-03-28 ENCOUNTER — Other Ambulatory Visit: Payer: Medicare Other

## 2020-03-28 DIAGNOSIS — Z20822 Contact with and (suspected) exposure to covid-19: Secondary | ICD-10-CM

## 2020-03-29 ENCOUNTER — Encounter (HOSPITAL_COMMUNITY): Payer: Self-pay

## 2020-03-31 LAB — SPECIMEN STATUS REPORT

## 2020-03-31 LAB — NOVEL CORONAVIRUS, NAA: SARS-CoV-2, NAA: NOT DETECTED

## 2020-04-10 ENCOUNTER — Other Ambulatory Visit: Payer: Medicare Other

## 2020-04-10 ENCOUNTER — Other Ambulatory Visit: Payer: Self-pay

## 2020-04-10 DIAGNOSIS — Z20822 Contact with and (suspected) exposure to covid-19: Secondary | ICD-10-CM

## 2020-04-11 LAB — NOVEL CORONAVIRUS, NAA: SARS-CoV-2, NAA: NOT DETECTED

## 2020-04-11 LAB — SARS-COV-2, NAA 2 DAY TAT

## 2020-04-15 ENCOUNTER — Encounter (INDEPENDENT_AMBULATORY_CARE_PROVIDER_SITE_OTHER): Payer: Self-pay

## 2020-04-19 ENCOUNTER — Other Ambulatory Visit: Payer: Medicare Other

## 2020-04-20 ENCOUNTER — Other Ambulatory Visit: Payer: Self-pay

## 2020-04-20 ENCOUNTER — Other Ambulatory Visit: Payer: Medicare Other

## 2020-04-20 DIAGNOSIS — Z20822 Contact with and (suspected) exposure to covid-19: Secondary | ICD-10-CM

## 2020-04-21 LAB — SARS-COV-2, NAA 2 DAY TAT

## 2020-04-21 LAB — NOVEL CORONAVIRUS, NAA: SARS-CoV-2, NAA: NOT DETECTED

## 2020-04-30 ENCOUNTER — Ambulatory Visit: Payer: Medicare Other | Admitting: Internal Medicine

## 2020-05-03 ENCOUNTER — Encounter (INDEPENDENT_AMBULATORY_CARE_PROVIDER_SITE_OTHER): Payer: Self-pay | Admitting: Family Medicine

## 2020-05-03 ENCOUNTER — Ambulatory Visit (INDEPENDENT_AMBULATORY_CARE_PROVIDER_SITE_OTHER): Payer: Medicare Other | Admitting: Family Medicine

## 2020-05-03 ENCOUNTER — Other Ambulatory Visit: Payer: Self-pay

## 2020-05-03 VITALS — BP 138/86 | HR 61 | Temp 97.4°F | Ht 65.0 in | Wt 248.0 lb

## 2020-05-03 DIAGNOSIS — Z1331 Encounter for screening for depression: Secondary | ICD-10-CM

## 2020-05-03 DIAGNOSIS — Z0289 Encounter for other administrative examinations: Secondary | ICD-10-CM

## 2020-05-03 DIAGNOSIS — R0602 Shortness of breath: Secondary | ICD-10-CM

## 2020-05-03 DIAGNOSIS — R5383 Other fatigue: Secondary | ICD-10-CM

## 2020-05-03 DIAGNOSIS — R7303 Prediabetes: Secondary | ICD-10-CM | POA: Diagnosis not present

## 2020-05-03 DIAGNOSIS — Z6841 Body Mass Index (BMI) 40.0 and over, adult: Secondary | ICD-10-CM

## 2020-05-03 DIAGNOSIS — E7849 Other hyperlipidemia: Secondary | ICD-10-CM

## 2020-05-03 DIAGNOSIS — E039 Hypothyroidism, unspecified: Secondary | ICD-10-CM

## 2020-05-03 DIAGNOSIS — I10 Essential (primary) hypertension: Secondary | ICD-10-CM | POA: Diagnosis not present

## 2020-05-03 DIAGNOSIS — E559 Vitamin D deficiency, unspecified: Secondary | ICD-10-CM

## 2020-05-03 MED ORDER — OZEMPIC (0.25 OR 0.5 MG/DOSE) 2 MG/1.5ML ~~LOC~~ SOPN: PEN_INJECTOR | SUBCUTANEOUS | 0 refills | Status: DC

## 2020-05-03 NOTE — Progress Notes (Signed)
Dear Dr. Luiz BlareGraves,   Thank you for referring Caitlyn [name] to our clinic. The following note includes my evaluation and treatment recommendations.  Chief Complaint:   OBESITY Caitlyn [name] (MR# [id number]) is a 70 y.o. female who presents for evaluation and treatment of obesity and related comorbidities. Current BMI is Body mass index is 41.27 kg/m. Caitlyn [name] has been struggling with her weight for many years and has been unsuccessful in either losing weight, maintaining weight loss, or reaching her healthy weight goal.  Caitlyn [name] is currently in the action stage of change and ready to dedicate time achieving and maintaining a healthier weight. Caitlyn [name] is interested in becoming our patient and working on intensive lifestyle modifications including (but not limited to) diet and exercise for weight loss.  Caitlyn [name] was sent to us by Dr. Luiz BlareGraves (needs hip replacement.  She lives alone.  She skips breakfast or occasionally has a [employer] certified [employer]ier Protein drink.  Caitlyn [name]'s habits were reviewed today and are as follows: Her family eats meals together, she thinks her family will eat healthier with her, her desired weight loss is 70 pounds, she has been heavy most of her life, she started gaining weight when she began having thyroid problems, her heaviest weight ever was 267 pounds, she is a picky eater and doesn't like to eat healthier foods, she craves sweets or fried foods, she snacks frequently in the evenings, she wakes up sometimes in the middle of the night to eat, she skips breakfast frequently and she struggles with emotional eating.  Depression Screen Caitlyn [name]'s Food and Mood (modified PHQ-9) score was 11.  Depression screen PHQ 2/9 05/03/2020  Decreased Interest 1  Down, Depressed, Hopeless 1  PHQ - 2 Score 2  Altered sleeping 3  Tired, decreased energy 1  Change in appetite 1  Feeling bad or failure about yourself  2  Trouble concentrating 2  Moving slowly or fidgety/restless 0  Suicidal  thoughts 0  PHQ-9 Score 11  Difficult doing work/chores Not difficult at all  Some recent data might be hidden   Assessment/Plan:   1. Other fatigue Caitlyn [name] denies daytime somnolence and admits to waking up still tired. Patent has a history of symptoms of morning fatigue and snoring. Caitlyn [name] generally gets 4 or 5 hours of sleep per night, and states that she has poor quality sleep. Snoring is present. Apneic episodes are not present. Epworth Sleepiness Score is 3.  Caitlyn [name] does feel that her weight is causing her energy to be lower than it should be. Fatigue may be related to obesity, depression or many other causes. Labs will be ordered, and in the meanwhile, Caitlyn [name] will focus on self care including making healthy food choices, increasing physical activity and focusing on stress reduction.  - EKG 12-Lead - Vitamin B12 - CBC with Differential/Platelet - Folate  2. SOBOE (shortness of breath on exertion) Caitlyn [name] notes increasing shortness of breath with exercising and seems to be worsening over time with weight gain. She notes getting out of breath sooner with activity than she used to. This has gotten worse recently. Caitlyn [name] denies shortness of breath at rest or orthopnea.  Caitlyn [name] does feel that she gets out of breath more easily that she used to when she exercises. Caitlyn [name]'s shortness of breath appears to be obesity related and exercise induced. She has agreed to work on weight loss and gradually increase exercise to treat her exercise induced shortness of breath. Will continue to monitor closely.  3. Prediabetes Not at goal.  Goal is HgbA1c < 5.7.  Medication: Ozempic 1 mg subcutaneously weekly.  She was diagnosed ~5 months ago and was put on Ozempic.  She has been taking it for about 3 months or so now.  She is tolerating it well, without side effects.  Plan:  She will continue to focus on protein-rich, low simple carbohydrate foods. We reviewed the importance of hydration, regular  exercise for stress reduction, and restorative sleep.  Will refill Ozempic and check labs today, as per below.  Lab Results  Component Value Date   HGBA1C 6.0 (H) 09/09/2018   Lab Results  Component Value Date   INSULIN 27.7 (H) 01/13/2018   - Comprehensive metabolic panel - Hemoglobin A1c - Insulin, random - Refill OZEMPIC, 0.25 OR 0.5 MG/DOSE, 2 MG/1.5ML SOPN; 1 mg once wkly  Dispense: 3 mL; Refill: 0  4. Essential hypertension At goal. Medications: Coreg 6.25 mg twice daily.  At home, blood pressure is 150s/80s.  Plan: Avoid buying foods that are: processed, frozen, or prepackaged to avoid excess salt. Will check labs today.  Caitlyn [name] will follow-up with her [facility] cardiologist sooner than planned if her blood pressure is not at goal of 130s/80s or less.  We will continue to monitor closely alongside her PCP and/or Specialist.  Regular follow up with PCP and specialists was also encouraged.   BP Readings from Last 3 Encounters:  05/03/20 138/86  01/23/20 128/80  10/27/19 (!) 194/108   Lab Results  Component Value Date   CREATININE 0.83 09/12/2018   5. Other hyperlipidemia Course: Not at goal. Lipid-lowering medications: Crestor 10 mg daily.  She has had 2 AMIs with 2 stent placements.  Plan: Dietary changes: Increase soluble fiber, decrease simple carbohydrates, decrease saturated fat. Exercise changes: Moderate to vigorous-intensity aerobic activity 150 minutes per week or as tolerated. We will continue to monitor along with PCP/specialists as it pertains to her weight loss journey.  Will check labs today, as per below.  Management per Cardiology.  Lab Results  Component Value Date   CHOL 188 06/29/2017   HDL 43 06/29/2017   LDLCALC 104 (H) 06/29/2017   LDLDIRECT 156.4 08/02/2012   TRIG 207 (H) 06/29/2017   CHOLHDL 4.4 06/29/2017   Lab Results  Component Value Date   ALT 50 (H) 09/12/2018   AST 29 09/12/2018   ALKPHOS 48 09/12/2018   BILITOT 0.1 (L) 09/12/2018   -  Lipid panel  6. Vitamin D deficiency Optimal goal > 50 ng/dL.   Plan: Continue to take prescription Vitamin D @50 ,000 IU every week as prescribed.  Will check vitamin D level today.  - VITAMIN D 25 Hydroxy (Vit-D Deficiency, Fractures)  7. Hypothyroidism, unspecified type Course: Stable. Medication: levothyroxine 125 mcg daily.   Plan: Patient was instructed not to take MVM or iron within 4 hours of taking thyroid medications.  We will continue to monitor alongside Endocrinology/PCP as it relates to her weight loss journey.  Check labs today, as per below.  Lab Results  Component Value Date   TSH 3.000 01/13/2018   - T3 - T4, free - TSH  8. Depression screening Caitlyn [name] was screened for depression today as part of her new patient workup.  PHQ-9 is 11.  Caitlyn [name] had a positive depression screening. Depression is commonly associated with obesity and often results in emotional eating behaviors. We will monitor this closely and work on CBT to help improve the non-hunger eating patterns. Referral to Psychology may be required if no improvement is seen as  she continues in our clinic.  9. Class 3 severe obesity with serious comorbidity and body mass index (BMI) of 40.0 to 44.9 in adult, unspecified obesity type [facility])  Caitlyn [name] is currently in the action stage of change and her goal is to continue with weight loss efforts. I recommend Caitlyn [name] begin the structured treatment plan as follows:  She has agreed to the Category 2 Plan.  Exercise goals: As is.   Behavioral modification strategies: increasing lean protein intake, decreasing simple carbohydrates, no skipping meals, meal planning and cooking strategies and keeping healthy foods in the home.  She was informed of the importance of frequent follow-up visits to maximize her success with intensive lifestyle modifications for her multiple health conditions. She was informed we would discuss her lab results at her next visit unless there is  a critical issue that needs to be addressed sooner. Caitlyn [name] agreed to keep her next visit at the agreed upon time to discuss these results.  Objective:   Blood pressure 138/86, pulse 61, temperature (!) 97.4 F (36.3 C), height 5\' 5"  (1.651 m), weight 248 lb (112.5 kg), SpO2 98 %. Body mass index is 41.27 kg/m.  EKG: Normal sinus rhythm, rate 61 bpm.  Indirect Calorimeter completed today shows a VO2 of 246 and a REE of 1716.  Her calculated basal metabolic rate is thus her basal metabolic rate is better than expected.  General: Cooperative, alert, well developed, in no acute distress. HEENT: Conjunctivae and lids unremarkable. Cardiovascular: Regular rhythm.  Lungs: Normal work of breathing. Neurologic: No focal deficits.   Lab Results  Component Value Date   CREATININE 0.83 09/12/2018   BUN 26 (H) 09/12/2018   NA 138 09/12/2018   K 3.5 09/12/2018   CL 100 09/12/2018   CO2 27 09/12/2018   Lab Results  Component Value Date   ALT 50 (H) 09/12/2018   AST 29 09/12/2018   ALKPHOS 48 09/12/2018   BILITOT 0.1 (L) 09/12/2018   Lab Results  Component Value Date   HGBA1C 6.0 (H) 09/09/2018   HGBA1C 5.7 (H) 01/13/2018   Lab Results  Component Value Date   INSULIN 27.7 (H) 01/13/2018   Lab Results  Component Value Date   TSH 3.000 01/13/2018   Lab Results  Component Value Date   CHOL 188 06/29/2017   HDL 43 06/29/2017   LDLCALC 104 (H) 06/29/2017   LDLDIRECT 156.4 08/02/2012   TRIG 207 (H) 06/29/2017   CHOLHDL 4.4 06/29/2017   Lab Results  Component Value Date   WBC 8.3 09/12/2018   HGB 12.5 09/12/2018   HCT 39.1 09/12/2018   MCV 92.7 09/12/2018   PLT 353 09/12/2018   Lab Results  Component Value Date   FERRITIN 351 (H) 09/12/2018   Obesity Behavioral Intervention:   Approximately 15 minutes were spent on the discussion below.  ASK: We discussed the diagnosis of obesity with 09/14/2018 today and Caitlyn [name] agreed to give Caitlyn So permission to discuss obesity  behavioral modification therapy today.  ASSESS: Caitlyn [name] has the diagnosis of obesity and her BMI today is 41.4. Caitlyn [name] is in the action stage of change.   ADVISE: Caitlyn [name] was educated on the multiple health risks of obesity as well as the benefit of weight loss to improve her health. She was advised of the need for long term treatment and the importance of lifestyle modifications to improve her current health and to decrease her risk of future health problems.  AGREE: Multiple dietary modification options and treatment options were  discussed and Caitlyn [name] agreed to follow the recommendations documented in the above note.  ARRANGE: Caitlyn [name] was educated on the importance of frequent visits to treat obesity as outlined per CMS and USPSTF guidelines and agreed to schedule her next follow up appointment today.  Attestation Statements:   This is the patient's first visit at [facility]. The patient's NEW PATIENT PACKET was reviewed at length. Included in the packet: current and past health history, medications, allergies, ROS, gynecologic history (women only), surgical history, family history, social history, weight history, weight loss surgery history (for those that have had weight loss surgery), nutritional evaluation, mood and food questionnaire, PHQ9, Epworth questionnaire, sleep habits questionnaire, patient life and health improvement goals questionnaire. These will all be scanned into the patient's chart under media.   During the visit, I independently reviewed the patient's EKG, bioimpedance scale results, and indirect calorimeter results. I used this information to tailor a meal plan for the patient that will help her to lose weight and will improve her obesity-related conditions going forward. I performed a medically necessary appropriate examination and/or evaluation. I discussed the assessment and treatment plan with the patient. The patient was provided an opportunity to ask  questions and all were answered. The patient agreed with the plan and demonstrated an understanding of the instructions. Labs were ordered at this visit and will be reviewed at the next visit unless more critical results need to be addressed immediately. Clinical information was updated and documented in the EMR.   I, Insurance claims handler, CMA, am acting as Energy manager for [employer], DO.  I have reviewed the above documentation for accuracy and completeness, and I agree with the above. Carlye Grippe, D.O.  The 21st Century Cures Act was signed into law in 2016 which includes the topic of electronic health records.  This provides immediate access to information in MyChart.  This includes consultation notes, operative notes, office notes, lab results and pathology reports.  If you have any questions about what you read please let us know at your next visit so we can discuss your concerns and take corrective action if need be.  We are right here with you.

## 2020-05-04 LAB — COMPREHENSIVE METABOLIC PANEL
ALT: 11 IU/L (ref 0–32)
AST: 19 IU/L (ref 0–40)
Albumin/Globulin Ratio: 1.4 (ref 1.2–2.2)
Albumin: 4.4 g/dL (ref 3.8–4.8)
Alkaline Phosphatase: 141 IU/L — ABNORMAL HIGH (ref 44–121)
BUN/Creatinine Ratio: 24 (ref 12–28)
BUN: 21 mg/dL (ref 8–27)
Bilirubin Total: 0.2 mg/dL (ref 0.0–1.2)
CO2: 18 mmol/L — ABNORMAL LOW (ref 20–29)
Calcium: 10 mg/dL (ref 8.7–10.3)
Chloride: 104 mmol/L (ref 96–106)
Creatinine, Ser: 0.88 mg/dL (ref 0.57–1.00)
GFR calc Af Amer: 79 mL/min/{1.73_m2} (ref >59)
GFR calc non Af Amer: 68 mL/min/{1.73_m2} (ref >59)
Globulin, Total: 3.1 g/dL (ref 1.5–4.5)
Glucose: 85 mg/dL (ref 65–99)
Potassium: 4.4 mmol/L (ref 3.5–5.2)
Sodium: 142 mmol/L (ref 134–144)
Total Protein: 7.5 g/dL (ref 6.0–8.5)

## 2020-05-04 LAB — LIPID PANEL
Chol/HDL Ratio: 3 ratio (ref 0.0–4.4)
Cholesterol, Total: 173 mg/dL (ref 100–199)
HDL: 58 mg/dL (ref >39)
LDL Chol Calc (NIH): 98 mg/dL (ref 0–99)
Triglycerides: 96 mg/dL (ref 0–149)
VLDL Cholesterol Cal: 17 mg/dL (ref 5–40)

## 2020-05-04 LAB — CBC WITH DIFFERENTIAL/PLATELET
Basophils Absolute: 0 10*3/uL (ref 0.0–0.2)
Basos: 1 %
EOS (ABSOLUTE): 0.2 10*3/uL (ref 0.0–0.4)
Eos: 3 %
Hematocrit: 44.6 % (ref 34.0–46.6)
Hemoglobin: 14.4 g/dL (ref 11.1–15.9)
Immature Grans (Abs): 0 10*3/uL (ref 0.0–0.1)
Immature Granulocytes: 0 %
Lymphocytes Absolute: 1.6 10*3/uL (ref 0.7–3.1)
Lymphs: 30 %
MCH: 30.1 pg (ref 26.6–33.0)
MCHC: 32.3 g/dL (ref 31.5–35.7)
MCV: 93 fL (ref 79–97)
Monocytes Absolute: 0.4 10*3/uL (ref 0.1–0.9)
Monocytes: 7 %
Neutrophils Absolute: 3.3 10*3/uL (ref 1.4–7.0)
Neutrophils: 59 %
Platelets: 232 10*3/uL (ref 150–450)
RBC: 4.79 x10E6/uL (ref 3.77–5.28)
RDW: 13.4 % (ref 11.7–15.4)
WBC: 5.6 10*3/uL (ref 3.4–10.8)

## 2020-05-04 LAB — VITAMIN D 25 HYDROXY (VIT D DEFICIENCY, FRACTURES): Vit D, 25-Hydroxy: 40.5 ng/mL (ref 30.0–100.0)

## 2020-05-04 LAB — FOLATE: Folate: 15.3 ng/mL (ref >3.0)

## 2020-05-04 LAB — TSH: TSH: 0.716 u[IU]/mL (ref 0.450–4.500)

## 2020-05-04 LAB — T4, FREE: Free T4: 1.91 ng/dL — ABNORMAL HIGH (ref 0.82–1.77)

## 2020-05-04 LAB — T3: T3, Total: 110 ng/dL (ref 71–180)

## 2020-05-04 LAB — INSULIN, RANDOM: INSULIN: 14.6 u[IU]/mL (ref 2.6–24.9)

## 2020-05-04 LAB — VITAMIN B12: Vitamin B-12: 510 pg/mL (ref 232–1245)

## 2020-05-04 LAB — HEMOGLOBIN A1C
Est. average glucose Bld gHb Est-mCnc: 108 mg/dL
Hgb A1c MFr Bld: 5.4 % (ref 4.8–5.6)

## 2020-05-10 MED ORDER — OZEMPIC (1 MG/DOSE) 2 MG/1.5ML ~~LOC~~ SOPN: 1.0000 mg | PEN_INJECTOR | SUBCUTANEOUS | 0 refills | Status: DC

## 2020-05-15 ENCOUNTER — Other Ambulatory Visit: Payer: Medicare Other

## 2020-05-15 DIAGNOSIS — Z20822 Contact with and (suspected) exposure to covid-19: Secondary | ICD-10-CM

## 2020-05-16 ENCOUNTER — Encounter (INDEPENDENT_AMBULATORY_CARE_PROVIDER_SITE_OTHER): Payer: Self-pay

## 2020-05-16 LAB — SARS-COV-2, NAA 2 DAY TAT

## 2020-05-16 LAB — NOVEL CORONAVIRUS, NAA: SARS-CoV-2, NAA: NOT DETECTED

## 2020-05-17 ENCOUNTER — Other Ambulatory Visit: Payer: Self-pay

## 2020-05-17 ENCOUNTER — Encounter (INDEPENDENT_AMBULATORY_CARE_PROVIDER_SITE_OTHER): Payer: Self-pay | Admitting: Family Medicine

## 2020-05-17 ENCOUNTER — Ambulatory Visit (INDEPENDENT_AMBULATORY_CARE_PROVIDER_SITE_OTHER): Payer: Medicare Other | Admitting: Family Medicine

## 2020-05-17 VITALS — BP 138/80 | HR 67 | Temp 97.8°F | Ht 65.0 in | Wt 250.0 lb

## 2020-05-17 DIAGNOSIS — E559 Vitamin D deficiency, unspecified: Secondary | ICD-10-CM

## 2020-05-17 DIAGNOSIS — Z6841 Body Mass Index (BMI) 40.0 and over, adult: Secondary | ICD-10-CM

## 2020-05-17 DIAGNOSIS — E7849 Other hyperlipidemia: Secondary | ICD-10-CM | POA: Diagnosis not present

## 2020-05-17 DIAGNOSIS — R7303 Prediabetes: Secondary | ICD-10-CM

## 2020-05-17 DIAGNOSIS — I10 Essential (primary) hypertension: Secondary | ICD-10-CM

## 2020-05-17 MED ORDER — VITAMIN D (ERGOCALCIFEROL) 1.25 MG (50000 UNIT) PO CAPS
50000.0000 [IU] | ORAL_CAPSULE | ORAL | 0 refills | Status: DC
Start: 2020-05-17 — End: 2020-06-06

## 2020-05-17 MED ORDER — ROSUVASTATIN CALCIUM 10 MG PO TABS: ORAL_TABLET | ORAL | Status: DC

## 2020-05-17 MED ORDER — VITAMIN D 50 MCG (2000 UT) PO TABS: 2000.0000 [IU] | ORAL_TABLET | Freq: Every day | ORAL | Status: AC

## 2020-05-21 DIAGNOSIS — E7849 Other hyperlipidemia: Secondary | ICD-10-CM | POA: Insufficient documentation

## 2020-05-21 DIAGNOSIS — I10 Essential (primary) hypertension: Secondary | ICD-10-CM | POA: Insufficient documentation

## 2020-05-22 NOTE — Progress Notes (Signed)
Chief Complaint:   OBESITY Caitlyn [name] is here to discuss her progress with her obesity treatment plan along with follow-up of her obesity related diagnoses.   Today's visit was #: 2 Starting weight: 248 lbs Starting date: 05/03/2020 Today's weight: 250 lbs Today's date: 05/17/2020 Total lbs lost to date: +2 lbs Body mass index is 41.6 kg/m.   Interim History:   Caitlyn [name] is here today to review her NEW Meal Plan and to discuss all recent labs done here and/ or done at outside facilities. This is patient's first follow up visit. Extended time was spent counseling Caitlyn [name] on all new disease processes that were discovered or that are worsening.   For breakfast, she has 1 boiled egg or a yogurt.  She has 2 ounces of protein at lunch.  After speaking with her, she says that she actually followed the plan 30-40% of the time.  Plan:  Increase water intake.  We reviewed the meal plan again.  Current Meal Plan: the Category 2 Plan for 30-40% of the time.  Current Exercise Plan: Walking for 10-15 minutes 3 times per week. Current Anti-Obesity Medications: Ozempic 1 mg subcutaneously weekly. Side effects: None.  Assessment/Plan:   1. Prediabetes At goal. Goal is HgbA1c < 5.7.  Medication: Ozempic 1 mg subcutaneously weekly.  Elevated FI.   Plan:  Discussed labs with patient today.  She will continue to focus on protein-rich, low simple carbohydrate foods. We reviewed the importance of hydration, regular exercise for stress reduction, and restorative sleep.  Continue Ozempic.  Lab Results  Component Value Date   HGBA1C 5.4 05/03/2020   Lab Results  Component Value Date   INSULIN 14.6 05/03/2020   INSULIN 27.7 (H) 01/13/2018   2. Other hyperlipidemia Course: Controlled. Lipid-lowering medications: Crestor 10 mg daily.  Rarely takes her Crestor.  No side effects, but she just does not want to take it daily.  Plan:  Discussed labs with patient today.  Dietary changes:  Increase soluble fiber, decrease simple carbohydrates, decrease saturated fat. Exercise changes: Moderate to vigorous-intensity aerobic activity 150 minutes per week or as tolerated. We will continue to monitor along with PCP/specialists as it pertains to her weight loss journey.  After discussion, change to taking Crestor every other day.  She feels she will be more compliant with this regimen.  Script given for Crestor 10 mg every other day.  Lab Results  Component Value Date   CHOL 173 05/03/2020   HDL 58 05/03/2020   LDLCALC 98 05/03/2020   LDLDIRECT 156.4 08/02/2012   TRIG 96 05/03/2020   CHOLHDL 3.0 05/03/2020   Lab Results  Component Value Date   ALT 11 05/03/2020   AST 19 05/03/2020   ALKPHOS 141 (H) 05/03/2020   BILITOT 0.2 05/03/2020   - Start rosuvastatin (CRESTOR) 10 MG tablet; One tab po every other night b-4 bed  3. Essential hypertension At goal. Medications: Coreg 6.25 mg twice daily, Bumex 1 mg daily.    Plan:  Discussed labs with patient today.  Avoid buying foods that are: processed, frozen, or prepackaged to avoid excess salt.  Medication management per PCP or Cardiology.  Blood pressure at goal today.  Continue prudent nutritional plan and weight loss.  Increase exercise.  BP Readings from Last 3 Encounters:  05/17/20 138/80  05/03/20 138/86  01/23/20 128/80   Lab Results  Component Value Date   CREATININE 0.88 05/03/2020   4. Vitamin D deficiency Not at goal. Current  vitamin D is 40.5, tested on 05/03/2020. Optimal goal > 50 ng/dL.  She is taking vitamin D 50,000 IU daily.  Plan:  Discussed labs with patient today.  Continue to take prescription Vitamin D @50 ,000 IU every week as prescribed.  Add OTC vitamin D 2,000 IU daily to regimen.  Follow-up for routine testing of Vitamin D, at least 2-3 times per year to avoid over-replacement.  - Refill Vitamin D, Ergocalciferol, (DRISDOL) 1.25 MG (50000 UNIT) CAPS capsule; Take 1 capsule (50,000 Units total) by  mouth every 7 (seven) days.  Dispense: 4 capsule; Refill: 0 - Start Cholecalciferol (VITAMIN D) 50 MCG (2000 UT) tablet; Take 1 tablet (2,000 Units total) by mouth daily.  5. Class 3 severe obesity with serious comorbidity and body mass index (BMI) of 40.0 to 44.9 in adult, unspecified obesity type [facility])  Course: Caitlyn [name] is currently in the action stage of change. As such, her goal is to continue with weight loss efforts.   Nutrition goals: She has agreed to the Category 2 Plan with protein equivalents.   Exercise goals: As is.  Behavioral modification strategies: increasing lean protein intake, decreasing simple carbohydrates, increasing water intake, no skipping meals, meal planning and cooking strategies, keeping healthy foods in the home, better snacking choices and planning for success.  Caitlyn [name] has agreed to follow-up with our clinic in 2-2.5 weeks. She was informed of the importance of frequent follow-up visits to maximize her success with intensive lifestyle modifications for her multiple health conditions.   Objective:   Blood pressure 138/80, pulse 67, temperature 97.8 F (36.6 C), height 5\' 5"  (1.651 m), weight 250 lb (113.4 kg), SpO2 97 %. Body mass index is 41.6 kg/m.  General: Cooperative, alert, well developed, in no acute distress. HEENT: Conjunctivae and lids unremarkable. Cardiovascular: Regular rhythm.  Lungs: Normal work of breathing. Neurologic: No focal deficits.   Lab Results  Component Value Date   CREATININE 0.88 05/03/2020   BUN 21 05/03/2020   NA 142 05/03/2020   K 4.4 05/03/2020   CL 104 05/03/2020   CO2 18 (L) 05/03/2020   Lab Results  Component Value Date   ALT 11 05/03/2020   AST 19 05/03/2020   ALKPHOS 141 (H) 05/03/2020   BILITOT 0.2 05/03/2020   Lab Results  Component Value Date   HGBA1C 5.4 05/03/2020   HGBA1C 6.0 (H) 09/09/2018   HGBA1C 5.7 (H) 01/13/2018   Lab Results  Component Value Date   INSULIN 14.6 05/03/2020   INSULIN  27.7 (H) 01/13/2018   Lab Results  Component Value Date   TSH 0.716 05/03/2020   Lab Results  Component Value Date   CHOL 173 05/03/2020   HDL 58 05/03/2020   LDLCALC 98 05/03/2020   LDLDIRECT 156.4 08/02/2012   TRIG 96 05/03/2020   CHOLHDL 3.0 05/03/2020   Lab Results  Component Value Date   WBC 5.6 05/03/2020   HGB 14.4 05/03/2020   HCT 44.6 05/03/2020   MCV 93 05/03/2020   PLT 232 05/03/2020   Lab Results  Component Value Date   FERRITIN 351 (H) 09/12/2018   Obesity Behavioral Intervention:   Approximately 15 minutes were spent on the discussion below.  ASK: We discussed the diagnosis of obesity with 05/05/2020 today and Roben agreed to give [name] permission to discuss obesity behavioral modification therapy today.  ASSESS: Ferrin has the diagnosis of obesity and her BMI today is 41.6. Marcha is in the action stage of change.   ADVISE: Aylana was educated on  the multiple health risks of obesity as well as the benefit of weight loss to improve her health. She was advised of the need for long term treatment and the importance of lifestyle modifications to improve her current health and to decrease her risk of future health problems.  AGREE: Multiple dietary modification options and treatment options were discussed and Lamekia agreed to follow the recommendations documented in the above note.  ARRANGE: Journie was educated on the importance of frequent visits to treat obesity as outlined per CMS and USPSTF guidelines and agreed to schedule her next follow up appointment today.  Attestation Statements:   Reviewed by clinician on day of visit: allergies, medications, problem list, medical history, surgical history, family history, social history, and previous encounter notes.  Time spent on visit including pre-visit chart review and post-visit care and charting was >60 minutes.   I, Insurance claims handler, CMA, am acting as Energy manager for [employer], DO.  I have  reviewed the above documentation for accuracy and completeness, and I agree with the above. Carlye Grippe, D.O.  The 21st Century Cures Act was signed into law in 2016 which includes the topic of electronic health records.  This provides immediate access to information in MyChart.  This includes consultation notes, operative notes, office notes, lab results and pathology reports.  If you have any questions about what you read please let us know at your next visit so we can discuss your concerns and take corrective action if need be.  We are right here with you.

## 2020-06-03 ENCOUNTER — Other Ambulatory Visit (INDEPENDENT_AMBULATORY_CARE_PROVIDER_SITE_OTHER): Payer: Self-pay | Admitting: Family Medicine

## 2020-06-03 DIAGNOSIS — R7303 Prediabetes: Secondary | ICD-10-CM

## 2020-06-06 ENCOUNTER — Ambulatory Visit (INDEPENDENT_AMBULATORY_CARE_PROVIDER_SITE_OTHER): Payer: Medicare Other | Admitting: Family Medicine

## 2020-06-06 ENCOUNTER — Other Ambulatory Visit: Payer: Self-pay

## 2020-06-06 ENCOUNTER — Encounter (INDEPENDENT_AMBULATORY_CARE_PROVIDER_SITE_OTHER): Payer: Self-pay | Admitting: Family Medicine

## 2020-06-06 VITALS — BP 144/90 | HR 68 | Temp 98.0°F | Ht 65.0 in | Wt 246.0 lb

## 2020-06-06 DIAGNOSIS — R7303 Prediabetes: Secondary | ICD-10-CM | POA: Diagnosis not present

## 2020-06-06 DIAGNOSIS — Z6841 Body Mass Index (BMI) 40.0 and over, adult: Secondary | ICD-10-CM

## 2020-06-06 DIAGNOSIS — E559 Vitamin D deficiency, unspecified: Secondary | ICD-10-CM

## 2020-06-06 MED ORDER — OZEMPIC (1 MG/DOSE) 2 MG/1.5ML ~~LOC~~ SOPN: 1.0000 mg | PEN_INJECTOR | SUBCUTANEOUS | 0 refills | Status: DC

## 2020-06-06 MED ORDER — VITAMIN D (ERGOCALCIFEROL) 1.25 MG (50000 UNIT) PO CAPS: 50000.0000 [IU] | ORAL_CAPSULE | ORAL | 0 refills | Status: DC

## 2020-06-12 NOTE — Progress Notes (Signed)
Chief Complaint:   OBESITY Caitlyn [name] is here to discuss her progress with her obesity treatment plan along with follow-up of her obesity related diagnoses.   Today's visit was #: 3 Starting weight: 248 lbs Starting date: 05/03/2020 Today's weight: 246 lbs Today's date: 06/06/2020 Total lbs lost to date: 2 lbs Body mass index is 40.94 kg/m.  Total weight loss percentage to date: -0.81%  Interim History:  Caitlyn [name] says she is bored with lunch and wants to have a smoothie for breakfast everyday as well.  Current Meal Plan: the Category 2 Plan with protein equivalents. Current Exercise Plan: Walking and going to the gym for 85% of the time. Current Anti-Obesity Medications: Ozempic 1 mg subcutaneously weekly. Side effects: None.  Assessment/Plan:   No orders of the defined types were placed in this encounter.   Medications Discontinued During This Encounter  Medication Reason  . Semaglutide, 1 MG/DOSE, (OZEMPIC, 1 MG/DOSE,) 2 MG/1.5ML SOPN Reorder  . Vitamin D, Ergocalciferol, (DRISDOL) 1.25 MG (50000 UNIT) CAPS capsule Reorder     Meds ordered this encounter  Medications  . Semaglutide, 1 MG/DOSE, (OZEMPIC, 1 MG/DOSE,) 2 MG/1.5ML SOPN    Sig: Inject 1 mg into the skin once a week.    Dispense:  3 mL    Refill:  0  . Vitamin D, Ergocalciferol, (DRISDOL) 1.25 MG (50000 UNIT) CAPS capsule    Sig: Take 1 capsule (50,000 Units total) by mouth every 7 (seven) days.    Dispense:  4 capsule    Refill:  0     1. Prediabetes At goal. Goal is HgbA1c < 5.7.  Medication: Ozempic 1 mg subcutaneously weekly.    Plan:  Continue Ozempic.  She will continue to focus on protein-rich, low simple carbohydrate foods. We reviewed the importance of hydration, regular exercise for stress reduction, and restorative sleep.   Lab Results  Component Value Date   HGBA1C 5.4 05/03/2020   Lab Results  Component Value Date   INSULIN 14.6 05/03/2020   INSULIN 27.7 (H) 01/13/2018   - Refill  Semaglutide, 1 MG/DOSE, (OZEMPIC, 1 MG/DOSE,) 2 MG/1.5ML SOPN; Inject 1 mg into the skin once a week.  Dispense: 3 mL; Refill: 0  2. Vitamin D deficiency Improving, but not optimized. Current vitamin D is 40.5, tested on 05/03/2020. Optimal goal > 50 ng/dL.  She is taking vitamin D 50,000 IU weekly.  Plan: Continue to take prescription Vitamin D @50 ,000 IU every week as prescribed.  Follow-up for routine testing of Vitamin D, at least 2-3 times per year to avoid over-replacement.  - Refill Vitamin D, Ergocalciferol, (DRISDOL) 1.25 MG (50000 UNIT) CAPS capsule; Take 1 capsule (50,000 Units total) by mouth every 7 (seven) days.  Dispense: 4 capsule; Refill: 0  3. Class 3 severe obesity with serious comorbidity and body mass index (BMI) of 40.0 to 44.9 in adult, unspecified obesity type Caitlyn [name])  Course: Caitlyn [name] is currently in the action stage of change. As such, her goal is to continue with weight loss efforts.   Nutrition goals: She has agreed to the Category 2 Plan and keeping a food journal and adhering to recommended goals of 250-300 calories and 25+ grams of protein at breakfast plus lunch options..   Exercise goals: As is.  Behavioral modification strategies: increasing lean protein intake, decreasing simple carbohydrates and planning for success.  Caitlyn [name] has agreed to follow-up with our clinic in 2-3 weeks. She was informed of the importance of frequent follow-up visits to maximize her  success with intensive lifestyle modifications for her multiple health conditions.   Objective:   Blood pressure (!) 144/90, pulse 68, temperature 98 F (36.7 C), height 5\' 5"  (1.651 m), weight 246 lb (111.6 kg), SpO2 98 %. Body mass index is 40.94 kg/m.  General: Cooperative, alert, well developed, in no acute distress. HEENT: Conjunctivae and lids unremarkable. Cardiovascular: Regular rhythm.  Lungs: Normal work of breathing. Neurologic: No focal deficits.   Lab Results  Component Value Date    CREATININE 0.88 05/03/2020   BUN 21 05/03/2020   NA 142 05/03/2020   K 4.4 05/03/2020   CL 104 05/03/2020   CO2 18 (L) 05/03/2020   Lab Results  Component Value Date   ALT 11 05/03/2020   AST 19 05/03/2020   ALKPHOS 141 (H) 05/03/2020   BILITOT 0.2 05/03/2020   Lab Results  Component Value Date   HGBA1C 5.4 05/03/2020   HGBA1C 6.0 (H) 09/09/2018   HGBA1C 5.7 (H) 01/13/2018   Lab Results  Component Value Date   INSULIN 14.6 05/03/2020   INSULIN 27.7 (H) 01/13/2018   Lab Results  Component Value Date   TSH 0.716 05/03/2020   Lab Results  Component Value Date   CHOL 173 05/03/2020   HDL 58 05/03/2020   LDLCALC 98 05/03/2020   LDLDIRECT 156.4 08/02/2012   TRIG 96 05/03/2020   CHOLHDL 3.0 05/03/2020   Lab Results  Component Value Date   WBC 5.6 05/03/2020   HGB 14.4 05/03/2020   HCT 44.6 05/03/2020   MCV 93 05/03/2020   PLT 232 05/03/2020   Lab Results  Component Value Date   FERRITIN 351 (H) 09/12/2018   Obesity Behavioral Intervention:   Approximately 15 minutes were spent on the discussion below.  ASK: We discussed the diagnosis of obesity with 09/14/2018 today and Caitlyn [name] agreed to give Caitlyn [name] permission to discuss obesity behavioral modification therapy today.  ASSESS: Caitlyn [name] has the diagnosis of obesity and her BMI today is 41.0. Caitlyn [name] is in the action stage of change.   ADVISE: Caitlyn [name] was educated on the multiple health risks of obesity as well as the benefit of weight loss to improve her health. She was advised of the need for long term treatment and the importance of lifestyle modifications to improve her current health and to decrease her risk of future health problems.  AGREE: Multiple dietary modification options and treatment options were discussed and Caitlyn [name] agreed to follow the recommendations documented in the above note.  ARRANGE: Caitlyn [name] was educated on the importance of frequent visits to treat obesity as outlined per CMS and USPSTF  guidelines and agreed to schedule her next follow up appointment today.  Attestation Statements:   Reviewed by clinician on day of visit: allergies, medications, problem list, medical history, surgical history, family history, social history, and previous encounter notes.  I, Caitlyn [name], CMA, am acting as Insurance claims handler for Energy manager, DO.  I have reviewed the above documentation for accuracy and completeness, and I agree with the above. [employer], D.O.  The 21st Century Cures Act was signed into law in 2016 which includes the topic of electronic health records.  This provides immediate access to information in MyChart.  This includes consultation notes, operative notes, office notes, lab results and pathology reports.  If you have any questions about what you read please let 2017 know at your next visit [name] we can discuss your concerns and take corrective action if need be.  We are right here  with you.

## 2020-06-14 ENCOUNTER — Ambulatory Visit (INDEPENDENT_AMBULATORY_CARE_PROVIDER_SITE_OTHER): Payer: Medicare Other | Admitting: Family Medicine

## 2020-06-18 ENCOUNTER — Telehealth: Payer: Self-pay

## 2020-06-18 NOTE — Telephone Encounter (Signed)
Pt called with c/o bilateral rest pain, R>L pain/ankle redness. She feels her knees are swollen. All of this has been ongoing since her last office visit in 8/21 but she feels it is worsening over the last 2-3 months. She denies any sores/wounds on her LE. She has been scheduled for lab/MD visit this week. Pt is aware and verbalized understanding of these appts.

## 2020-06-19 ENCOUNTER — Other Ambulatory Visit: Payer: Self-pay

## 2020-06-19 DIAGNOSIS — I739 Peripheral vascular disease, unspecified: Secondary | ICD-10-CM

## 2020-06-20 ENCOUNTER — Ambulatory Visit (INDEPENDENT_AMBULATORY_CARE_PROVIDER_SITE_OTHER): Payer: Medicare Other | Admitting: Vascular Surgery

## 2020-06-20 ENCOUNTER — Ambulatory Visit (HOSPITAL_COMMUNITY)
Admission: RE | Admit: 2020-06-20 | Discharge: 2020-06-20 | Disposition: A | Discharge status: Home / Self Care | Payer: Medicare Other | Source: Ambulatory Visit | Attending: Vascular Surgery | Admitting: Vascular Surgery

## 2020-06-20 ENCOUNTER — Other Ambulatory Visit: Payer: Self-pay

## 2020-06-20 ENCOUNTER — Encounter: Payer: Self-pay | Admitting: Vascular Surgery

## 2020-06-20 VITALS — BP 151/84 | HR 69 | Temp 97.3°F | Resp 16 | Ht 66.0 in | Wt 244.0 lb

## 2020-06-20 DIAGNOSIS — I739 Peripheral vascular disease, unspecified: Secondary | ICD-10-CM | POA: Diagnosis not present

## 2020-06-20 DIAGNOSIS — I825Y1 Chronic embolism and thrombosis of unspecified deep veins of right proximal lower extremity: Secondary | ICD-10-CM

## 2020-06-20 DIAGNOSIS — M25561 Pain in right knee: Secondary | ICD-10-CM

## 2020-06-20 DIAGNOSIS — M25562 Pain in left knee: Secondary | ICD-10-CM | POA: Diagnosis not present

## 2020-06-20 NOTE — Progress Notes (Signed)
Patient is a 70 year old female who was last seen August 2021.  She complains of a few week history of pain in her left hip and left groin.  This radiates down her left leg.  She also complains of pain in her right knee.  She does not really describe claudication in either leg.  She does not have rest pain in her feet.  She does not have any nonhealing wounds.  She is a former smoker but quit many years ago.  She did notice some redness in the right pretibial area recently.  She states that she had similar symptoms when she had a DVT in the past.  She does not really have any lower extremity edema.  She intermittently wears compression stockings.  She has a known history of mild peripheral arterial disease.  An arteriogram in 2017 which showed a 75% mid right SFA stenosis.  She also has a history of a right popliteal DVT and was treated with Eliquis in the past.  He has a history of chronic back pain coronary artery disease and moderate carotid stenosis as well as hyperlipidemia and hypertension.  She currently has an orthopedic appointment scheduled tomorrow to further evaluate her hip and knee pain.  She currently is switching over primary care physicians.  She had been followed at [facility] in the past and is switching back to [facility].  Her previous primary care physician was Dr. Vira Browns.  She is on Plavix and a statin.  Past Medical History:  Diagnosis Date  . Anxiety   . Arthritis    "legs" (01/07/2016)  . Back pain   . Back pain   . CAD 06/2009   a.  s/p MI 4/11: tx with DES x 2 to RCA;  b.  LHC 03/17/11: LAD 30-40%, distal LAD 30-40%, OM2 40-50%, RCA stent patent, EF greater than 70%.;  c.  Lexiscan Myoview (12/15):  Mild apical thinning, no ischemia, EF 61%; normal study  . CAROTID STENOSIS    carotid Dopplers 03/25/11: RICA 60-79%; LICA 0-39%.  Follow up recommended in 6 months.  . Constipation   . Constipation   . COPD (chronic obstructive pulmonary disease) (HCC)   . Depression   . Gout     "on RX for it" (01/07/2016)  . Heart disease   . History of heart attack   . Hx of blood clots   . HYPERLIPIDEMIA   . HYPERTENSION   . HYPOTHYROIDISM    post ablation tx Graves  . Hypothyroidism   . Joint pain   . Myocardial infarction [facility]) 2012   "mini"  . OSA on CPAP   . Osteoarthritis   . Pre-diabetes   . PVD   . Rheumatoid arthritis (HCC)   . Sleep apnea   . SOBOE (shortness of breath on exertion)   . TOBACCO ABUSE quit 06/2009  . Varicose veins   . VITAMIN D DEFICIENCY    DEXA 04/2009 normal    Past Surgical History:  Procedure Laterality Date  . CARDIAC CATHETERIZATION N/A 01/07/2016   Procedure: Left Heart Cath and Coronary Angiography;  Surgeon: Rinaldo Cloud, MD;  Location[facility] INVASIVE CV LAB;  Service: Cardiovascular;  Laterality: N/A;  . CARDIAC CATHETERIZATION N/A 01/07/2016   Procedure: Coronary Stent Intervention;  Surgeon: Rinaldo Cloud, MD;  Location: [facility] INVASIVE CV LAB;  Service: Cardiovascular;  Laterality: N/A;  . CAROTID STENT Left    bilateral carotid artery disease post stent of left carotid  . CESAREAN SECTION  1985   "  twins"  . CORONARY ANGIOPLASTY    . LEFT HEART CATHETERIZATION WITH CORONARY ANGIOGRAM N/A 03/17/2011   Procedure: LEFT HEART CATHETERIZATION WITH CORONARY ANGIOGRAM;  Surgeon: Herby Abraham, MD;  Location: [facility] CATH LAB;  Service: Cardiovascular;  Laterality: N/A;  . PERIPHERAL VASCULAR CATHETERIZATION N/A 04/06/2015   Procedure: Abdominal Aortogram;  Surgeon: Sherren Kerns, MD;  Location: [facility] INVASIVE CV LAB;  Service: Cardiovascular;  Laterality: N/A;  . RADIOACTIVE PLAQUE INSERTION     Graves disease post radioactive treatment complicated hypothyroidism     Current Outpatient Medications on File Prior to Visit  Medication Sig Dispense Refill  . allopurinol (ZYLOPRIM) 300 MG tablet Take 300 mg by mouth daily.    . bumetanide (BUMEX) 1 MG tablet Take 1 mg by mouth daily.    . carvedilol (COREG) 6.25 MG tablet Take 6.25 mg by  mouth 2 (two) times daily with a meal.    . Cholecalciferol (VITAMIN D) 50 MCG (2000 UT) tablet Take 1 tablet (2,000 Units total) by mouth daily.    . clonazePAM (KLONOPIN) 1 MG tablet Take 1 tablet (1 mg total) by mouth at bedtime. 30 tablet 5  . clopidogrel (PLAVIX) 75 MG tablet Take 75 mg by mouth daily.   2  . colchicine 0.6 MG tablet Take 0.6 mg by mouth daily as needed (for flare ups). Flare ups    . levothyroxine (SYNTHROID, LEVOTHROID) 125 MCG tablet Take 125 mcg by mouth daily before breakfast.    . LINZESS 145 MCG CAPS capsule Take 145 mcg by mouth daily.    . potassium chloride (K-DUR) 10 MEQ tablet Take 10 mEq by mouth daily as needed. Taking 1 tablet when taking Lasix    . rosuvastatin (CRESTOR) 10 MG tablet One tab po every other night b-4 bed    . Semaglutide, 1 MG/DOSE, (OZEMPIC, 1 MG/DOSE,) 2 MG/1.5ML SOPN Inject 1 mg into the skin once a week. 3 mL 0  . Vitamin D, Ergocalciferol, (DRISDOL) 1.25 MG (50000 UNIT) CAPS capsule Take 1 capsule (50,000 Units total) by mouth every 7 (seven) days. 4 capsule 0  . zinc gluconate 50 MG tablet Take 50 mg by mouth daily.    Marland Kitchen HYDROcodone-acetaminophen (NORCO/VICODIN) 5-325 MG tablet Take by mouth. (Patient not taking: Reported on 06/20/2020)    . nitroGLYCERIN (NITROSTAT) 0.4 MG SL tablet  (Patient not taking: Reported on 06/20/2020)     No current facility-administered medications on file prior to visit.    Review of systems: She has no shortness of breath.  She has no chest pain.  Physical exam:  Vitals:   06/20/20 0925  BP: (!) 151/84  Pulse: 69  Resp: 16  Temp: (!) 97.3 F (36.3 C)  TempSrc: Temporal  SpO2: 100%  Weight: 244 lb (110.7 kg)  Height: 5\' 6"  (1.676 m)    Extremities: No palpable pedal pulses bilaterally, no pretibial or pedal edema  Skin: No open ulcer or wound, few scattered varicosities bilaterally, no significant erythema mild chronic skin changes right pretibial region  Data: Patient had bilateral ABIs  performed today which were 0.7 on the right 0.98 on the left toe pressure was greater than 100 bilaterally.  This is very similar to her previous ABIs with mild decline since 2019 but essentially similar toe pressure.  Assessment: Multifactorial leg pain most of her symptoms seem more orthopedic in nature as she complains primarily of left groin and hip pain and right knee pain.  The symptoms are not consistent with usually a  venous or arterial etiology.  She does not describe claudication or rest pain in either foot.  She does not have a nonhealing wound.  She does not have any swelling which would be consistent with DVT.  She is very anxious about recurrent DVT.  Plan: Patient was reassured today that she is not at risk of limb loss with her current amount of perfusion.  I also discussed with her that I do not believe her symptoms are related to arterial occlusive disease.  She was very anxious that she could have a recurrent DVT.  She does not really have any swelling in her right leg and other than some mild skin changes I have low suspicion for this.  However, since she has had a DVT in the past we will repeat a duplex exam of her right leg.  We will schedule this for her in the near future at her convenience.  She will be seen in the APP clinic after her duplex ultrasound of the venous system.  Fabienne Bruns, MD [facility] [place] Office: [phone]

## 2020-06-21 ENCOUNTER — Ambulatory Visit: Payer: Medicare Other | Admitting: Internal Medicine

## 2020-06-24 ENCOUNTER — Encounter (INDEPENDENT_AMBULATORY_CARE_PROVIDER_SITE_OTHER): Payer: Self-pay

## 2020-06-25 ENCOUNTER — Ambulatory Visit (INDEPENDENT_AMBULATORY_CARE_PROVIDER_SITE_OTHER): Payer: Medicare Other | Admitting: Family Medicine

## 2020-06-27 ENCOUNTER — Encounter (INDEPENDENT_AMBULATORY_CARE_PROVIDER_SITE_OTHER): Payer: Self-pay | Admitting: Bariatrics

## 2020-06-27 ENCOUNTER — Ambulatory Visit (INDEPENDENT_AMBULATORY_CARE_PROVIDER_SITE_OTHER): Payer: Medicare Other | Admitting: Bariatrics

## 2020-06-27 ENCOUNTER — Other Ambulatory Visit: Payer: Self-pay

## 2020-06-27 VITALS — BP 122/83 | HR 72 | Temp 97.9°F | Ht 65.0 in | Wt 244.0 lb

## 2020-06-27 DIAGNOSIS — E039 Hypothyroidism, unspecified: Secondary | ICD-10-CM | POA: Diagnosis not present

## 2020-06-27 DIAGNOSIS — E876 Hypokalemia: Secondary | ICD-10-CM

## 2020-06-27 DIAGNOSIS — Z6841 Body Mass Index (BMI) 40.0 and over, adult: Secondary | ICD-10-CM | POA: Diagnosis not present

## 2020-06-27 DIAGNOSIS — I10 Essential (primary) hypertension: Secondary | ICD-10-CM | POA: Diagnosis not present

## 2020-06-27 MED ORDER — CARVEDILOL 6.25 MG PO TABS: 6.2500 mg | ORAL_TABLET | Freq: Two times a day (BID) | ORAL | 0 refills | Status: DC

## 2020-06-27 MED ORDER — POTASSIUM CHLORIDE ER 10 MEQ PO TBCR: 10.0000 meq | EXTENDED_RELEASE_TABLET | Freq: Every day | ORAL | 0 refills | Status: DC | PRN

## 2020-06-28 ENCOUNTER — Encounter (INDEPENDENT_AMBULATORY_CARE_PROVIDER_SITE_OTHER): Payer: Self-pay | Admitting: Bariatrics

## 2020-06-28 NOTE — Progress Notes (Signed)
Chief Complaint:   OBESITY Sela is here to discuss her progress with her obesity treatment plan along with follow-up of her obesity related diagnoses. Ahnesty is on the Category 2 Plan and states she is following her eating plan approximately 85% of the time. Robbye states she is walking for 30 minutes 3 times per week.  Today's visit was #: 4 Starting weight: 248 lbs Starting date: 05/03/2020 Today's weight: 244 lbs Today's date: 06/27/2020 Total lbs lost to date: 4 lbs Total lbs lost since last in-office visit: 2 lbs  Interim History: Azile is down 2 pounds since her last visit.  She has been in pain in her left hip and has seen the orthopedist.  She is doing well with her water.  Subjective:   1. Essential hypertension Controlled.  Review: taking medications as instructed, no medication side effects noted, no chest pain on exertion, no dyspnea on exertion, no swelling of ankles.    BP Readings from Last 3 Encounters:  06/27/20 122/83  06/20/20 (!) 151/84  06/06/20 (!) 144/90   2. Hypothyroidism, unspecified type Taking Synthroid.  Lab Results  Component Value Date   TSH 0.716 05/03/2020   3. Hypokalemia Taking potassium as directed.  Lab Results  Component Value Date   K 4.4 05/03/2020   Assessment/Plan:   1. Essential hypertension Sanye is working on healthy weight loss and exercise to improve blood pressure control. We will watch for signs of hypotension as she continues her lifestyle modifications.  Continue medications.  Will refill Coreg 6.25 mg twice daily as per below.  Will also refill potassium today.  - Refill carvedilol (COREG) 6.25 MG tablet; Take 1 tablet (6.25 mg total) by mouth 2 (two) times daily with a meal.  Dispense: 60 tablet; Refill: 0  2. Hypothyroidism, unspecified type Patient with long-standing hypothyroidism, on levothyroxine therapy. She appears euthyroid. Orders and follow up as documented in patient record.  Continue  Synthroid.  Counseling . Good thyroid control is important for overall health. Supratherapeutic thyroid levels are dangerous and will not improve weight loss results. . The correct way to take levothyroxine is fasting, with water, separated by at least 30 minutes from breakfast, and separated by more than 4 hours from calcium, iron, multivitamins, acid reflux medications (PPIs).   3. Hypokalemia Continue potassium.  Will refill potassium 10 mEq daily as needed.  Will take 1 tablet when taking Lasix.  - Refill potassium chloride (KLOR-CON) 10 MEQ tablet; Take 1 tablet (10 mEq total) by mouth daily as needed. Taking 1 tablet when taking Lasix  Dispense: 30 tablet; Refill: 0  4. Obesity, current BMI 39  Carrissa is currently in the action stage of change. As such, her goal is to continue with weight loss efforts. She has agreed to the Category 2 Plan.   She will work on meal planning, intentional eating, and will drink a shake for breakfast.   Exercise goals: As is.  Behavioral modification strategies: increasing lean protein intake, decreasing simple carbohydrates, increasing vegetables, increasing water intake, decreasing eating out, no skipping meals, meal planning and cooking strategies, keeping healthy foods in the home, ways to avoid boredom eating and planning for success.  Alizea has agreed to follow-up with our clinic in 2 weeks with Dr. Sharee Holster. She was informed of the importance of frequent follow-up visits to maximize her success with intensive lifestyle modifications for her multiple health conditions.   Objective:   Blood pressure 122/83, pulse 72, temperature 97.9 F (36.6 C),  height 5\' 5"  (1.651 m), SpO2 97 %. Body mass index is 40.6 kg/m.  General: Cooperative, alert, well developed, in no acute distress. HEENT: Conjunctivae and lids unremarkable. Cardiovascular: Regular rhythm.  Lungs: Normal work of breathing. Neurologic: No focal deficits.   Lab Results   Component Value Date   CREATININE 0.88 05/03/2020   BUN 21 05/03/2020   NA 142 05/03/2020   K 4.4 05/03/2020   CL 104 05/03/2020   CO2 18 (L) 05/03/2020   Lab Results  Component Value Date   ALT 11 05/03/2020   AST 19 05/03/2020   ALKPHOS 141 (H) 05/03/2020   BILITOT 0.2 05/03/2020   Lab Results  Component Value Date   HGBA1C 5.4 05/03/2020   HGBA1C 6.0 (H) 09/09/2018   HGBA1C 5.7 (H) 01/13/2018   Lab Results  Component Value Date   INSULIN 14.6 05/03/2020   INSULIN 27.7 (H) 01/13/2018   Lab Results  Component Value Date   TSH 0.716 05/03/2020   Lab Results  Component Value Date   CHOL 173 05/03/2020   HDL 58 05/03/2020   LDLCALC 98 05/03/2020   LDLDIRECT 156.4 08/02/2012   TRIG 96 05/03/2020   CHOLHDL 3.0 05/03/2020   Lab Results  Component Value Date   WBC 5.6 05/03/2020   HGB 14.4 05/03/2020   HCT 44.6 05/03/2020   MCV 93 05/03/2020   PLT 232 05/03/2020   Lab Results  Component Value Date   FERRITIN 351 (H) 09/12/2018   Obesity Behavioral Intervention:   Approximately 15 minutes were spent on the discussion below.  ASK: We discussed the diagnosis of obesity with 09/14/2018 today and Niquita agreed to give [name] permission to discuss obesity behavioral modification therapy today.  ASSESS: Caralynn has the diagnosis of obesity and her BMI today is 40.7. Ladana is in the action stage of change.   ADVISE: Victoriya was educated on the multiple health risks of obesity as well as the benefit of weight loss to improve her health. She was advised of the need for long term treatment and the importance of lifestyle modifications to improve her current health and to decrease her risk of future health problems.  AGREE: Multiple dietary modification options and treatment options were discussed and Charday agreed to follow the recommendations documented in the above note.  ARRANGE: Jaclynne was educated on the importance of frequent visits to treat obesity as  outlined per CMS and USPSTF guidelines and agreed to schedule her next follow up appointment today.  Attestation Statements:   Reviewed by clinician on day of visit: allergies, medications, problem list, medical history, surgical history, family history, social history, and previous encounter notes.  I, [name], CMA, am acting as Insurance claims handler for Energy manager, DO  I have reviewed the above documentation for accuracy and completeness, and I agree with the above. [employer], DO

## 2020-07-01 ENCOUNTER — Other Ambulatory Visit (INDEPENDENT_AMBULATORY_CARE_PROVIDER_SITE_OTHER): Payer: Self-pay | Admitting: Family Medicine

## 2020-07-01 DIAGNOSIS — R7303 Prediabetes: Secondary | ICD-10-CM

## 2020-07-02 NOTE — Telephone Encounter (Signed)
Last seen by Dr. Brown. 

## 2020-07-02 NOTE — Telephone Encounter (Signed)
Pt last seen by Dr. Opalski.  

## 2020-07-04 ENCOUNTER — Ambulatory Visit: Payer: Medicare Other

## 2020-07-04 ENCOUNTER — Other Ambulatory Visit (INDEPENDENT_AMBULATORY_CARE_PROVIDER_SITE_OTHER): Payer: Self-pay | Admitting: Family Medicine

## 2020-07-04 ENCOUNTER — Inpatient Hospital Stay (HOSPITAL_COMMUNITY): Admission: RE | Admit: 2020-07-04 | Payer: Medicare Other | Source: Ambulatory Visit

## 2020-07-04 DIAGNOSIS — R7303 Prediabetes: Secondary | ICD-10-CM

## 2020-07-04 NOTE — Telephone Encounter (Signed)
Last seen Dr. Brown

## 2020-07-05 ENCOUNTER — Other Ambulatory Visit (INDEPENDENT_AMBULATORY_CARE_PROVIDER_SITE_OTHER): Payer: Self-pay | Admitting: Family Medicine

## 2020-07-05 DIAGNOSIS — R7303 Prediabetes: Secondary | ICD-10-CM

## 2020-07-09 NOTE — Telephone Encounter (Signed)
Last seen Dr. Brown

## 2020-07-11 ENCOUNTER — Other Ambulatory Visit: Payer: Self-pay

## 2020-07-11 ENCOUNTER — Encounter (INDEPENDENT_AMBULATORY_CARE_PROVIDER_SITE_OTHER): Payer: Self-pay | Admitting: Family Medicine

## 2020-07-11 ENCOUNTER — Ambulatory Visit (INDEPENDENT_AMBULATORY_CARE_PROVIDER_SITE_OTHER): Payer: Medicare Other | Admitting: Family Medicine

## 2020-07-11 VITALS — BP 140/82 | HR 57 | Temp 98.2°F | Ht 65.0 in | Wt 245.0 lb

## 2020-07-11 DIAGNOSIS — R7303 Prediabetes: Secondary | ICD-10-CM | POA: Diagnosis not present

## 2020-07-11 DIAGNOSIS — K5909 Other constipation: Secondary | ICD-10-CM | POA: Diagnosis not present

## 2020-07-11 DIAGNOSIS — E559 Vitamin D deficiency, unspecified: Secondary | ICD-10-CM | POA: Diagnosis not present

## 2020-07-11 DIAGNOSIS — Z6841 Body Mass Index (BMI) 40.0 and over, adult: Secondary | ICD-10-CM

## 2020-07-11 DIAGNOSIS — E66813 Obesity, class 3: Secondary | ICD-10-CM

## 2020-07-11 IMAGING — DX PORTABLE CHEST - 1 VIEW
1 series · 1 of 1 positions shown · non-contrast
Comparison: Portable chest 01/06/2016.  CT chest 11/07/2016.

CLINICAL DATA: Fever, chills and chest pain today. Sore throat for
2-3 days.

EXAM:
PORTABLE CHEST 1 VIEW

[chest ap]
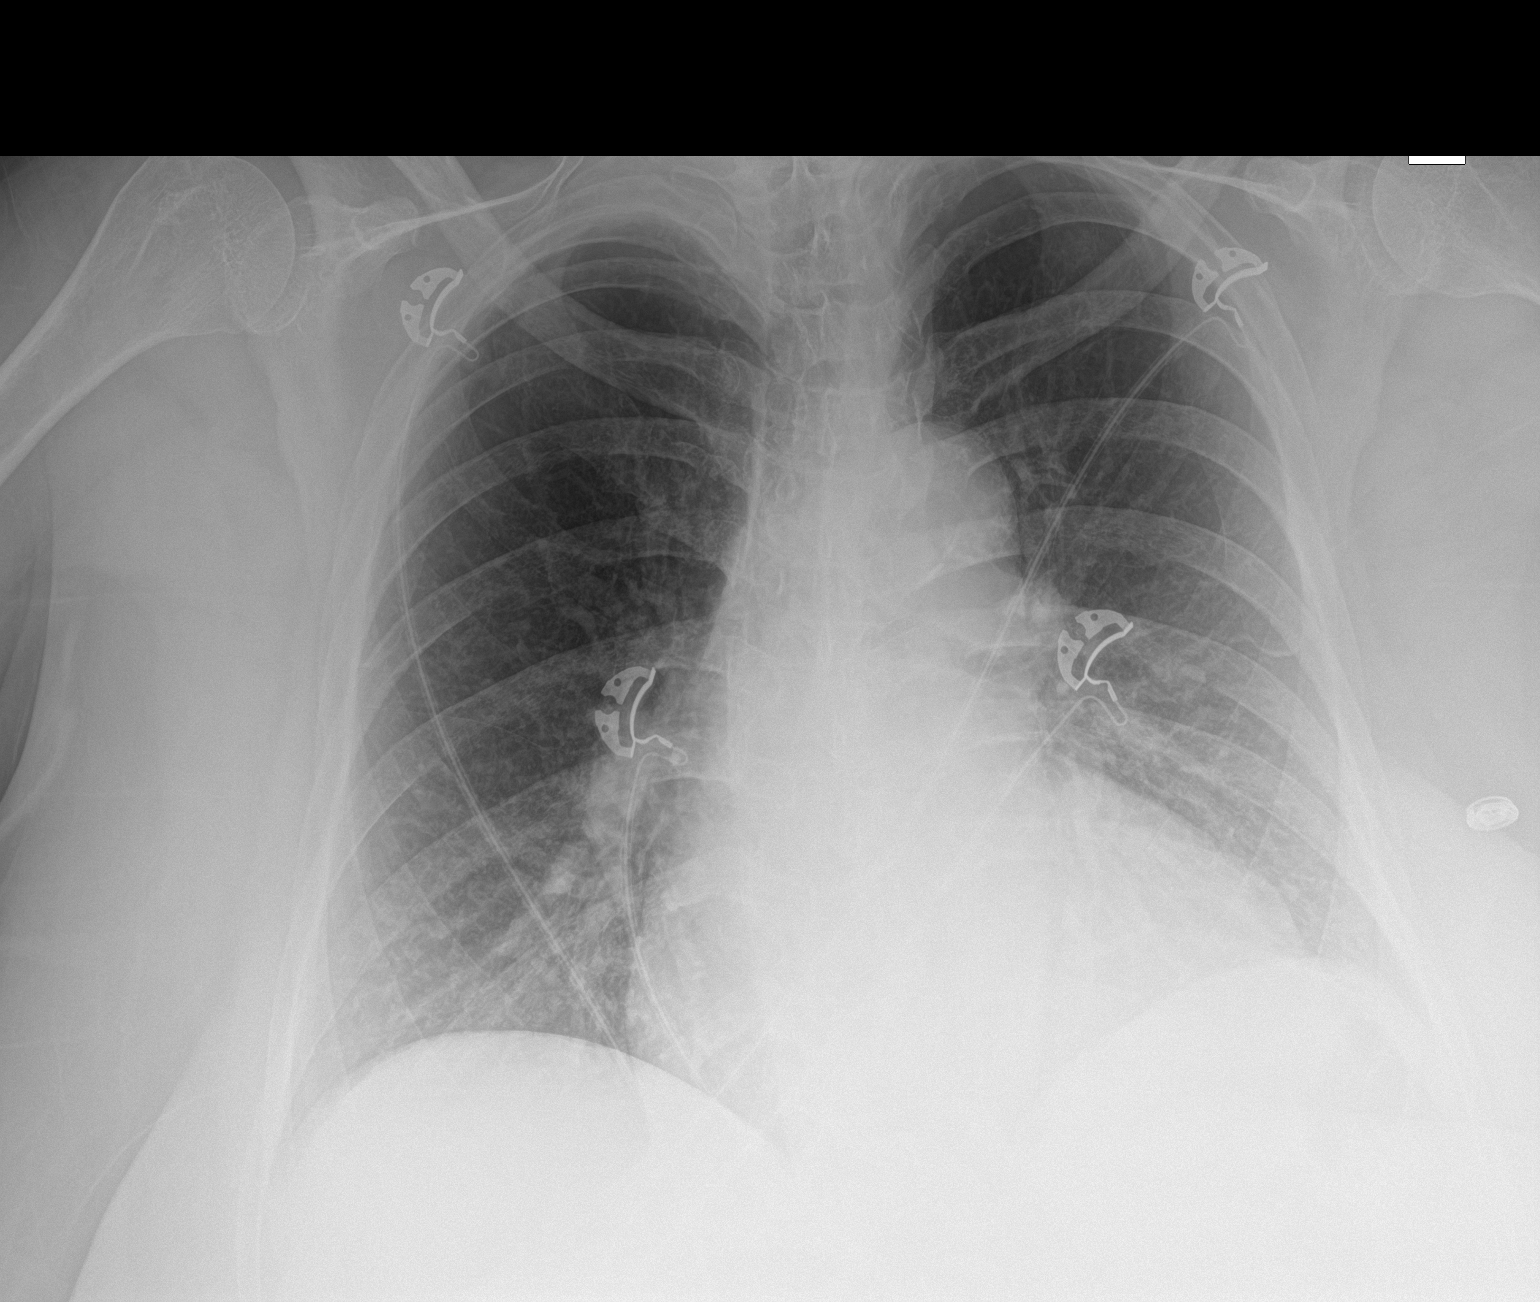

[1 of 1 positions shown; findings below may reference images not displayed]

FINDINGS: 5648 hours. Stable cardiomegaly and mild aortic atherosclerosis.
Probable mild left basilar atelectasis, exaggerated by overlying
soft tissues. No edema, confluent airspace opacity, pleural effusion
or pneumothorax identified. The bones appear unremarkable. Telemetry
leads overlie the chest.
IMPRESSION: No suspected acute findings. Cardiomegaly with mild left basilar
atelectasis.

## 2020-07-11 MED ORDER — OZEMPIC (1 MG/DOSE) 2 MG/1.5ML ~~LOC~~ SOPN: 1.0000 mg | PEN_INJECTOR | SUBCUTANEOUS | 0 refills | Status: DC

## 2020-07-11 MED ORDER — LINZESS 145 MCG PO CAPS: 145.0000 ug | ORAL_CAPSULE | Freq: Every day | ORAL | 0 refills | Status: DC

## 2020-07-12 ENCOUNTER — Encounter: Payer: Self-pay | Admitting: Family Medicine

## 2020-07-12 ENCOUNTER — Ambulatory Visit (INDEPENDENT_AMBULATORY_CARE_PROVIDER_SITE_OTHER): Payer: Medicare Other | Admitting: Family Medicine

## 2020-07-12 ENCOUNTER — Other Ambulatory Visit: Payer: Self-pay

## 2020-07-12 VITALS — BP 150/80 | HR 61 | Temp 98.1°F | Ht 65.0 in | Wt 248.4 lb

## 2020-07-12 DIAGNOSIS — K5909 Other constipation: Secondary | ICD-10-CM | POA: Insufficient documentation

## 2020-07-12 DIAGNOSIS — W19XXXA Unspecified fall, initial encounter: Secondary | ICD-10-CM

## 2020-07-12 DIAGNOSIS — I10 Essential (primary) hypertension: Secondary | ICD-10-CM

## 2020-07-12 DIAGNOSIS — S20211A Contusion of right front wall of thorax, initial encounter: Secondary | ICD-10-CM

## 2020-07-12 DIAGNOSIS — R0789 Other chest pain: Secondary | ICD-10-CM

## 2020-07-12 MED ORDER — LOSARTAN POTASSIUM 50 MG PO TABS: 50.0000 mg | ORAL_TABLET | Freq: Every day | ORAL | 3 refills | Status: DC

## 2020-07-12 NOTE — Patient Instructions (Addendum)
For your blood pressure: Increase losartan to 50mg  daily. You can take 2 25mg  tabs together to make 50mg  total until you run out. New Rx sent to your pharmacy Check BP at home 3-4x/wk  Low sodium diet Regular walking/exercise Follow-up in 2 wks   Likely a rib contusion (bruise) Lungs sound good Recommend heating pad 2-3x/day for 15-20 min Continue tylenol Take deep breaths  Follow up if symptoms worsen or do not improve in 2-3 wks

## 2020-07-12 NOTE — Progress Notes (Signed)
Caitlyn [name] is a 70 y.o. female  Chief Complaint  Patient presents with  . Establish Care    Pt would like to discuss Bp, pt fell on rt side, Tuesday and she is sore which she would also like to discuss.    HPI: Caitlyn [name] is a 70 y.o. female patient seen today to establish care with our office and to discuss concern about her blood pressure. Previous PCP was with [facility].   Pt states she tripped over a client's leg (client in wheelchair) and fell 2 days ago. She is sore on her Rt side after this. Hurts to take a deep breath. No SOB. She has been taking tylenol.   Pt has a h/o HTN and is on losartan 25mg  daily and coreg 6.25mg  BID. She states home BPs have been running in 150's/80's.  Diet: she is working on weight loss and is [facility] Exercise: walks 30 min 3x/wk She has chronic pain d/t hip.  She follows with pulm, cardio, vascular surgery  Past Medical History:  Diagnosis Date  . Anxiety   . Arthritis    "legs" (01/07/2016)  . Back pain   . Back pain   . CAD 06/2009   a.  s/p MI 4/11: tx with DES x 2 to RCA;  b.  LHC 03/17/11: LAD 30-40%, distal LAD 30-40%, OM2 40-50%, RCA stent patent, EF greater than 70%.;  c.  Lexiscan Myoview (12/15):  Mild apical thinning, no ischemia, EF 61%; normal study  . CAROTID STENOSIS    carotid Dopplers 03/25/11: RICA 60-79%; LICA 0-39%.  Follow up recommended in 6 months.  . Constipation   . Constipation   . COPD (chronic obstructive pulmonary disease) (HCC)   . Depression   . Gout    "on RX for it" (01/07/2016)  . Heart disease   . History of heart attack   . Hx of blood clots   . HYPERLIPIDEMIA   . HYPERTENSION   . HYPOTHYROIDISM    post ablation tx Graves  . Hypothyroidism   . Joint pain   . Myocardial infarction [facility]) 2012   "mini"  . OSA on CPAP   . Osteoarthritis   . Pre-diabetes   . PVD   . Rheumatoid arthritis (HCC)   . Sleep apnea   . SOBOE (shortness of breath on  exertion)   . TOBACCO ABUSE quit 06/2009  . Varicose veins   . VITAMIN D DEFICIENCY    DEXA 04/2009 normal    Past Surgical History:  Procedure Laterality Date  . CARDIAC CATHETERIZATION N/A 01/07/2016   Procedure: Left Heart Cath and Coronary Angiography;  Surgeon: 01/09/2016, MD;  Location: [facility] INVASIVE CV LAB;  Service: Cardiovascular;  Laterality: N/A;  . CARDIAC CATHETERIZATION N/A 01/07/2016   Procedure: Coronary Stent Intervention;  Surgeon: 01/09/2016, MD;  Location: [facility] INVASIVE CV LAB;  Service: Cardiovascular;  Laterality: N/A;  . CAROTID STENT Left    bilateral carotid artery disease post stent of left carotid  . CESAREAN SECTION  1985   "twins"  . CORONARY ANGIOPLASTY    . LEFT HEART CATHETERIZATION WITH CORONARY ANGIOGRAM N/A 03/17/2011   Procedure: LEFT HEART CATHETERIZATION WITH CORONARY ANGIOGRAM;  Surgeon: 03/19/2011, MD;  Location: [facility] CATH LAB;  Service: Cardiovascular;  Laterality: N/A;  . PERIPHERAL VASCULAR CATHETERIZATION N/A 04/06/2015   Procedure: Abdominal Aortogram;  Surgeon: 04/08/2015, MD;  Location: [facility] INVASIVE CV LAB;  Service: Cardiovascular;  Laterality: N/A;  . RADIOACTIVE PLAQUE INSERTION     Graves disease post radioactive treatment complicated hypothyroidism    Social History   Socioeconomic History  . Marital status: Divorced    Spouse name: Not on file  . Number of children: Not on file  . Years of education: Not on file  . Highest education level: Not on file  Occupational History  . Occupation: retired, Comptroller for mental [employer]  Tobacco Use  . Smoking status: Former Smoker    Packs/day: 1.00    Years: 35.00    Pack years: 35.00    Types: Cigarettes    Quit date: 06/30/2009    Years since quitting: 11.0  . Smokeless tobacco: Never Used  Vaping Use  . Vaping Use: Never used  Substance and Sexual Activity  . Alcohol use: Yes    Alcohol/week: 2.0 standard drinks    Types: 2 Glasses of wine per week    Comment: occ  .  Drug use: No  . Sexual activity: Not Currently    Comment: Quit smoking after MI- prev 1ppd x 35ys  Other Topics Concern  . Not on file  Social History Narrative  . Not on file   Social Determinants of Health   Financial Resource Strain: Not on file  Food Insecurity: Not on file  Transportation Needs: Not on file  Physical Activity: Not on file  Stress: Not on file  Social Connections: Not on file  Intimate Partner Violence: Not on file    Family History  Problem Relation Age of Onset  . Heart attack Mother   . Stroke Mother   . Cancer Mother   . Varicose Veins Mother   . Thyroid disease Mother   . Hypertension Mother   . Heart disease Mother   . Sudden death Mother   . Depression Mother   . Cancer Father   . Heart disease Father   . Hypertension Father   . Coronary artery disease Other   . Hypertension Other   . Hypertension Sister   . Breast cancer Paternal Aunt      Immunization History  Administered Date(s) Administered  . Influenza Split 03/24/2016  . Influenza-Unspecified 01/02/2020  . PFIZER(Purple Top)SARS-COV-2 Vaccination 12/06/2019, 01/23/2020, 01/23/2020  . Pneumococcal-Unspecified 01/24/2016  . Tdap 04/11/2016    Outpatient Encounter Medications as of 07/12/2020  Medication Sig  . allopurinol (ZYLOPRIM) 300 MG tablet Take 300 mg by mouth daily.  . bumetanide (BUMEX) 1 MG tablet Take 1 mg by mouth daily.  . carvedilol (COREG) 6.25 MG tablet Take 1 tablet (6.25 mg total) by mouth 2 (two) times daily with a meal.  . Cholecalciferol (VITAMIN D) 50 MCG (2000 UT) tablet Take 1 tablet (2,000 Units total) by mouth daily.  . clonazePAM (KLONOPIN) 1 MG tablet Take 1 tablet (1 mg total) by mouth at bedtime.  . clopidogrel (PLAVIX) 75 MG tablet Take 75 mg by mouth daily.   . colchicine 0.6 MG tablet Take 0.6 mg by mouth daily as needed (for flare ups). Flare ups  . furosemide (LASIX) 40 MG tablet Take 40 mg by mouth daily.  Marland Kitchen levothyroxine (SYNTHROID,  LEVOTHROID) 125 MCG tablet Take 125 mcg by mouth daily before breakfast.  . LINZESS 145 MCG CAPS capsule Take 1 capsule (145 mcg total) by mouth daily.  Marland Kitchen losartan (COZAAR) 50 MG tablet Take 1 tablet (50 mg total) by mouth daily.  . nitroGLYCERIN (NITROSTAT) 0.4 MG SL tablet   . potassium chloride (KLOR-CON) 10 MEQ  tablet Take 1 tablet (10 mEq total) by mouth daily as needed. Taking 1 tablet when taking Lasix  . rosuvastatin (CRESTOR) 10 MG tablet One tab po every other night b-4 bed  . Semaglutide, 1 MG/DOSE, (OZEMPIC, 1 MG/DOSE,) 2 MG/1.5ML SOPN Inject 1 mg into the skin once a week.  . Vitamin D, Ergocalciferol, (DRISDOL) 1.25 MG (50000 UNIT) CAPS capsule Take 1 capsule (50,000 Units total) by mouth every 7 (seven) days.  Marland Kitchen. zinc gluconate 50 MG tablet Take 50 mg by mouth daily.  . [DISCONTINUED] losartan (COZAAR) 25 MG tablet Take 25 mg by mouth daily.   No facility-administered encounter medications on file as of 07/12/2020.     ROS: Pertinent positives and negatives noted in HPI. Remainder of ROS non-contributory    Allergies  Allergen Reactions  . Amlodipine Swelling  . Statins Other (See Comments)    myalgia    BP (!) 150/80 (BP Location: Left Arm, Patient Position: Sitting, Cuff Size: Large)   Pulse 61   Temp 98.1 F (36.7 C) (Oral)   Ht 5\' 5"  (1.651 m)   Wt 248 lb 6.4 oz (112.7 kg)   SpO2 98%   BMI 41.34 kg/m   Wt Readings from Last 3 Encounters:  07/12/20 248 lb 6.4 oz (112.7 kg)  07/11/20 245 lb (111.1 kg)  06/27/20 244 lb (110.7 kg)   Temp Readings from Last 3 Encounters:  07/12/20 98.1 F (36.7 C) (Oral)  07/11/20 98.2 F (36.8 C)  06/27/20 97.9 F (36.6 C)   BP Readings from Last 3 Encounters:  07/12/20 (!) 150/80  07/11/20 140/82  06/27/20 122/83   Pulse Readings from Last 3 Encounters:  07/12/20 61  07/11/20 (!) 57  06/27/20 72     Physical Exam Constitutional:      General: She is not in acute distress.    Appearance: Normal appearance.  She is not ill-appearing.  Cardiovascular:     Rate and Rhythm: Normal rate and regular rhythm.     Pulses: Normal pulses.  Pulmonary:     Effort: Pulmonary effort is normal. No accessory muscle usage, respiratory distress or retractions.     Breath sounds: Normal breath sounds and air entry. No decreased air movement.  Chest:     Chest wall: Tenderness present. No lacerations, deformity, swelling, crepitus or edema.    Musculoskeletal:     Right lower leg: No edema.     Left lower leg: No edema.  Neurological:     Mental Status: She is alert and oriented to person, place, and time.  Psychiatric:        Mood and Affect: Mood normal.        Behavior: Behavior normal.      A/P:   1. Essential hypertension, benign - elevated in office today and on home readings, as per pt - she is actively working to lose weight, decrease sodium intake, walking 3x/wk for 30min Increase: - losartan (COZAAR) 50 MG tablet; Take 1 tablet (50 mg total) by mouth daily.  Dispense: 90 tablet; Refill: 3 - increased from 25mg  daily - cont coreg 6.25mg  BID - check BP at home 3-4x/wk and f/u in 2 wks  2. Musculoskeletal chest pain 3. Rib contusion, right, initial encounter 4. Cause of injury, accidental fall, initial encounter - spO2 = 98% and good air movement, very unlikely pt has pneumothorax - treat supportively with heat/ice, tylenol, encouraged full/deep breaths - f/u if symptoms worsen or do not improve in 2-3 wks  Discussed  plan and reviewed medications with patient, including risks, benefits, and potential side effects. Pt expressed understand. All questions answered.   This visit occurred during the SARS-CoV-2 public health emergency.  Safety protocols were in place, including screening questions prior to the visit, additional usage of staff PPE, and extensive cleaning of exam room while observing appropriate contact time as indicated for disinfecting solutions.

## 2020-07-19 IMAGING — DX PORTABLE CHEST - 1 VIEW
1 series · 1 of 1 positions shown · non-contrast
Comparison: 08/31/2018

CLINICAL DATA: Syncope. Hypoxia. [id number] positive.

EXAM:
PORTABLE CHEST 1 VIEW

[chest]
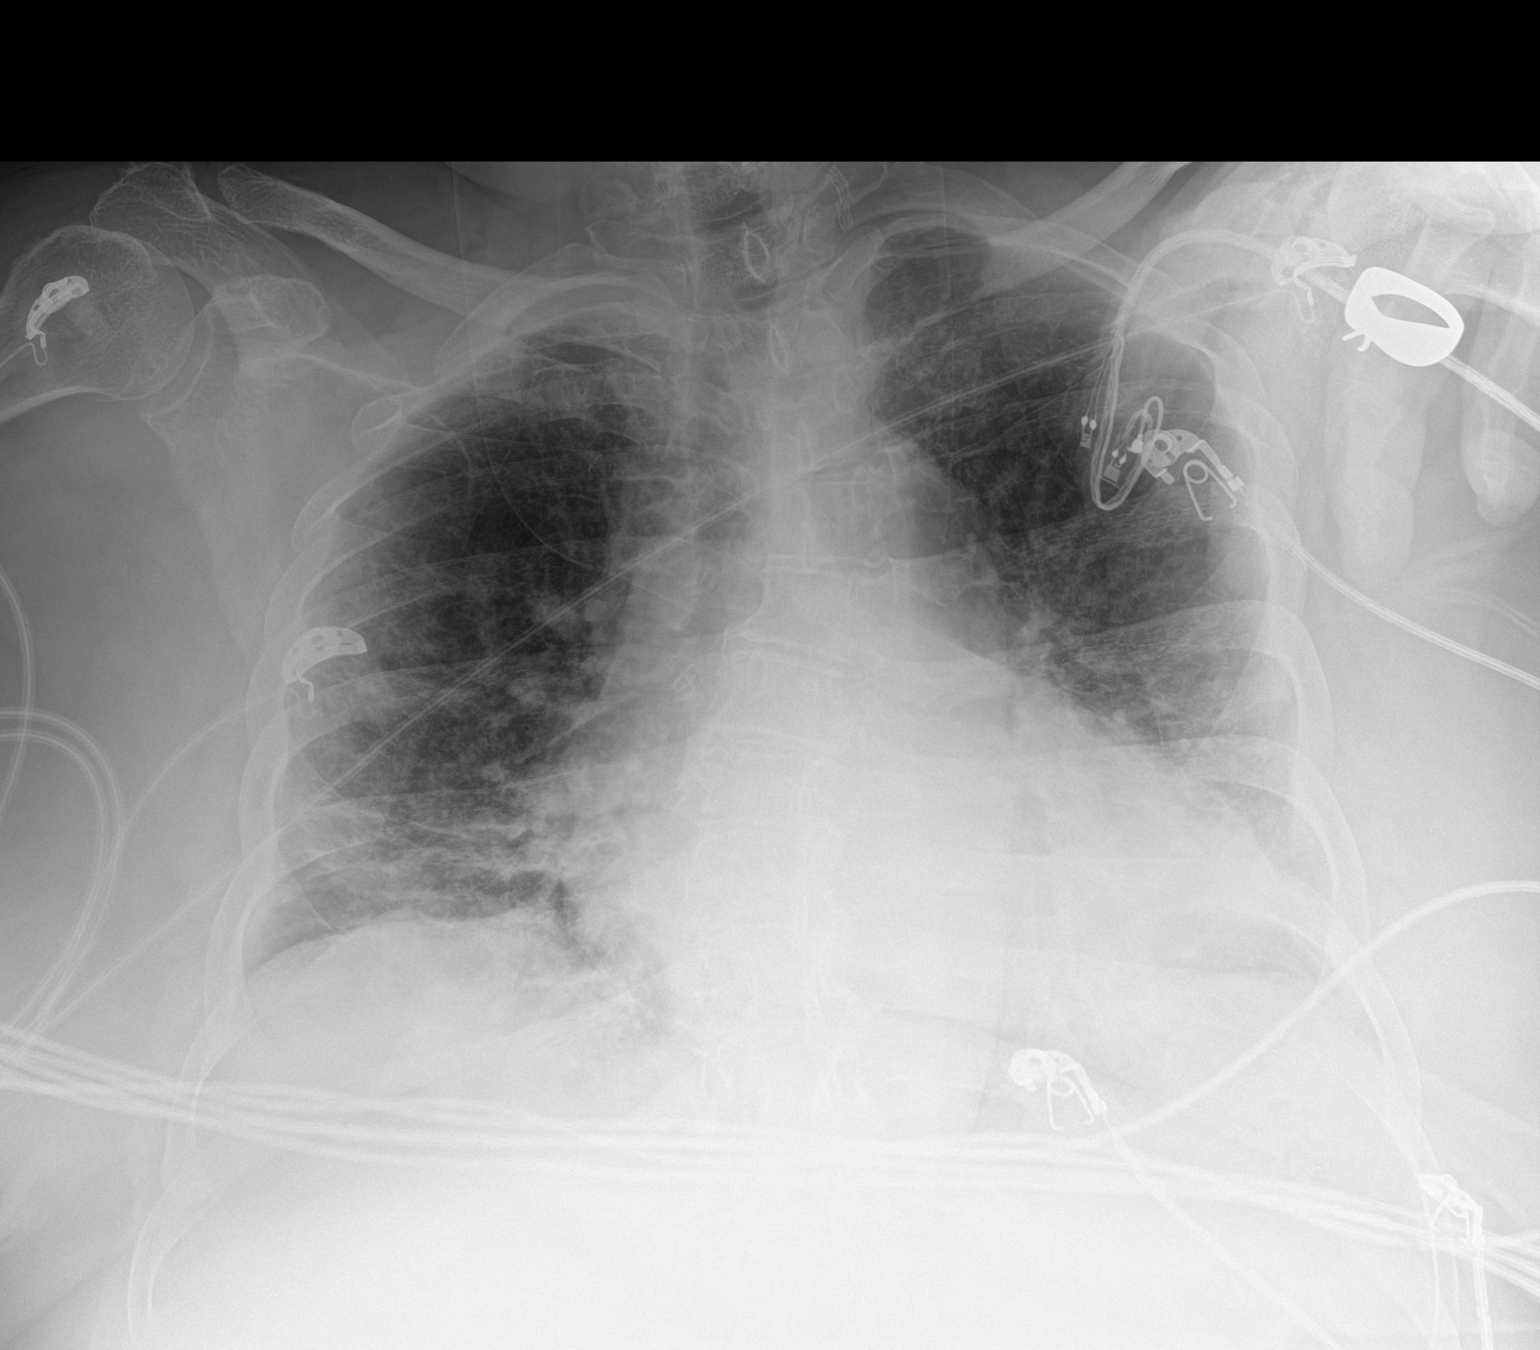

[1 of 1 positions shown; findings below may reference images not displayed]

FINDINGS: The cardiac silhouette remains mildly enlarged. The lungs are mildly
hypoinflated. There is new airspace opacity in the left lung base,
and there is milder patchy and streaky opacity in the right lung
base. The interstitial markings are mildly increased bilaterally
compared to the prior study. No sizable pleural effusion or
pneumothorax is identified. Mild thoracic scoliosis is noted.
IMPRESSION: New left basilar airspace opacity concerning for pneumonia.
Additional right basilar pneumonia versus atelectasis.

## 2020-07-19 NOTE — Progress Notes (Signed)
Chief Complaint:   OBESITY Caitlyn [name] is here to discuss her progress with her obesity treatment plan along with follow-up of her obesity related diagnoses.   Today's visit was #: 5 Starting weight: 248 lbs Starting date: 05/03/2020 Today's weight: 245 lbs Today's date: 07/11/2020 Total lbs lost to date: 3 lbs Body mass index is 40.77 kg/m.  Total weight loss percentage to date: -1.21%  Interim History:  Caitlyn [name] says it was more difficult to stick to plan over Easter.  She also had leftovers for several days as well.  No dissues with plan but food recall is not completely accurate with what is on plan.  Current Meal Plan: the Category 2 Plan for 85% of the time.  Current Exercise Plan: Walking for 30-45 minutes 2-3 times per week. Current Anti-Obesity Medications: Ozempic 1 mg subcutaneously weekly. Side effects: None.  Assessment/Plan:   1. Prediabetes At goal. Goal is HgbA1c < 5.7.  Medication: Ozempic 1 mg subcutaneously weekly.    Plan:  She will continue to focus on protein-rich, low simple carbohydrate foods. We reviewed the importance of hydration, regular exercise for stress reduction, and restorative sleep.   Lab Results  Component Value Date   HGBA1C 5.4 05/03/2020   Lab Results  Component Value Date   INSULIN 14.6 05/03/2020   INSULIN 27.7 (H) 01/13/2018   - Refill Semaglutide, 1 MG/DOSE, (OZEMPIC, 1 MG/DOSE,) 2 MG/1.5ML SOPN; Inject 1 mg into the skin once a week.  Dispense: 3 mL; Refill: 0  2. Chronic constipation This problem is controlled.  Symptoms have been present "all her life".  Symptoms have not changed since starting the meal plan.  She drinks 80-85 ounces of water per day.    Plan:  Refill Linzess 145 mcg daily, as per below.  Caitlyn [name] was informed that a decrease in bowel movement frequency is normal while losing weight, but stools should not be hard or painful.  Counseling: Getting to Good Bowel Health: Your goal is to have one soft bowel  movement each day. Drink at least 8 glasses of water each day. Eat plenty of fiber (goal is over 30 grams each day). It is best to get most of your fiber from dietary sources which includes leafy green vegetables, fresh fruit, and whole grains. You may need to add fiber with the help of OTC fiber supplements. These include Metamucil, Citrucel, and Benefiber. If you are still having trouble, try adding an osmotic laxative such as Miralax. If all of these changes do not work, Dietitian.   - Refill LINZESS 145 MCG CAPS capsule; Take 1 capsule (145 mcg total) by mouth daily.  Dispense: 30 capsule; Refill: 0  3. Vitamin D deficiency Not at goal. Current vitamin D is 40.5, tested on 05/03/2020. Optimal goal > 50 ng/dL.  She is taking vitamin D 50,000 IU weekly and OTC vitamin D 2,000 IU daily.  Tolerating OTC and weekly well, without symptoms or concerns.  Plan: Continue to take prescription Vitamin D @50 ,000 IU every week as prescribed and OTC vitamin D 2,000 IU daily.  Follow-up for routine testing of Vitamin D, at least 2-3 times per year to avoid over-replacement.  No need for refill as she was given a 90-day supply at her last office visit.  4. Obesity, current BMI 40.8  Course: Caitlyn [name] is currently in the action stage of change. As such, her goal is to continue with weight loss efforts.   Nutrition goals: She has agreed to the Category  2 Plan with 250-300 calories and 25+ grams of protein at breakfast plus lunch options.   Exercise goals: As is.  Behavioral modification strategies: increasing lean protein intake, decreasing simple carbohydrates, increasing high fiber foods, emotional eating strategies and planning for success.  Caitlyn [name] has agreed to follow-up with our clinic in 2-3 weeks. She was informed of the importance of frequent follow-up visits to maximize her success with intensive lifestyle modifications for her multiple health conditions.   Objective:   Blood pressure  140/82, pulse (!) 57, temperature 98.2 F (36.8 C), height 5\' 5"  (1.651 m), weight 245 lb (111.1 kg), SpO2 98 %. Body mass index is 40.77 kg/m.  General: Cooperative, alert, well developed, in no acute distress. HEENT: Conjunctivae and lids unremarkable. Cardiovascular: Regular rhythm.  Lungs: Normal work of breathing. Neurologic: No focal deficits.   Lab Results  Component Value Date   CREATININE 0.88 05/03/2020   BUN 21 05/03/2020   NA 142 05/03/2020   K 4.4 05/03/2020   CL 104 05/03/2020   CO2 18 (L) 05/03/2020   Lab Results  Component Value Date   ALT 11 05/03/2020   AST 19 05/03/2020   ALKPHOS 141 (H) 05/03/2020   BILITOT 0.2 05/03/2020   Lab Results  Component Value Date   HGBA1C 5.4 05/03/2020   HGBA1C 6.0 (H) 09/09/2018   HGBA1C 5.7 (H) 01/13/2018   Lab Results  Component Value Date   INSULIN 14.6 05/03/2020   INSULIN 27.7 (H) 01/13/2018   Lab Results  Component Value Date   TSH 0.716 05/03/2020   Lab Results  Component Value Date   CHOL 173 05/03/2020   HDL 58 05/03/2020   LDLCALC 98 05/03/2020   LDLDIRECT 156.4 08/02/2012   TRIG 96 05/03/2020   CHOLHDL 3.0 05/03/2020   Lab Results  Component Value Date   WBC 5.6 05/03/2020   HGB 14.4 05/03/2020   HCT 44.6 05/03/2020   MCV 93 05/03/2020   PLT 232 05/03/2020   Lab Results  Component Value Date   FERRITIN 351 (H) 09/12/2018   Obesity Behavioral Intervention:   Approximately 15 minutes were spent on the discussion below.  ASK: We discussed the diagnosis of obesity with 09/14/2018 today and Caitlyn [name] agreed to give Caitlyn [name] to discuss obesity behavioral modification therapy today.  ASSESS: Caitlyn [name] has the diagnosis of obesity and her BMI today is 40.8. Caitlyn [name] is in the action stage of change.   ADVISE: Caitlyn [name] was educated on the multiple health risks of obesity as well as the benefit of weight loss to improve her health. She was advised of the need for long term treatment and the  importance of lifestyle modifications to improve her current health and to decrease her risk of future health problems.  AGREE: Multiple dietary modification options and treatment options were discussed and Caitlyn [name] agreed to follow the recommendations documented in the above note.  ARRANGE: Caitlyn [name] was educated on the importance of frequent visits to treat obesity as outlined per CMS and USPSTF guidelines and agreed to schedule her next follow up appointment today.  Attestation Statements:   Reviewed by clinician on day of visit: allergies, medications, problem list, medical history, surgical history, family history, social history, and previous encounter notes.  I, Caitlyn [name], CMA, am acting as Insurance claims handler for Energy manager, DO.  I have reviewed the above documentation for accuracy and completeness, and I agree with the above. [employer], D.O.  The 21st Century Cures Act was signed into law in  2016 which includes the topic of electronic health records.  This provides immediate access to information in MyChart.  This includes consultation notes, operative notes, office notes, lab results and pathology reports.  If you have any questions about what you read please let us know at your next visit [name] we can discuss your concerns and take corrective action if need be.  We are right here with you.

## 2020-07-30 ENCOUNTER — Other Ambulatory Visit (INDEPENDENT_AMBULATORY_CARE_PROVIDER_SITE_OTHER): Payer: Self-pay | Admitting: Bariatrics

## 2020-07-30 ENCOUNTER — Ambulatory Visit (INDEPENDENT_AMBULATORY_CARE_PROVIDER_SITE_OTHER): Payer: Medicare Other | Admitting: Family Medicine

## 2020-07-30 DIAGNOSIS — E876 Hypokalemia: Secondary | ICD-10-CM

## 2020-07-31 ENCOUNTER — Other Ambulatory Visit: Payer: Self-pay

## 2020-07-31 ENCOUNTER — Ambulatory Visit (INDEPENDENT_AMBULATORY_CARE_PROVIDER_SITE_OTHER): Payer: Medicare Other | Admitting: Primary Care

## 2020-07-31 ENCOUNTER — Other Ambulatory Visit (INDEPENDENT_AMBULATORY_CARE_PROVIDER_SITE_OTHER): Payer: Self-pay | Admitting: Family Medicine

## 2020-07-31 ENCOUNTER — Encounter: Payer: Self-pay | Admitting: Primary Care

## 2020-07-31 VITALS — BP 148/82 | HR 68 | Temp 97.7°F | Ht 65.0 in | Wt 248.8 lb

## 2020-07-31 DIAGNOSIS — G4733 Obstructive sleep apnea (adult) (pediatric): Secondary | ICD-10-CM

## 2020-07-31 DIAGNOSIS — R7303 Prediabetes: Secondary | ICD-10-CM

## 2020-07-31 DIAGNOSIS — F5101 Primary insomnia: Secondary | ICD-10-CM | POA: Diagnosis not present

## 2020-07-31 MED ORDER — ZOLPIDEM TARTRATE 5 MG PO TABS: 5.0000 mg | ORAL_TABLET | Freq: Every evening | ORAL | 0 refills | Status: DC | PRN

## 2020-07-31 NOTE — Progress Notes (Signed)
@Patient  ID: , female    DOB: 11/28/1950, 70 y.o.   MRN: [id number]  Chief Complaint  Patient presents with  . Follow-up    Reports still having insomnia    Referring provider: [id number], MD  HPI: 70 year old female, former smoker quit in April 2011 (35 pack year hx). PMH significant for HTN, carotid stenosis, CAD, OSA, seasonal and perennial allergic rhinitis. Patient of Dr. May 2011, last seen 01/23/20.   Previous [facility] pulmonary encounter: 01/23/20- 68 yoF former smoker followed for OSA, Insomnia, complicated by PVD, Hx DVT/ Eliquis, HTN, CAD/ MI, Carotid Stenosis/ L stent, Hypothyroid,  Multiple Pulmonary Nodules, Obesity, Covid infection June, 2020,  HST 11/14/19- AHI 44.9/ hr, desaturation to 80%, body weight 263 lbs Body weight today 258 lbs Covid vax- 2 Phizer Flu vax- had CPAP   Adapt Discussed use of previous CPAP from Dr 11/16/19- using again. We need download from current machine. Will manage based on latest sleep study. Somatic bone and joint pains impact sleep.  Nasal congestion and drainage vary- partly seasonal.    07/31/2020- interim hx  Patient presents today for 6 month follow-up insomnia/OSA. She was under the impression that she no longer had sleep apnea. She has not been wearing CPAP. She has a difficult times falling and staying asleep. She had a repeat home sleep study in August 2021 that showed severe OSA with AHI 44.9/hour. She is interested in inspire device, current BMI is 41. She is attending healthy weight and wellness. She is following diet plan and drinking premiere protein shakes 1-2 times a day.    Allergies  Allergen Reactions  . Amlodipine Swelling  . Statins Other (See Comments)    myalgia    Immunization History  Administered Date(s) Administered  . Influenza Split 03/24/2016, 01/07/2017  . Influenza-Unspecified 01/02/2020  . PFIZER(Purple Top)SARS-COV-2 Vaccination 12/06/2019, 01/23/2020, 01/23/2020  . Pneumococcal  Conjugate-13 01/24/2016  . Pneumococcal-Unspecified 01/24/2016  . Tdap 04/11/2016    Past Medical History:  Diagnosis Date  . Anxiety   . Arthritis    "legs" (01/07/2016)  . Back pain   . Back pain   . CAD 06/2009   a.  s/p MI 4/11: tx with DES x 2 to RCA;  b.  LHC 03/17/11: LAD 30-40%, distal LAD 30-40%, OM2 40-50%, RCA stent patent, EF greater than 70%.;  c.  Lexiscan Myoview (12/15):  Mild apical thinning, no ischemia, EF 61%; normal study  . CAROTID STENOSIS    carotid Dopplers 03/25/11: RICA 60-79%; LICA 0-39%.  Follow up recommended in 6 months.  . Constipation   . Constipation   . COPD (chronic obstructive pulmonary disease) (HCC)   . Depression   . Gout    "on RX for it" (01/07/2016)  . Heart disease   . History of heart attack   . Hx of blood clots   . HYPERLIPIDEMIA   . HYPERTENSION   . HYPOTHYROIDISM    post ablation tx Graves  . Hypothyroidism   . Joint pain   . Myocardial infarction [facility]) 2012   "mini"  . OSA on CPAP   . Osteoarthritis   . Pre-diabetes   . PVD   . Rheumatoid arthritis (HCC)   . Sleep apnea   . SOBOE (shortness of breath on exertion)   . TOBACCO ABUSE quit 06/2009  . Varicose veins   . VITAMIN D DEFICIENCY    DEXA 04/2009 normal    Tobacco History: Social History   Tobacco Use  Smoking Status Former Smoker  . Packs/day: 1.00  . Years: 35.00  . Pack years: 35.00  . Types: Cigarettes  . Quit date: 06/30/2009  . Years since quitting: 11.0  Smokeless Tobacco Never Used   Counseling given: Not Answered   Outpatient Medications Prior to Visit  Medication Sig Dispense Refill  . allopurinol (ZYLOPRIM) 300 MG tablet Take 300 mg by mouth daily.    . bumetanide (BUMEX) 1 MG tablet Take 1 mg by mouth daily.    . carvedilol (COREG) 6.25 MG tablet Take 1 tablet (6.25 mg total) by mouth 2 (two) times daily with a meal. 60 tablet 0  . Cholecalciferol (VITAMIN D) 50 MCG (2000 UT) tablet Take 1 tablet (2,000 Units total) by mouth daily.    .  clopidogrel (PLAVIX) 75 MG tablet Take 75 mg by mouth daily.   2  . colchicine 0.6 MG tablet Take 0.6 mg by mouth daily as needed (for flare ups). Flare ups    . furosemide (LASIX) 40 MG tablet Take 40 mg by mouth daily.    Marland Kitchen levothyroxine (SYNTHROID, LEVOTHROID) 125 MCG tablet Take 125 mcg by mouth daily before breakfast.    . LINZESS 145 MCG CAPS capsule Take 1 capsule (145 mcg total) by mouth daily. 30 capsule 0  . losartan (COZAAR) 50 MG tablet Take 1 tablet (50 mg total) by mouth daily. 90 tablet 3  . nitroGLYCERIN (NITROSTAT) 0.4 MG SL tablet     . potassium chloride (KLOR-CON) 10 MEQ tablet Take 1 tablet (10 mEq total) by mouth daily as needed. Taking 1 tablet when taking Lasix 30 tablet 0  . rosuvastatin (CRESTOR) 10 MG tablet One tab po every other night b-4 bed    . Semaglutide, 1 MG/DOSE, (OZEMPIC, 1 MG/DOSE,) 2 MG/1.5ML SOPN Inject 1 mg into the skin once a week. 3 mL 0  . Vitamin D, Ergocalciferol, (DRISDOL) 1.25 MG (50000 UNIT) CAPS capsule Take 1 capsule (50,000 Units total) by mouth every 7 (seven) days. 4 capsule 0  . zinc gluconate 50 MG tablet Take 50 mg by mouth daily.    . clonazePAM (KLONOPIN) 1 MG tablet Take 1 tablet (1 mg total) by mouth at bedtime. 30 tablet 5   No facility-administered medications prior to visit.    Review of Systems  Review of Systems  Constitutional: Positive for fatigue.  Respiratory: Negative.   Psychiatric/Behavioral: Positive for sleep disturbance.    Physical Exam  BP (!) 148/82 (BP Location: Right Arm, Cuff Size: Normal)   Pulse 68   Temp 97.7 F (36.5 C) (Temporal)   Ht 5\' 5"  (1.651 m)   Wt 248 lb 12.8 oz (112.9 kg)   SpO2 98% Comment: RA  BMI 41.40 kg/m  Physical Exam Constitutional:      Appearance: Normal appearance.  HENT:     Head: Normocephalic and atraumatic.     Mouth/Throat:     Mouth: Mucous membranes are moist.     Pharynx: Oropharynx is clear.     Comments: Mallampati class II Cardiovascular:     Rate and  Rhythm: Normal rate and regular rhythm.  Pulmonary:     Effort: Pulmonary effort is normal.     Breath sounds: Normal breath sounds.  Musculoskeletal:        General: Normal range of motion.  Skin:    General: Skin is warm and dry.  Neurological:     General: No focal deficit present.     Mental Status: She is alert and  oriented to person, place, and time. Mental status is at baseline.  Psychiatric:        Mood and Affect: Mood normal.        Behavior: Behavior normal.        Thought Content: Thought content normal.        Judgment: Judgment normal.      Lab Results:  CBC    Component Value Date/Time   WBC 5.6 05/03/2020 0933   WBC 8.3 09/12/2018 0412   RBC 4.79 05/03/2020 0933   RBC 4.22 09/12/2018 0412   HGB 14.4 05/03/2020 0933   HCT 44.6 05/03/2020 0933   PLT 232 05/03/2020 0933   MCV 93 05/03/2020 0933   MCH 30.1 05/03/2020 0933   MCH 29.6 09/12/2018 0412   MCHC 32.3 05/03/2020 0933   MCHC 32.0 09/12/2018 0412   RDW 13.4 05/03/2020 0933   LYMPHSABS 1.6 05/03/2020 0933   MONOABS 0.6 09/12/2018 0412   EOSABS 0.2 05/03/2020 0933   BASOSABS 0.0 05/03/2020 0933    BMET    Component Value Date/Time   NA 142 05/03/2020 0933   K 4.4 05/03/2020 0933   CL 104 05/03/2020 0933   CO2 18 (L) 05/03/2020 0933   GLUCOSE 85 05/03/2020 0933   GLUCOSE 100 (H) 09/12/2018 0412   BUN 21 05/03/2020 0933   CREATININE 0.88 05/03/2020 0933   CALCIUM 10.0 05/03/2020 0933   GFRNONAA 68 05/03/2020 0933   GFRAA 79 05/03/2020 0933    BNP    Component Value Date/Time   BNP 52.3 09/12/2018 0412    ProBNP No results found for: PROBNP  Imaging: No results found.   Assessment & Plan:   Obstructive sleep apnea - HST 11/14/19 showed severe OSA, AHI 44.9/hr - Patient not currently wearing CPAP, she was under the impression that she did not have sleep apnea  - Instructed patient to resume CPAP, advised she aim to wear min 4-6 hours a night. Sending in DME order to renew CPAP  supplies with [employer].  - Encourage she continue to work on weight loss efforts with healthy weight and wellness. If she is able to get her BMI <35 we can refer her to ENT to discuss Inspire device   - FU in 3 months with Dr. Maple Hudson or APP   Insomnia - Patient reports difficulty falling and staying asleep. She reports trying Melatonin, Trazodone and clonazepam  with no improvement.  - Rx Ambien 5mg  qhs prn insomnia (#30 tabs, no refills) - Advised patient not to combine with other sleep aids and must wear CPAP when taking    , NP 07/31/2020

## 2020-07-31 NOTE — Assessment & Plan Note (Addendum)
-   Patient reports difficulty falling and staying asleep. She reports trying Melatonin, Trazodone and clonazepam  with no improvement.  - Rx Ambien 5mg  qhs prn insomnia (#30 tabs, no refills) - Advised patient not to combine with other sleep aids and must wear CPAP when taking

## 2020-07-31 NOTE — Assessment & Plan Note (Signed)
-   HST 11/14/19 showed severe OSA, AHI 44.9/hr - Patient not currently wearing CPAP, she was under the impression that she did not have sleep apnea  - Instructed patient to resume CPAP, advised she aim to wear min 4-6 hours a night. Sending in DME order to renew CPAP supplies with [facility].  - Encourage she continue to work on weight loss efforts with healthy weight and wellness. If she is able to get her BMI <35 we can refer her to ENT to discuss Inspire device   - FU in 3 months with Dr. Maple Hudson or APP

## 2020-07-31 NOTE — Patient Instructions (Addendum)
Recommendations: - Resume CPAP, aim to wear every night for min 4 hours or longer - Do not drive if experiencing excessive daytime fatigue  - Take Ambien 5mg  at bedtime as needed for insomnia, ensure you have a full 8 hours of sleep. You must wear CPAP if taking sleep aid. Do not combine with other sleep aid, sedation medication or alcohol.   Orders: - Renew CPAP supplies with [facility]  Rx: - Ambien 5mg    Follow-up: - 3 month with Dr. or APP

## 2020-07-31 NOTE — Telephone Encounter (Signed)
Dr.Opalski ?

## 2020-08-02 ENCOUNTER — Ambulatory Visit: Payer: Medicare Other | Admitting: Internal Medicine

## 2020-08-04 ENCOUNTER — Other Ambulatory Visit: Payer: Self-pay

## 2020-08-04 DIAGNOSIS — I739 Peripheral vascular disease, unspecified: Secondary | ICD-10-CM

## 2020-08-04 DIAGNOSIS — I6523 Occlusion and stenosis of bilateral carotid arteries: Secondary | ICD-10-CM

## 2020-08-05 ENCOUNTER — Other Ambulatory Visit (INDEPENDENT_AMBULATORY_CARE_PROVIDER_SITE_OTHER): Payer: Self-pay | Admitting: Family Medicine

## 2020-08-05 DIAGNOSIS — R7303 Prediabetes: Secondary | ICD-10-CM

## 2020-08-06 ENCOUNTER — Other Ambulatory Visit (INDEPENDENT_AMBULATORY_CARE_PROVIDER_SITE_OTHER): Payer: Self-pay

## 2020-08-06 ENCOUNTER — Ambulatory Visit (HOSPITAL_COMMUNITY)
Admission: RE | Admit: 2020-08-06 | Payer: Medicare Other | Source: Ambulatory Visit | Attending: Vascular Surgery | Admitting: Vascular Surgery

## 2020-08-06 ENCOUNTER — Ambulatory Visit: Payer: Medicare Other

## 2020-08-06 ENCOUNTER — Other Ambulatory Visit (INDEPENDENT_AMBULATORY_CARE_PROVIDER_SITE_OTHER): Payer: Self-pay | Admitting: Family Medicine

## 2020-08-06 DIAGNOSIS — K5909 Other constipation: Secondary | ICD-10-CM

## 2020-08-06 DIAGNOSIS — R7303 Prediabetes: Secondary | ICD-10-CM

## 2020-08-06 NOTE — Telephone Encounter (Signed)
Pt called in and stated that she needs a refill on her Ozempic sent to [facility] on [address]. Please advise

## 2020-08-08 NOTE — Telephone Encounter (Signed)
Pt requesting a refill.  Had a no show on 07/30/20.  Next appt on 08/20/20

## 2020-08-09 ENCOUNTER — Ambulatory Visit: Payer: Medicare Other | Admitting: Internal Medicine

## 2020-08-09 ENCOUNTER — Other Ambulatory Visit: Payer: Self-pay | Admitting: *Deleted

## 2020-08-09 DIAGNOSIS — I825Y1 Chronic embolism and thrombosis of unspecified deep veins of right proximal lower extremity: Secondary | ICD-10-CM

## 2020-08-20 ENCOUNTER — Ambulatory Visit (INDEPENDENT_AMBULATORY_CARE_PROVIDER_SITE_OTHER): Payer: Medicare Other | Admitting: Family Medicine

## 2020-08-23 ENCOUNTER — Encounter (INDEPENDENT_AMBULATORY_CARE_PROVIDER_SITE_OTHER): Payer: Self-pay | Admitting: Adult Health

## 2020-08-23 ENCOUNTER — Ambulatory Visit (INDEPENDENT_AMBULATORY_CARE_PROVIDER_SITE_OTHER): Payer: Medicare Other | Admitting: Adult Health

## 2020-08-23 ENCOUNTER — Other Ambulatory Visit: Payer: Self-pay

## 2020-08-23 VITALS — BP 122/78 | HR 63 | Temp 98.5°F | Ht 65.0 in | Wt 244.0 lb

## 2020-08-23 DIAGNOSIS — R7303 Prediabetes: Secondary | ICD-10-CM | POA: Diagnosis not present

## 2020-08-23 DIAGNOSIS — Z6841 Body Mass Index (BMI) 40.0 and over, adult: Secondary | ICD-10-CM

## 2020-08-23 DIAGNOSIS — K5909 Other constipation: Secondary | ICD-10-CM | POA: Diagnosis not present

## 2020-08-23 DIAGNOSIS — I10 Essential (primary) hypertension: Secondary | ICD-10-CM

## 2020-08-23 MED ORDER — OZEMPIC (1 MG/DOSE) 2 MG/1.5ML ~~LOC~~ SOPN: 1.0000 mg | PEN_INJECTOR | SUBCUTANEOUS | 0 refills | Status: DC

## 2020-08-23 MED ORDER — TRULICITY 0.75 MG/0.5ML ~~LOC~~ SOAJ: 0.7500 mg | SUBCUTANEOUS | 0 refills | Status: DC

## 2020-08-29 ENCOUNTER — Ambulatory Visit (INDEPENDENT_AMBULATORY_CARE_PROVIDER_SITE_OTHER): Payer: Medicare Other | Admitting: Physician Assistant

## 2020-08-29 ENCOUNTER — Ambulatory Visit (HOSPITAL_COMMUNITY)
Admission: RE | Admit: 2020-08-29 | Discharge: 2020-08-29 | Disposition: A | Discharge status: Home / Self Care | Payer: Medicare Other | Source: Ambulatory Visit | Attending: Vascular Surgery | Admitting: Vascular Surgery

## 2020-08-29 ENCOUNTER — Other Ambulatory Visit: Payer: Self-pay

## 2020-08-29 VITALS — BP 128/72 | HR 68 | Temp 97.9°F | Resp 16 | Ht 66.0 in | Wt 248.6 lb

## 2020-08-29 DIAGNOSIS — I825Y1 Chronic embolism and thrombosis of unspecified deep veins of right proximal lower extremity: Secondary | ICD-10-CM | POA: Insufficient documentation

## 2020-08-29 DIAGNOSIS — M25561 Pain in right knee: Secondary | ICD-10-CM

## 2020-08-29 DIAGNOSIS — I6523 Occlusion and stenosis of bilateral carotid arteries: Secondary | ICD-10-CM

## 2020-08-29 DIAGNOSIS — I739 Peripheral vascular disease, unspecified: Secondary | ICD-10-CM | POA: Diagnosis not present

## 2020-08-29 DIAGNOSIS — M25562 Pain in left knee: Secondary | ICD-10-CM

## 2020-08-29 NOTE — Progress Notes (Signed)
Chief Complaint:   OBESITY Caitlyn [name] is here to discuss her progress with her obesity treatment plan along with follow-up of her obesity related diagnoses. Caitlyn [name] is on the Category 2 Plan and keeping a food journal and adhering to recommended goals of 250-300 calories and 25 g protein with breakfast and lunch options and states she is following her eating plan approximately 65% of the time. Caitlyn [name] states she is not currently exercising.  Today's visit was #: 6 Starting weight: 248 lbs Starting date: 05/03/2020 Today's weight: 244 lbs Today's date: 08/23/2020 Total lbs lost to date: 4 Total lbs lost since last in-office visit: 1  Interim History: Caitlyn [name] recently traveled to [facility] for a day trip. She ate off plan during beach trip and over [employer] [employer] [facility] [place] Day holiday.  Of Note: Caitlyn [name] started on Ozempic 04/2020, titrated up to 1 mg, and tolerated it well. She has been off Ozempic for 3 weeks and is eager to restart. Changed to Trulicity, as Ozempic is out of stock.  Subjective:   1. Pre-diabetes Caitlyn [name] was started on Ozempic 04/2020. She titrated up to 1 mg and tolerated it well. She has been out of Ozempic for 3 weeks and is eager to re-start. She denies family hx of MTC or personal hx of pancreatitis.   Lab Results  Component Value Date   HGBA1C 5.4 05/03/2020   Lab Results  Component Value Date   INSULIN 14.6 05/03/2020   INSULIN 27.7 (H) 01/13/2018   2. Chronic constipation Caitlyn [name] reports chronic constipation for over 10 years. She started Linzess 145 mcg approximately 6 months ago. She denies abdominal pain or hematochezia. She denies 1st degree family history of colon cancer.  3. Essential hypertension, benign BP/HR excellent at OV. Caitlyn [name] is on Coreg 6.25 mg BID, Lasix 40 mg QD, and Cozaar 50 mg QD.  BP Readings from Last 3 Encounters:  08/23/20 122/78  07/31/20 (!) 148/82  07/12/20 (!) 150/80   Assessment/Plan:   1. Pre-diabetes Caitlyn [name] will continue to  work on weight loss, exercise, and decreasing simple carbohydrates to help decrease the risk of diabetes.  -Start Trulicity 0.75 mg once a week, as prescribed below.  - Dulaglutide (TRULICITY) 0.75 MG/0.5ML SOPN; Inject 0.75 mg into the skin once a week.  Dispense: 3 mL; Refill: 0  2. Chronic constipation Caitlyn [name] was informed that a decrease in bowel movement frequency is normal while losing weight, but stools should not be hard or painful. Orders and follow up as documented in patient record.  -Continue Linzess 145 mcg. Follow up with GI about updating colonoscopy.  Counseling Getting to Good Bowel Health: Your goal is to have one soft bowel movement each day. Drink at least 8 glasses of water each day. Eat plenty of fiber (goal is over 25 grams each day). It is best to get most of your fiber from dietary sources which includes leafy green vegetables, fresh fruit, and whole grains. You may need to add fiber with the help of OTC fiber supplements. These include Metamucil, Citrucel, and Flaxseed. If you are still having trouble, try adding Miralax or Magnesium Citrate. If all of these changes do not work, Dietitian.  3. Essential hypertension, benign Caitlyn [name] is working on healthy weight loss and exercise to improve blood pressure control. We will watch for signs of hypotension as she continues her lifestyle modifications. -Continue current anti-hypertensive regimen.  4. Obesity, current BMI 40.6  Caitlyn [name] is currently in the action stage of change. As such, her  goal is to continue with weight loss efforts. She has agreed to the Category 2 Plan.   Handout: Additional Breakfast Options -Check fasting labs at next OV.  Exercise goals: No exercise has been prescribed at this time.- Left hip pain.  Behavioral modification strategies: increasing lean protein intake, decreasing simple carbohydrates, meal planning and cooking strategies, keeping healthy foods in the home, and planning for  success.  Caitlyn [name] has agreed to follow-up with our clinic in 2-3 weeks. She was informed of the importance of frequent follow-up visits to maximize her success with intensive lifestyle modifications for her multiple health conditions.   Objective:   Blood pressure 122/78, pulse 63, temperature 98.5 F (36.9 C), height 5\' 5"  (1.651 m), weight 244 lb (110.7 kg), SpO2 98 %. Body mass index is 40.6 kg/m.  General: Cooperative, alert, well developed, in no acute distress. HEENT: Conjunctivae and lids unremarkable. Cardiovascular: Regular rhythm.  Lungs: Normal work of breathing. Neurologic: No focal deficits.   Lab Results  Component Value Date   CREATININE 0.88 05/03/2020   BUN 21 05/03/2020   NA 142 05/03/2020   K 4.4 05/03/2020   CL 104 05/03/2020   CO2 18 (L) 05/03/2020   Lab Results  Component Value Date   ALT 11 05/03/2020   AST 19 05/03/2020   ALKPHOS 141 (H) 05/03/2020   BILITOT 0.2 05/03/2020   Lab Results  Component Value Date   HGBA1C 5.4 05/03/2020   HGBA1C 6.0 (H) 09/09/2018   HGBA1C 5.7 (H) 01/13/2018   Lab Results  Component Value Date   INSULIN 14.6 05/03/2020   INSULIN 27.7 (H) 01/13/2018   Lab Results  Component Value Date   TSH 0.716 05/03/2020   Lab Results  Component Value Date   CHOL 173 05/03/2020   HDL 58 05/03/2020   LDLCALC 98 05/03/2020   LDLDIRECT 156.4 08/02/2012   TRIG 96 05/03/2020   CHOLHDL 3.0 05/03/2020   Lab Results  Component Value Date   WBC 5.6 05/03/2020   HGB 14.4 05/03/2020   HCT 44.6 05/03/2020   MCV 93 05/03/2020   PLT 232 05/03/2020   Lab Results  Component Value Date   FERRITIN 351 (H) 09/12/2018    Obesity Behavioral Intervention:   Approximately 15 minutes were spent on the discussion below.  ASK: We discussed the diagnosis of obesity with 09/14/2018 today and Caitlyn [name] agreed to give Caitlyn [name] permission to discuss obesity behavioral modification therapy today.  ASSESS: Caitlyn [name] has the diagnosis of obesity  and her BMI today is 40.6. Caitlyn [name] is in the action stage of change.   ADVISE: Caitlyn [name] was educated on the multiple health risks of obesity as well as the benefit of weight loss to improve her health. She was advised of the need for long term treatment and the importance of lifestyle modifications to improve her current health and to decrease her risk of future health problems.  AGREE: Multiple dietary modification options and treatment options were discussed and Shawndrea agreed to follow the recommendations documented in the above note.  ARRANGE: Mckynna was educated on the importance of frequent visits to treat obesity as outlined per CMS and USPSTF guidelines and agreed to schedule her next follow up appointment today.  Attestation Statements:   Reviewed by clinician on day of visit: allergies, medications, problem list, medical history, surgical history, family history, social history, and previous encounter notes.  Caitlyn [name], CMA, am acting as transcriptionist for Edmund Hilda, NP.  I have reviewed the above documentation for accuracy  and completeness, and I agree with the above. -  Jamee Pacholski d. Symphany Fleissner, NP-C

## 2020-08-29 NOTE — Progress Notes (Signed)
Office Note     CC:  follow up Requesting Provider:  Overton Mam, DO  HPI: Caitlyn [name] is a 70 y.o. ([birth year]) female who presents for follow up evaluation of RLE pain and swelling. She has history of DVT in the popliteal vein in the past which was treated with Eliquis. She was recently seen by Dr. Darrick Penna in April and she was complaining of several week history of pain in left high and left groin along with right leg pain. The pain is more so when she is trying to get up from seated or lying position or at night time. She does not really have any claudication like pain, rest pain or tissue loss. She does have swelling in both legs that is worse with prolonged standing. She explains that she works for a [employer] and her legs usually bother her at the end of the day. She has previously been evaluated for venous reflux and does have some deep and superficial reflux. She says she cannot tolerate putting the stockings on and she does not regularly elevate. She has a known history of mild peripheral arterial disease but her symptoms have not been felt to be attributed to her arterial disease. She was seen by Orthopedics since her last visit and she reports that she was offered injections vs surgery but she had not yet made a decision.When Dr. Darrick Penna evaluated her he felt that her pain was likely multifactorial but recommended DVT duplex for further evaluation with her history  She has a history of chronic back pain, coronary artery disease, and moderate carotid stenosis as well as hyperlipidemia and hypertension.    The pt is on a statin for cholesterol management.  The pt is not on a daily aspirin.   Other AC:  Plavix The pt is on ARB, BB for hypertension.   The pt is diabetic.   Tobacco hx:  Former, quit 2011  Past Medical History:  Diagnosis Date   Anxiety    Arthritis    "legs" (01/07/2016)   Back pain    Back pain    CAD 06/2009   a.  s/p MI 4/11: tx with DES x 2 to  RCA;  b.  LHC 03/17/11: LAD 30-40%, distal LAD 30-40%, OM2 40-50%, RCA stent patent, EF greater than 70%.;  c.  Lexiscan Myoview (12/15):  Mild apical thinning, no ischemia, EF 61%; normal study   CAROTID STENOSIS    carotid Dopplers 03/25/11: RICA 60-79%; LICA 0-39%.  Follow up recommended in 6 months.   Constipation    Constipation    COPD (chronic obstructive pulmonary disease) (HCC)    Depression    Gout    "on RX for it" (01/07/2016)   Heart disease    History of heart attack    Hx of blood clots    HYPERLIPIDEMIA    HYPERTENSION    HYPOTHYROIDISM    post ablation tx Graves   Hypothyroidism    Joint pain    Myocardial infarction [facility]) 2012   "mini"   OSA on CPAP    Osteoarthritis    Pre-diabetes    PVD    Rheumatoid arthritis (HCC)    Sleep apnea    SOBOE (shortness of breath on exertion)    TOBACCO ABUSE quit 06/2009   Varicose veins    VITAMIN D DEFICIENCY    DEXA 04/2009 normal    Past Surgical History:  Procedure Laterality Date   CARDIAC CATHETERIZATION N/A 01/07/2016   Procedure:  Left Heart Cath and Coronary Angiography;  Surgeon: Rinaldo Cloud, MD;  Location: [facility] INVASIVE CV LAB;  Service: Cardiovascular;  Laterality: N/A;   CARDIAC CATHETERIZATION N/A 01/07/2016   Procedure: Coronary Stent Intervention;  Surgeon: Rinaldo Cloud, MD;  Location: [facility] INVASIVE CV LAB;  Service: Cardiovascular;  Laterality: N/A;   CAROTID STENT Left    bilateral carotid artery disease post stent of left carotid   CESAREAN SECTION  1985   "twins"   CORONARY ANGIOPLASTY     LEFT HEART CATHETERIZATION WITH CORONARY ANGIOGRAM N/A 03/17/2011   Procedure: LEFT HEART CATHETERIZATION WITH CORONARY ANGIOGRAM;  Surgeon: Herby Abraham, MD;  Location: [facility] CATH LAB;  Service: Cardiovascular;  Laterality: N/A;   PERIPHERAL VASCULAR CATHETERIZATION N/A 04/06/2015   Procedure: Abdominal Aortogram;  Surgeon: Sherren Kerns, MD;  Location: [facility] INVASIVE CV LAB;  Service: Cardiovascular;  Laterality:  N/A;   RADIOACTIVE PLAQUE INSERTION     Graves disease post radioactive treatment complicated hypothyroidism    Social History   Socioeconomic History   Marital status: Divorced    Spouse name: Not on file   Number of children: Not on file   Years of education: Not on file   Highest education level: Not on file  Occupational History   Occupation: retired, Comptroller for mental health  Tobacco Use   Smoking status: Former    Packs/day: 1.00    Years: 35.00    Pack years: 35.00    Types: Cigarettes    Quit date: 06/30/2009    Years since quitting: 11.1   Smokeless tobacco: Never  Vaping Use   Vaping Use: Never used  Substance and Sexual Activity   Alcohol use: Yes    Alcohol/week: 2.0 standard drinks    Types: 2 Glasses of wine per week    Comment: occ   Drug use: No   Sexual activity: Not Currently    Comment: Quit smoking after MI- prev 1ppd x 35ys  Other Topics Concern   Not on file  Social History Narrative   Not on file   Social Determinants of Health   Financial Resource Strain: Not on file  Food Insecurity: Not on file  Transportation Needs: Not on file  Physical Activity: Not on file  Stress: Not on file  Social Connections: Not on file  Intimate Partner Violence: Not on file    Family History  Problem Relation Age of Onset   Heart attack Mother    Stroke Mother    Cancer Mother    Varicose Veins Mother    Thyroid disease Mother    Hypertension Mother    Heart disease Mother    Sudden death Mother    Depression Mother    Cancer Father    Heart disease Father    Hypertension Father    Coronary artery disease Other    Hypertension Other    Hypertension Sister    Breast cancer Paternal Aunt     Current Outpatient Medications  Medication Sig Dispense Refill   allopurinol (ZYLOPRIM) 300 MG tablet Take 300 mg by mouth daily.     bumetanide (BUMEX) 1 MG tablet Take 1 mg by mouth daily.     carvedilol (COREG) 6.25 MG tablet Take 1 tablet (6.25 mg  total) by mouth 2 (two) times daily with a meal. 60 tablet 0   Cholecalciferol (VITAMIN D) 50 MCG (2000 UT) tablet Take 1 tablet (2,000 Units total) by mouth daily.     clopidogrel (PLAVIX) 75 MG tablet Take  75 mg by mouth daily.   2   colchicine 0.6 MG tablet Take 0.6 mg by mouth daily as needed (for flare ups). Flare ups     Dulaglutide (TRULICITY) 0.75 MG/0.5ML SOPN Inject 0.75 mg into the skin once a week. 3 mL 0   furosemide (LASIX) 40 MG tablet Take 40 mg by mouth daily.     levothyroxine (SYNTHROID, LEVOTHROID) 125 MCG tablet Take 125 mcg by mouth daily before breakfast.     LINZESS 145 MCG CAPS capsule Take 1 capsule (145 mcg total) by mouth daily. 30 capsule 0   losartan (COZAAR) 50 MG tablet Take 1 tablet (50 mg total) by mouth daily. 90 tablet 3   nitroGLYCERIN (NITROSTAT) 0.4 MG SL tablet      potassium chloride (KLOR-CON) 10 MEQ tablet Take 1 tablet (10 mEq total) by mouth daily as needed. Taking 1 tablet when taking Lasix 30 tablet 0   rosuvastatin (CRESTOR) 10 MG tablet One tab po every other night b-4 bed     Vitamin D, Ergocalciferol, (DRISDOL) 1.25 MG (50000 UNIT) CAPS capsule Take 1 capsule (50,000 Units total) by mouth every 7 (seven) days. 4 capsule 0   zinc gluconate 50 MG tablet Take 50 mg by mouth daily.     zolpidem (AMBIEN) 5 MG tablet Take 1 tablet (5 mg total) by mouth at bedtime as needed for sleep. 30 tablet 0   No current facility-administered medications for this visit.    Allergies  Allergen Reactions   Amlodipine Swelling   Statins Other (See Comments)    myalgia     REVIEW OF SYSTEMS:  [X]  denotes positive finding, [ ]  denotes negative finding Cardiac  Comments:  Chest pain or chest pressure:    Shortness of breath upon exertion:    Short of breath when lying flat:    Irregular heart rhythm:        Vascular    Pain in calf, thigh, or hip brought on by ambulation:    Pain in feet at night that wakes you up from your sleep:     Blood clot in your  veins:    Leg swelling:         Pulmonary    Oxygen at home:    Productive cough:     Wheezing:         Neurologic    Sudden weakness in arms or legs:     Sudden numbness in arms or legs:     Sudden onset of difficulty speaking or slurred speech:    Temporary loss of vision in one eye:     Problems with dizziness:         Gastrointestinal    Blood in stool:     Vomited blood:         Genitourinary    Burning when urinating:     Blood in urine:        Psychiatric    Major depression:         Hematologic    Bleeding problems:    Problems with blood clotting too easily:        Skin    Rashes or ulcers:        Constitutional    Fever or chills:      PHYSICAL EXAMINATION:  Vitals:   08/29/20 1421  BP: 128/72  Pulse: 68  Resp: 16  Temp: 97.9 F (36.6 C)  TempSrc: Temporal  SpO2: 97%  Weight: 248 lb 9.6 oz (  112.8 kg)  Height: 5\' 6"  (1.676 m)    General:  WDWN in NAD; vital signs documented above Gait: Normal HENT: WNL, normocephalic Pulmonary: normal non-labored breathing , without wheezing Cardiac: regular HR, without  Murmurs without carotid bruit Abdomen: obese, soft, NT, no masses Vascular Exam/Pulses: No palpable distal pulses bilaterally. She does have some swelling around both ankles. Varicose veins of the left anterior lateral leg Extremities: without ischemic changes, without Gangrene , without cellulitis; without open wounds;  Musculoskeletal: no muscle wasting or atrophy  Neurologic: A&O X 3;  No focal weakness or paresthesias are detected Psychiatric:  The pt has Normal affect.   Non-Invasive Vascular Imaging:   VAS Lower Extremity Venous (DVT): 08/29/20 Summary:  RIGHT:  - There is no evidence of deep vein thrombosis in the lower extremity.  - There is no evidence of superficial venous thrombosis.     - No cystic structure found in the popliteal fossa.  - Previous right leg DVT appears to have resolved.     LEFT:  - No evidence of  deep vein thrombosis in the lower extremity. No indirect evidence of obstruction proximal to the inguinal ligament.       ASSESSMENT/PLAN:: 70 y.o. female here for follow up for BLE pain and swelling. She was concerned at time of her last visit that she may have DVT with her history. Dr. 79 felt that her symptoms were likely multifactorial but not likely venous or arterial in nature. She does not have claudication, rest pain or tissue loss. Her duplex today is negative for DVT bilaterally. Reassurance provided to her. I have recommended she follow up with orthopedics for further evaluation and management  - Recommend daily elevation as needed - She will continue her Statin and Plavix - She will follow up in August for her 1 year carotid duplex and ABI's   September, PA-C [facility] [phone]  Clinic MD: [phone]

## 2020-08-30 ENCOUNTER — Other Ambulatory Visit: Payer: Self-pay

## 2020-08-30 DIAGNOSIS — I739 Peripheral vascular disease, unspecified: Secondary | ICD-10-CM

## 2020-08-30 DIAGNOSIS — I6523 Occlusion and stenosis of bilateral carotid arteries: Secondary | ICD-10-CM

## 2020-09-19 ENCOUNTER — Ambulatory Visit (INDEPENDENT_AMBULATORY_CARE_PROVIDER_SITE_OTHER): Payer: Medicare Other | Admitting: Family Medicine

## 2020-09-19 ENCOUNTER — Encounter (INDEPENDENT_AMBULATORY_CARE_PROVIDER_SITE_OTHER): Payer: Self-pay | Admitting: Family Medicine

## 2020-09-19 ENCOUNTER — Other Ambulatory Visit: Payer: Self-pay

## 2020-09-19 VITALS — BP 116/76 | HR 60 | Temp 97.6°F | Ht 66.0 in | Wt 243.0 lb

## 2020-09-19 DIAGNOSIS — R7303 Prediabetes: Secondary | ICD-10-CM

## 2020-09-19 DIAGNOSIS — R7989 Other specified abnormal findings of blood chemistry: Secondary | ICD-10-CM

## 2020-09-19 DIAGNOSIS — Z6841 Body Mass Index (BMI) 40.0 and over, adult: Secondary | ICD-10-CM

## 2020-09-19 DIAGNOSIS — K5909 Other constipation: Secondary | ICD-10-CM | POA: Diagnosis not present

## 2020-09-19 MED ORDER — SEMAGLUTIDE (1 MG/DOSE) 4 MG/3ML ~~LOC~~ SOPN: PEN_INJECTOR | SUBCUTANEOUS | Status: DC

## 2020-09-19 MED ORDER — LINZESS 145 MCG PO CAPS: 145.0000 ug | ORAL_CAPSULE | Freq: Every day | ORAL | 0 refills | Status: DC

## 2020-09-21 ENCOUNTER — Other Ambulatory Visit (INDEPENDENT_AMBULATORY_CARE_PROVIDER_SITE_OTHER): Payer: Self-pay | Admitting: Adult Health

## 2020-09-21 DIAGNOSIS — R7303 Prediabetes: Secondary | ICD-10-CM

## 2020-09-24 NOTE — Telephone Encounter (Signed)
Dr.Opalski ?

## 2020-09-25 ENCOUNTER — Telehealth: Payer: Self-pay

## 2020-09-25 NOTE — Telephone Encounter (Signed)
Please inform pt that she has 3 refills on this medication.  Last fill 07/12/20  #90/3

## 2020-09-25 NOTE — Telephone Encounter (Signed)
Pt has 3 refills on medication.  Please inform pt.

## 2020-09-25 NOTE — Telephone Encounter (Signed)
Requesting refill for   Losartan 50MG  tablet.   Pt has appt on 10/12/20.

## 2020-09-27 NOTE — Progress Notes (Signed)
Chief Complaint:   OBESITY Caitlyn [name] is here to discuss her progress with her obesity treatment plan along with follow-up of her obesity related diagnoses.   Today's visit was #: 7 Starting weight: 248 lbs Starting date: 05/03/2020 Today's weight: 243 lbs Today's date: 09/19/2020 Weight change since last visit: 1 lb Total lbs lost to date: 5 lbs Body mass index is 39.22 kg/m.  Total weight loss percentage to date: -2.02%  Interim History:  Caitlyn [name] skips meals and endorses that she eats sweets or chips when she knows she should not.  She says she is frustrated that she is not losing more weight and feels she should be despite only being on plan 75% of the time.   Plan:  She will change to journaling [name] we can see what her challenges and barriers are to greater weight loss.   - Also, change back to ozempic from trulicity.   Current Meal Plan: the Category 2 Plan with breakfast options for 75% of the time.  Current Exercise Plan: Walking, cycling for 30 minutes 2-3 times per week. Current Anti-Obesity Medications: Trulicity 0.75 mg .subuc weekly. Side effects: None.    Assessment/Plan:   Medications Discontinued During This Encounter  Medication Reason   Dulaglutide (TRULICITY) 0.75 MG/0.5ML SOPN    LINZESS 145 MCG CAPS capsule Reorder   Meds ordered this encounter  Medications   LINZESS 145 MCG CAPS capsule    Sig: Take 1 capsule (145 mcg total) by mouth daily.    Dispense:  30 capsule    Refill:  0   Semaglutide, 1 MG/DOSE, 4 MG/3ML SOPN    Sig: 1 mg wkly   1. Prediabetes At goal. Goal is HgbA1c < 5.7.  Medication: Trulicity 0.75 mg subcutaneously weekly.  Ozempic was out of stock and [name] Trulicity was given instead.  She says that it does not work as well with hunger and carvings.  She has been on Trulicity for 1 month or [name].  Plan:  She will continue to focus on protein-rich, low simple carbohydrate foods. We reviewed the importance of hydration, regular exercise for  stress reduction, and restorative sleep.  Discontinue Trulicity and start Ozempic 1 mg subcutaneously weekly (she has medication at home).  A1c was 5.4 ~3 months ago and stable.  Only 5 pounds weight loss since then.  Will recheck at 6 month mark or sooner if much greater wt loss is acheived.  Lab Results  Component Value Date   HGBA1C 5.4 05/03/2020   Lab Results  Component Value Date   INSULIN 14.6 05/03/2020   INSULIN 27.7 (H) 01/13/2018   - Start semaglutide, 1 MG/DOSE, 4 MG/3ML SOPN; 1 mg wkly    2. Chronic constipation Takes Linzess daily.  No side effects.  Drinks 84 ounces of liquids daily.  Last CMP stable.  Plan:  Refill Linzess.  Increase water intake and activity level.  - Refill LINZESS 145 MCG CAPS capsule; Take 1 capsule (145 mcg total) by mouth daily.  Dispense: 30 capsule; Refill: 0    3. Elevated serum free T4 level With normal TSH and T3.  No new symptoms or any symptoms of hyperthyroidism at all have developed, thus no need for recheck of labs today although pt feels that her thyroid is off and that is why she is not losing.  Plan:  Educated pt/ counseling done. After discussion with patient, she wishes to recheck all labs at the 6 month mark.  Since she is stable, will repeat  around mid August.    4. Obesity BMI today 39.3  Course: Caitlyn [name] is currently in the action stage of change. As such, her goal is to continue with weight loss efforts.   Nutrition goals: She has agreed to change to: keeping a food journal and adhering to recommended goals of 1200-1300 calories and 85+ grams of protein.   Exercise goals:  As is.  Behavioral modification strategies: no skipping meals, avoiding temptations, and planning for success.  Caitlyn [name] has agreed to follow-up with our clinic in 3 weeks. She was informed of the importance of frequent follow-up visits to maximize her success with intensive lifestyle modifications for her multiple health conditions.   Objective:    Blood pressure 116/76, pulse 60, temperature 97.6 F (36.4 C), height 5\' 6"  (1.676 m), weight 243 lb (110.2 kg), SpO2 99 %. Body mass index is 39.22 kg/m.  General: Cooperative, alert, well developed, in no acute distress. HEENT: Conjunctivae and lids unremarkable. Cardiovascular: Regular rhythm.  Lungs: Normal work of breathing. Neurologic: No focal deficits.   Lab Results  Component Value Date   CREATININE 0.88 05/03/2020   BUN 21 05/03/2020   NA 142 05/03/2020   K 4.4 05/03/2020   CL 104 05/03/2020   CO2 18 (L) 05/03/2020   Lab Results  Component Value Date   ALT 11 05/03/2020   AST 19 05/03/2020   ALKPHOS 141 (H) 05/03/2020   BILITOT 0.2 05/03/2020   Lab Results  Component Value Date   HGBA1C 5.4 05/03/2020   HGBA1C 6.0 (H) 09/09/2018   HGBA1C 5.7 (H) 01/13/2018   Lab Results  Component Value Date   INSULIN 14.6 05/03/2020   INSULIN 27.7 (H) 01/13/2018   Lab Results  Component Value Date   TSH 0.716 05/03/2020   Lab Results  Component Value Date   CHOL 173 05/03/2020   HDL 58 05/03/2020   LDLCALC 98 05/03/2020   LDLDIRECT 156.4 08/02/2012   TRIG 96 05/03/2020   CHOLHDL 3.0 05/03/2020   Lab Results  Component Value Date   VD25OH 40.5 05/03/2020   VD25OH 29.4 (L) 01/13/2018   Lab Results  Component Value Date   WBC 5.6 05/03/2020   HGB 14.4 05/03/2020   HCT 44.6 05/03/2020   MCV 93 05/03/2020   PLT 232 05/03/2020   Lab Results  Component Value Date   FERRITIN 351 (H) 09/12/2018   Obesity Behavioral Intervention:   Approximately 15 minutes were spent on the discussion below.  ASK: We discussed the diagnosis of obesity with 09/14/2018 today and Caitlyn [name] agreed to give Caitlyn [name] permission to discuss obesity behavioral modification therapy today.  ASSESS: Caitlyn [name] has the diagnosis of obesity and her BMI today is 39.3. Caitlyn [name] is in the action stage of change.   ADVISE: Caitlyn [name] was educated on the multiple health risks of obesity as well as the  benefit of weight loss to improve her health. She was advised of the need for long term treatment and the importance of lifestyle modifications to improve her current health and to decrease her risk of future health problems.  AGREE: Multiple dietary modification options and treatment options were discussed and Caitlyn [name] agreed to follow the recommendations documented in the above note.  ARRANGE: Caitlyn [name] was educated on the importance of frequent visits to treat obesity as outlined per CMS and USPSTF guidelines and agreed to schedule her next follow up appointment today.  Attestation Statements:   Reviewed by clinician on day of visit: allergies, medications, problem list, medical history, surgical history,  family history, social history, and previous encounter notes.  I, Water quality scientist, CMA, am acting as Location manager for [employer], DO.  I have reviewed the above documentation for accuracy and completeness, and I agree with the above. Marjory Sneddon, D.O.  The [place] was signed into law in 2016 which includes the topic of electronic health records.  This provides immediate access to information in MyChart.  This includes consultation notes, operative notes, office notes, lab results and pathology reports.  If you have any questions about what you read please let us know at your next visit [name] we can discuss your concerns and take corrective action if need be.  We are right here with you.

## 2020-10-10 ENCOUNTER — Ambulatory Visit (INDEPENDENT_AMBULATORY_CARE_PROVIDER_SITE_OTHER): Payer: Medicare Other | Admitting: Family Medicine

## 2020-10-10 ENCOUNTER — Other Ambulatory Visit: Payer: Self-pay

## 2020-10-10 ENCOUNTER — Encounter (INDEPENDENT_AMBULATORY_CARE_PROVIDER_SITE_OTHER): Payer: Self-pay | Admitting: Family Medicine

## 2020-10-10 VITALS — BP 137/82 | HR 61 | Temp 97.5°F | Ht 66.0 in | Wt 247.0 lb

## 2020-10-10 DIAGNOSIS — E1169 Type 2 diabetes mellitus with other specified complication: Secondary | ICD-10-CM | POA: Diagnosis not present

## 2020-10-10 DIAGNOSIS — E559 Vitamin D deficiency, unspecified: Secondary | ICD-10-CM

## 2020-10-10 DIAGNOSIS — Z6841 Body Mass Index (BMI) 40.0 and over, adult: Secondary | ICD-10-CM | POA: Diagnosis not present

## 2020-10-10 MED ORDER — VITAMIN D (ERGOCALCIFEROL) 1.25 MG (50000 UNIT) PO CAPS: 50000.0000 [IU] | ORAL_CAPSULE | ORAL | 0 refills | Status: DC

## 2020-10-10 MED ORDER — SEMAGLUTIDE (1 MG/DOSE) 4 MG/3ML ~~LOC~~ SOPN: 1.0000 mg | PEN_INJECTOR | SUBCUTANEOUS | 0 refills | Status: DC

## 2020-10-12 ENCOUNTER — Encounter: Payer: Self-pay | Admitting: Family Medicine

## 2020-10-12 ENCOUNTER — Ambulatory Visit (INDEPENDENT_AMBULATORY_CARE_PROVIDER_SITE_OTHER): Payer: Medicare Other | Admitting: Family Medicine

## 2020-10-12 ENCOUNTER — Other Ambulatory Visit: Payer: Self-pay

## 2020-10-12 VITALS — BP 118/70 | HR 67 | Temp 97.3°F | Wt 249.2 lb

## 2020-10-12 DIAGNOSIS — E785 Hyperlipidemia, unspecified: Secondary | ICD-10-CM | POA: Diagnosis not present

## 2020-10-12 DIAGNOSIS — I10 Essential (primary) hypertension: Secondary | ICD-10-CM | POA: Diagnosis not present

## 2020-10-12 DIAGNOSIS — E039 Hypothyroidism, unspecified: Secondary | ICD-10-CM

## 2020-10-12 MED ORDER — CARVEDILOL 6.25 MG PO TABS: 6.2500 mg | ORAL_TABLET | Freq: Two times a day (BID) | ORAL | 1 refills | Status: DC

## 2020-10-12 NOTE — Progress Notes (Signed)
Chief Complaint  Patient presents with   Follow-up    F/u bp recheck at home bp ranges 130/85-155/90.    HPI: Caitlyn [name] is a 70 y.o. female here for HTN, HLD, hypothyroidism follow-up. She had labs in 04/2020 with [facility].  Pt is taking losartan 50mg  daily and coreg 6.25mg  BID for HTN.  Pt is taking crestor 10mg  daily for HLD. Pt is taking levothyroxine daily.  She is following with [facility].  BP Readings from Last 3 Encounters:  10/12/20 118/70  10/10/20 137/82  09/19/20 116/76   Lab Results  Component Value Date   CREATININE 0.88 05/03/2020   BUN 21 05/03/2020   NA 142 05/03/2020   K 4.4 05/03/2020   CL 104 05/03/2020   CO2 18 (L) 05/03/2020   Lab Results  Component Value Date   CHOL 173 05/03/2020   HDL 58 05/03/2020   LDLCALC 98 05/03/2020   LDLDIRECT 156.4 08/02/2012   TRIG 96 05/03/2020   CHOLHDL 3.0 05/03/2020   Lab Results  Component Value Date   HGBA1C 5.4 05/03/2020   Lab Results  Component Value Date   TSH 0.716 05/03/2020   T3TOTAL 110 05/03/2020   T4TOTAL 11.8 07/01/2009    Past Medical History:  Diagnosis Date   Anxiety    Arthritis    "legs" (01/07/2016)   Back pain    Back pain    CAD 06/2009   a.  s/p MI 4/11: tx with DES x 2 to RCA;  b.  LHC 03/17/11: LAD 30-40%, distal LAD 30-40%, OM2 40-50%, RCA stent patent, EF greater than 70%.;  c.  Lexiscan Myoview (12/15):  Mild apical thinning, no ischemia, EF 61%; normal study   CAROTID STENOSIS    carotid Dopplers 03/25/11: RICA 60-79%; LICA 0-39%.  Follow up recommended in 6 months.   Constipation    Constipation    COPD (chronic obstructive pulmonary disease) (HCC)    Depression    Gout    "on RX for it" (01/07/2016)   Heart disease    History of heart attack    Hx of blood clots    HYPERLIPIDEMIA    HYPERTENSION    HYPOTHYROIDISM    post ablation tx Graves   Hypothyroidism    Joint pain    Myocardial infarction [facility]) 2012   "mini"    OSA on CPAP    Osteoarthritis    Pre-diabetes    PVD    Rheumatoid arthritis (HCC)    Sleep apnea    SOBOE (shortness of breath on exertion)    TOBACCO ABUSE quit 06/2009   Varicose veins    VITAMIN D DEFICIENCY    DEXA 04/2009 normal    Past Surgical History:  Procedure Laterality Date   CARDIAC CATHETERIZATION N/A 01/07/2016   Procedure: Left Heart Cath and Coronary Angiography;  Surgeon: 05/2009, MD;  Location: [facility] INVASIVE CV LAB;  Service: Cardiovascular;  Laterality: N/A;   CARDIAC CATHETERIZATION N/A 01/07/2016   Procedure: Coronary Stent Intervention;  Surgeon: [facility], MD;  Location: [facility] INVASIVE CV LAB;  Service: Cardiovascular;  Laterality: N/A;   CAROTID STENT Left    bilateral carotid artery disease post stent of left carotid   CESAREAN SECTION  1985   "twins"   CORONARY ANGIOPLASTY     LEFT HEART CATHETERIZATION WITH CORONARY ANGIOGRAM N/A 03/17/2011   Procedure: LEFT HEART CATHETERIZATION WITH CORONARY ANGIOGRAM;  Surgeon: Rinaldo Cloud, MD;  Location: [facility]  CATH LAB;  Service: Cardiovascular;  Laterality: N/A;   PERIPHERAL VASCULAR CATHETERIZATION N/A 04/06/2015   Procedure: Abdominal Aortogram;  Surgeon: Sherren Kerns, MD;  Location: [facility] INVASIVE CV LAB;  Service: Cardiovascular;  Laterality: N/A;   RADIOACTIVE PLAQUE INSERTION     Graves disease post radioactive treatment complicated hypothyroidism    Social History   Socioeconomic History   Marital status: Divorced    Spouse name: Not on file   Number of children: Not on file   Years of education: Not on file   Highest education level: Not on file  Occupational History   Occupation: retired, Comptroller for mental health  Tobacco Use   Smoking status: Former    Packs/day: 1.00    Years: 35.00    Pack years: 35.00    Types: Cigarettes    Quit date: 06/30/2009    Years since quitting: 11.2   Smokeless tobacco: Never  Vaping Use   Vaping Use: Never used  Substance and Sexual Activity    Alcohol use: Yes    Alcohol/week: 2.0 standard drinks    Types: 2 Glasses of wine per week    Comment: occ   Drug use: No   Sexual activity: Not Currently    Comment: Quit smoking after MI- prev 1ppd x 35ys  Other Topics Concern   Not on file  Social History Narrative   Not on file   Social Determinants of Health   Financial Resource Strain: Not on file  Food Insecurity: Not on file  Transportation Needs: Not on file  Physical Activity: Not on file  Stress: Not on file  Social Connections: Not on file  Intimate Partner Violence: Not on file    Family History  Problem Relation Age of Onset   Heart attack Mother    Stroke Mother    Cancer Mother    Varicose Veins Mother    Thyroid disease Mother    Hypertension Mother    Heart disease Mother    Sudden death Mother    Depression Mother    Cancer Father    Heart disease Father    Hypertension Father    Coronary artery disease Other    Hypertension Other    Hypertension Sister    Breast cancer Paternal Aunt      Immunization History  Administered Date(s) Administered   Influenza Split 03/24/2016, 01/07/2017   Influenza-Unspecified 01/02/2020   PFIZER(Purple Top)SARS-COV-2 Vaccination 12/06/2019, 01/23/2020, 01/23/2020   Pneumococcal Conjugate-13 01/24/2016   Pneumococcal-Unspecified 01/24/2016   Tdap 04/11/2016    Outpatient Encounter Medications as of 10/12/2020  Medication Sig   allopurinol (ZYLOPRIM) 300 MG tablet Take 300 mg by mouth daily.   bumetanide (BUMEX) 1 MG tablet Take 1 mg by mouth daily.   carvedilol (COREG) 6.25 MG tablet Take 1 tablet (6.25 mg total) by mouth 2 (two) times daily with a meal.   Cholecalciferol (VITAMIN D) 50 MCG (2000 UT) tablet Take 1 tablet (2,000 Units total) by mouth daily.   clopidogrel (PLAVIX) 75 MG tablet Take 75 mg by mouth daily.    colchicine 0.6 MG tablet Take 0.6 mg by mouth daily as needed (for flare ups). Flare ups   furosemide (LASIX) 40 MG tablet Take 40 mg by  mouth daily.   levothyroxine (SYNTHROID, LEVOTHROID) 125 MCG tablet Take 125 mcg by mouth daily before breakfast.   LINZESS 145 MCG CAPS capsule Take 1 capsule (145 mcg total) by mouth daily.   losartan (COZAAR) 50 MG tablet Take 1  tablet (50 mg total) by mouth daily.   nitroGLYCERIN (NITROSTAT) 0.4 MG SL tablet    potassium chloride (KLOR-CON) 10 MEQ tablet Take 1 tablet (10 mEq total) by mouth daily as needed. Taking 1 tablet when taking Lasix   rosuvastatin (CRESTOR) 10 MG tablet One tab po every other night b-4 bed   Semaglutide, 1 MG/DOSE, 4 MG/3ML SOPN Inject 1 mg into the skin every 7 (seven) days. 1 mg wkly   Vitamin D, Ergocalciferol, (DRISDOL) 1.25 MG (50000 UNIT) CAPS capsule Take 1 capsule (50,000 Units total) by mouth every 7 (seven) days.   zinc gluconate 50 MG tablet Take 50 mg by mouth daily.   zolpidem (AMBIEN) 5 MG tablet Take 1 tablet (5 mg total) by mouth at bedtime as needed for sleep.   No facility-administered encounter medications on file as of 10/12/2020.     ROS: Pertinent positives and negatives noted in HPI. Remainder of ROS non-contributory    Allergies  Allergen Reactions   Amlodipine Swelling   Statins Other (See Comments)    myalgia    BP 118/70 (BP Location: Right Arm, Patient Position: Sitting, Cuff Size: Large)   Pulse 67   Temp (!) 97.3 F (36.3 C) (Temporal)   Wt 249 lb 3.2 oz (113 kg)   SpO2 96%   BMI 40.22 kg/m   Wt Readings from Last 3 Encounters:  10/12/20 249 lb 3.2 oz (113 kg)  10/10/20 247 lb (112 kg)  09/19/20 243 lb (110.2 kg)   Temp Readings from Last 3 Encounters:  10/12/20 (!) 97.3 F (36.3 C) (Temporal)  10/10/20 (!) 97.5 F (36.4 C)  09/19/20 97.6 F (36.4 C)   BP Readings from Last 3 Encounters:  10/12/20 118/70  10/10/20 137/82  09/19/20 116/76   Pulse Readings from Last 3 Encounters:  10/12/20 67  10/10/20 61  09/19/20 60     Physical Exam Constitutional:      General: She is not in acute distress.     Appearance: She is not ill-appearing.  Cardiovascular:     Rate and Rhythm: Normal rate and regular rhythm.     Pulses: Normal pulses.  Pulmonary:     Effort: Pulmonary effort is normal. No respiratory distress.     Breath sounds: No wheezing or rhonchi.  Musculoskeletal:     Right lower leg: No edema.     Left lower leg: No edema.  Neurological:     Mental Status: She is alert and oriented to person, place, and time.  Psychiatric:        Mood and Affect: Mood normal.        Behavior: Behavior normal.     A/P:  1. Essential hypertension, benign - controlled, at goal - cont losartan 50mg  daily - cont coreg 6.25mg  BID - pt follows with cardio and has appt upcoming on Tuesday  2. Hyperlipidemia, unspecified hyperlipidemia type - last FLP in 04/2020 and LDL = 98 - taking crestor 10mg  QOD to limit muscle aches  3. Hypothyroidism, unspecified type - TSH at goal in 04/2020 - cont levothyroxine daily    This visit occurred during the SARS-CoV-2 public health emergency.  Safety protocols were in place, including screening questions prior to the visit, additional usage of staff PPE, and extensive cleaning of exam room while observing appropriate contact time as indicated for disinfecting solutions.

## 2020-10-12 NOTE — Patient Instructions (Addendum)
Dr. Veto Kemps  Cardio: Dr. Tenny Craw Dr. Jacques Navy

## 2020-10-17 NOTE — Progress Notes (Signed)
Chief Complaint:   OBESITY Caitlyn [name] is here to discuss her progress with her obesity treatment plan along with follow-up of her obesity related diagnoses. Caitlyn [name] is on keeping a food journal and adhering to recommended goals of 1200-1300 calories and 85+ grams of protein daily and states she is following her eating plan approximately 50% of the time. Caitlyn [name] states she is walking 1/2 mile 3 times per week.   Today's visit was #: 8 Starting weight: 248 lbs Starting date: 05/03/2020 Today's weight: 247 lbs Today's date: 10/10/2020 Total lbs lost to date: 1 Total lbs lost since last in-office visit: 0  Interim History: Caitlyn [name] is still struggling to get all foods in, and she is not journaling as we discussed in detail at her last office visit.  Subjective:   1. Type 2 diabetes mellitus with other specified complication, without long-term current use of insulin (HCC) Caitlyn [name] is on Ozempic currently. Medications reviewed. Diabetic ROS: no polyuria or polydipsia, no chest pain, dyspnea or TIA's, no numbness, tingling or pain in extremities.   2. Vitamin D deficiency Caitlyn [name] is currently taking prescription vitamin D 50,000 IU each week. She denies nausea, vomiting or muscle weakness.  Assessment/Plan:  No orders of the defined types were placed in this encounter.   Medications Discontinued During This Encounter  Medication Reason   Vitamin D, Ergocalciferol, (DRISDOL) 1.25 MG (50000 UNIT) CAPS capsule Reorder   Semaglutide, 1 MG/DOSE, 4 MG/3ML SOPN Reorder     Meds ordered this encounter  Medications   Semaglutide, 1 MG/DOSE, 4 MG/3ML SOPN    Sig: Inject 1 mg into the skin every 7 (seven) days. 1 mg wkly    Dispense:  3 mL    Refill:  0    Ov for RF   Vitamin D, Ergocalciferol, (DRISDOL) 1.25 MG (50000 UNIT) CAPS capsule    Sig: Take 1 capsule (50,000 Units total) by mouth every 7 (seven) days.    Dispense:  4 capsule    Refill:  0    Ov for RF     1. Type 2 diabetes  mellitus with other specified complication, without long-term current use of insulin (HCC) Caitlyn [name] will continue her medications, and we will refill Ozempic for 1 month. Good blood sugar control is important to decrease the likelihood of diabetic complications such as nephropathy, neuropathy, limb loss, blindness, coronary artery disease, and death. Intensive lifestyle modification including diet, exercise and weight loss are the first line of treatment for diabetes.   - Semaglutide, 1 MG/DOSE, 4 MG/3ML SOPN; Inject 1 mg into the skin every 7 (seven) days. 1 mg wkly  Dispense: 3 mL; Refill: 0  2. Vitamin D deficiency Low Vitamin D level contributes to fatigue and are associated with obesity, breast, and colon cancer. We will refill prescription Vitamin D for 1 month. Caitlyn [name] will follow-up for routine testing of Vitamin D, at least 2-3 times per year to avoid over-replacement.  - Vitamin D, Ergocalciferol, (DRISDOL) 1.25 MG (50000 UNIT) CAPS capsule; Take 1 capsule (50,000 Units total) by mouth every 7 (seven) days.  Dispense: 4 capsule; Refill: 0  3. Obesity with current BMI of 39.9 Caitlyn [name] is currently in the action stage of change. As such, her goal is to continue with weight loss efforts. She has agreed to the Category 2 Plan or keeping a food journal and adhering to recommended goals of 1200-1300 calories and 85+ grams of protein daily.   Exercise goals: As is  Behavioral modification strategies: increasing  lean protein intake, decreasing simple carbohydrates, decreasing eating out, and keeping a strict food journal.  Caitlyn [name] has agreed to follow-up with our clinic in 2 to 3 weeks. She was informed of the importance of frequent follow-up visits to maximize her success with intensive lifestyle modifications for her multiple health conditions.   Objective:   Blood pressure 137/82, pulse 61, temperature (!) 97.5 F (36.4 C), height 5\' 6"  (1.676 m), weight 247 lb (112 kg), SpO2 99 %. Body  mass index is 39.87 kg/m.  General: Cooperative, alert, well developed, in no acute distress. HEENT: Conjunctivae and lids unremarkable. Cardiovascular: Regular rhythm.  Lungs: Normal work of breathing. Neurologic: No focal deficits.   Lab Results  Component Value Date   CREATININE 0.88 05/03/2020   BUN 21 05/03/2020   NA 142 05/03/2020   K 4.4 05/03/2020   CL 104 05/03/2020   CO2 18 (L) 05/03/2020   Lab Results  Component Value Date   ALT 11 05/03/2020   AST 19 05/03/2020   ALKPHOS 141 (H) 05/03/2020   BILITOT 0.2 05/03/2020   Lab Results  Component Value Date   HGBA1C 5.4 05/03/2020   HGBA1C 6.0 (H) 09/09/2018   HGBA1C 5.7 (H) 01/13/2018   Lab Results  Component Value Date   INSULIN 14.6 05/03/2020   INSULIN 27.7 (H) 01/13/2018   Lab Results  Component Value Date   TSH 0.716 05/03/2020   Lab Results  Component Value Date   CHOL 173 05/03/2020   HDL 58 05/03/2020   LDLCALC 98 05/03/2020   LDLDIRECT 156.4 08/02/2012   TRIG 96 05/03/2020   CHOLHDL 3.0 05/03/2020   Lab Results  Component Value Date   VD25OH 40.5 05/03/2020   VD25OH 29.4 (L) 01/13/2018   Lab Results  Component Value Date   WBC 5.6 05/03/2020   HGB 14.4 05/03/2020   HCT 44.6 05/03/2020   MCV 93 05/03/2020   PLT 232 05/03/2020   Lab Results  Component Value Date   FERRITIN 351 (H) 09/12/2018    Obesity Behavioral Intervention:   Approximately 15 minutes were spent on the discussion below.  ASK: We discussed the diagnosis of obesity with 09/14/2018 today and Caitlyn [name] agreed to give Caitlyn [name] permission to discuss obesity behavioral modification therapy today.  ASSESS: Caitlyn [name] has the diagnosis of obesity and her BMI today is 39.89. Caitlyn [name] is in the action stage of change.   ADVISE: Caitlyn [name] was educated on the multiple health risks of obesity as well as the benefit of weight loss to improve her health. She was advised of the need for long term treatment and the importance of lifestyle  modifications to improve her current health and to decrease her risk of future health problems.  AGREE: Multiple dietary modification options and treatment options were discussed and Caitlyn [name] agreed to follow the recommendations documented in the above note.  ARRANGE: Caitlyn [name] was educated on the importance of frequent visits to treat obesity as outlined per CMS and USPSTF guidelines and agreed to schedule her next follow up appointment today.  Attestation Statements:   Reviewed by clinician on day of visit: allergies, medications, problem list, medical history, surgical history, family history, social history, and previous encounter notes.   Caitlyn [name], am acting as transcriptionist for Trude Mcburney, DO.  I have reviewed the above documentation for accuracy and completeness, and I agree with the above. [employer], D.O.  The 21st Century Cures Act was signed into law in 2016 which includes the topic of  electronic health records.  This provides immediate access to information in MyChart.  This includes consultation notes, operative notes, office notes, lab results and pathology reports.  If you have any questions about what you read please let us know at your next visit [name] we can discuss your concerns and take corrective action if need be.  We are right here with you.

## 2020-10-21 ENCOUNTER — Other Ambulatory Visit (INDEPENDENT_AMBULATORY_CARE_PROVIDER_SITE_OTHER): Payer: Self-pay | Admitting: Family Medicine

## 2020-10-21 DIAGNOSIS — K5909 Other constipation: Secondary | ICD-10-CM

## 2020-10-22 ENCOUNTER — Encounter (INDEPENDENT_AMBULATORY_CARE_PROVIDER_SITE_OTHER): Payer: Self-pay | Admitting: Emergency Medicine

## 2020-10-22 NOTE — Telephone Encounter (Signed)
Msg has been sent to patient Via Mychart

## 2020-10-22 NOTE — Telephone Encounter (Signed)
Dr.Opalski ?

## 2020-10-26 ENCOUNTER — Telehealth: Payer: Self-pay

## 2020-10-26 ENCOUNTER — Telehealth: Payer: Self-pay | Admitting: Internal Medicine

## 2020-10-26 NOTE — Telephone Encounter (Signed)
ATC LVMTCB verifying if pt is using C-Pap or not for Monday appt. With CY

## 2020-10-26 NOTE — Telephone Encounter (Signed)
LMTCB for the pt 

## 2020-10-28 NOTE — Progress Notes (Addendum)
HPI F former smoker followed for OSA, Insomnia, complicated by PVD, Hx DVT/ Eliquis, HTN, CAD/ MI, Carotid Stenosis/ L stent, Hypothyroid,  Multiple Pulmonary Nodules, Obesity, Covid infection June, 2020, hx Grave's Disease,  Office Spirometry 05/28/16- mild obstruction small airways. FEV1/FVC 0.74. HST 11/14/19- AHI 44.9/ hr, desaturation to 80%, body weight 263 lbs =================================================   01/23/20- 68 yoF former smoker followed for OSA, Insomnia, complicated by PVD, Hx DVT/ Eliquis, HTN, CAD/ MI, Carotid Stenosis/ L stent, Hypothyroid,  Multiple Pulmonary Nodules, Obesity, Covid infection June, 2020,  HST 11/14/19- AHI 44.9/ hr, desaturation to 80%, body weight 263 lbs Body weight today 258 lbs Covid vax- 2 Phizer Flu vax- had CPAP   [facility] Discussed use of previous CPAP from Dr [name]- using again. We need download from current machine. Will manage based on latest sleep study. Somatic bone and joint pains impact sleep.  Nasal congestion and drainage vary- partly seasonal.   10/29/20- 69 yoF former smoker followed for OSA, Insomnia, complicated by PVD, Hx DVT/ Eliquis, HTN, CAD/ MI, Carotid Stenosis/ L stent, Hypothyroid,  Multiple Pulmonary Nodules, Obesity, Covid infection June, 2020, DM2,  -Ambien 5 mg CPAP  / [facility]             originally from Dr [name] ? settings Download- Body weight today-250 lbs Covid vax-2 Phizer Not using CPAP. Sleepy in daytime. Wakes in AM still tired and sneezing/ dry nose.  Denies cough/ wheeze. She understands she will need update sleep study to requalify for CPAP.  Addendum for Pulmonary Clearance 12/26/20-  COPD- mild- FEV1/FVC 0.74 on 05/28/16 Obstructive sleep apnea moderate - AHI 20.1/ hr with desaturation to 78% on NPSG 12/13/20. Requalifies for CPAP and will recommend CPAP auto 5-15/ [facility]. She is at mild pulmonary risk from OSA and COPD for anticpated THR. Will need CPAP auto 5-20 in Recovery. Ok to proceed with planned  surgery from Pulmonary standpoint. Consultation will be available if needed.  Cardiac Risk assessment will need to be from Cardiology.    ROS-see HPI   + = positive Constitutional:    weight loss, night sweats, fevers, chills, +fatigue, lassitude. HEENT:    headaches, difficulty swallowing, tooth/dental problems, sore throat,       +sneezing, itching, ear ache, nasal congestion, post nasal drip, snoring CV:    chest pain, orthopnea, PND, swelling in lower extremities, anasarca,                                   dizziness, palpitations Resp:   shortness of breath with exertion or at rest.                productive cough,   non-productive cough, coughing up of blood.              change in color of mucus.  wheezing.   Skin:    rash or lesions. GI:  No-   heartburn, indigestion, abdominal pain, nausea, vomiting, diarrhea,                 change in bowel habits, loss of appetite GU: dysuria, change in color of urine, no urgency or frequency.   flank pain. MS:   +joint pain, stiffness, decreased range of motion, back pain. Neuro-     nothing unusual Psych:  change in mood or affect.  depression or anxiety.   memory loss.  OBJ- Physical Exam General- Alert, Oriented, Affect-appropriate, Distress- none acute, +  obese Skin- rash-none, lesions- none, excoriation- none Lymphadenopathy- none Head- atraumatic            Eyes- Gross vision intact, PERRLA, conjunctivae and secretions clear            Ears- Hearing, canals-normal            Nose- Clear, no-Septal dev, mucus, polyps, erosion, perforation             Throat- Mallampati IV , mucosa clear , drainage- none, tonsils- atrophic,  +much dental repair, Neck- flexible , trachea midline, no stridor , thyroid nl, carotid no bruit Chest - symmetrical excursion , unlabored           Heart/CV- RRR , no murmur , no gallop  , no rub, nl s1 s2                           - JVD- none , edema- none, stasis changes- none, varices- none           Lung-  clear to P&A, wheeze- none, cough- none , dullness-none, rub- none           Chest wall-  Abd-  Br/ Gen/ Rectal- Not done, not indicated Extrem- cyanosis- none, clubbing, none, atrophy- none, strength- nl Neuro- grossly intact to observation

## 2020-10-29 ENCOUNTER — Other Ambulatory Visit: Payer: Self-pay

## 2020-10-29 ENCOUNTER — Ambulatory Visit (INDEPENDENT_AMBULATORY_CARE_PROVIDER_SITE_OTHER): Payer: Medicare Other | Admitting: Internal Medicine

## 2020-10-29 ENCOUNTER — Ambulatory Visit (INDEPENDENT_AMBULATORY_CARE_PROVIDER_SITE_OTHER): Payer: Medicare Other

## 2020-10-29 ENCOUNTER — Encounter: Payer: Self-pay | Admitting: Internal Medicine

## 2020-10-29 VITALS — BP 136/78 | HR 65 | Temp 97.9°F | Ht 65.0 in | Wt 250.0 lb

## 2020-10-29 DIAGNOSIS — J449 Chronic obstructive pulmonary disease, unspecified: Secondary | ICD-10-CM | POA: Diagnosis not present

## 2020-10-29 DIAGNOSIS — R918 Other nonspecific abnormal finding of lung field: Secondary | ICD-10-CM

## 2020-10-29 DIAGNOSIS — G4733 Obstructive sleep apnea (adult) (pediatric): Secondary | ICD-10-CM | POA: Diagnosis not present

## 2020-10-29 NOTE — Patient Instructions (Signed)
Order- CXR    dx multiple lung nodules,  Covid pneumonia in 2020  Order- schedule Split night sleep study   dx OSA   Please call for results about 2 weeks after your sleep test

## 2020-10-30 NOTE — Telephone Encounter (Signed)
Lmtcb for pt.  

## 2020-10-31 ENCOUNTER — Ambulatory Visit (INDEPENDENT_AMBULATORY_CARE_PROVIDER_SITE_OTHER): Payer: Medicare Other | Admitting: Family Medicine

## 2020-11-01 ENCOUNTER — Encounter (HOSPITAL_COMMUNITY): Payer: Medicare Other

## 2020-11-01 ENCOUNTER — Ambulatory Visit: Payer: Medicare Other

## 2020-11-05 ENCOUNTER — Encounter (INDEPENDENT_AMBULATORY_CARE_PROVIDER_SITE_OTHER): Payer: Self-pay

## 2020-11-05 ENCOUNTER — Telehealth: Payer: Self-pay | Admitting: Internal Medicine

## 2020-11-05 NOTE — Telephone Encounter (Signed)
Called and spoke with Patient.  Patient requested cxr results from Dr Maple Hudson, 10/30/20.  Message routed to Dr. Maple Hudson to advise

## 2020-11-05 NOTE — Telephone Encounter (Signed)
CXR report- improved, with no new process

## 2020-11-06 ENCOUNTER — Encounter (INDEPENDENT_AMBULATORY_CARE_PROVIDER_SITE_OTHER): Payer: Self-pay | Admitting: Family Medicine

## 2020-11-06 ENCOUNTER — Ambulatory Visit (INDEPENDENT_AMBULATORY_CARE_PROVIDER_SITE_OTHER): Payer: Medicare Other | Admitting: Family Medicine

## 2020-11-06 ENCOUNTER — Other Ambulatory Visit: Payer: Self-pay

## 2020-11-06 VITALS — BP 122/83 | HR 60 | Temp 97.6°F | Ht 65.0 in | Wt 243.0 lb

## 2020-11-06 DIAGNOSIS — K5909 Other constipation: Secondary | ICD-10-CM

## 2020-11-06 DIAGNOSIS — R7989 Other specified abnormal findings of blood chemistry: Secondary | ICD-10-CM | POA: Diagnosis not present

## 2020-11-06 DIAGNOSIS — E559 Vitamin D deficiency, unspecified: Secondary | ICD-10-CM

## 2020-11-06 DIAGNOSIS — Z6841 Body Mass Index (BMI) 40.0 and over, adult: Secondary | ICD-10-CM | POA: Diagnosis not present

## 2020-11-06 DIAGNOSIS — R7303 Prediabetes: Secondary | ICD-10-CM

## 2020-11-06 MED ORDER — VITAMIN D (ERGOCALCIFEROL) 1.25 MG (50000 UNIT) PO CAPS: 50000.0000 [IU] | ORAL_CAPSULE | ORAL | 0 refills | Status: DC

## 2020-11-06 MED ORDER — SEMAGLUTIDE (1 MG/DOSE) 4 MG/3ML ~~LOC~~ SOPN: 1.0000 mg | PEN_INJECTOR | SUBCUTANEOUS | 0 refills | Status: DC

## 2020-11-06 MED ORDER — LINZESS 145 MCG PO CAPS: 145.0000 ug | ORAL_CAPSULE | Freq: Every day | ORAL | 0 refills | Status: DC

## 2020-11-06 NOTE — Telephone Encounter (Signed)
Called and spoke with patient, provided results per Dr. Young.  Patient verbalized understanding.  Nothing further needed.

## 2020-11-07 ENCOUNTER — Encounter (HOSPITAL_COMMUNITY): Payer: Medicare Other

## 2020-11-07 ENCOUNTER — Ambulatory Visit: Payer: Medicare Other

## 2020-11-07 NOTE — Progress Notes (Signed)
Chief Complaint:   OBESITY Caitlyn [name] is here to discuss her progress with her obesity treatment plan along with follow-up of her obesity related diagnoses. Caitlyn [name] is on the Category 2 Plan and states she is following her eating plan approximately 50% of the time. Caitlyn [name] states she is walking 30-45 minutes 3 times per week.  Today's visit was #: 9 Starting weight: 248 lbs Starting date: 05/03/2020 Today's weight: 243 lbs Today's date: 11/06/2020 Total lbs lost to date: 5 Total lbs lost since last in-office visit: 4  Interim History: Caitlyn [name] has had pain due to ortho problems, thus is skipping some meals. She denies hunger. Pt reports occasionally cravings for sweets but Ozempic helps. She has been eating more proteins. Journaling is not working for her. She is following category 2 plan.  Assessment/Plan:   Orders Placed This Encounter  Procedures   Hemoglobin A1c   Insulin, random   Comprehensive metabolic panel   VITAMIN D 25 Hydroxy (Vit-D Deficiency, Fractures)   T4, free   TSH    Medications Discontinued During This Encounter  Medication Reason   LINZESS 145 MCG CAPS capsule Reorder   Semaglutide, 1 MG/DOSE, 4 MG/3ML SOPN Reorder   Vitamin D, Ergocalciferol, (DRISDOL) 1.25 MG (50000 UNIT) CAPS capsule Reorder     Meds ordered this encounter  Medications   LINZESS 145 MCG CAPS capsule    Sig: Take 1 capsule (145 mcg total) by mouth daily.    Dispense:  30 capsule    Refill:  0   Semaglutide, 1 MG/DOSE, 4 MG/3ML SOPN    Sig: Inject 1 mg into the skin every 7 (seven) days. 1 mg wkly    Dispense:  3 mL    Refill:  0    Ov for RF   Vitamin D, Ergocalciferol, (DRISDOL) 1.25 MG (50000 UNIT) CAPS capsule    Sig: Take 1 capsule (50,000 Units total) by mouth every 7 (seven) days.    Dispense:  4 capsule    Refill:  0    Ov for RF     1. Pre-diabetes At goal. Goal is HgbA1c < 5.7.  Medication: Ozempic.    Plan:  She will continue to focus on protein-rich, low  simple carbohydrate foods. We reviewed the importance of hydration, regular exercise for stress reduction, and restorative sleep.   Lab Results  Component Value Date   HGBA1C 5.4 05/03/2020   Lab Results  Component Value Date   INSULIN 14.6 05/03/2020   INSULIN 27.7 (H) 01/13/2018   Refill- Semaglutide, 1 MG/DOSE, 4 MG/3ML SOPN; Inject 1 mg into the skin every 7 (seven) days. 1 mg wkly  Dispense: 3 mL; Refill: 0 - Hemoglobin A1c - Insulin, random  2. Chronic constipation Constipation improved with increased water intake and Linzess.   Plan: Increase water intake and continue Linzess.  Refill- LINZESS 145 MCG CAPS capsule; Take 1 capsule (145 mcg total) by mouth daily.  Dispense: 30 capsule; Refill: 0 - Comprehensive metabolic panel  3. Vitamin D deficiency Not at goal.   Plan: Continue to take prescription Vitamin D @50 ,000 IU every week as prescribed.  Follow-up for routine testing of Vitamin D, at least 2-3 times per year to avoid over-replacement.  Lab Results  Component Value Date   VD25OH 40.5 05/03/2020   VD25OH 29.4 (L) 01/13/2018   Refill- Vitamin D, Ergocalciferol, (DRISDOL) 1.25 MG (50000 UNIT) CAPS capsule; Take 1 capsule (50,000 Units total) by mouth every 7 (seven) days.  Dispense: 4 capsule;  Refill: 0 - VITAMIN D 25 Hydroxy (Vit-D Deficiency, Fractures)  4. Elevated serum free T4 level Asymptomatic with no concerns. Medication: Synthroid 125 mcg.   Lab Results  Component Value Date   TSH 0.716 05/03/2020   T3TOTAL 110 05/03/2020   T4TOTAL 11.8 07/01/2009   Plan: Recheck Thyroid panel one week prior to next OV.  - T4, free - TSH  5. Obesity with current BMI of 40.4  Caitlyn [name] is currently in the action stage of change. As such, her goal is to continue with weight loss efforts. She has agreed to the Category 2 Plan.   Exercise goals:  As is  Behavioral modification strategies: increasing lean protein intake, decreasing simple carbohydrates, and no  skipping meals.  Caitlyn [name] has agreed to follow-up with our clinic in 3 weeks- Come in 1 week prior for fasting blood work. She was informed of the importance of frequent follow-up visits to maximize her success with intensive lifestyle modifications for her multiple health conditions.   Caitlyn [name] was informed we would discuss her lab results at her next visit unless there is a critical issue that needs to be addressed sooner. Caitlyn [name] agreed to keep her next visit at the agreed upon time to discuss these results.  Objective:   Blood pressure 122/83, pulse 60, temperature 97.6 F (36.4 C), height 5\' 5"  (1.651 m), weight 243 lb (110.2 kg), SpO2 97 %. Body mass index is 40.44 kg/m.  General: Cooperative, alert, well developed, in no acute distress. HEENT: Conjunctivae and lids unremarkable. Cardiovascular: Regular rhythm.  Lungs: Normal work of breathing. Neurologic: No focal deficits.   Lab Results  Component Value Date   CREATININE 0.88 05/03/2020   BUN 21 05/03/2020   NA 142 05/03/2020   K 4.4 05/03/2020   CL 104 05/03/2020   CO2 18 (L) 05/03/2020   Lab Results  Component Value Date   ALT 11 05/03/2020   AST 19 05/03/2020   ALKPHOS 141 (H) 05/03/2020   BILITOT 0.2 05/03/2020   Lab Results  Component Value Date   HGBA1C 5.4 05/03/2020   HGBA1C 6.0 (H) 09/09/2018   HGBA1C 5.7 (H) 01/13/2018   Lab Results  Component Value Date   INSULIN 14.6 05/03/2020   INSULIN 27.7 (H) 01/13/2018   Lab Results  Component Value Date   TSH 0.716 05/03/2020   Lab Results  Component Value Date   CHOL 173 05/03/2020   HDL 58 05/03/2020   LDLCALC 98 05/03/2020   LDLDIRECT 156.4 08/02/2012   TRIG 96 05/03/2020   CHOLHDL 3.0 05/03/2020   Lab Results  Component Value Date   VD25OH 40.5 05/03/2020   VD25OH 29.4 (L) 01/13/2018   Lab Results  Component Value Date   WBC 5.6 05/03/2020   HGB 14.4 05/03/2020   HCT 44.6 05/03/2020   MCV 93 05/03/2020   PLT 232 05/03/2020   Lab  Results  Component Value Date   FERRITIN 351 (H) 09/12/2018    Obesity Behavioral Intervention:   Approximately 15 minutes were spent on the discussion below.  ASK: We discussed the diagnosis of obesity with 09/14/2018 today and Caitlyn [name] agreed to give Caitlyn [name] permission to discuss obesity behavioral modification therapy today.  ASSESS: Armentha has the diagnosis of obesity and her BMI today is 40.4. Caitlyn [name] is in the action stage of change.   ADVISE: Deshonda was educated on the multiple health risks of obesity as well as the benefit of weight loss to improve her health. She was advised of the need for  long term treatment and the importance of lifestyle modifications to improve her current health and to decrease her risk of future health problems.  AGREE: Multiple dietary modification options and treatment options were discussed and Kerstin agreed to follow the recommendations documented in the above note.  ARRANGE: Taya was educated on the importance of frequent visits to treat obesity as outlined per CMS and USPSTF guidelines and agreed to schedule her next follow up appointment today.  Attestation Statements:   Reviewed by clinician on day of visit: allergies, medications, problem list, medical history, surgical history, family history, social history, and previous encounter notes.  Edmund Hilda, CMA, am acting as transcriptionist for [employer], DO.  I have reviewed the above documentation for accuracy and completeness, and I agree with the above. Carlye Grippe, D.O.  The 21st Century Cures Act was signed into law in 2016 which includes the topic of electronic health records.  This provides immediate access to information in MyChart.  This includes consultation notes, operative notes, office notes, lab results and pathology reports.  If you have any questions about what you read please let us know at your next visit [name] we can discuss your concerns and take corrective  action if need be.  We are right here with you.

## 2020-11-09 NOTE — Telephone Encounter (Signed)
ATC patient,LMTCB. Per protocol after several attempts to contact patient will close this encounter. Nothing further needed at this time.

## 2020-11-22 LAB — COMPREHENSIVE METABOLIC PANEL
ALT: 13 IU/L (ref 0–32)
AST: 19 IU/L (ref 0–40)
Albumin/Globulin Ratio: 1.4 (ref 1.2–2.2)
Albumin: 4.1 g/dL (ref 3.8–4.8)
Alkaline Phosphatase: 125 IU/L — ABNORMAL HIGH (ref 44–121)
BUN/Creatinine Ratio: 27 (ref 12–28)
BUN: 25 mg/dL (ref 8–27)
Bilirubin Total: 0.3 mg/dL (ref 0.0–1.2)
CO2: 23 mmol/L (ref 20–29)
Calcium: 10.1 mg/dL (ref 8.7–10.3)
Chloride: 105 mmol/L (ref 96–106)
Creatinine, Ser: 0.91 mg/dL (ref 0.57–1.00)
Globulin, Total: 2.9 g/dL (ref 1.5–4.5)
Glucose: 81 mg/dL (ref 65–99)
Potassium: 4.6 mmol/L (ref 3.5–5.2)
Sodium: 141 mmol/L (ref 134–144)
Total Protein: 7 g/dL (ref 6.0–8.5)
eGFR: 69 mL/min/{1.73_m2} (ref >59)

## 2020-11-22 LAB — INSULIN, RANDOM: INSULIN: 13.8 u[IU]/mL (ref 2.6–24.9)

## 2020-11-22 LAB — HEMOGLOBIN A1C
Est. average glucose Bld gHb Est-mCnc: 111 mg/dL
Hgb A1c MFr Bld: 5.5 % (ref 4.8–5.6)

## 2020-11-22 LAB — VITAMIN D 25 HYDROXY (VIT D DEFICIENCY, FRACTURES): Vit D, 25-Hydroxy: 41.8 ng/mL (ref 30.0–100.0)

## 2020-11-22 LAB — T4, FREE: Free T4: 1.57 ng/dL (ref 0.82–1.77)

## 2020-11-22 LAB — TSH: TSH: 1.08 u[IU]/mL (ref 0.450–4.500)

## 2020-11-23 ENCOUNTER — Inpatient Hospital Stay (HOSPITAL_COMMUNITY): Admission: RE | Admit: 2020-11-23 | Payer: Medicare Other | Source: Ambulatory Visit

## 2020-11-23 ENCOUNTER — Encounter (HOSPITAL_COMMUNITY): Payer: Medicare Other

## 2020-11-23 ENCOUNTER — Ambulatory Visit: Payer: Medicare Other

## 2020-11-29 ENCOUNTER — Ambulatory Visit (INDEPENDENT_AMBULATORY_CARE_PROVIDER_SITE_OTHER): Payer: Medicare Other | Admitting: Family Medicine

## 2020-11-29 ENCOUNTER — Other Ambulatory Visit: Payer: Self-pay

## 2020-11-29 ENCOUNTER — Encounter (INDEPENDENT_AMBULATORY_CARE_PROVIDER_SITE_OTHER): Payer: Self-pay | Admitting: Family Medicine

## 2020-11-29 DIAGNOSIS — R7303 Prediabetes: Secondary | ICD-10-CM | POA: Diagnosis not present

## 2020-11-29 DIAGNOSIS — E559 Vitamin D deficiency, unspecified: Secondary | ICD-10-CM

## 2020-11-29 DIAGNOSIS — J3089 Other allergic rhinitis: Secondary | ICD-10-CM

## 2020-11-29 DIAGNOSIS — K5909 Other constipation: Secondary | ICD-10-CM | POA: Diagnosis not present

## 2020-11-29 DIAGNOSIS — Z6841 Body Mass Index (BMI) 40.0 and over, adult: Secondary | ICD-10-CM | POA: Diagnosis not present

## 2020-11-29 MED ORDER — LINZESS 145 MCG PO CAPS: 145.0000 ug | ORAL_CAPSULE | Freq: Every day | ORAL | 0 refills | Status: DC

## 2020-11-29 MED ORDER — VITAMIN D (ERGOCALCIFEROL) 1.25 MG (50000 UNIT) PO CAPS: ORAL_CAPSULE | ORAL | 0 refills | Status: AC

## 2020-11-29 MED ORDER — LEVOCETIRIZINE DIHYDROCHLORIDE 5 MG PO TABS: 5.0000 mg | ORAL_TABLET | Freq: Every evening | ORAL | 0 refills | Status: DC

## 2020-11-29 MED ORDER — SEMAGLUTIDE (1 MG/DOSE) 4 MG/3ML ~~LOC~~ SOPN: 1.0000 mg | PEN_INJECTOR | SUBCUTANEOUS | 0 refills | Status: DC

## 2020-12-03 ENCOUNTER — Other Ambulatory Visit (INDEPENDENT_AMBULATORY_CARE_PROVIDER_SITE_OTHER): Payer: Self-pay | Admitting: Family Medicine

## 2020-12-03 DIAGNOSIS — K5909 Other constipation: Secondary | ICD-10-CM

## 2020-12-03 NOTE — Progress Notes (Signed)
Chief Complaint:   OBESITY Caitlyn [name] is here to discuss her progress with her obesity treatment plan along with follow-up of her obesity related diagnoses. Caitlyn [name] is on the Category 2 Plan and states she is following her eating plan approximately 50% of the time. Caitlyn [name] states she is walking for 45-60 minutes 1-2 times per week.  Today's visit was #: 10 Starting weight: 248 lbs Starting date: 05/03/2020 Today's weight: 243 lbs Today's date: 11/29/2020 Total lbs lost to date: 5 Total lbs lost since last in-office visit: 0  Interim History: Caitlyn [name] has been in a lot of pain with her left hip. She is unable to eat all of the food on the plan at times. No issues otherwise.  Subjective:   1. Pre-diabetes Caitlyn [name] has a diagnosis of prediabetes based on her elevated HgA1c and was informed this puts her at greater risk of developing diabetes. She continues to work on diet and exercise to decrease her risk of diabetes. She denies nausea or hypoglycemia.  2. Vitamin D deficiency Caitlyn [name] is currently taking OTC vitamin D3 2,000 IU each day and prescription vitamin D. She denies nausea, vomiting or muscle weakness. She has good medication compliance.  3. Chronic constipation Caitlyn [name] is using Linzess q daily, and it works great. She notes she has a BM q daily. She is drinking 84 OZ of water per day.  4. Environmental and seasonal allergies Caitlyn [name] has seasonal allergies that have been more bothersome with the change of weather/season.    Assessment/Plan:  No orders of the defined types were placed in this encounter.   Medications Discontinued During This Encounter  Medication Reason   LINZESS 145 MCG CAPS capsule Reorder   Semaglutide, 1 MG/DOSE, 4 MG/3ML SOPN Reorder   Vitamin D, Ergocalciferol, (DRISDOL) 1.25 MG (50000 UNIT) CAPS capsule Reorder     Meds ordered this encounter  Medications   LINZESS 145 MCG CAPS capsule    Sig: Take 1 capsule (145 mcg total) by mouth  daily.    Dispense:  30 capsule    Refill:  0   Semaglutide, 1 MG/DOSE, 4 MG/3ML SOPN    Sig: Inject 1 mg into the skin every 7 (seven) days. 1 mg wkly    Dispense:  3 mL    Refill:  0    Ov for RF   Vitamin D, Ergocalciferol, (DRISDOL) 1.25 MG (50000 UNIT) CAPS capsule    Sig: 1 PO Q 5 DAYS    Dispense:  6 capsule    Refill:  0    Ov for RF   levocetirizine (XYZAL) 5 MG tablet    Sig: Take 1 tablet (5 mg total) by mouth every evening.    Dispense:  30 tablet    Refill:  0     1. Pre-diabetes Caitlyn [name] will continue to work on weight loss, exercise, and decreasing simple carbohydrates to help decrease the risk of diabetes. We will refill Ozempic for 1 month.  - Semaglutide, 1 MG/DOSE, 4 MG/3ML SOPN; Inject 1 mg into the skin every 7 (seven) days. 1 mg wkly  Dispense: 3 mL; Refill: 0  2. Vitamin D deficiency Low Vitamin D level contributes to fatigue and are associated with obesity, breast, and colon cancer. We will refill prescription Vitamin D 50,000 IU every 5 days for 1 month. Caitlyn [name] will follow-up for routine testing of Vitamin D, at least 2-3 times per year to avoid over-replacement.  - Vitamin D, Ergocalciferol, (DRISDOL) 1.25 MG (50000 UNIT)  CAPS capsule; 1 PO Q 5 DAYS  Dispense: 6 capsule; Refill: 0  3. Chronic constipation Caitlyn [name] was informed that a decrease in bowel movement frequency is normal while losing weight, but stools should not be hard or painful. We will refill Linzess for 1 month. Orders and follow up as documented in patient record.   Counseling Getting to Good Bowel Health: Your goal is to have one soft bowel movement each day. Drink at least 8 glasses of water each day. Eat plenty of fiber (goal is over 25 grams each day). It is best to get most of your fiber from dietary sources which includes leafy green vegetables, fresh fruit, and whole grains. You may need to add fiber with the help of OTC fiber supplements. These include Metamucil, Citrucel, and  Flaxseed. If you are still having trouble, try adding Miralax or Magnesium Citrate. If all of these changes do not work, Dietitian.  - LINZESS 145 MCG CAPS capsule; Take 1 capsule (145 mcg total) by mouth daily.  Dispense: 30 capsule; Refill: 0  4. Environmental and seasonal allergies Caitlyn [name] agreed to start Xyzal 5 mg qhs with no refills. We will follow up at her next office visit.  - Seasonal and environmental allergies and pathophysiology of disease process discussed with patient.  - Preventative strategies as first line for management discussed.  I encouraged use of KN or N-95 mask use as well as sterile saline rinses such as Caitlyn [name] Med or AYR sinus rinses to be done twice daily and after any prolonged exposure to the environment or allergen.    It is best to use distilled water or previously boiled water    - If eyes are itchy or irritated feeling when your seasonal allergies get bad, ok to use Naphcon-A over-the-counter eyedrops as needed - If continues to worsen despite preventative strategies, take over-the-counter meds such as Allegra, Xyzal etc daily during allergy seasons or nasal steroids such as Flonase, Rhinocort and the like - We can consider Astelin nasal spray, if symptoms are not well controlled with OTC medications, nasal rinses and preventative strategies. - Encouraged to return to clinic or call the office today discussed further questions or concerns they may have.  - levocetirizine (XYZAL) 5 MG tablet; Take 1 tablet (5 mg total) by mouth every evening.  Dispense: 30 tablet; Refill: 0  5. Obesity with current BMI of 40.4 Caitlyn [name] is currently in the action stage of change. As such, her goal is to continue with weight loss efforts. She has agreed to the Category 2 Plan.   Exercise goals: As is.  Behavioral modification strategies: increasing lean protein intake and no skipping meals.  Caitlyn [name] has agreed to follow-up with our clinic in 3 weeks. She was informed of  the importance of frequent follow-up visits to maximize her success with intensive lifestyle modifications for her multiple health conditions.   Objective:   Blood pressure 124/83, pulse 63, temperature 97.9 F (36.6 C), height 5\' 5"  (1.651 m), weight 243 lb (110.2 kg), SpO2 99 %. Body mass index is 40.44 kg/m.  General: Cooperative, alert, well developed, in no acute distress. HEENT: Conjunctivae and lids unremarkable. Cardiovascular: Regular rhythm.  Lungs: Normal work of breathing. Neurologic: No focal deficits.   Lab Results  Component Value Date   CREATININE 0.91 11/21/2020   BUN 25 11/21/2020   NA 141 11/21/2020   K 4.6 11/21/2020   CL 105 11/21/2020   CO2 23 11/21/2020   Lab Results  Component Value  Date   ALT 13 11/21/2020   AST 19 11/21/2020   ALKPHOS 125 (H) 11/21/2020   BILITOT 0.3 11/21/2020   Lab Results  Component Value Date   HGBA1C 5.5 11/21/2020   HGBA1C 5.4 05/03/2020   HGBA1C 6.0 (H) 09/09/2018   HGBA1C 5.7 (H) 01/13/2018   Lab Results  Component Value Date   INSULIN 13.8 11/21/2020   INSULIN 14.6 05/03/2020   INSULIN 27.7 (H) 01/13/2018   Lab Results  Component Value Date   TSH 1.080 11/21/2020   Lab Results  Component Value Date   CHOL 173 05/03/2020   HDL 58 05/03/2020   LDLCALC 98 05/03/2020   LDLDIRECT 156.4 08/02/2012   TRIG 96 05/03/2020   CHOLHDL 3.0 05/03/2020   Lab Results  Component Value Date   VD25OH 41.8 11/21/2020   VD25OH 40.5 05/03/2020   VD25OH 29.4 (L) 01/13/2018   Lab Results  Component Value Date   WBC 5.6 05/03/2020   HGB 14.4 05/03/2020   HCT 44.6 05/03/2020   MCV 93 05/03/2020   PLT 232 05/03/2020   Lab Results  Component Value Date   FERRITIN 351 (H) 09/12/2018    Obesity Behavioral Intervention:   Approximately 15 minutes were spent on the discussion below.  ASK: We discussed the diagnosis of obesity with [name] today and Paysley agreed to give [place] permission to discuss obesity behavioral  modification therapy today.  ASSESS: Katrice has the diagnosis of obesity and her BMI today is 40.4. Laylana is in the action stage of change.   ADVISE: Cristela was educated on the multiple health risks of obesity as well as the benefit of weight loss to improve her health. She was advised of the need for long term treatment and the importance of lifestyle modifications to improve her current health and to decrease her risk of future health problems.  AGREE: Multiple dietary modification options and treatment options were discussed and Zareya agreed to follow the recommendations documented in the above note.  ARRANGE: Lamija was educated on the importance of frequent visits to treat obesity as outlined per CMS and USPSTF guidelines and agreed to schedule her next follow up appointment today.  Attestation Statements:   Reviewed by clinician on day of visit: allergies, medications, problem list, medical history, surgical history, family history, social history, and previous encounter notes.   Trude Mcburney, am acting as transcriptionist for [employer], DO.  I have reviewed the above documentation for accuracy and completeness, and I agree with the above. Carlye Grippe, D.O.  The 21st Century Cures Act was signed into law in 2016 which includes the topic of electronic health records.  This provides immediate access to information in MyChart.  This includes consultation notes, operative notes, office notes, lab results and pathology reports.  If you have any questions about what you read please let us know at your next visit so we can discuss your concerns and take corrective action if need be.  We are right here with you.

## 2020-12-08 ENCOUNTER — Ambulatory Visit (INDEPENDENT_AMBULATORY_CARE_PROVIDER_SITE_OTHER): Payer: Medicare Other

## 2020-12-08 DIAGNOSIS — Z0000 Encounter for general adult medical examination without abnormal findings: Secondary | ICD-10-CM

## 2020-12-08 NOTE — Patient Instructions (Signed)
Health Maintenance, Female Adopting a healthy lifestyle and getting preventive care are important in promoting health and wellness. Ask your health care provider about: The right schedule for you to have regular tests and exams. Things you can do on your own to prevent diseases and keep yourself healthy. What should I know about diet, weight, and exercise? Eat a healthy diet  Eat a diet that includes plenty of vegetables, fruits, low-fat dairy products, and lean protein. Do not eat a lot of foods that are high in solid fats, added sugars, or sodium. Maintain a healthy weight Body mass index (BMI) is used to identify weight problems. It estimates body fat based on height and weight. Your health care provider can help determine your BMI and help you achieve or maintain a healthy weight. Get regular exercise Get regular exercise. This is one of the most important things you can do for your health. Most adults should: Exercise for at least 150 minutes each week. The exercise should increase your heart rate and make you sweat (moderate-intensity exercise). Do strengthening exercises at least twice a week. This is in addition to the moderate-intensity exercise. Spend less time sitting. Even light physical activity can be beneficial. Watch cholesterol and blood lipids Have your blood tested for lipids and cholesterol at 68 years of age, then have this test every 5 years. Have your cholesterol levels checked more often if: Your lipid or cholesterol levels are high. You are older than 68 years of age. You are at high risk for heart disease. What should I know about cancer screening? Depending on your health history and family history, you may need to have cancer screening at various ages. This may include screening for: Breast cancer. Cervical cancer. Colorectal cancer. Skin cancer. Lung cancer. What should I know about heart disease, diabetes, and high blood pressure? Blood pressure and heart  disease High blood pressure causes heart disease and increases the risk of stroke. This is more likely to develop in people who have high blood pressure readings, are of African descent, or are overweight. Have your blood pressure checked: Every 3-5 years if you are 18-39 years of age. Every year if you are 40 years old or older. Diabetes Have regular diabetes screenings. This checks your fasting blood sugar level. Have the screening done: Once every three years after age 40 if you are at a normal weight and have a low risk for diabetes. More often and at a younger age if you are overweight or have a high risk for diabetes. What should I know about preventing infection? Hepatitis B If you have a higher risk for hepatitis B, you should be screened for this virus. Talk with your health care provider to find out if you are at risk for hepatitis B infection. Hepatitis C Testing is recommended for: Everyone born from 1945 through 1965. Anyone with known risk factors for hepatitis C. Sexually transmitted infections (STIs) Get screened for STIs, including gonorrhea and chlamydia, if: You are sexually active and are younger than 68 years of age. You are older than 68 years of age and your health care provider tells you that you are at risk for this type of infection. Your sexual activity has changed since you were last screened, and you are at increased risk for chlamydia or gonorrhea. Ask your health care provider if you are at risk. Ask your health care provider about whether you are at high risk for HIV. Your health care provider may recommend a prescription medicine   to help prevent HIV infection. If you choose to take medicine to prevent HIV, you should first get tested for HIV. You should then be tested every 3 months for as long as you are taking the medicine. Pregnancy If you are about to stop having your period (premenopausal) and you may become pregnant, seek counseling before you get  pregnant. Take 400 to 800 micrograms (mcg) of folic acid every day if you become pregnant. Ask for birth control (contraception) if you want to prevent pregnancy. Osteoporosis and menopause Osteoporosis is a disease in which the bones lose minerals and strength with aging. This can result in bone fractures. If you are 65 years old or older, or if you are at risk for osteoporosis and fractures, ask your health care provider if you should: Be screened for bone loss. Take a calcium or vitamin D supplement to lower your risk of fractures. Be given hormone replacement therapy (HRT) to treat symptoms of menopause. Follow these instructions at home: Lifestyle Do not use any products that contain nicotine or tobacco, such as cigarettes, e-cigarettes, and chewing tobacco. If you need help quitting, ask your health care provider. Do not use street drugs. Do not share needles. Ask your health care provider for help if you need support or information about quitting drugs. Alcohol use Do not drink alcohol if: Your health care provider tells you not to drink. You are pregnant, may be pregnant, or are planning to become pregnant. If you drink alcohol: Limit how much you use to 0-1 drink a day. Limit intake if you are breastfeeding. Be aware of how much alcohol is in your drink. In the [place] one drink equals one 12 oz bottle of beer (355 mL), one 5 oz glass of wine (148 mL), or one 1 oz glass of hard liquor (44 mL). General instructions Schedule regular health, dental, and eye exams. Stay current with your vaccines. Tell your health care provider if: You often feel depressed. You have ever been abused or do not feel safe at home. Summary Adopting a healthy lifestyle and getting preventive care are important in promoting health and wellness. Follow your health care provider's instructions about healthy diet, exercising, and getting tested or screened for diseases. Follow your health care provider's  instructions on monitoring your cholesterol and blood pressure. This information is not intended to replace advice given to you by your health care provider. Make sure you discuss any questions you have with your health care provider. Document Revised: 05/11/2020 Document Reviewed: 02/24/2018 Elsevier Patient Education  2022 Elsevier Inc.  

## 2020-12-08 NOTE — Progress Notes (Signed)
Subjective:   Caitlyn [name] is a 70 y.o. female who presents for an Initial Medicare Annual Wellness Visit.  Review of Systems    I connected with  SHAMERE DILWORTH on 12/08/20 by an audio only telemedicine application and verified that I am speaking with the correct Caitlyn [name] using two identifiers.   I discussed the limitations, risks, security and privacy concerns of performing an evaluation and management service by telephone and the availability of in [facility] appointments. I also discussed with the patient that there may be a patient responsible charge related to this service. The patient expressed understanding and verbally consented to this telephonic visit.  Location of Patient: Home  Location of Provider: Office  List any persons and their role that are participating in the visit with the patient.  Sammuel Hines and Mariann Barter [name] Salway, CMA       Objective:    Today's Vitals   12/08/20 1122  PainSc: 6    There is no height or weight on file to calculate BMI.  Advanced Directives 09/08/2018 08/31/2018 08/06/2018 01/26/2018 10/15/2017 01/06/2016 01/06/2016  Does Patient Have a Medical Advance Directive? No No No No No No No  Would patient like information on creating a medical advance directive? No - Patient declined No - Patient declined No - Patient declined - - No - patient declined information -    Current Medications (verified) Outpatient Encounter Medications as of 12/08/2020  Medication Sig   allopurinol (ZYLOPRIM) 300 MG tablet Take 300 mg by mouth daily.   carvedilol (COREG) 6.25 MG tablet Take 1 tablet (6.25 mg total) by mouth 2 (two) times daily with a meal.   Cholecalciferol (VITAMIN D) 50 MCG (2000 UT) tablet Take 1 tablet (2,000 Units total) by mouth daily.   clopidogrel (PLAVIX) 75 MG tablet Take 75 mg by mouth daily.    colchicine 0.6 MG tablet Take 0.6 mg by mouth daily as needed (for flare ups). Flare ups   furosemide (LASIX) 40 MG tablet Take 40 mg by mouth  daily.   levothyroxine (SYNTHROID, LEVOTHROID) 125 MCG tablet Take 125 mcg by mouth daily before breakfast.   LINZESS 145 MCG CAPS capsule Take 1 capsule (145 mcg total) by mouth daily.   losartan (COZAAR) 50 MG tablet Take 1 tablet (50 mg total) by mouth daily.   nitroGLYCERIN (NITROSTAT) 0.4 MG SL tablet    potassium chloride (KLOR-CON) 10 MEQ tablet Take 1 tablet (10 mEq total) by mouth daily as needed. Taking 1 tablet when taking Lasix   rosuvastatin (CRESTOR) 10 MG tablet One tab po every other night b-4 bed   Semaglutide, 1 MG/DOSE, 4 MG/3ML SOPN Inject 1 mg into the skin every 7 (seven) days. 1 mg wkly   Vitamin D, Ergocalciferol, (DRISDOL) 1.25 MG (50000 UNIT) CAPS capsule 1 PO Q 5 DAYS   zinc gluconate 50 MG tablet Take 50 mg by mouth daily.   [DISCONTINUED] bumetanide (BUMEX) 1 MG tablet Take 1 mg by mouth daily.   [DISCONTINUED] zolpidem (AMBIEN) 5 MG tablet Take 1 tablet (5 mg total) by mouth at bedtime as needed for sleep.   levocetirizine (XYZAL) 5 MG tablet Take 1 tablet (5 mg total) by mouth every evening. (Patient not taking: Reported on 12/08/2020)   No facility-administered encounter medications on file as of 12/08/2020.    Allergies (verified) Amlodipine and Statins   History: Past Medical History:  Diagnosis Date   Anxiety    Arthritis    "legs" (01/07/2016)   Back  pain    Back pain    CAD 06/2009   a.  s/p MI 4/11: tx with DES x 2 to RCA;  b.  LHC 03/17/11: LAD 30-40%, distal LAD 30-40%, OM2 40-50%, RCA stent patent, EF greater than 70%.;  c.  Lexiscan Myoview (12/15):  Mild apical thinning, no ischemia, EF 61%; normal study   CAROTID STENOSIS    carotid Dopplers 03/25/11: RICA 60-79%; LICA 0-39%.  Follow up recommended in 6 months.   Constipation    Constipation    COPD (chronic obstructive pulmonary disease) (HCC)    Depression    Gout    "on RX for it" (01/07/2016)   Heart disease    History of heart attack    Hx of blood clots    HYPERLIPIDEMIA     HYPERTENSION    HYPOTHYROIDISM    post ablation tx Graves   Hypothyroidism    Joint pain    Myocardial infarction [facility]) 2012   "mini"   OSA on CPAP    Osteoarthritis    Pre-diabetes    PVD    Rheumatoid arthritis (HCC)    Sleep apnea    SOBOE (shortness of breath on exertion)    TOBACCO ABUSE quit 06/2009   Varicose veins    VITAMIN D DEFICIENCY    DEXA 04/2009 normal   Past Surgical History:  Procedure Laterality Date   CARDIAC CATHETERIZATION N/A 01/07/2016   Procedure: Left Heart Cath and Coronary Angiography;  Surgeon: Rinaldo Cloud, MD;  [facility] INVASIVE CV LAB;  Service: Cardiovascular;  Laterality: N/A;   CARDIAC CATHETERIZATION N/A 01/07/2016   Procedure: Coronary Stent Intervention;  Surgeon: Rinaldo Cloud, MD;  Location: [facility] INVASIVE CV LAB;  Service: Cardiovascular;  Laterality: N/A;   CAROTID STENT Left    bilateral carotid artery disease post stent of left carotid   CESAREAN SECTION  1985   "twins"   CORONARY ANGIOPLASTY     LEFT HEART CATHETERIZATION WITH CORONARY ANGIOGRAM N/A 03/17/2011   Procedure: LEFT HEART CATHETERIZATION WITH CORONARY ANGIOGRAM;  Surgeon: Herby Abraham, MD;  Location: [facility] CATH LAB;  Service: Cardiovascular;  Laterality: N/A;   PERIPHERAL VASCULAR CATHETERIZATION N/A 04/06/2015   Procedure: Abdominal Aortogram;  Surgeon: Sherren Kerns, MD;  Location: [facility] INVASIVE CV LAB;  Service: Cardiovascular;  Laterality: N/A;   RADIOACTIVE PLAQUE INSERTION     Graves disease post radioactive treatment complicated hypothyroidism   Family History  Problem Relation Age of Onset   Heart attack Mother    Stroke Mother    Cancer Mother    Varicose Veins Mother    Thyroid disease Mother    Hypertension Mother    Heart disease Mother    Sudden death Mother    Depression Mother    Cancer Father    Heart disease Father    Hypertension Father    Coronary artery disease Other    Hypertension Other    Hypertension Sister    Breast cancer Paternal  Aunt    Social History   Socioeconomic History   Marital status: Divorced    Spouse name: Not on file   Number of children: Not on file   Years of education: Not on file   Highest education level: Not on file  Occupational History   Occupation: retired, Comptroller for mental health  Tobacco Use   Smoking status: Former    Packs/day: 1.00    Years: 35.00    Pack years: 35.00    Types: Cigarettes  Quit date: 06/30/2009    Years since quitting: 11.4   Smokeless tobacco: Never  Vaping Use   Vaping Use: Never used  Substance and Sexual Activity   Alcohol use: Yes    Alcohol/week: 2.0 standard drinks    Types: 2 Glasses of wine per week    Comment: occ   Drug use: No   Sexual activity: Not Currently    Comment: Quit smoking after MI- prev 1ppd x 35ys  Other Topics Concern   Not on file  Social History Narrative   Not on file   Social Determinants of Health   Financial Resource Strain: Not on file  Food Insecurity: Not on file  Transportation Needs: Not on file  Physical Activity: Not on file  Stress: Not on file  Social Connections: Not on file    Tobacco Counseling Counseling given: Not Answered   Clinical Intake:  Pre-visit preparation completed: Yes  Pain : 0-10 Pain Score: 6  Pain Type: Chronic pain Pain Location: Hip Pain Orientation: Left Pain Descriptors / Indicators: Constant Pain Onset: More than a month ago Pain Frequency: Constant     Nutritional Risks: None Diabetes: No     Diabetic?no  Interpreter Needed?: No      Activities of Daily Living In your present state of health, do you have any difficulty performing the following activities: 12/08/2020  Hearing? N  Vision? N  Difficulty concentrating or making decisions? N  Walking or climbing stairs? Y  Dressing or bathing? Y  Doing errands, shopping? N  Some recent data might be hidden    Patient Care Team: Overton Mam, DO as PCP - General (Family Medicine) Rinaldo Cloud, MD as Consulting Physician (Cardiology)  Indicate any recent Medical Services you may have received from other than [facility] providers in the past year (date may be approximate).     Assessment:   This is a routine wellness examination for Caitlyn [name].  Hearing/Vision screen No results found.  Dietary issues and exercise activities discussed:     Goals Addressed   None   Depression Screen PHQ 2/9 Scores 12/08/2020 05/03/2020 08/06/2018 01/13/2018  PHQ - 2 Score 0 2 2 4   PHQ- 9 Score 4 11 4 12   Exception Documentation - - - Medical reason    Fall Risk Fall Risk  12/08/2020  Falls in the past year? 0  Number falls in past yr: 0  Injury with Fall? 1  Comment hip  Risk for fall due to : History of fall(s)  Follow up Falls evaluation completed    FALL RISK PREVENTION PERTAINING TO THE HOME:  Any stairs in or around the home? Yes  If so, are there any without handrails? No  Home free of loose throw rugs in walkways, pet beds, electrical cords, etc? Yes  Adequate lighting in your home to reduce risk of falls? Yes   ASSISTIVE DEVICES UTILIZED TO PREVENT FALLS:  Life alert? No  Use of a cane, walker or w/c? No  Grab bars in the bathroom? No  Shower chair or bench in shower? No  Elevated toilet seat or a handicapped toilet? No   TIMED UP AND GO:  Was the test performed? No .  Length of time to ambulate 10 feet: n/a sec.     Cognitive Function:     6CIT Screen 12/08/2020  What Year? 0 points  What month? 0 points  What time? 0 points  Count back from 20 0 points  Months in reverse 0  points  Repeat phrase 0 points  Total Score 0    Immunizations Immunization History  Administered Date(s) Administered   Influenza Split 03/24/2016, 01/07/2017   Influenza-Unspecified 01/02/2020   PFIZER(Purple Top)SARS-COV-2 Vaccination 12/06/2019, 01/23/2020, 01/23/2020   Pneumococcal Conjugate-13 01/24/2016   Pneumococcal-Unspecified 01/24/2016   Tdap 04/11/2016    TDAP  status: Up to date  Flu Vaccine status: Due, Education has been provided regarding the importance of this vaccine. Advised may receive this vaccine at local pharmacy or Health Dept. Aware to provide a copy of the vaccination record if obtained from local pharmacy or Health Dept. Verbalized acceptance and understanding.  Pneumococcal vaccine status: Due, Education has been provided regarding the importance of this vaccine. Advised may receive this vaccine at local pharmacy or Health Dept. Aware to provide a copy of the vaccination record if obtained from local pharmacy or Health Dept. Verbalized acceptance and understanding.  Covid-19 vaccine status: Completed vaccines  Qualifies for Shingles Vaccine? Yes   Zostavax completed No   Shingrix Completed?: No.    Education has been provided regarding the importance of this vaccine. Patient has been advised to call insurance company to determine out of pocket expense if they have not yet received this vaccine. Advised may also receive vaccine at local pharmacy or Health Dept. Verbalized acceptance and understanding.  Screening Tests Health Maintenance  Topic Date Due   Hepatitis C Screening  Never done   Zoster Vaccines- Shingrix (1 of 2) Never done   COVID-19 Vaccine (3 - Pfizer risk series) 02/20/2020   INFLUENZA VACCINE  10/15/2020   MAMMOGRAM  03/05/2022   TETANUS/TDAP  04/11/2026   COLONOSCOPY (Pts 45-6yrs Insurance coverage will need to be confirmed)  06/13/2027   DEXA SCAN  Completed   HPV VACCINES  Aged Out    Health Maintenance  Health Maintenance Due  Topic Date Due   Hepatitis C Screening  Never done   Zoster Vaccines- Shingrix (1 of 2) Never done   COVID-19 Vaccine (3 - Pfizer risk series) 02/20/2020   INFLUENZA VACCINE  10/15/2020    Colorectal cancer screening: Type of screening: Colonoscopy. Completed 06/12/2017. Repeat every 10 years  Mammogram status: Completed 03/05/2020. Repeat every year 2  Bone Density status:  Completed 03/03/2014. Results reflect: Bone density results: OSTEOPENIA. Repeat every n/a years.  Lung Cancer Screening: (Low Dose CT Chest recommended if Age 39-80 years, 30 pack-year currently smoking OR have quit w/in 15years.) does not qualify.   Lung Cancer Screening Referral: n/a  Additional Screening:  Hepatitis C Screening: does not qualify; Not Completed   Vision Screening: Recommended annual ophthalmology exams for early detection of glaucoma and other disorders of the eye. Is the patient up to date with their annual eye exam?  No  Who is the provider or what is the name of the office in which the patient attends annual eye exams? [facility] If pt is not established with a provider, would they like to be referred to a provider to establish care? No .   Dental Screening: Recommended annual dental exams for proper oral hygiene  Community Resource Referral / Chronic Care Management: CRR required this visit?  No   CCM required this visit?  No      Plan:     I have personally reviewed and noted the following in the patient's chart:   Medical and social history Use of alcohol, tobacco or illicit drugs  Current medications and supplements including opioid prescriptions. Patient is not currently taking opioid prescriptions. Functional  ability and status Nutritional status Physical activity Advanced directives List of other physicians Hospitalizations, surgeries, and ER visits in previous 12 months Vitals Screenings to include cognitive, depression, and falls Referrals and appointments  In addition, I have reviewed and discussed with patient certain preventive protocols, quality metrics, and best practice recommendations. A written personalized care plan for preventive services as well as general preventive health recommendations were provided to patient.     Eulis Canner Adriene Knipfer, CMA   12/08/2020   Nurse Notes: Non face to Face 40 min visit   Ms. Caitlyn [name] , Thank you  for taking time to come for your Medicare Wellness Visit. I appreciate your ongoing commitment to your health goals. Please review the following plan we discussed and let me know if I can assist you in the future.   These are the goals we discussed:  Goals   None     This is a list of the screening recommended for you and due dates:  Health Maintenance  Topic Date Due   Hepatitis C Screening: USPSTF Recommendation to screen - Ages 5-79 yo.  Never done   Zoster (Shingles) Vaccine (1 of 2) Never done   COVID-19 Vaccine (3 - Pfizer risk series) 02/20/2020   Flu Shot  10/15/2020   Mammogram  03/05/2022   Tetanus Vaccine  04/11/2026   Colon Cancer Screening  06/13/2027   DEXA scan (bone density measurement)  Completed   HPV Vaccine  Aged Out

## 2020-12-13 ENCOUNTER — Encounter (HOSPITAL_COMMUNITY): Payer: Medicare Other

## 2020-12-13 ENCOUNTER — Other Ambulatory Visit: Payer: Self-pay

## 2020-12-13 ENCOUNTER — Ambulatory Visit (HOSPITAL_BASED_OUTPATIENT_CLINIC_OR_DEPARTMENT_OTHER): Payer: Medicare Other | Attending: Internal Medicine | Admitting: Internal Medicine

## 2020-12-13 ENCOUNTER — Ambulatory Visit (HOSPITAL_COMMUNITY): Payer: Medicare Other

## 2020-12-13 DIAGNOSIS — I493 Ventricular premature depolarization: Secondary | ICD-10-CM | POA: Insufficient documentation

## 2020-12-13 DIAGNOSIS — G4733 Obstructive sleep apnea (adult) (pediatric): Secondary | ICD-10-CM | POA: Diagnosis not present

## 2020-12-16 DIAGNOSIS — G4733 Obstructive sleep apnea (adult) (pediatric): Secondary | ICD-10-CM | POA: Diagnosis not present

## 2020-12-16 NOTE — Procedures (Signed)
   Patient Name: Caitlyn [name], Caitlyn [name] Date: 12/13/2020 Gender: Female D.O.B: 03-12-1951 Age (years): 79 Referring Provider: Jetty Duhamel MD, ABSM Height (inches): 65 Interpreting Physician: Jetty Duhamel MD, ABSM Weight (lbs): 240 RPSGT: Cherylann Parr BMI: 40 MRN: [id number] Neck Size: 15.00 <br> <br> CLINICAL INFORMATION Sleep Study Type: NPSG Indication for sleep study: Obesity, Snoring, Witnesses Apnea / Gasping During Sleep Epworth Sleepiness Score: 1  SLEEP STUDY TECHNIQUE As per the AASM Manual for the Scoring of Sleep and Associated Events v2.3 (April 2016) with a hypopnea requiring 4% desaturations.  The channels recorded and monitored were frontal, central and occipital EEG, electrooculogram (EOG), submentalis EMG (chin), nasal and oral airflow, thoracic and abdominal wall motion, anterior tibialis EMG, snore microphone, electrocardiogram, and pulse oximetry.  MEDICATIONS Medications self-administered by patient taken the night of the study : none reported  SLEEP ARCHITECTURE The study was initiated at 10:03:37 PM and ended at 4:39:59 AM.  Sleep onset time was 36.8 minutes and the sleep efficiency was 78.5%%. The total sleep time was 311 minutes.  Stage REM latency was 100.0 minutes.  The patient spent 2.9%% of the night in stage N1 sleep, 74.3%% in stage N2 sleep, 0.0%% in stage N3 and 22.8% in REM.  Alpha intrusion was absent.  Supine sleep was 89.07%.  RESPIRATORY PARAMETERS The overall apnea/hypopnea index (AHI) was 20.1 per hour. There were 40 total apneas, including 40 obstructive, 0 central and 0 mixed apneas. There were 64 hypopneas and 0 RERAs.  The AHI during Stage REM sleep was 67.6 per hour.  AHI while supine was 22.5 per hour.  The mean oxygen saturation was 91.2%. The minimum SpO2 during sleep was 78.0%.  moderate snoring was noted during this study.  CARDIAC DATA The 2 lead EKG demonstrated sinus rhythm. The mean heart rate was 66.4 beats  per minute. Other EKG findings include: PVCs.  LEG MOVEMENT DATA The total PLMS were 0 with a resulting PLMS index of 0.0. Associated arousal with leg movement index was 0.2 .  IMPRESSIONS - Moderate obstructive sleep apnea occurred during this study (AHI = 20.1/h). - Moderate oxygen desaturation was noted during this study (Min O2 = 78.0%). Mean O2 saturation 91.2%. - The patient snored with moderate snoring volume. - EKG findings include PVCs. - Clinically significant periodic limb movements did not occur during sleep. No significant associated arousals.  DIAGNOSIS - Obstructive Sleep Apnea (G47.33)  RECOMMENDATIONS - Consider CPAP titration sleep study or autopap. Otherr options would be based on clinical judgment. - Be careful with alcohol, sedatives and other CNS depressants that may worsen sleep apnea and disrupt normal sleep architecture. - Sleep hygiene should be reviewed to assess factors that may improve sleep quality. - Weight management and regular exercise should be initiated or continued if appropriate.  [Electronically signed] 12/16/2020 12:09 PM  Jetty Duhamel MD, ABSM Diplomate, American Board of Sleep Medicine   NPI: [id number]                          Jetty Duhamel Diplomate, American Board of Sleep Medicine  ELECTRONICALLY SIGNED ON:  12/16/2020, 12:05 PM [facility] PH: [phone]   FX: [phone] ACCREDITED BY THE AMERICAN ACADEMY OF SLEEP MEDICINE

## 2020-12-19 ENCOUNTER — Other Ambulatory Visit: Payer: Self-pay | Admitting: Orthopaedic Surgery

## 2020-12-19 DIAGNOSIS — Z01818 Encounter for other preprocedural examination: Secondary | ICD-10-CM

## 2020-12-20 ENCOUNTER — Ambulatory Visit: Payer: Medicare Other

## 2020-12-26 ENCOUNTER — Telehealth: Payer: Self-pay

## 2020-12-26 ENCOUNTER — Encounter: Payer: Self-pay | Admitting: Internal Medicine

## 2020-12-26 ENCOUNTER — Telehealth: Payer: Self-pay | Admitting: Internal Medicine

## 2020-12-26 DIAGNOSIS — J449 Chronic obstructive pulmonary disease, unspecified: Secondary | ICD-10-CM | POA: Insufficient documentation

## 2020-12-26 NOTE — Assessment & Plan Note (Signed)
Need to get her CPAP squared away. Emphasized medical indications and compliance goals. Plan- Split night sleep study, anticipating resumption of CPAP

## 2020-12-26 NOTE — Telephone Encounter (Signed)
Pulmonary surgical clearance has been addended to my 10/29/20 office note. Ok to forward to surgery office.

## 2020-12-26 NOTE — Assessment & Plan Note (Signed)
Plan- CXR 

## 2020-12-26 NOTE — Assessment & Plan Note (Signed)
Former smoker Plan- PFT

## 2020-12-26 NOTE — Telephone Encounter (Signed)
Fax received from Dr. Marcene Corning to perform a Left anterior hip arthroplasty under spinal anesthesia, scheduled on 01/22/2021 on patient.  Patient needs surgery clearance. Patient was just seen 10/29/2020. Office protocol is a risk assessment can be sent to surgeon if patient has been seen in 60 days or less.   Sending to Dr. Maple Hudson for risk assessment or recommendations if patient needs to be seen in office prior to surgical procedure.     Please send back to procedure pool as I will be out of the office

## 2020-12-26 NOTE — Telephone Encounter (Signed)
Lft VM to rtn call to schedule a Pre-Op appt  for surgery.  Lst seen 10/12/20 with Dr Lonna Cobb. Dm/cma

## 2020-12-27 ENCOUNTER — Telehealth: Payer: Self-pay

## 2020-12-27 NOTE — Telephone Encounter (Signed)
Spoke to patient and scheduled a Pre-Op appointment with Dr Veto Kemps on10/26/22 @ 3:00 pm.  Dm/cma

## 2020-12-28 NOTE — Telephone Encounter (Signed)
10/29/20 note has been faxed to [facility]. Copy in folder.

## 2020-12-29 ENCOUNTER — Other Ambulatory Visit (INDEPENDENT_AMBULATORY_CARE_PROVIDER_SITE_OTHER): Payer: Self-pay | Admitting: Family Medicine

## 2020-12-29 DIAGNOSIS — J3089 Other allergic rhinitis: Secondary | ICD-10-CM

## 2020-12-31 ENCOUNTER — Other Ambulatory Visit: Payer: Self-pay

## 2020-12-31 ENCOUNTER — Ambulatory Visit (INDEPENDENT_AMBULATORY_CARE_PROVIDER_SITE_OTHER): Payer: Medicare Other | Admitting: Physician Assistant

## 2020-12-31 ENCOUNTER — Ambulatory Visit (HOSPITAL_COMMUNITY)
Admission: RE | Admit: 2020-12-31 | Discharge: 2020-12-31 | Disposition: A | Discharge status: Home / Self Care | Payer: Medicare Other | Source: Ambulatory Visit | Attending: Vascular Surgery | Admitting: Vascular Surgery

## 2020-12-31 ENCOUNTER — Telehealth: Payer: Self-pay | Admitting: Internal Medicine

## 2020-12-31 ENCOUNTER — Encounter (HOSPITAL_COMMUNITY): Payer: Medicare Other

## 2020-12-31 ENCOUNTER — Ambulatory Visit (INDEPENDENT_AMBULATORY_CARE_PROVIDER_SITE_OTHER)
Admission: RE | Admit: 2020-12-31 | Discharge: 2020-12-31 | Disposition: A | Discharge status: Home / Self Care | Payer: Medicare Other | Source: Ambulatory Visit | Attending: Physician Assistant | Admitting: Physician Assistant

## 2020-12-31 VITALS — BP 150/62 | HR 66 | Temp 97.3°F | Resp 16 | Ht 65.0 in | Wt 244.0 lb

## 2020-12-31 DIAGNOSIS — G4733 Obstructive sleep apnea (adult) (pediatric): Secondary | ICD-10-CM

## 2020-12-31 DIAGNOSIS — I6523 Occlusion and stenosis of bilateral carotid arteries: Secondary | ICD-10-CM

## 2020-12-31 DIAGNOSIS — I739 Peripheral vascular disease, unspecified: Secondary | ICD-10-CM

## 2020-12-31 DIAGNOSIS — I872 Venous insufficiency (chronic) (peripheral): Secondary | ICD-10-CM

## 2020-12-31 NOTE — Telephone Encounter (Signed)
Her sleep study showed moderate obstructive sleep apnea, averaging 20 apneas/ hour with drops in blood oxygen level.  Please order DME [employer]- CPAP auto 5-15, mask of choice, humidifier, supplies, AirView/ card  She has an appointment in the system to f/u with me on 11/17. That needs to be changed for the 31-90 day window after she gets her machine- arbitrarily for now maybe schedule her in 4 months.  My office note from 8/15 has been amended to document her surgical clearance and can be forwarded to her surgeon.

## 2020-12-31 NOTE — Telephone Encounter (Signed)
LAST APPOINTMENT DATE: 11/29/20 NEXT APPOINTMENT DATE: 01/02/21   [facility] [id number] [place], [place] - [address] [place] [address] Phone: [phone] Fax: [phone]  [id number] - [place] ([place]), [place] - [address] [place] ([place]) [place] [address] Phone: [phone] Fax: [phone]  [id number] - [place], [place] - [address] [place] [address] Phone: [phone] Fax: [phone]  Patient is requesting a refill of the following medications: Requested Prescriptions   Pending Prescriptions Disp Refills   levocetirizine (XYZAL) 5 MG tablet [Pharmacy Med Name: LEVOCETIRIZINE 5 MG TABLET] 30 tablet 0    Sig: TAKE ONE TABLET BY MOUTH EVERY EVENING    Date last filled: 11/29/20 Previously prescribed by Dr. Sharee Holster  Lab Results  Component Value Date   HGBA1C 5.5 11/21/2020   HGBA1C 5.4 05/03/2020   HGBA1C 6.0 (H) 09/09/2018   Lab Results  Component Value Date   LDLCALC 98 05/03/2020   CREATININE 0.91 11/21/2020   Lab Results  Component Value Date   VD25OH 41.8 11/21/2020   VD25OH 40.5 05/03/2020   VD25OH 29.4 (L) 01/13/2018    BP Readings from Last 3 Encounters:  11/29/20 124/83  11/06/20 122/83  10/29/20 136/78

## 2020-12-31 NOTE — Progress Notes (Signed)
Office Note     CC:  follow up Requesting Provider:  Overton Mam, DO  HPI: Caitlyn [name] is a 70 y.o. (1951-03-06) female who presents for follow up of carotid stenosis and mixed PAD/ venous disease.  She previously was having a lot of left thigh and groin pain. The pain was worsened on going from seated or lying to standing position. At her last visit she had seen orthopedics and was offered some injections vs surgery and at the time had not made a decision on preceding. She has since decided to go forward with surgery and is scheduled to have a left hip replacement on 01/22/21  She denies any claudication, rest pain or non healing wounds. She has a known history of mild peripheral arterial disease but her symptoms have not been felt to be attributed to her arterial disease. She does continue to have swelling in BLE. She does not tolerate compression stockings and does not elevate. She has known venous insufficiency.    She has a history of chronic back pain, coronary artery disease, and hypertension.     The pt is on a statin for cholesterol management.  The pt is not on a daily aspirin.   Other AC:  Plavix The pt is on ARB, BB for hypertension.   The pt is diabetic.   Tobacco hx:  Former, quit 2011  Past Medical History:  Diagnosis Date   Anxiety    Arthritis    "legs" (01/07/2016)   Back pain    Back pain    CAD 06/2009   a.  s/p MI 4/11: tx with DES x 2 to RCA;  b.  LHC 03/17/11: LAD 30-40%, distal LAD 30-40%, OM2 40-50%, RCA stent patent, EF greater than 70%.;  c.  Lexiscan Myoview (12/15):  Mild apical thinning, no ischemia, EF 61%; normal study   CAROTID STENOSIS    carotid Dopplers 03/25/11: RICA 60-79%; LICA 0-39%.  Follow up recommended in 6 months.   Constipation    Constipation    COPD (chronic obstructive pulmonary disease) (HCC)    Depression    Gout    "on RX for it" (01/07/2016)   Heart disease    History of heart attack    Hx of blood clots     HYPERLIPIDEMIA    HYPERTENSION    HYPOTHYROIDISM    post ablation tx Graves   Hypothyroidism    Joint pain    Myocardial infarction [facility]) 2012   "mini"   OSA on CPAP    Osteoarthritis    Pre-diabetes    PVD    Rheumatoid arthritis (HCC)    Sleep apnea    SOBOE (shortness of breath on exertion)    TOBACCO ABUSE quit 06/2009   Varicose veins    VITAMIN D DEFICIENCY    DEXA 04/2009 normal    Past Surgical History:  Procedure Laterality Date   CARDIAC CATHETERIZATION N/A 01/07/2016   Procedure: Left Heart Cath and Coronary Angiography;  Surgeon: Rinaldo Cloud, MD;  Location: [facility] INVASIVE CV LAB;  Service: Cardiovascular;  Laterality: N/A;   CARDIAC CATHETERIZATION N/A 01/07/2016   Procedure: Coronary Stent Intervention;  Surgeon: Rinaldo Cloud, MD;  Location: [facility] INVASIVE CV LAB;  Service: Cardiovascular;  Laterality: N/A;   CAROTID STENT Left    bilateral carotid artery disease post stent of left carotid   CESAREAN SECTION  1985   "twins"   CORONARY ANGIOPLASTY     LEFT HEART CATHETERIZATION WITH CORONARY ANGIOGRAM N/A  03/17/2011   Procedure: LEFT HEART CATHETERIZATION WITH CORONARY ANGIOGRAM;  Surgeon: Herby Abraham, MD;  Location: [facility] CATH LAB;  Service: Cardiovascular;  Laterality: N/A;   PERIPHERAL VASCULAR CATHETERIZATION N/A 04/06/2015   Procedure: Abdominal Aortogram;  Surgeon: Sherren Kerns, MD;  Location: [facility] INVASIVE CV LAB;  Service: Cardiovascular;  Laterality: N/A;   RADIOACTIVE PLAQUE INSERTION     Graves disease post radioactive treatment complicated hypothyroidism    Social History   Socioeconomic History   Marital status: Divorced    Spouse name: Not on file   Number of children: Not on file   Years of education: Not on file   Highest education level: Not on file  Occupational History   Occupation: retired, Comptroller for mental health  Tobacco Use   Smoking status: Former    Packs/day: 1.00    Years: 35.00    Pack years: 35.00    Types: Cigarettes     Quit date: 06/30/2009    Years since quitting: 11.5   Smokeless tobacco: Never  Vaping Use   Vaping Use: Never used  Substance and Sexual Activity   Alcohol use: Yes    Alcohol/week: 2.0 standard drinks    Types: 2 Glasses of wine per week    Comment: occ   Drug use: No   Sexual activity: Not Currently    Comment: Quit smoking after MI- prev 1ppd x 35ys  Other Topics Concern   Not on file  Social History Narrative   Not on file   Social Determinants of Health   Financial Resource Strain: Not on file  Food Insecurity: Not on file  Transportation Needs: Not on file  Physical Activity: Not on file  Stress: Not on file  Social Connections: Not on file  Intimate Partner Violence: Not on file    Family History  Problem Relation Age of Onset   Heart attack Mother    Stroke Mother    Cancer Mother    Varicose Veins Mother    Thyroid disease Mother    Hypertension Mother    Heart disease Mother    Sudden death Mother    Depression Mother    Cancer Father    Heart disease Father    Hypertension Father    Coronary artery disease Other    Hypertension Other    Hypertension Sister    Breast cancer Paternal Aunt     Current Outpatient Medications  Medication Sig Dispense Refill   allopurinol (ZYLOPRIM) 300 MG tablet Take 300 mg by mouth daily.     carvedilol (COREG) 6.25 MG tablet Take 1 tablet (6.25 mg total) by mouth 2 (two) times daily with a meal. 180 tablet 1   Cholecalciferol (VITAMIN D) 50 MCG (2000 UT) tablet Take 1 tablet (2,000 Units total) by mouth daily.     clopidogrel (PLAVIX) 75 MG tablet Take 75 mg by mouth daily.   2   colchicine 0.6 MG tablet Take 0.6 mg by mouth daily as needed (for flare ups). Flare ups     furosemide (LASIX) 40 MG tablet Take 40 mg by mouth daily.     levothyroxine (SYNTHROID, LEVOTHROID) 125 MCG tablet Take 125 mcg by mouth daily before breakfast.     LINZESS 145 MCG CAPS capsule Take 1 capsule (145 mcg total) by mouth daily. 30  capsule 0   losartan (COZAAR) 50 MG tablet Take 1 tablet (50 mg total) by mouth daily. 90 tablet 3   nitroGLYCERIN (NITROSTAT) 0.4 MG SL tablet  potassium chloride (KLOR-CON) 10 MEQ tablet Take 1 tablet (10 mEq total) by mouth daily as needed. Taking 1 tablet when taking Lasix 30 tablet 0   rosuvastatin (CRESTOR) 10 MG tablet One tab po every other night b-4 bed     Semaglutide, 1 MG/DOSE, 4 MG/3ML SOPN Inject 1 mg into the skin every 7 (seven) days. 1 mg wkly 3 mL 0   Vitamin D, Ergocalciferol, (DRISDOL) 1.25 MG (50000 UNIT) CAPS capsule 1 PO Q 5 DAYS 6 capsule 0   zinc gluconate 50 MG tablet Take 50 mg by mouth daily.     levocetirizine (XYZAL) 5 MG tablet TAKE ONE TABLET BY MOUTH EVERY EVENING (Patient not taking: Reported on 12/31/2020) 30 tablet 0   No current facility-administered medications for this visit.    Allergies  Allergen Reactions   Amlodipine Swelling   Statins Other (See Comments)    myalgia     REVIEW OF SYSTEMS:  [X]  denotes positive finding, [ ]  denotes negative finding Cardiac  Comments:  Chest pain or chest pressure:    Shortness of breath upon exertion:    Short of breath when lying flat:    Irregular heart rhythm:        Vascular    Pain in calf, thigh, or hip brought on by ambulation:    Pain in feet at night that wakes you up from your sleep:     Blood clot in your veins:    Leg swelling:  X       Pulmonary    Oxygen at home:    Productive cough:     Wheezing:         Neurologic    Sudden weakness in arms or legs:     Sudden numbness in arms or legs:     Sudden onset of difficulty speaking or slurred speech:    Temporary loss of vision in one eye:     Problems with dizziness:         Gastrointestinal    Blood in stool:     Vomited blood:         Genitourinary    Burning when urinating:     Blood in urine:        Psychiatric    Major depression:         Hematologic    Bleeding problems:    Problems with blood clotting too  easily:        Skin    Rashes or ulcers:        Constitutional    Fever or chills:      PHYSICAL EXAMINATION:  Vitals:   12/31/20 1408  BP: (!) 153/89  Pulse: 66  Resp: 16  Temp: (!) 97.3 F (36.3 C)  TempSrc: Temporal  SpO2: 100%  Weight: 244 lb (110.7 kg)  Height: 5\' 5"  (1.651 m)    General:  WDWN in NAD; vital signs documented above Gait: Normal HENT: WNL, normocephalic Pulmonary: normal non-labored breathing , without wheezing Cardiac: regular HR, without  Murmurs without carotid bruit Abdomen: soft, NT, no masses Vascular Exam/Pulses:  Right Left  Radial 2+ (normal) 2+ (normal)   Femoral 2+ (normal) 2+ (normal)  Popliteal Not palpable Not palpable  DP 2+ (normal) 2+ (normal)  PT Not palpable secondary to edema Not palpable secondary to edema   Extremities: without ischemic changes, without Gangrene , without cellulitis; without open wounds;  Musculoskeletal: no muscle wasting or atrophy  Neurologic: A&O X 3;  No  focal weakness or paresthesias are detected Psychiatric:  The pt has Normal affect.   Non-Invasive Vascular Imaging:  12/31/20  +-------+-----------+-----------+------------+------------+  ABI/TBIToday's ABIToday's TBIPrevious ABIPrevious TBI  +-------+-----------+-----------+------------+------------+  Right  0.62       0.36       0.69        0.64          +-------+-----------+-----------+------------+------------+  Left   0.86       0.53       0.98        0.6           +-------+-----------+-----------+------------+------------+   VAS US Carotid Duplex:  Right Carotid: Velocities in the right ICA are consistent with a 60-79% stenosis.   Left Carotid: Velocities in the left ICA are consistent with a 1-39% stenosis.   ASSESSMENT/PLAN:: 70 y.o. female here for follow up of carotid stenosis and mixed PAD/ venous disease. She is without any neurological symptoms relatable to her carotid stenosis. Her duplex today is stable with  right ICA stenosis of 60-79%, left 1-39% -Duplex shows antegrade flow in both vertebral arteries and no hemodynamically significant stenosis of bilateral subclavian arteries -She has known mild arterial disease and is not having any claudication, rest pain or non healing wounds. Her ABIs today are stable from prior study - She has known venous insufficiency. I have encouraged her to elevate daily and exercise however this is limited due to her left hip pain - She is scheduled to have left hip replacement. From vascular standpoint would be okay to hold her Plavix for surgery. - She will continue her Statin and Plavix - reviewed signs and symptoms of TIA/ stroke and she knows to call 911 or go to ER should these occur - She will follow up with [place] in 6 months with repeat ABIs and carotid duplex  Graceann Congress, PA-C [facility] [phone]  Clinic MD:   Dr. Karin Lieu

## 2020-12-31 NOTE — Telephone Encounter (Signed)
I have called the pt and she is aware of results of sleep study per CY and she is aware that the order has been placed to get set up with cpap.     Will route the message to the procedure pool for the letter to be faxed to her surgeon.  Nothing further is needed.

## 2020-12-31 NOTE — Telephone Encounter (Signed)
Pt is calling for the results of her sleep study.  CY please advise. Thanks   Pt is needing to have surgical clearance for her hip surgery that is scheduled on 01/22/2021.

## 2021-01-02 ENCOUNTER — Encounter (INDEPENDENT_AMBULATORY_CARE_PROVIDER_SITE_OTHER): Payer: Self-pay | Admitting: Family Medicine

## 2021-01-02 ENCOUNTER — Ambulatory Visit (INDEPENDENT_AMBULATORY_CARE_PROVIDER_SITE_OTHER): Payer: Medicare Other | Admitting: Family Medicine

## 2021-01-02 ENCOUNTER — Other Ambulatory Visit: Payer: Self-pay

## 2021-01-02 VITALS — BP 136/77 | HR 68 | Ht 65.0 in | Wt 240.0 lb

## 2021-01-02 DIAGNOSIS — R7303 Prediabetes: Secondary | ICD-10-CM

## 2021-01-02 DIAGNOSIS — K5909 Other constipation: Secondary | ICD-10-CM | POA: Diagnosis not present

## 2021-01-02 DIAGNOSIS — E559 Vitamin D deficiency, unspecified: Secondary | ICD-10-CM

## 2021-01-02 DIAGNOSIS — J3089 Other allergic rhinitis: Secondary | ICD-10-CM

## 2021-01-02 DIAGNOSIS — I10 Essential (primary) hypertension: Secondary | ICD-10-CM

## 2021-01-02 DIAGNOSIS — Z6841 Body Mass Index (BMI) 40.0 and over, adult: Secondary | ICD-10-CM | POA: Diagnosis not present

## 2021-01-02 MED ORDER — SEMAGLUTIDE (1 MG/DOSE) 4 MG/3ML ~~LOC~~ SOPN: 1.0000 mg | PEN_INJECTOR | SUBCUTANEOUS | 0 refills | Status: DC

## 2021-01-02 MED ORDER — LINZESS 145 MCG PO CAPS: 145.0000 ug | ORAL_CAPSULE | Freq: Every day | ORAL | 0 refills | Status: DC

## 2021-01-02 MED ORDER — LOSARTAN POTASSIUM 50 MG PO TABS: 50.0000 mg | ORAL_TABLET | Freq: Every day | ORAL | 0 refills | Status: DC

## 2021-01-03 NOTE — Progress Notes (Signed)
Chief Complaint:   OBESITY Caitlyn [name] is here to discuss her progress with her obesity treatment plan along with follow-up of her obesity related diagnoses. Caitlyn [name] is on the Category 2 Plan and states she is following her eating plan approximately 80% of the time. Caitlyn [name] states she is walking for 30 minutes 3 times per week.  Today's visit was #: 11 Starting weight: 248 lbs Starting date: 05/03/2020 Today's weight: 240 lbs Today's date: 01/02/2021 Total lbs lost to date: 8 lbs Total lbs lost since last in-office visit: 3 lbs  Interim History: Caitlyn [name] is not skipping meals. She has for breakfast a Premier protein shake, fruit and eggs.  She is weighing protein for lunch and dinner. She is having surgery November 8th for total hip replacement.   Subjective:   1. Pre-diabetes Caitlyn [name] states Ozempic is helping with hunger and cravings. She is tolerating it well. She has a diagnosis of prediabetes based on her elevated HgA1c and was informed this puts her at greater risk of developing diabetes. She continues to work on diet and exercise to decrease her risk of diabetes. She denies nausea or hypoglycemia.  2. Vitamin D deficiency She is currently taking prescription vitamin D 2,000 IU each week. She denies nausea, vomiting or muscle weakness.  3. Chronic constipation Caitlyn [name] notes constipation. Gastrointestinal ROS: no abdominal pain, change in bowel habits, or black or bloody stools.    4. Essential hypertension, benign Caitlyn [name] is taking medications as instructed, no medication side effects noted, no chest pain on exertion, no dyspnea on exertion, no swelling of ankles.   Assessment/Plan:  No orders of the defined types were placed in this encounter.   Medications Discontinued During This Encounter  Medication Reason   losartan (COZAAR) 50 MG tablet Reorder   LINZESS 145 MCG CAPS capsule Reorder   Semaglutide, 1 MG/DOSE, 4 MG/3ML SOPN Reorder     Meds ordered this encounter   Medications   LINZESS 145 MCG CAPS capsule    Sig: Take 1 capsule (145 mcg total) by mouth daily.    Dispense:  30 capsule    Refill:  0    30 d supply;  ** OV for RF **   Do not send RF request   Semaglutide, 1 MG/DOSE, 4 MG/3ML SOPN    Sig: Inject 1 mg into the skin every 7 (seven) days. 1 mg wkly    Dispense:  3 mL    Refill:  0    30 d supply;  ** OV for RF **   Do not send RF request   losartan (COZAAR) 50 MG tablet    Sig: Take 1 tablet (50 mg total) by mouth daily.    Dispense:  30 tablet    Refill:  0    Pt will obtain from PCP in future.  Has appt 10/26 but will run out prior     1. Pre-diabetes Caitlyn [name] will continue to work on weight loss, exercise, and decreasing simple carbohydrates to help decrease the risk of diabetes. We will refill Ozempic at 1 mg since hunger and cravings are under control. Prudent Nutritional Plan and weight loss.  - Semaglutide, 1 MG/DOSE, 4 MG/3ML SOPN; Inject 1 mg into the skin every 7 (seven) days. 1 mg wkly  Dispense: 3 mL; Refill: 0  2. Vitamin D deficiency Low Vitamin D level contributes to fatigue and are associated with obesity, breast, and colon cancer.  Caitlyn [name] will follow-up for routine testing of Vitamin D, at least  2-3 times per year to avoid over-replacement.  3. Chronic constipation Caitlyn [name] was informed that a decrease in bowel movement frequency is normal while losing weight, but stools should not be hard or painful. Orders and follow up as documented in patient record.   Counseling Getting to Good Bowel Health: Your goal is to have one soft bowel movement each day. Drink at least 8 glasses of water each day. Eat plenty of fiber (goal is over 25 grams each day). It is best to get most of your fiber from dietary sources which includes leafy green vegetables, fresh fruit, and whole grains. You may need to add fiber with the help of OTC fiber supplements. These include Metamucil, Citrucel, and Flaxseed. If you are still having trouble,  try adding Miralax or Magnesium Citrate. If all of these changes do not work, Dietitian. We will refill Linzess today.   - LINZESS 145 MCG CAPS capsule; Take 1 capsule (145 mcg total) by mouth daily.  Dispense: 30 capsule; Refill: 0  4. Essential hypertension, benign Caitlyn [name] is working on healthy weight loss and exercise to improve blood pressure control. We will refill Losartan today. We will watch for signs of hypotension as she continues her lifestyle modifications.  - losartan (COZAAR) 50 MG tablet; Take 1 tablet (50 mg total) by mouth daily.  Dispense: 30 tablet; Refill: 0  5. Obesity with current BMI of 40.1 Caitlyn [name] is currently in the action stage of change. As such, her goal is to continue with weight loss efforts. She has agreed to the Category 2 Plan.   Caitlyn [name] will follow up prior to her surgery in 2-3 weeks.   Exercise goals:  Caitlyn [name] is walking for 150 minutes 3 times per week. She will change to 30 minutes 5 days per week.   Behavioral modification strategies: increasing lean protein intake and planning for success.  Caitlyn [name] has agreed to follow-up with our clinic in 2-3 weeks. She was informed of the importance of frequent follow-up visits to maximize her success with intensive lifestyle modifications for her multiple health conditions.   Objective:   Blood pressure 136/77, pulse 68, height 5\' 5"  (1.651 m), weight 240 lb (108.9 kg), SpO2 96 %. Body mass index is 39.94 kg/m.  General: Cooperative, alert, well developed, in no acute distress. HEENT: Conjunctivae and lids unremarkable. Cardiovascular: Regular rhythm.  Lungs: Normal work of breathing. Neurologic: No focal deficits.   Lab Results  Component Value Date   CREATININE 0.91 11/21/2020   BUN 25 11/21/2020   NA 141 11/21/2020   K 4.6 11/21/2020   CL 105 11/21/2020   CO2 23 11/21/2020   Lab Results  Component Value Date   ALT 13 11/21/2020   AST 19 11/21/2020   ALKPHOS 125 (H) 11/21/2020    BILITOT 0.3 11/21/2020   Lab Results  Component Value Date   HGBA1C 5.5 11/21/2020   HGBA1C 5.4 05/03/2020   HGBA1C 6.0 (H) 09/09/2018   HGBA1C 5.7 (H) 01/13/2018   Lab Results  Component Value Date   INSULIN 13.8 11/21/2020   INSULIN 14.6 05/03/2020   INSULIN 27.7 (H) 01/13/2018   Lab Results  Component Value Date   TSH 1.080 11/21/2020   Lab Results  Component Value Date   CHOL 173 05/03/2020   HDL 58 05/03/2020   LDLCALC 98 05/03/2020   LDLDIRECT 156.4 08/02/2012   TRIG 96 05/03/2020   CHOLHDL 3.0 05/03/2020   Lab Results  Component Value Date   VD25OH 41.8 11/21/2020  VD25OH 40.5 05/03/2020   VD25OH 29.4 (L) 01/13/2018   Lab Results  Component Value Date   WBC 5.6 05/03/2020   HGB 14.4 05/03/2020   HCT 44.6 05/03/2020   MCV 93 05/03/2020   PLT 232 05/03/2020   Lab Results  Component Value Date   FERRITIN 351 (H) 09/12/2018    Obesity Behavioral Intervention:   Approximately 15 minutes were spent on the discussion below.  ASK: We discussed the diagnosis of obesity with Caitlyn [name] today and Caitlyn [name] agreed to give [place] permission to discuss obesity behavioral modification therapy today.  ASSESS: Caitlyn [name] has the diagnosis of obesity and her BMI today is 40.1. Caitlyn [name] is in the action stage of change.   ADVISE: Caitlyn [name] was educated on the multiple health risks of obesity as well as the benefit of weight loss to improve her health. She was advised of the need for long term treatment and the importance of lifestyle modifications to improve her current health and to decrease her risk of future health problems.  AGREE: Multiple dietary modification options and treatment options were discussed and Caitlyn [name] agreed to follow the recommendations documented in the above note.  ARRANGE: Caitlyn [name] was educated on the importance of frequent visits to treat obesity as outlined per CMS and USPSTF guidelines and agreed to schedule her next follow up appointment  today.  Attestation Statements:   Reviewed by clinician on day of visit: allergies, medications, problem list, medical history, surgical history, family history, social history, and previous encounter notes.  I, Jackson Latino, RMA, am acting as Energy manager for [employer], DO.   I have reviewed the above documentation for accuracy and completeness, and I agree with the above. Carlye Grippe, D.O.  The 21st Century Cures Act was signed into law in 2016 which includes the topic of electronic health records.  This provides immediate access to information in MyChart.  This includes consultation notes, operative notes, office notes, lab results and pathology reports.  If you have any questions about what you read please let us know at your next visit [name] we can discuss your concerns and take corrective action if need be.  We are right here with you.

## 2021-01-04 ENCOUNTER — Other Ambulatory Visit: Payer: Self-pay

## 2021-01-04 DIAGNOSIS — I739 Peripheral vascular disease, unspecified: Secondary | ICD-10-CM

## 2021-01-04 DIAGNOSIS — I6523 Occlusion and stenosis of bilateral carotid arteries: Secondary | ICD-10-CM

## 2021-01-08 ENCOUNTER — Other Ambulatory Visit: Payer: Self-pay

## 2021-01-09 ENCOUNTER — Ambulatory Visit (INDEPENDENT_AMBULATORY_CARE_PROVIDER_SITE_OTHER): Payer: Medicare Other | Admitting: Family Medicine

## 2021-01-09 VITALS — BP 122/70 | HR 64 | Temp 97.9°F | Ht 65.0 in | Wt 245.8 lb

## 2021-01-09 DIAGNOSIS — Z01818 Encounter for other preprocedural examination: Secondary | ICD-10-CM | POA: Diagnosis not present

## 2021-01-09 DIAGNOSIS — Z6841 Body Mass Index (BMI) 40.0 and over, adult: Secondary | ICD-10-CM

## 2021-01-09 DIAGNOSIS — Z955 Presence of coronary angioplasty implant and graft: Secondary | ICD-10-CM | POA: Diagnosis not present

## 2021-01-09 DIAGNOSIS — I2510 Atherosclerotic heart disease of native coronary artery without angina pectoris: Secondary | ICD-10-CM

## 2021-01-09 DIAGNOSIS — I739 Peripheral vascular disease, unspecified: Secondary | ICD-10-CM | POA: Diagnosis not present

## 2021-01-09 DIAGNOSIS — Z23 Encounter for immunization: Secondary | ICD-10-CM

## 2021-01-09 DIAGNOSIS — J449 Chronic obstructive pulmonary disease, unspecified: Secondary | ICD-10-CM

## 2021-01-09 DIAGNOSIS — R7303 Prediabetes: Secondary | ICD-10-CM

## 2021-01-09 DIAGNOSIS — E890 Postprocedural hypothyroidism: Secondary | ICD-10-CM

## 2021-01-09 DIAGNOSIS — E66813 Obesity, class 3: Secondary | ICD-10-CM

## 2021-01-09 DIAGNOSIS — E782 Mixed hyperlipidemia: Secondary | ICD-10-CM

## 2021-01-09 DIAGNOSIS — I10 Essential (primary) hypertension: Secondary | ICD-10-CM

## 2021-01-09 NOTE — Progress Notes (Signed)
[facility] PRIMARY CARE [facility] [address] [place] [address] Dept: [phone] Dept Fax: [phone]  Office Visit  Subjective:    Patient ID: Caitlyn [name], female    DOB: April 18, 1950, 70 y.o..   MRN: [id number]  Chief Complaint  Patient presents with   Pre-op Exam    Pre-Op clearance for hip surgery.  Wants flu shot today.      History of Present Illness:  Patient is in today for a preoperative assessment. Ms. Laster is scheduled on Nov. 8th for a left anterior hip arthroplasty under spinal anesthesia.  Ms. Frieson has a history of coronary artery disease with two prior myocardial infarctions and PTCA with stent placement. She has not seen a cardiologist in ~ 5 years. She is managed on Plavix and carvedilol. Ms. Haralson also has a history of carotid stenosis with a prior carotid stent and mild peripheral arterial disease. She is followed by Dr. Sherral Hammers. She has some occasional pedal edema that appears to be related to venous stasis and uses PRN furosemide. She is on rosuvastatin for lipid management. And takes losartan for blood pressure control. additionally, she has a remote history of a DVT, but is not on anticoagulation.  Ms. Mclaren has a history of sleep apnea and is managed on CPAP. She also has been noted to have spirometry evidence of mild COPD. She is a former smoker, having quit in 2011.  Ms. Grays has a history of obesity and prediabetes. She is currently on semaglutide to assist with weight loss prior to her surgery.  ms. Sommerville has a history of Graves disease and is s/p thyroid ablation. She now takes Synthroid for hypothyroidism.  Past Medical History: Patient Active Problem List   Diagnosis Date Noted   History of coronary angioplasty with insertion of stent 01/09/2021   COPD mixed type (HCC)    Chronic constipation 07/12/2020   Essential hypertension 05/21/2020   Seasonal and perennial allergic rhinitis 02/17/2020    Obstructive sleep apnea 10/14/2019   Insomnia 10/14/2019   Hx of Graves' disease 04/19/2019   Chronic midline low back pain without sciatica 04/02/2019   History of DVT (deep vein thrombosis) 03/30/2019   COVID-19 virus infection 09/08/2018   Class 3 severe obesity with serious comorbidity and body mass index (BMI) of 40.0 to 44.9 in adult [facility]) 02/23/2018   Prediabetes 01/13/2018   Upper airway cough syndrome 05/29/2016   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   Acute coronary syndrome (HCC) 01/06/2016   Edema 03/16/2013   Gout 03/16/2013   Arthralgia of ankle 03/16/2013   Depression    Leg pain 07/31/2011   Vitamin D deficiency 03/15/2010   Carotid stenosis 07/24/2009   Hyperlipidemia 07/23/2009   CAD (coronary artery disease) 07/23/2009   Peripheral vascular disease (HCC) 07/23/2009   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   Past Surgical History:  Procedure Laterality Date   CARDIAC CATHETERIZATION N/A 01/07/2016   Procedure: Left Heart Cath and Coronary Angiography;  Surgeon: Rinaldo Cloud, MD;  Location: [facility] INVASIVE CV LAB;  Service: Cardiovascular;  Laterality: N/A;   CARDIAC CATHETERIZATION N/A 01/07/2016   Procedure: Coronary Stent Intervention;  Surgeon: Rinaldo Cloud, MD;  Location: [facility] INVASIVE CV LAB;  Service: Cardiovascular;  Laterality: N/A;   CAROTID STENT Left    bilateral carotid artery disease post stent of left carotid   CESAREAN SECTION  1985   "twins"   CORONARY ANGIOPLASTY     LEFT HEART CATHETERIZATION WITH CORONARY ANGIOGRAM N/A 03/17/2011   Procedure: LEFT  HEART CATHETERIZATION WITH CORONARY ANGIOGRAM;  Surgeon: Herby Abraham, MD;  Location: [facility] CATH LAB;  Service: Cardiovascular;  Laterality: N/A;   PERIPHERAL VASCULAR CATHETERIZATION N/A 04/06/2015   Procedure: Abdominal Aortogram;  Surgeon: Sherren Kerns, MD;  Location: [facility] INVASIVE CV LAB;  Service: Cardiovascular;  Laterality: N/A;   RADIOACTIVE PLAQUE INSERTION     Graves disease post  radioactive treatment complicated hypothyroidism   Family History  Problem Relation Age of Onset   Heart attack Mother    Stroke Mother    Cancer Mother    Varicose Veins Mother    Thyroid disease Mother    Hypertension Mother    Heart disease Mother    Sudden death Mother    Depression Mother    Cancer Father    Heart disease Father    Hypertension Father    Coronary artery disease Other    Hypertension Other    Hypertension Sister    Breast cancer Paternal Aunt    Outpatient Medications Prior to Visit  Medication Sig Dispense Refill   allopurinol (ZYLOPRIM) 300 MG tablet Take 300 mg by mouth daily.     carvedilol (COREG) 6.25 MG tablet Take 1 tablet (6.25 mg total) by mouth 2 (two) times daily with a meal. 180 tablet 1   Cholecalciferol (VITAMIN D) 50 MCG (2000 UT) tablet Take 1 tablet (2,000 Units total) by mouth daily.     clopidogrel (PLAVIX) 75 MG tablet Take 75 mg by mouth daily.   2   colchicine 0.6 MG tablet Take 0.6 mg by mouth daily as needed (for flare ups). Flare ups     furosemide (LASIX) 40 MG tablet Take 40 mg by mouth daily.     levothyroxine (SYNTHROID, LEVOTHROID) 125 MCG tablet Take 125 mcg by mouth daily before breakfast.     LINZESS 145 MCG CAPS capsule Take 1 capsule (145 mcg total) by mouth daily. 30 capsule 0   losartan (COZAAR) 50 MG tablet Take 1 tablet (50 mg total) by mouth daily. 30 tablet 0   nitroGLYCERIN (NITROSTAT) 0.4 MG SL tablet      potassium chloride (KLOR-CON) 10 MEQ tablet Take 1 tablet (10 mEq total) by mouth daily as needed. Taking 1 tablet when taking Lasix 30 tablet 0   rosuvastatin (CRESTOR) 10 MG tablet One tab po every other night b-4 bed     Semaglutide, 1 MG/DOSE, 4 MG/3ML SOPN Inject 1 mg into the skin every 7 (seven) days. 1 mg wkly 3 mL 0   Vitamin D, Ergocalciferol, (DRISDOL) 1.25 MG (50000 UNIT) CAPS capsule 1 PO Q 5 DAYS 6 capsule 0   zinc gluconate 50 MG tablet Take 50 mg by mouth daily.     levocetirizine (XYZAL) 5 MG  tablet TAKE ONE TABLET BY MOUTH EVERY EVENING 30 tablet 0   No facility-administered medications prior to visit.   Allergies  Allergen Reactions   Amlodipine Swelling   Lisinopril Cough   Statins Other (See Comments)    myalgia    Objective:   Today's Vitals   01/09/21 1453  BP: 122/70  Pulse: 64  Temp: 97.9 F (36.6 C)  TempSrc: Temporal  SpO2: 98%  Weight: 245 lb 12.8 oz (111.5 kg)  Height: 5\' 5"  (1.651 m)   Body mass index is 40.9 kg/m.   General: Well developed, well nourished. No acute distress. HEENT: Normocephalic, non-traumatic. PERRL, EOMI. Conjunctiva clear. Mild exophthalmos.   External ears normal. EAC and TMs normal bilaterally. Nose clear without congestion  or   rhinorrhea. Mucous membranes moist. Oropharynx clear. Good dentition. Neck: Supple. No lymphadenopathy. No thyromegaly. No bruits. Lungs: Clear to auscultation bilaterally. No wheezing, rales or rhonchi. CV: RRR without murmurs or rubs. Pulses 2+ bilaterally. Extremities: Full ROM. No joint swelling or tenderness. No edema noted. Skin: Warm and dry. No rashes. Psych: Alert and oriented. Normal mood and affect.  Health Maintenance Due  Topic Date Due   Pneumonia Vaccine 63+ Years old (1 - PCV) 02/03/1959   Hepatitis C Screening  Never done   Zoster Vaccines- Shingrix (1 of 2) Never done   COVID-19 Vaccine (3 - Pfizer risk series) 02/20/2020   INFLUENZA VACCINE  10/15/2020   Imaging: VAS US Carotid Duplex:  Right Carotid: Velocities in the right ICA are consistent with a 60-79% stenosis.  Left Carotid: Velocities in the left ICA are consistent with a 1-39% stenosis.   Lab Results Lab Results  Component Value Date   TSH 1.080 11/21/2020   CMP Latest Ref Rng & Units 11/21/2020 05/03/2020 09/12/2018  Glucose 65 - 99 mg/dL 81 85 742(V)  BUN 8 - 27 mg/dL 25 21 95(G)  Creatinine 0.57 - 1.00 mg/dL 3.87 5.64 3.32  Sodium 134 - 144 mmol/L 141 142 138  Potassium 3.5 - 5.2 mmol/L 4.6 4.4 3.5   Chloride 96 - 106 mmol/L 105 104 100  CO2 20 - 29 mmol/L 23 18(L) 27  Calcium 8.7 - 10.3 mg/dL 95.1 88.4 1.6(S)  Total Protein 6.0 - 8.5 g/dL 7.0 7.5 6.4(L)  Total Bilirubin 0.0 - 1.2 mg/dL 0.3 0.2 0.6(T)  Alkaline Phos 44 - 121 IU/L 125(H) 141(H) 48  AST 0 - 40 IU/L 19 19 29   ALT 0 - 32 IU/L 13 11 50(H)   Lab Results  Component Value Date   HGBA1C 5.5 11/21/2020     Lab Results  Component Value Date   CHOL 173 05/03/2020   HDL 58 05/03/2020   LDLCALC 98 05/03/2020   LDLDIRECT 156.4 08/02/2012   TRIG 96 05/03/2020   CHOLHDL 3.0 05/03/2020   Assessment & Plan:   1. Preoperative examination Completed Preoperative Clearance Form for orthopedics. I do recommend she see a cardiologist for specific cardiac clearance, esp. as she is a new patient to me and has not seen cardiology in 5 years.  2. Coronary artery disease involving native coronary artery of native heart without angina pectoris 3. History of coronary angioplasty with insertion of stent 4. Peripheral vascular disease (HCC) Prior history of PTCA with stent placement. I am surprised that Ms. Fratto is only on Plavix and not aspirin as well. I will refer her to cardiology for preoperative clearance and advice on stopping her Plavix for surgery.  - Ambulatory referral to Cardiology  5. Essential hypertension Ms. Sima's blood pressure is at goal on losartan. Renal function is good.  6. COPD mixed type (HCC) Followed by pulmonology. Doing well without need for inhalers.  7. 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 Continue levothyroxine.  8. Mixed hyperlipidemia Last lipids in the spring show LDL significantly lower, but not < 70. She is on moderate-intensity statin therapy. She may need to increase her dosage for further LDL: lowering, but will have cardiology evaluate this.  9. Prediabetes A1c is now normal. Continue semaglutide.  10. Class 3 severe obesity with serious comorbidity and body mass index (BMI) of  40.0 to 44.9 in adult, unspecified obesity type (HCC) Weight is down 33 lbs in the past 3 years. Currently on semaglutide.  10-06-1990  Kerri Perches, MD

## 2021-01-10 ENCOUNTER — Other Ambulatory Visit (INDEPENDENT_AMBULATORY_CARE_PROVIDER_SITE_OTHER): Payer: Self-pay | Admitting: Family Medicine

## 2021-01-10 ENCOUNTER — Other Ambulatory Visit: Payer: Self-pay

## 2021-01-10 ENCOUNTER — Ambulatory Visit (INDEPENDENT_AMBULATORY_CARE_PROVIDER_SITE_OTHER): Payer: Medicare Other | Admitting: Cardiovascular Disease

## 2021-01-10 ENCOUNTER — Encounter: Payer: Self-pay | Admitting: Cardiovascular Disease

## 2021-01-10 VITALS — BP 140/80 | HR 57 | Ht 65.0 in | Wt 241.2 lb

## 2021-01-10 DIAGNOSIS — I5032 Chronic diastolic (congestive) heart failure: Secondary | ICD-10-CM

## 2021-01-10 DIAGNOSIS — I10 Essential (primary) hypertension: Secondary | ICD-10-CM

## 2021-01-10 DIAGNOSIS — E782 Mixed hyperlipidemia: Secondary | ICD-10-CM

## 2021-01-10 DIAGNOSIS — K5909 Other constipation: Secondary | ICD-10-CM

## 2021-01-10 DIAGNOSIS — Z01810 Encounter for preprocedural cardiovascular examination: Secondary | ICD-10-CM | POA: Diagnosis not present

## 2021-01-10 DIAGNOSIS — I25118 Atherosclerotic heart disease of native coronary artery with other forms of angina pectoris: Secondary | ICD-10-CM

## 2021-01-10 DIAGNOSIS — R0602 Shortness of breath: Secondary | ICD-10-CM

## 2021-01-10 MED ORDER — ASPIRIN EC 81 MG PO TBEC: 81.0000 mg | DELAYED_RELEASE_TABLET | Freq: Every day | ORAL | 3 refills | Status: DC

## 2021-01-10 NOTE — Patient Instructions (Signed)
Medication Instructions:  Your physician has recommended you make the following change in your medication:   STOP Clopidogrel (Plavix) START Aspirin 81 mg once a day  *If you need a refill on your cardiac medications before your next appointment, please call your pharmacy*   Lab Work: None  If you have labs (blood work) drawn today and your tests are completely normal, you will receive your results only by: MyChart Message (if you have MyChart) OR A paper copy in the mail If you have any lab test that is abnormal or we need to change your treatment, we will call you to review the results.   Testing/Procedures: [facility] MYOVIEW  Your caregiver has ordered a Stress Test with nuclear imaging. The purpose of this test is to evaluate the blood supply to your heart muscle. This procedure is referred to as a "Non-Invasive Stress Test." This is because other than having an IV started in your vein, nothing is inserted or "invades" your body. Cardiac stress tests are done to find areas of poor blood flow to the heart by determining the extent of coronary artery disease (CAD). Some patients exercise on a treadmill, which naturally increases the blood flow to your heart, while others who are  unable to walk on a treadmill due to physical limitations have a pharmacologic/chemical stress agent called Lexiscan . This medicine will mimic walking on a treadmill by temporarily increasing your coronary blood flow.   Please note: these test may take anywhere between 2-4 hours to complete   Date of Procedure:_____________________________________  Arrival Time for Procedure:______________________________  Instructions regarding medication:   _XX___ : Hold Furosemide the morning of procedure   PLEASE NOTIFY THE OFFICE AT LEAST 24 HOURS IN ADVANCE IF YOU ARE UNABLE TO KEEP YOUR APPOINTMENT.     How to prepare for your Myoview test:  Do not eat or drink after midnight No caffeine for 24 hours prior to  test No smoking 24 hours prior to test. Your medication may be taken with water.  If your doctor stopped a medication because of this test, do not take that medication. Ladies, please do not wear dresses.  Skirts or pants are appropriate. Please wear a short sleeve shirt. No perfume, cologne or lotion. Wear comfortable walking shoes. No heels!     Follow-Up: At [facility], you and your health needs are our priority.  As part of our continuing mission to provide you with exceptional heart care, we have created designated Provider Care Teams.  These Care Teams include your primary Cardiologist (physician) and Advanced Practice Providers (APPs -  Physician Assistants and Nurse Practitioners) who all work together to provide you with the care you need, when you need it.   Your next appointment:   6 month(s)  The format for your next appointment:   In Person  Provider:   Tereso Newcomer PA-C

## 2021-01-10 NOTE — Progress Notes (Signed)
Cardiology Office Note   Date:  01/10/2021   ID:  Caitlyn [name], DOB [birth year], MRN [id number]  PCP:  Loyola Mast, MD  Cardiologist: Dr. Clifton James  Chief Complaint  Patient presents with   Other    CAD pt needing clearance for hip total replacement. Meds reviewed verbally with pt.      History of Present Illness: Caitlyn [name] is a 70 y.o. female who presents for preoperative cardiovascular evaluation for left hip surgery under spinal anesthesia. She has known history of coronary artery disease status post non-STEMI in April 2011 treated with 2 drug-eluting stent placement to the right coronary artery, hyperlipidemia, sleep apnea on CPAP, mild COPD, previous tobacco use essential hypertension, hypothyroidism, carotid disease and chronic diastolic heart failure.  She has known history of mild peripheral arterial disease.  She also has history of Graves' disease status post thyroid ablation. She used to be followed in our group by Dr. Clifton James but was most recently seen in 2015.  She was subsequently followed by Dr. Sharyn Lull.  Repeat cardiac catheterization was done in 2017 which showed 99% stenosis in the proximal right coronary artery and mild disease affecting the LAD and left circumflex.  She underwent an additional drug-eluting stent placement to the right coronary artery overlapping the previously placed stent.  The patient lives in [place] but was added to my schedule for preop cardiovascular evaluation due to availability.  She reports exertional dyspnea without chest pain.  Her functional capacity is limited by significant arthritis.  She takes her medications regularly.  She has history of myalgia with higher doses of statins and currently takes rosuvastatin 10 mg daily.  Past Medical History:  Diagnosis Date   Anxiety    Arthritis    "legs" (01/07/2016)   Back pain    Back pain    CAD 06/2009   a.  s/p MI 4/11: tx with DES x 2 to RCA;  b.  LHC 03/17/11: LAD  30-40%, distal LAD 30-40%, OM2 40-50%, RCA stent patent, EF greater than 70%.;  c.  Lexiscan Myoview (12/15):  Mild apical thinning, no ischemia, EF 61%; normal study   CAROTID STENOSIS    carotid Dopplers 03/25/11: RICA 60-79%; LICA 0-39%.  Follow up recommended in 6 months.   Constipation    Constipation    COPD (chronic obstructive pulmonary disease) (HCC)    Depression    Gout    "on RX for it" (01/07/2016)   Heart disease    History of heart attack    Hx of blood clots    HYPERLIPIDEMIA    HYPERTENSION    HYPOTHYROIDISM    post ablation tx Graves   Hypothyroidism    Joint pain    Myocardial infarction [facility]) 2012   "mini"   OSA on CPAP    Osteoarthritis    Pre-diabetes    PVD    Rheumatoid arthritis (HCC)    Sleep apnea    SOBOE (shortness of breath on exertion)    TOBACCO ABUSE quit 06/2009   Varicose veins    VITAMIN D DEFICIENCY    DEXA 04/2009 normal    Past Surgical History:  Procedure Laterality Date   CARDIAC CATHETERIZATION N/A 01/07/2016   Procedure: Left Heart Cath and Coronary Angiography;  Surgeon: Rinaldo Cloud, MD;  Location: [facility] INVASIVE CV LAB;  Service: Cardiovascular;  Laterality: N/A;   CARDIAC CATHETERIZATION N/A 01/07/2016   Procedure: Coronary Stent Intervention;  Surgeon: Rinaldo Cloud, MD;  Location: [facility] INVASIVE  CV LAB;  Service: Cardiovascular;  Laterality: N/A;   CAROTID STENT Left    bilateral carotid artery disease post stent of left carotid   CESAREAN SECTION  1985   "twins"   CORONARY ANGIOPLASTY     LEFT HEART CATHETERIZATION WITH CORONARY ANGIOGRAM N/A 03/17/2011   Procedure: LEFT HEART CATHETERIZATION WITH CORONARY ANGIOGRAM;  Surgeon: Herby Abraham, MD;  Location: [facility] CATH LAB;  Service: Cardiovascular;  Laterality: N/A;   PERIPHERAL VASCULAR CATHETERIZATION N/A 04/06/2015   Procedure: Abdominal Aortogram;  Surgeon: Sherren Kerns, MD;  Location: [facility] INVASIVE CV LAB;  Service: Cardiovascular;  Laterality: N/A;   RADIOACTIVE PLAQUE  INSERTION     Graves disease post radioactive treatment complicated hypothyroidism     Current Outpatient Medications  Medication Sig Dispense Refill   allopurinol (ZYLOPRIM) 300 MG tablet Take 300 mg by mouth daily.     carvedilol (COREG) 6.25 MG tablet Take 1 tablet (6.25 mg total) by mouth 2 (two) times daily with a meal. 180 tablet 1   Cholecalciferol (VITAMIN D) 50 MCG (2000 UT) tablet Take 1 tablet (2,000 Units total) by mouth daily.     colchicine 0.6 MG tablet Take 0.6 mg by mouth daily as needed (for flare ups). Flare ups     furosemide (LASIX) 40 MG tablet Take 40 mg by mouth daily.     levothyroxine (SYNTHROID, LEVOTHROID) 125 MCG tablet Take 125 mcg by mouth daily before breakfast.     LINZESS 145 MCG CAPS capsule Take 1 capsule (145 mcg total) by mouth daily. 30 capsule 0   losartan (COZAAR) 50 MG tablet Take 1 tablet (50 mg total) by mouth daily. 30 tablet 0   nitroGLYCERIN (NITROSTAT) 0.4 MG SL tablet      potassium chloride (KLOR-CON) 10 MEQ tablet Take 1 tablet (10 mEq total) by mouth daily as needed. Taking 1 tablet when taking Lasix 30 tablet 0   rosuvastatin (CRESTOR) 10 MG tablet One tab po every other night b-4 bed     Semaglutide, 1 MG/DOSE, 4 MG/3ML SOPN Inject 1 mg into the skin every 7 (seven) days. 1 mg wkly 3 mL 0   Vitamin D, Ergocalciferol, (DRISDOL) 1.25 MG (50000 UNIT) CAPS capsule 1 PO Q 5 DAYS 6 capsule 0   zinc gluconate 50 MG tablet Take 50 mg by mouth daily.     No current facility-administered medications for this visit.    Allergies:   Amlodipine, Lisinopril, and Statins    Social History:  The patient  reports that she quit smoking about 11 years ago. Her smoking use included cigarettes. She has a 35.00 pack-year smoking history. She has never used smokeless tobacco. She reports current alcohol use of about 2.0 standard drinks per week. She reports that she does not use drugs.   Family History:  The patient's family history includes Breast cancer  in her paternal aunt; Cancer in her father and mother; Coronary artery disease in an other family member; Depression in her mother; Heart attack in her mother; Heart disease in her father and mother; Hypertension in her father, mother, sister, and another family member; Stroke in her mother; Sudden death in her mother; Thyroid disease in her mother; Varicose Veins in her mother.    ROS:  Please see the history of present illness.   Otherwise, review of systems are positive for none.   All other systems are reviewed and negative.    PHYSICAL EXAM: VS:  BP 140/80 (BP Location: Right Arm, Patient Position:  Sitting, Cuff Size: Large)   Pulse (!) 57   Ht 5\' 5"  (1.651 m)   Wt 241 lb 4 oz (109.4 kg)   SpO2 98%   BMI 40.15 kg/m  , BMI Body mass index is 40.15 kg/m. GEN: Well nourished, well developed, in no acute distress  HEENT: normal  Neck: no JVD, carotid bruits, or masses Cardiac: RRR; no rubs, or gallops,no edema .  1 out of 6 systolic murmur in the aortic area Respiratory:  clear to auscultation bilaterally, normal work of breathing GI: soft, nontender, nondistended, + BS MS: no deformity or atrophy  Skin: warm and dry, no rash Neuro:  Strength and sensation are intact Psych: euthymic mood, full affect   EKG:  EKG is ordered today. The ekg ordered today demonstrates sinus bradycardia with first-degree AV block, possible left atrial enlargement and poor R wave progression in the precordial leads.   Recent Labs: 05/03/2020: Hemoglobin 14.4; Platelets 232 11/21/2020: ALT 13; BUN 25; Creatinine, Ser 0.91; Potassium 4.6; Sodium 141; TSH 1.080    Lipid Panel    Component Value Date/Time   CHOL 173 05/03/2020 0933   TRIG 96 05/03/2020 0933   TRIG 114 02/04/2010 0000   HDL 58 05/03/2020 0933   CHOLHDL 3.0 05/03/2020 0933   CHOLHDL 4.4 06/29/2017 0840   VLDL 41 (H) 06/29/2017 0840   LDLCALC 98 05/03/2020 0933   LDLDIRECT 156.4 08/02/2012 1713      Wt Readings from Last 3  Encounters:  01/10/21 241 lb 4 oz (109.4 kg)  01/09/21 245 lb 12.8 oz (111.5 kg)  01/02/21 240 lb (108.9 kg)      Other studies Reviewed: Additional studies/ records that were reviewed today include: Previous cardiac catheterization and previous peripheral angiogram. Review of the above records demonstrates: Stenting of the right coronary artery.  Mild PAD not requiring revascularization  PAD Screen 01/10/2021  Previous PAD dx? No  Previous surgical procedure? Yes  Pain with walking? Yes  Subsides with rest? No  Feet/toe relief with dangling? No  Painful, non-healing ulcers? No  Extremities discolored? No      ASSESSMENT AND PLAN:  1.  Coronary artery disease involving native coronary arteries with other forms of angina: She reports exertional dyspnea without chest pain and her functional capacity is reduced due to significant arthritis.  She is here today for preoperative cardiovascular evaluation before hip surgery.  Given symptoms and reduced functional capacity in addition to an abnormal EKG, I requested a Lexiscan Myoview for evaluation of risk stratification. I asked her to discontinue clopidogrel and start aspirin 81 mg once daily.  This should not be stopped for hip surgery.  2.  Essential hypertension: Blood pressure is reasonably controlled on current medications.  She is mildly bradycardic but asymptomatic.  Continue carvedilol.  3.  Hyperlipidemia: She reports intolerance to higher doses of statins and currently on rosuvastatin 10 mg daily with an LDL in the 90s.  We should consider adding Zetia in the future.  4.  Carotid artery disease: Followed by [facility].   5.  Chronic diastolic heart failure: She appears to be euvolemic.    Disposition: The patient lives in [place] but she came to [place] due to availability.  Schedule follow-up appointment with [place] in 6 months and the patient will reestablish with Dr. Tereso Newcomer.  Signed,  Clifton James, MD   01/10/2021 3:40 PM    [facility]

## 2021-01-11 NOTE — Addendum Note (Signed)
Addended by: Bryna Colander on: 01/11/2021 08:56 AM   Modules accepted: Orders

## 2021-01-14 ENCOUNTER — Telehealth (HOSPITAL_COMMUNITY): Payer: Self-pay

## 2021-01-14 NOTE — Progress Notes (Signed)
DUE TO COVID-19 ONLY ONE VISITOR IS ALLOWED TO COME WITH YOU AND STAY IN THE WAITING ROOM ONLY DURING PRE OP AND PROCEDURE DAY OF SURGERY.  2 VISITOR  MAY VISIT WITH YOU AFTER SURGERY IN YOUR PRIVATE ROOM DURING VISITING HOURS ONLY!  YOU NEED TO HAVE A COVID 19 TEST ON___11/06/2020 @_  @_from  8am-3pm _____, THIS TEST MUST BE DONE BEFORE SURGERY,  Covid test is done at [address] [name], [address].  This is a drive thru.  No appt required. Please see map.                 Your procedure is scheduled on:  01/22/2021   Report to [facility] Main  Entrance   Report to admitting at    [phone]     Call this number if you have problems the morning of surgery [phone]    REMEMBER: NO  SOLID FOOD CANDY OR GUM AFTER MIDNIGHT. CLEAR LIQUIDS UNTIL 0900am         . NOTHING BY MOUTH EXCEPT CLEAR LIQUIDS UNTIL  0900am   . PLEASE FINISH ENSURE DRINK PER SURGEON ORDER  WHICH NEEDS TO BE COMPLETED AT   0900am    .      CLEAR LIQUID DIET   Foods Allowed                                                                    Coffee and tea, regular and decaf                            Fruit ices (not with fruit pulp)                                      Iced Popsicles                                    Carbonated beverages, regular and diet                                    Cranberry, grape and apple juices Sports drinks like Gatorade Lightly seasoned clear broth or consume(fat free) Sugar, honey syrup ___________________________________________________________________      BRUSH YOUR TEETH MORNING OF SURGERY AND RINSE YOUR MOUTH OUT, NO CHEWING GUM CANDY OR MINTS.     Take these medicines the morning of surgery with A SIP OF WATER:  allopurinol, coreg, synthroid  DO NOT TAKE ANY DIABETIC MEDICATIONS DAY OF YOUR SURGERY                               You may not have any metal on your body including hair pins and              piercings  Do not wear jewelry, make-up,  lotions, powders or perfumes, deodorant             Do not wear  nail polish on your fingernails.  Do not shave  48 hours prior to surgery.              Men may shave face and neck.   Do not bring valuables to the hospital. [place].  Contacts, dentures or bridgework may not be worn into surgery.  Leave suitcase in the car. After surgery it may be brought to your room.     Patients discharged the day of surgery will not be allowed to drive home. IF YOU ARE HAVING SURGERY AND GOING HOME THE SAME DAY, YOU MUST HAVE AN ADULT TO DRIVE YOU HOME AND BE WITH YOU FOR 24 HOURS. YOU MAY GO HOME BY TAXI OR UBER OR ORTHERWISE, BUT AN ADULT MUST ACCOMPANY YOU HOME AND STAY WITH YOU FOR 24 HOURS.  Name and phone number of your driver:  Special Instructions: N/A              Please read over the following fact sheets you were given: _____________________________________________________________________  [facility] - Preparing for Surgery Before surgery, you can play an important role.  Because skin is not sterile, your skin needs to be as free of germs as possible.  You can reduce the number of germs on your skin by washing with CHG (chlorahexidine gluconate) soap before surgery.  CHG is an antiseptic cleaner which kills germs and bonds with the skin to continue killing germs even after washing. Please DO NOT use if you have an allergy to CHG or antibacterial soaps.  If your skin becomes reddened/irritated stop using the CHG and inform your nurse when you arrive at Short Stay. Do not shave (including legs and underarms) for at least 48 hours prior to the first CHG shower.  You may shave your face/neck. Please follow these instructions carefully:  1.  Shower with CHG Soap the night before surgery and the  morning of Surgery.  2.  If you choose to wash your hair, wash your hair first as usual with your  normal  shampoo.  3.  After you shampoo, rinse your hair  and body thoroughly to remove the  shampoo.                           4.  Use CHG as you would any other liquid soap.  You can apply chg directly  to the skin and wash                       Gently with a scrungie or clean washcloth.  5.  Apply the CHG Soap to your body ONLY FROM THE NECK DOWN.   Do not use on face/ open                           Wound or open sores. Avoid contact with eyes, ears mouth and genitals (private parts).                       Wash face,  Genitals (private parts) with your normal soap.             6.  Wash thoroughly, paying special attention to the area where your surgery  will be performed.  7.  Thoroughly rinse your body with warm water from  the neck down.  8.  DO NOT shower/wash with your normal soap after using and rinsing off  the CHG Soap.                9.  Pat yourself dry with a clean towel.            10.  Wear clean pajamas.            11.  Place clean sheets on your bed the night of your first shower and do not  sleep with pets. Day of Surgery : Do not apply any lotions/deodorants the morning of surgery.  Please wear clean clothes to the hospital/surgery center.  FAILURE TO FOLLOW THESE INSTRUCTIONS MAY RESULT IN THE CANCELLATION OF YOUR SURGERY PATIENT SIGNATURE_________________________________  NURSE SIGNATURE__________________________________  ________________________________________________________________________

## 2021-01-14 NOTE — Progress Notes (Addendum)
Anesthesia Review:  PCP: Dr Herbie Drape  LOV 01/09/21  Cardiologist : hx of being followed by DR Sharyn Lull most recent seen by Dr Kirke Corin 01/10/21  - LOV  Pulmonology- - DR Jetty Duhamel LOV 10/29/20  Chest x-ray : 10/30/20 - 2V  and 01/16/2021  EKG : 04/28/20  also on 01/10/21 - called DR Kirke Corin office and they are to scan into epic- done  12/19/20- Sleep Study  Carotids- 12/31/20  Echo : Stress test: 01/15/2021  Cardiac Cath :  Activity level: can do a flight of stairs without difficulty  Sleep Study/ CPAP : has cpap  Fasting Blood Sugar :      / Checks Blood Sugar -- times a day:   Blood Thinner/ Instructions /Last Dose: ASA / Instructions/ Last Dose :   11/21/20- hgba1c- 5.5   01/16/21-hgba1c-5.4 DM- PREDIABETES PER PT does not check glucose at home  COVID TEST ON 01/18/21  PT is prediabetic.  Ate llunch at home prior to preop appt.  Glucose was 71 at preop.  PT voices no complaints.  PT given peanut butter crackers and water.  U/A done 01/16/21 routed to DR [facility].

## 2021-01-14 NOTE — Telephone Encounter (Signed)
Spoke with the patient, detailed instructions given. She stated that she will be here for her test. Asked to call back with any questions. S.Dejane Scheibe EMTP 

## 2021-01-15 ENCOUNTER — Other Ambulatory Visit: Payer: Self-pay

## 2021-01-15 ENCOUNTER — Ambulatory Visit (HOSPITAL_COMMUNITY): Payer: Medicare Other | Attending: Internal Medicine

## 2021-01-15 DIAGNOSIS — R0602 Shortness of breath: Secondary | ICD-10-CM | POA: Diagnosis present

## 2021-01-15 DIAGNOSIS — Z01810 Encounter for preprocedural cardiovascular examination: Secondary | ICD-10-CM | POA: Diagnosis not present

## 2021-01-15 LAB — MYOCARDIAL PERFUSION IMAGING
Base ST Depression (mm): 0 mm
LV dias vol: 82 mL (ref 46–106)
LV sys vol: 39 mL
Nuc Stress EF: 52 %
Peak HR: 69 {beats}/min
Rest HR: 59 {beats}/min
Rest Nuclear Isotope Dose: 10.2 mCi
SDS: 1
SRS: 0
SSS: 1
ST Depression (mm): 0 mm
Stress Nuclear Isotope Dose: 31.2 mCi
TID: 0.94

## 2021-01-15 MED ORDER — TECHNETIUM TC 99M TETROFOSMIN IV KIT
10.2000 | PACK | Freq: Once | INTRAVENOUS | Status: AC | PRN
  Administered 2021-01-15: 10.2 via INTRAVENOUS
  Filled 2021-01-15: qty 11

## 2021-01-15 MED ORDER — REGADENOSON 0.4 MG/5ML IV SOLN
0.4000 mg | Freq: Once | INTRAVENOUS | Status: AC
  Administered 2021-01-15: 0.4 mg via INTRAVENOUS

## 2021-01-15 MED ORDER — TECHNETIUM TC 99M TETROFOSMIN IV KIT
31.2000 | PACK | Freq: Once | INTRAVENOUS | Status: AC | PRN
  Administered 2021-01-15: 31.2 via INTRAVENOUS
  Filled 2021-01-15: qty 32

## 2021-01-15 NOTE — H&P (Signed)
TOTAL HIP ADMISSION H&P  Patient is admitted for left total hip arthroplasty.  Subjective:  Chief Complaint: left hip pain  HPI: Caitlyn [name], 70 y.o. female, has a history of pain and functional disability in the left hip(s) due to arthritis and patient has failed non-surgical conservative treatments for greater than 12 weeks to include NSAID's and/or analgesics, flexibility and strengthening excercises, supervised PT with diminished ADL's post treatment, use of assistive devices, weight reduction as appropriate, and activity modification.  Onset of symptoms was gradual starting 5 years ago with gradually worsening course since that time.The patient noted no past surgery on the left hip(s).  Patient currently rates pain in the left hip at 10 out of 10 with activity. Patient has night pain, worsening of pain with activity and weight bearing, trendelenberg gait, pain that interfers with activities of daily living, and crepitus. Patient has evidence of subchondral cysts, subchondral sclerosis, periarticular osteophytes, and joint space narrowing by imaging studies. This condition presents safety issues increasing the risk of falls.  There is no current active infection.  Patient Active Problem List   Diagnosis Date Noted   History of coronary angioplasty with insertion of stent 01/09/2021   COPD mixed type (HCC)    Chronic constipation 07/12/2020   Essential hypertension 05/21/2020   Seasonal and perennial allergic rhinitis 02/17/2020   Obstructive sleep apnea 10/14/2019   Insomnia 10/14/2019   Hx of Graves' disease 04/19/2019   Chronic midline low back pain without sciatica 04/02/2019   History of DVT (deep vein thrombosis) 03/30/2019   COVID-19 virus infection 09/08/2018   Class 3 severe obesity with serious comorbidity and body mass index (BMI) of 40.0 to 44.9 in adult [facility]) 02/23/2018   Prediabetes 01/13/2018   Upper airway cough syndrome 05/29/2016   Multiple pulmonary nodules  05/28/2016   Acute coronary syndrome (HCC) 01/06/2016   Edema 03/16/2013   Gout 03/16/2013   Arthralgia of ankle 03/16/2013   Depression    Leg pain 07/31/2011   Vitamin D deficiency 03/15/2010   Carotid stenosis 07/24/2009   Hyperlipidemia 07/23/2009   CAD (coronary artery disease) 07/23/2009   Peripheral vascular disease (HCC) 07/23/2009   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  s/p MI 4/11: tx with DES x 2 to RCA;  b.  LHC 03/17/11: LAD 30-40%, distal LAD 30-40%, OM2 40-50%, RCA stent patent, EF greater than 70%.;  c.  Lexiscan Myoview (12/15):  Mild apical thinning, no ischemia, EF 61%; normal study   CAROTID STENOSIS    carotid Dopplers 03/25/11: RICA 60-79%; LICA 0-39%.  Follow up recommended in 6 months.   Constipation    Constipation    COPD (chronic obstructive pulmonary disease) (HCC)    Depression    Gout    "on RX for it" (01/07/2016)   Heart disease    History of heart attack    Hx of blood clots    HYPERLIPIDEMIA    HYPERTENSION    HYPOTHYROIDISM    post ablation tx Graves   Hypothyroidism    Joint pain    Myocardial infarction [facility]) 2012   "mini"   OSA on CPAP    Osteoarthritis    Pre-diabetes    PVD    Rheumatoid arthritis (HCC)    Sleep apnea    SOBOE (shortness of  breath on exertion)    TOBACCO ABUSE quit 06/2009   Varicose veins    VITAMIN D DEFICIENCY    DEXA 04/2009 normal    Past Surgical History:  Procedure Laterality Date   CARDIAC CATHETERIZATION N/A 01/07/2016   Procedure: Left Heart Cath and Coronary Angiography;  Surgeon: Charolette Forward, MD;  Location: [place] CV LAB;  Service: Cardiovascular;  Laterality: N/A;   CARDIAC CATHETERIZATION N/A 01/07/2016   Procedure: Coronary Stent Intervention;  Surgeon: Charolette Forward, MD;  Location: [place] CV LAB;  Service: Cardiovascular;  Laterality: N/A;    CAROTID STENT Left    bilateral carotid artery disease post stent of left carotid   CESAREAN SECTION  1985   "twins"   CORONARY ANGIOPLASTY     LEFT HEART CATHETERIZATION WITH CORONARY ANGIOGRAM N/A 03/17/2011   Procedure: LEFT HEART CATHETERIZATION WITH CORONARY ANGIOGRAM;  Surgeon: Hillary Bow, MD;  Location: [facility] CATH LAB;  Service: Cardiovascular;  Laterality: N/A;   PERIPHERAL VASCULAR CATHETERIZATION N/A 04/06/2015   Procedure: Abdominal Aortogram;  Surgeon: Elam Dutch, MD;  Location: [place] CV LAB;  Service: Cardiovascular;  Laterality: N/A;   RADIOACTIVE PLAQUE INSERTION     Graves disease post radioactive treatment complicated hypothyroidism    No current facility-administered medications for this encounter.   Current Outpatient Medications  Medication Sig Dispense Refill Last Dose   allopurinol (ZYLOPRIM) 300 MG tablet Take 300 mg by mouth daily.      aspirin EC 81 MG tablet Take 1 tablet (81 mg total) by mouth daily. Swallow whole. 90 tablet 3    carvedilol (COREG) 6.25 MG tablet Take 1 tablet (6.25 mg total) by mouth 2 (two) times daily with a meal. 180 tablet 1    Cholecalciferol (VITAMIN D) 50 MCG (2000 UT) tablet Take 1 tablet (2,000 Units total) by mouth daily.      colchicine 0.6 MG tablet Take 1.2 mg by mouth daily as needed (Gout flare ups).      furosemide (LASIX) 40 MG tablet Take 40 mg by mouth daily.      levothyroxine (SYNTHROID, LEVOTHROID) 125 MCG tablet Take 125 mcg by mouth daily before breakfast.      LINZESS 145 MCG CAPS capsule Take 1 capsule (145 mcg total) by mouth daily. 30 capsule 0    losartan (COZAAR) 50 MG tablet Take 1 tablet (50 mg total) by mouth daily. 30 tablet 0    potassium chloride (KLOR-CON) 10 MEQ tablet Take 1 tablet (10 mEq total) by mouth daily as needed. Taking 1 tablet when taking Lasix (Patient taking differently: Take 10 mEq by mouth daily.) 30 tablet 0    rosuvastatin (CRESTOR) 10 MG tablet One tab po every other night b-4  bed (Patient taking differently: Take 10 mg by mouth every other day. Every other day at bedtime)      Selenium (SELENICAPS-200) 200 MCG CAPS Take 200 mcg by mouth daily.      Semaglutide, 1 MG/DOSE, 4 MG/3ML SOPN Inject 1 mg into the skin every 7 (seven) days. 1 mg wkly 3 mL 0    Vitamin D, Ergocalciferol, (DRISDOL) 1.25 MG (50000 UNIT) CAPS capsule 1 PO Q 5 DAYS (Patient taking differently: Take 50,000 Units by mouth every 7 (seven) days.) 6 capsule 0    zinc gluconate 50 MG tablet Take 50 mg by mouth daily.      nitroGLYCERIN (NITROSTAT) 0.4 MG SL tablet Place 0.4 mg under the tongue every 5 (five) minutes as needed for chest  pain.      Allergies  Allergen Reactions   Amlodipine Swelling   Lisinopril Cough   Statins Other (See Comments)    Myalgia    Social History   Tobacco Use   Smoking status: Former    Packs/day: 1.00    Years: 35.00    Pack years: 35.00    Types: Cigarettes    Quit date: 06/30/2009    Years since quitting: 11.5   Smokeless tobacco: Never  Substance Use Topics   Alcohol use: Yes    Alcohol/week: 2.0 standard drinks    Types: 2 Glasses of wine per week    Comment: occ    Family History  Problem Relation Age of Onset   Heart attack Mother    Stroke Mother    Cancer Mother    Varicose Veins Mother    Thyroid disease Mother    Hypertension Mother    Heart disease Mother    Sudden death Mother    Depression Mother    Cancer Father    Heart disease Father    Hypertension Father    Coronary artery disease Other    Hypertension Other    Hypertension Sister    Breast cancer Paternal Aunt      Review of Systems  Musculoskeletal:  Positive for arthralgias.       Left hip  All other systems reviewed and are negative.  Objective:  Physical Exam Constitutional:      Appearance: Normal appearance.  HENT:     Head: Normocephalic and atraumatic.     Nose: Nose normal.     Mouth/Throat:     Pharynx: Oropharynx is clear.  Eyes:     Extraocular  Movements: Extraocular movements intact.  Cardiovascular:     Rate and Rhythm: Normal rate.  Pulmonary:     Effort: Pulmonary effort is normal.  Abdominal:     Palpations: Abdomen is soft.  Musculoskeletal:     Cervical back: Normal range of motion.     Comments: Examination left hip shows decreased range of motion in comparison to the right.  She has pain at end internal range of motion which is sharp.  She walks with an altered gait  Skin:    General: Skin is warm and dry.  Neurological:     General: No focal deficit present.     Mental Status: She is alert and oriented to person, place, and time.  Psychiatric:        Mood and Affect: Mood normal.        Behavior: Behavior normal.        Thought Content: Thought content normal.        Judgment: Judgment normal.    Vital signs in last 24 hours:    Labs:   Estimated body mass index is 40.1 kg/m as calculated from the following:   Height as of 01/15/21: 5\' 5"  (1.651 m).   Weight as of 01/15/21: 109.3 kg.   Imaging Review Plain radiographs demonstrate severe degenerative joint disease of the left hip(s). The bone quality appears to be good for age and reported activity level.    Assessment/Plan:  End stage primary arthritis, left hip(s)  The patient history, physical examination, clinical judgement of the provider and imaging studies are consistent with end stage degenerative joint disease of the left hip(s) and total hip arthroplasty is deemed medically necessary. The treatment options including medical management, injection therapy, arthroscopy and arthroplasty were discussed at length. The risks and  benefits of total hip arthroplasty were presented and reviewed. The risks due to aseptic loosening, infection, stiffness, dislocation/subluxation,  thromboembolic complications and other imponderables were discussed.  The patient acknowledged the explanation, agreed to proceed with the plan and consent was signed. Patient is  being admitted for inpatient treatment for surgery, pain control, PT, OT, prophylactic antibiotics, VTE prophylaxis, progressive ambulation and ADL's and discharge planning.The patient is planning to be discharged home with home health services

## 2021-01-16 ENCOUNTER — Ambulatory Visit (INDEPENDENT_AMBULATORY_CARE_PROVIDER_SITE_OTHER): Payer: Medicare Other | Admitting: Family Medicine

## 2021-01-16 ENCOUNTER — Encounter (HOSPITAL_COMMUNITY)
Admission: RE | Admit: 2021-01-16 | Discharge: 2021-01-16 | Disposition: A | Discharge status: Home / Self Care | Payer: Medicare Other | Source: Ambulatory Visit | Attending: Orthopaedic Surgery | Admitting: Orthopaedic Surgery

## 2021-01-16 ENCOUNTER — Encounter (HOSPITAL_COMMUNITY): Payer: Self-pay

## 2021-01-16 ENCOUNTER — Other Ambulatory Visit: Payer: Self-pay

## 2021-01-16 ENCOUNTER — Ambulatory Visit (HOSPITAL_COMMUNITY)
Admission: RE | Admit: 2021-01-16 | Discharge: 2021-01-16 | Disposition: A | Discharge status: Home / Self Care | Payer: Medicare Other | Source: Ambulatory Visit | Attending: Orthopaedic Surgery | Admitting: Orthopaedic Surgery

## 2021-01-16 VITALS — BP 150/70 | HR 58 | Temp 98.4°F | Resp 16 | Ht 65.0 in | Wt 242.0 lb

## 2021-01-16 DIAGNOSIS — M1612 Unilateral primary osteoarthritis, left hip: Secondary | ICD-10-CM | POA: Insufficient documentation

## 2021-01-16 DIAGNOSIS — I10 Essential (primary) hypertension: Secondary | ICD-10-CM | POA: Insufficient documentation

## 2021-01-16 DIAGNOSIS — R7303 Prediabetes: Secondary | ICD-10-CM

## 2021-01-16 DIAGNOSIS — Z01818 Encounter for other preprocedural examination: Secondary | ICD-10-CM | POA: Diagnosis present

## 2021-01-16 DIAGNOSIS — I6529 Occlusion and stenosis of unspecified carotid artery: Secondary | ICD-10-CM | POA: Insufficient documentation

## 2021-01-16 DIAGNOSIS — E039 Hypothyroidism, unspecified: Secondary | ICD-10-CM | POA: Insufficient documentation

## 2021-01-16 DIAGNOSIS — Z7902 Long term (current) use of antithrombotics/antiplatelets: Secondary | ICD-10-CM | POA: Diagnosis not present

## 2021-01-16 DIAGNOSIS — I252 Old myocardial infarction: Secondary | ICD-10-CM | POA: Diagnosis not present

## 2021-01-16 DIAGNOSIS — Z955 Presence of coronary angioplasty implant and graft: Secondary | ICD-10-CM | POA: Insufficient documentation

## 2021-01-16 DIAGNOSIS — I2510 Atherosclerotic heart disease of native coronary artery without angina pectoris: Secondary | ICD-10-CM | POA: Insufficient documentation

## 2021-01-16 DIAGNOSIS — R9431 Abnormal electrocardiogram [ECG] [EKG]: Secondary | ICD-10-CM | POA: Diagnosis not present

## 2021-01-16 DIAGNOSIS — G4733 Obstructive sleep apnea (adult) (pediatric): Secondary | ICD-10-CM | POA: Diagnosis not present

## 2021-01-16 LAB — CBC WITH DIFFERENTIAL/PLATELET
Abs Immature Granulocytes: 0.02 10*3/uL (ref 0.00–0.07)
Basophils Absolute: 0 10*3/uL (ref 0.0–0.1)
Basophils Relative: 0 %
Eosinophils Absolute: 0.1 10*3/uL (ref 0.0–0.5)
Eosinophils Relative: 2 %
HCT: 42.8 % (ref 36.0–46.0)
Hemoglobin: 14 g/dL (ref 12.0–15.0)
Immature Granulocytes: 0 %
Lymphocytes Relative: 30 %
Lymphs Abs: 2.2 10*3/uL (ref 0.7–4.0)
MCH: 31.9 pg (ref 26.0–34.0)
MCHC: 32.7 g/dL (ref 30.0–36.0)
MCV: 97.5 fL (ref 80.0–100.0)
Monocytes Absolute: 0.5 10*3/uL (ref 0.1–1.0)
Monocytes Relative: 6 %
Neutro Abs: 4.5 10*3/uL (ref 1.7–7.7)
Neutrophils Relative %: 62 %
Platelets: 219 10*3/uL (ref 150–400)
RBC: 4.39 MIL/uL (ref 3.87–5.11)
RDW: 14.5 % (ref 11.5–15.5)
WBC: 7.3 10*3/uL (ref 4.0–10.5)
nRBC: 0 % (ref 0.0–0.2)

## 2021-01-16 LAB — URINALYSIS, ROUTINE W REFLEX MICROSCOPIC
Bilirubin Urine: NEGATIVE
Glucose, UA: NEGATIVE mg/dL
Hgb urine dipstick: NEGATIVE
Ketones, ur: NEGATIVE mg/dL
Nitrite: NEGATIVE
Protein, ur: NEGATIVE mg/dL
Specific Gravity, Urine: 1.017 (ref 1.005–1.030)
pH: 5 (ref 5.0–8.0)

## 2021-01-16 LAB — SURGICAL PCR SCREEN
MRSA, PCR: NEGATIVE
Staphylococcus aureus: NEGATIVE

## 2021-01-16 LAB — HEMOGLOBIN A1C
Hgb A1c MFr Bld: 5.4 % (ref 4.8–5.6)
Mean Plasma Glucose: 108.28 mg/dL

## 2021-01-16 LAB — APTT: aPTT: 29 seconds (ref 24–36)

## 2021-01-16 LAB — BASIC METABOLIC PANEL
Anion gap: 3 — ABNORMAL LOW (ref 5–15)
BUN: 27 mg/dL — ABNORMAL HIGH (ref 8–23)
CO2: 28 mmol/L (ref 22–32)
Calcium: 9.2 mg/dL (ref 8.9–10.3)
Chloride: 106 mmol/L (ref 98–111)
Creatinine, Ser: 0.89 mg/dL (ref 0.44–1.00)
GFR, Estimated: >60 mL/min (ref >60)
Glucose, Bld: 87 mg/dL (ref 70–99)
Potassium: 3.8 mmol/L (ref 3.5–5.1)
Sodium: 137 mmol/L (ref 135–145)

## 2021-01-16 LAB — GLUCOSE, CAPILLARY: Glucose-Capillary: 71 mg/dL (ref 70–99)

## 2021-01-16 LAB — PROTIME-INR
INR: 1.1 (ref 0.8–1.2)
Prothrombin Time: 13.7 seconds (ref 11.4–15.2)

## 2021-01-17 ENCOUNTER — Telehealth: Payer: Self-pay | Admitting: *Deleted

## 2021-01-17 NOTE — Telephone Encounter (Signed)
Left voicemail message to call back for review of results.  

## 2021-01-17 NOTE — Progress Notes (Signed)
Anesthesia Chart Review   Case: [id number] Date/Time: 01/22/21 1145   Procedure: LEFT TOTAL HIP ARTHROPLASTY ANTERIOR APPROACH (Left: Hip)   Anesthesia type: Spinal   Pre-op diagnosis: LEFT HIP DEGENERATIVE JOINT DISEASE   Location: [facility] ROOM 06 / [facility] ORS   Surgeons: Marcene Corning, MD       DISCUSSION:69 y.o. former smoker with h/o HTN, CAD (DES 2011), OSA on CPAP, carotid stenosis, hypothyroidism, left hip djd scheduled for above procedure 01/22/2021 with Dr. Marcene Corning.   Pt seen by cardiology 01/10/2021. Per OV note, "Given symptoms and reduced functional capacity in addition to an abnormal EKG, I requested a Lexiscan Myoview for evaluation of risk stratification. I asked her to discontinue clopidogrel and start aspirin 81 mg once daily.  This should not be stopped for hip surgery."  Stress Test 01/15/21 negative for ischemia.   Anticipate pt can proceed with planned procedure barring acute status change.   VS: BP (!) 150/70   Pulse (!) 58   Temp 36.9 C (Oral)   Resp 16   Ht 5\' 5"  (1.651 m)   Wt 109.8 kg   SpO2 100%   BMI 40.27 kg/m   PROVIDERS: , MD is PCP   Loyola Mast, MD is Cardiologist LABS: Labs reviewed: Acceptable for surgery. (all labs ordered are listed, but only abnormal results are displayed)  Labs Reviewed  BASIC METABOLIC PANEL - Abnormal; Notable for the following components:      Result Value   BUN 27 (*)    Anion gap 3 (*)    All other components within normal limits  URINALYSIS, ROUTINE W REFLEX MICROSCOPIC - Abnormal; Notable for the following components:   APPearance HAZY (*)    Leukocytes,Ua LARGE (*)    Bacteria, UA MANY (*)    All other components within normal limits  SURGICAL PCR SCREEN  HEMOGLOBIN A1C  CBC WITH DIFFERENTIAL/PLATELET  PROTIME-INR  APTT  GLUCOSE, CAPILLARY  TYPE AND SCREEN     IMAGES:   EKG: 01/10/2021 Rate 57 bpm  Sinus bradycardia with 1st degree AV block  Possible left atrial  enlargement Septal infarct, age undetermined  CV: Stress Test 01/15/2021   Findings are consistent with no prior ischemia and no prior myocardial infarction. The study is low risk.   No ST deviation was noted.   Left ventricular function is normal. Nuclear stress EF: 52 %. The left ventricular ejection fraction is mildly decreased (45-54%). End diastolic cavity size is normal.   Prior study available for comparison from 06/29/2017. No changes compared to prior study.   Findings: Negative for stress induced arrhythmias. Small true apical perfusion defect in rest and stress without wall motion abnormality.  Not consistent with infarct.   Conclusions: Stress test is negative for ischemia.  Echo 07/01/2009 Study Conclusions   Left ventricle: The cavity size was normal. Systolic function was   normal. The estimated ejection fraction was in the range of 55% to   60%. Wall motion was normal; there were no regional wall motion   abnormalities.    Transthoracic echocardiography. M-mode, complete   2D, spectral Doppler, and color Doppler. Height: Height: 170.2cm.   Height: 67in. Weight: Weight: 102kg. Weight: 224.4lb. Body mass   index: BMI: 35.2kg/m^2. Body surface area: BSA: 2.63m^2. Blood   pressure: 130/71. Patient status: Inpatient. Location: Bedside.  Past Medical History:  Diagnosis Date   Anxiety    Arthritis    "legs" (01/07/2016)   Back pain    Back pain  CAD 06/2009   a.  s/p MI 4/11: tx with DES x 2 to RCA;  b.  LHC 03/17/11: LAD 30-40%, distal LAD 30-40%, OM2 40-50%, RCA stent patent, EF greater than 70%.;  c.  Lexiscan Myoview (12/15):  Mild apical thinning, no ischemia, EF 61%; normal study   CAROTID STENOSIS    carotid Dopplers 03/25/11: RICA 60-79%; LICA 0-39%.  Follow up recommended in 6 months.   Constipation    Constipation    Depression    Gout    "on RX for it" (01/07/2016)   Heart disease    History of heart attack    Hx of blood clots    HYPERLIPIDEMIA     HYPERTENSION    HYPOTHYROIDISM    post ablation tx Graves   Hypothyroidism    Joint pain    Myocardial infarction [facility]) 2012   "mini"   OSA on CPAP    Osteoarthritis    Pre-diabetes    PVD    Rheumatoid arthritis (HCC)    Sleep apnea    SOBOE (shortness of breath on exertion)    TOBACCO ABUSE quit 06/2009   Varicose veins    VITAMIN D DEFICIENCY    DEXA 04/2009 normal    Past Surgical History:  Procedure Laterality Date   CARDIAC CATHETERIZATION N/A 01/07/2016   Procedure: Left Heart Cath and Coronary Angiography;  Surgeon: Rinaldo Cloud, MD;  Location: [facility] INVASIVE CV LAB;  Service: Cardiovascular;  Laterality: N/A;   CARDIAC CATHETERIZATION N/A 01/07/2016   Procedure: Coronary Stent Intervention;  Surgeon: Rinaldo Cloud, MD;  Location: [facility] INVASIVE CV LAB;  Service: Cardiovascular;  Laterality: N/A;   CAROTID STENT Left    bilateral carotid artery disease post stent of left carotid   CESAREAN SECTION  1985   "twins"   CORONARY ANGIOPLASTY     LEFT HEART CATHETERIZATION WITH CORONARY ANGIOGRAM N/A 03/17/2011   Procedure: LEFT HEART CATHETERIZATION WITH CORONARY ANGIOGRAM;  Surgeon: Herby Abraham, MD;  Location: [facility] CATH LAB;  Service: Cardiovascular;  Laterality: N/A;   PERIPHERAL VASCULAR CATHETERIZATION N/A 04/06/2015   Procedure: Abdominal Aortogram;  Surgeon: Sherren Kerns, MD;  Location: [facility] INVASIVE CV LAB;  Service: Cardiovascular;  Laterality: N/A;   RADIOACTIVE PLAQUE INSERTION     Graves disease post radioactive treatment complicated hypothyroidism    MEDICATIONS:  allopurinol (ZYLOPRIM) 300 MG tablet   aspirin EC 81 MG tablet   carvedilol (COREG) 6.25 MG tablet   Cholecalciferol (VITAMIN D) 50 MCG (2000 UT) tablet   colchicine 0.6 MG tablet   furosemide (LASIX) 40 MG tablet   levothyroxine (SYNTHROID, LEVOTHROID) 125 MCG tablet   LINZESS 145 MCG CAPS capsule   losartan (COZAAR) 50 MG tablet   nitroGLYCERIN (NITROSTAT) 0.4 MG SL tablet   potassium chloride  (KLOR-CON) 10 MEQ tablet   rosuvastatin (CRESTOR) 10 MG tablet   Selenium (SELENICAPS-200) 200 MCG CAPS   Semaglutide, 1 MG/DOSE, 4 MG/3ML SOPN   Vitamin D, Ergocalciferol, (DRISDOL) 1.25 MG (50000 UNIT) CAPS capsule   zinc gluconate 50 MG tablet   No current facility-administered medications for this encounter.    Jodell Cipro Ward, PA-C [facility] Pre-Surgical Testing [phone]

## 2021-01-17 NOTE — Anesthesia Preprocedure Evaluation (Addendum)
Anesthesia Evaluation  Patient identified by MRN, date of birth, ID band Patient awake    Reviewed: Allergy & Precautions, NPO status , Patient's Chart, lab work & pertinent test results, reviewed documented beta blocker date and time   History of Anesthesia Complications Negative for: history of anesthetic complications  Airway Mallampati: II  TM Distance: >3 FB Neck ROM: Full    Dental  (+) Partial Lower, Partial Upper   Pulmonary sleep apnea and Continuous Positive Airway Pressure Ventilation , COPD, former smoker,    Pulmonary exam normal        Cardiovascular hypertension, Pt. on medications and Pt. on home beta blockers + CAD, + Past MI (2011), + Cardiac Stents (2011), + Peripheral Vascular Disease and + DVT  Normal cardiovascular exam   Stress Test 01/15/21 negative for ischemia.    Neuro/Psych B/L carotid  stenosis    GI/Hepatic negative GI ROS, Neg liver ROS,   Endo/Other  Hypothyroidism Morbid obesity  Renal/GU negative Renal ROS  negative genitourinary   Musculoskeletal  (+) Arthritis , Osteoarthritis and Rheumatoid disorders,    Abdominal   Peds  Hematology Plavix   Anesthesia Other Findings  Pt is unsure of exact Plavix stop date but knows it was at least 1 week ago. She was asked to stop by Cardiology on 10/27  Reproductive/Obstetrics                          Anesthesia Physical Anesthesia Plan  ASA: 3  Anesthesia Plan: Spinal   Post-op Pain Management:    Induction:   PONV Risk Score and Plan: 2 and Propofol infusion, Treatment may vary due to age or medical condition, Ondansetron and TIVA  Airway Management Planned: Nasal Cannula and Simple Face Mask  Additional Equipment: None  Intra-op Plan:   Post-operative Plan:   Informed Consent: I have reviewed the patients History and Physical, chart, labs and discussed the procedure including the risks, benefits and  alternatives for the proposed anesthesia with the patient or authorized representative who has indicated his/her understanding and acceptance.       Plan Discussed with:   Anesthesia Plan Comments: (See PAT note 01/16/21, Jodell Cipro Ward, PA-C)       Anesthesia Quick Evaluation

## 2021-01-17 NOTE — Telephone Encounter (Signed)
-----   Message from Iran Ouch, MD sent at 01/17/2021  1:51 PM EDT ----- Inform patient that  stress test was normal. She is at low risk for surgery

## 2021-01-18 ENCOUNTER — Other Ambulatory Visit: Payer: Self-pay | Admitting: Orthopaedic Surgery

## 2021-01-18 LAB — SARS CORONAVIRUS 2 (TAT 6-24 HRS): SARS Coronavirus 2: NEGATIVE

## 2021-01-18 NOTE — Addendum Note (Signed)
Addended by: Lorine Bears A on: 01/18/2021 02:32 PM   Modules accepted: Orders

## 2021-01-18 NOTE — Telephone Encounter (Signed)
Results reviewed with patient and she had no further questions at this time.  °

## 2021-01-22 ENCOUNTER — Ambulatory Visit (HOSPITAL_COMMUNITY): Payer: Medicare Other

## 2021-01-22 ENCOUNTER — Encounter (HOSPITAL_COMMUNITY): Payer: Self-pay | Admitting: Orthopaedic Surgery

## 2021-01-22 ENCOUNTER — Ambulatory Visit (HOSPITAL_COMMUNITY): Payer: Medicare Other | Admitting: Anesthesiology

## 2021-01-22 ENCOUNTER — Other Ambulatory Visit: Payer: Self-pay

## 2021-01-22 ENCOUNTER — Ambulatory Visit (HOSPITAL_COMMUNITY): Payer: Medicare Other | Admitting: Physician Assistant

## 2021-01-22 ENCOUNTER — Encounter (HOSPITAL_COMMUNITY)
Admission: RE | Disposition: A | Discharge status: Home / Self Care | Payer: Self-pay | Source: Home / Self Care | Attending: Orthopaedic Surgery

## 2021-01-22 ENCOUNTER — Observation Stay (HOSPITAL_COMMUNITY)
Admission: RE | Admit: 2021-01-22 | Discharge: 2021-01-23 | Disposition: A | Discharge status: Home / Self Care | Payer: Medicare Other | Attending: Orthopaedic Surgery | Admitting: Orthopaedic Surgery

## 2021-01-22 DIAGNOSIS — Z8616 Personal history of COVID-19: Secondary | ICD-10-CM | POA: Insufficient documentation

## 2021-01-22 DIAGNOSIS — I2510 Atherosclerotic heart disease of native coronary artery without angina pectoris: Secondary | ICD-10-CM | POA: Insufficient documentation

## 2021-01-22 DIAGNOSIS — Z01818 Encounter for other preprocedural examination: Secondary | ICD-10-CM

## 2021-01-22 DIAGNOSIS — I10 Essential (primary) hypertension: Secondary | ICD-10-CM | POA: Insufficient documentation

## 2021-01-22 DIAGNOSIS — M1612 Unilateral primary osteoarthritis, left hip: Principal | ICD-10-CM | POA: Insufficient documentation

## 2021-01-22 DIAGNOSIS — Z79899 Other long term (current) drug therapy: Secondary | ICD-10-CM | POA: Insufficient documentation

## 2021-01-22 DIAGNOSIS — Z87891 Personal history of nicotine dependence: Secondary | ICD-10-CM | POA: Insufficient documentation

## 2021-01-22 DIAGNOSIS — R7303 Prediabetes: Secondary | ICD-10-CM | POA: Insufficient documentation

## 2021-01-22 DIAGNOSIS — J449 Chronic obstructive pulmonary disease, unspecified: Secondary | ICD-10-CM | POA: Insufficient documentation

## 2021-01-22 DIAGNOSIS — Z7982 Long term (current) use of aspirin: Secondary | ICD-10-CM | POA: Insufficient documentation

## 2021-01-22 DIAGNOSIS — Z419 Encounter for procedure for purposes other than remedying health state, unspecified: Secondary | ICD-10-CM

## 2021-01-22 DIAGNOSIS — E039 Hypothyroidism, unspecified: Secondary | ICD-10-CM | POA: Insufficient documentation

## 2021-01-22 HISTORY — PX: TOTAL HIP ARTHROPLASTY: SHX124

## 2021-01-22 LAB — TYPE AND SCREEN
ABO/RH(D): AB POS
Antibody Screen: NEGATIVE

## 2021-01-22 LAB — GLUCOSE, CAPILLARY: Glucose-Capillary: 73 mg/dL (ref 70–99)

## 2021-01-22 SURGERY — ARTHROPLASTY, HIP, TOTAL, ANTERIOR APPROACH
Anesthesia: Spinal | Site: Hip | Laterality: Left

## 2021-01-22 MED ORDER — HYDROCODONE-ACETAMINOPHEN 7.5-325 MG PO TABS
1.0000 | ORAL_TABLET | ORAL | Status: DC | PRN
  Administered 2021-01-22 – 2021-01-23 (×3): 2 via ORAL
  Filled 2021-01-22 (×3): qty 2

## 2021-01-22 MED ORDER — LACTATED RINGERS IV SOLN: INTRAVENOUS | Status: DC

## 2021-01-22 MED ORDER — DOCUSATE SODIUM 100 MG PO CAPS
100.0000 mg | ORAL_CAPSULE | Freq: Two times a day (BID) | ORAL | Status: DC
  Administered 2021-01-22 – 2021-01-23 (×2): 100 mg via ORAL
  Filled 2021-01-22 (×2): qty 1

## 2021-01-22 MED ORDER — TRANEXAMIC ACID 1000 MG/10ML IV SOLN
2000.0000 mg | INTRAVENOUS | Status: DC
  Filled 2021-01-22: qty 20

## 2021-01-22 MED ORDER — ASPIRIN 81 MG PO CHEW: 81.0000 mg | CHEWABLE_TABLET | Freq: Two times a day (BID) | ORAL | Status: DC

## 2021-01-22 MED ORDER — HYDROCODONE-ACETAMINOPHEN 5-325 MG PO TABS: 1.0000 | ORAL_TABLET | ORAL | Status: DC | PRN

## 2021-01-22 MED ORDER — OXYCODONE HCL 5 MG/5ML PO SOLN: 5.0000 mg | Freq: Once | ORAL | Status: DC | PRN

## 2021-01-22 MED ORDER — BUPIVACAINE IN DEXTROSE 0.75-8.25 % IT SOLN
INTRATHECAL | Status: DC | PRN
  Administered 2021-01-22: 1.6 mL via INTRATHECAL

## 2021-01-22 MED ORDER — LEVOTHYROXINE SODIUM 125 MCG PO TABS
125.0000 ug | ORAL_TABLET | Freq: Every day | ORAL | Status: DC
  Administered 2021-01-23: 125 ug via ORAL
  Filled 2021-01-22: qty 1

## 2021-01-22 MED ORDER — BUPIVACAINE LIPOSOME 1.3 % IJ SUSP
INTRAMUSCULAR | Status: DC | PRN
  Administered 2021-01-22: 10 mL

## 2021-01-22 MED ORDER — BUPIVACAINE LIPOSOME 1.3 % IJ SUSP
INTRAMUSCULAR | Status: AC
  Filled 2021-01-22: qty 10

## 2021-01-22 MED ORDER — CARVEDILOL 6.25 MG PO TABS
6.2500 mg | ORAL_TABLET | Freq: Two times a day (BID) | ORAL | Status: DC
  Administered 2021-01-22 – 2021-01-23 (×2): 6.25 mg via ORAL
  Filled 2021-01-22 (×2): qty 1

## 2021-01-22 MED ORDER — ALLOPURINOL 300 MG PO TABS
300.0000 mg | ORAL_TABLET | Freq: Every day | ORAL | Status: DC
  Administered 2021-01-22 – 2021-01-23 (×2): 300 mg via ORAL
  Filled 2021-01-22 (×2): qty 1

## 2021-01-22 MED ORDER — MIDAZOLAM HCL 2 MG/2ML IJ SOLN
INTRAMUSCULAR | Status: AC
  Filled 2021-01-22: qty 2

## 2021-01-22 MED ORDER — NITROGLYCERIN 0.4 MG SL SUBL: 0.4000 mg | SUBLINGUAL_TABLET | SUBLINGUAL | Status: DC | PRN

## 2021-01-22 MED ORDER — ORAL CARE MOUTH RINSE
15.0000 mL | Freq: Once | OROMUCOSAL | Status: AC
  Administered 2021-01-22: 15 mL via OROMUCOSAL

## 2021-01-22 MED ORDER — METHOCARBAMOL 500 MG IVPB - SIMPLE MED
500.0000 mg | Freq: Four times a day (QID) | INTRAVENOUS | Status: DC | PRN
  Administered 2021-01-22: 500 mg via INTRAVENOUS
  Filled 2021-01-22: qty 50

## 2021-01-22 MED ORDER — CEFAZOLIN SODIUM-DEXTROSE 2-4 GM/100ML-% IV SOLN
INTRAVENOUS | Status: AC
  Filled 2021-01-22: qty 100

## 2021-01-22 MED ORDER — SELENIUM 200 MCG PO CAPS: 200.0000 ug | ORAL_CAPSULE | Freq: Every day | ORAL | Status: DC

## 2021-01-22 MED ORDER — PROPOFOL 500 MG/50ML IV EMUL
INTRAVENOUS | Status: DC | PRN
  Administered 2021-01-22: 100 ug/kg/min via INTRAVENOUS

## 2021-01-22 MED ORDER — ONDANSETRON HCL 4 MG/2ML IJ SOLN: 4.0000 mg | Freq: Once | INTRAMUSCULAR | Status: DC | PRN

## 2021-01-22 MED ORDER — STERILE WATER FOR IRRIGATION IR SOLN
Status: DC | PRN
  Administered 2021-01-22: 2000 mL

## 2021-01-22 MED ORDER — ONDANSETRON HCL 4 MG/2ML IJ SOLN
INTRAMUSCULAR | Status: AC
  Filled 2021-01-22: qty 2

## 2021-01-22 MED ORDER — LINACLOTIDE 145 MCG PO CAPS
145.0000 ug | ORAL_CAPSULE | Freq: Every day | ORAL | Status: DC
  Administered 2021-01-23: 145 ug via ORAL
  Filled 2021-01-22: qty 1

## 2021-01-22 MED ORDER — ACETAMINOPHEN 325 MG PO TABS
325.0000 mg | ORAL_TABLET | Freq: Four times a day (QID) | ORAL | Status: DC | PRN
  Administered 2021-01-23: 650 mg via ORAL
  Filled 2021-01-22: qty 2

## 2021-01-22 MED ORDER — BISACODYL 5 MG PO TBEC: 5.0000 mg | DELAYED_RELEASE_TABLET | Freq: Every day | ORAL | Status: DC | PRN

## 2021-01-22 MED ORDER — MIDAZOLAM HCL 5 MG/5ML IJ SOLN
INTRAMUSCULAR | Status: DC | PRN
Start: 2021-01-22 — End: 2021-01-22
  Administered 2021-01-22: 2 mg via INTRAVENOUS

## 2021-01-22 MED ORDER — BUPIVACAINE-EPINEPHRINE (PF) 0.25% -1:200000 IJ SOLN
INTRAMUSCULAR | Status: DC | PRN
  Administered 2021-01-22: 30 mL

## 2021-01-22 MED ORDER — FENTANYL CITRATE PF 50 MCG/ML IJ SOSY
25.0000 ug | PREFILLED_SYRINGE | INTRAMUSCULAR | Status: DC | PRN
  Administered 2021-01-22: 50 ug via INTRAVENOUS

## 2021-01-22 MED ORDER — ALUM & MAG HYDROXIDE-SIMETH 200-200-20 MG/5ML PO SUSP: 30.0000 mL | ORAL | Status: DC | PRN

## 2021-01-22 MED ORDER — DIPHENHYDRAMINE HCL 12.5 MG/5ML PO ELIX: 12.5000 mg | ORAL_SOLUTION | ORAL | Status: DC | PRN

## 2021-01-22 MED ORDER — TRANEXAMIC ACID-NACL 1000-0.7 MG/100ML-% IV SOLN
1000.0000 mg | Freq: Once | INTRAVENOUS | Status: AC
  Administered 2021-01-22: 1000 mg via INTRAVENOUS
  Filled 2021-01-22: qty 100

## 2021-01-22 MED ORDER — ONDANSETRON HCL 4 MG/2ML IJ SOLN: 4.0000 mg | Freq: Four times a day (QID) | INTRAMUSCULAR | Status: DC | PRN

## 2021-01-22 MED ORDER — OXYCODONE HCL 5 MG PO TABS: 5.0000 mg | ORAL_TABLET | Freq: Once | ORAL | Status: DC | PRN

## 2021-01-22 MED ORDER — POVIDONE-IODINE 10 % EX SWAB
2.0000 "application " | Freq: Once | CUTANEOUS | Status: AC
  Administered 2021-01-22: 2 via TOPICAL

## 2021-01-22 MED ORDER — PHENYLEPHRINE HCL (PRESSORS) 10 MG/ML IV SOLN
INTRAVENOUS | Status: AC
  Filled 2021-01-22: qty 2

## 2021-01-22 MED ORDER — FENTANYL CITRATE PF 50 MCG/ML IJ SOSY
PREFILLED_SYRINGE | INTRAMUSCULAR | Status: AC
  Filled 2021-01-22: qty 1

## 2021-01-22 MED ORDER — CEFAZOLIN SODIUM-DEXTROSE 2-4 GM/100ML-% IV SOLN
2.0000 g | Freq: Four times a day (QID) | INTRAVENOUS | Status: AC
  Administered 2021-01-22 – 2021-01-23 (×2): 2 g via INTRAVENOUS
  Filled 2021-01-22 (×2): qty 100

## 2021-01-22 MED ORDER — BUPIVACAINE LIPOSOME 1.3 % IJ SUSP: 10.0000 mL | Freq: Once | INTRAMUSCULAR | Status: DC

## 2021-01-22 MED ORDER — FUROSEMIDE 40 MG PO TABS
40.0000 mg | ORAL_TABLET | Freq: Every day | ORAL | Status: DC
  Administered 2021-01-22 – 2021-01-23 (×2): 40 mg via ORAL
  Filled 2021-01-22 (×2): qty 1

## 2021-01-22 MED ORDER — ROSUVASTATIN CALCIUM 10 MG PO TABS
10.0000 mg | ORAL_TABLET | ORAL | Status: DC
  Filled 2021-01-22: qty 1

## 2021-01-22 MED ORDER — METOCLOPRAMIDE HCL 5 MG PO TABS: 5.0000 mg | ORAL_TABLET | Freq: Three times a day (TID) | ORAL | Status: DC | PRN

## 2021-01-22 MED ORDER — ACETAMINOPHEN 500 MG PO TABS
500.0000 mg | ORAL_TABLET | Freq: Four times a day (QID) | ORAL | Status: AC
  Administered 2021-01-22 – 2021-01-23 (×4): 500 mg via ORAL
  Filled 2021-01-22 (×4): qty 1

## 2021-01-22 MED ORDER — BUPIVACAINE-EPINEPHRINE (PF) 0.25% -1:200000 IJ SOLN
INTRAMUSCULAR | Status: AC
  Filled 2021-01-22: qty 30

## 2021-01-22 MED ORDER — PROPOFOL 10 MG/ML IV BOLUS
INTRAVENOUS | Status: DC | PRN
  Administered 2021-01-22: 30 mg via INTRAVENOUS
  Administered 2021-01-22: 20 mg via INTRAVENOUS

## 2021-01-22 MED ORDER — METHOCARBAMOL 500 MG IVPB - SIMPLE MED
INTRAVENOUS | Status: AC
  Filled 2021-01-22: qty 50

## 2021-01-22 MED ORDER — TRANEXAMIC ACID-NACL 1000-0.7 MG/100ML-% IV SOLN
INTRAVENOUS | Status: AC
  Filled 2021-01-22: qty 100

## 2021-01-22 MED ORDER — PHENYLEPHRINE HCL-NACL 20-0.9 MG/250ML-% IV SOLN
INTRAVENOUS | Status: DC | PRN
  Administered 2021-01-22: 50 ug/min via INTRAVENOUS

## 2021-01-22 MED ORDER — POTASSIUM CHLORIDE ER 10 MEQ PO TBCR
10.0000 meq | EXTENDED_RELEASE_TABLET | Freq: Every day | ORAL | Status: DC
  Administered 2021-01-22 – 2021-01-23 (×2): 10 meq via ORAL
  Filled 2021-01-22 (×3): qty 1

## 2021-01-22 MED ORDER — METOCLOPRAMIDE HCL 5 MG/ML IJ SOLN: 5.0000 mg | Freq: Three times a day (TID) | INTRAMUSCULAR | Status: DC | PRN

## 2021-01-22 MED ORDER — LOSARTAN POTASSIUM 50 MG PO TABS
50.0000 mg | ORAL_TABLET | Freq: Every day | ORAL | Status: DC
  Administered 2021-01-22 – 2021-01-23 (×2): 50 mg via ORAL
  Filled 2021-01-22 (×2): qty 1

## 2021-01-22 MED ORDER — TRANEXAMIC ACID 1000 MG/10ML IV SOLN
INTRAVENOUS | Status: DC | PRN
  Administered 2021-01-22: 2000 mg via TOPICAL

## 2021-01-22 MED ORDER — ONDANSETRON HCL 4 MG PO TABS: 4.0000 mg | ORAL_TABLET | Freq: Four times a day (QID) | ORAL | Status: DC | PRN

## 2021-01-22 MED ORDER — KETOROLAC TROMETHAMINE 15 MG/ML IJ SOLN
7.5000 mg | Freq: Four times a day (QID) | INTRAMUSCULAR | Status: AC
  Administered 2021-01-22 – 2021-01-23 (×4): 7.5 mg via INTRAVENOUS
  Filled 2021-01-22 (×4): qty 1

## 2021-01-22 MED ORDER — MORPHINE SULFATE (PF) 2 MG/ML IV SOLN: 0.5000 mg | INTRAVENOUS | Status: DC | PRN

## 2021-01-22 MED ORDER — PHENOL 1.4 % MT LIQD: 1.0000 | OROMUCOSAL | Status: DC | PRN

## 2021-01-22 MED ORDER — METHOCARBAMOL 500 MG PO TABS
500.0000 mg | ORAL_TABLET | Freq: Four times a day (QID) | ORAL | Status: DC | PRN
  Administered 2021-01-22 – 2021-01-23 (×2): 500 mg via ORAL
  Filled 2021-01-22 (×2): qty 1

## 2021-01-22 MED ORDER — 0.9 % SODIUM CHLORIDE (POUR BTL) OPTIME
TOPICAL | Status: DC | PRN
  Administered 2021-01-22: 1000 mL

## 2021-01-22 MED ORDER — CHLORHEXIDINE GLUCONATE 0.12 % MT SOLN: 15.0000 mL | Freq: Once | OROMUCOSAL | Status: AC

## 2021-01-22 MED ORDER — ONDANSETRON HCL 4 MG/2ML IJ SOLN
INTRAMUSCULAR | Status: DC | PRN
  Administered 2021-01-22: 4 mg via INTRAVENOUS

## 2021-01-22 MED ORDER — TRANEXAMIC ACID-NACL 1000-0.7 MG/100ML-% IV SOLN
1000.0000 mg | INTRAVENOUS | Status: AC
  Administered 2021-01-22: 1000 mg via INTRAVENOUS

## 2021-01-22 MED ORDER — AMISULPRIDE (ANTIEMETIC) 5 MG/2ML IV SOLN: 10.0000 mg | Freq: Once | INTRAVENOUS | Status: DC | PRN

## 2021-01-22 MED ORDER — MENTHOL 3 MG MT LOZG: 1.0000 | LOZENGE | OROMUCOSAL | Status: DC | PRN

## 2021-01-22 MED ORDER — CEFAZOLIN SODIUM-DEXTROSE 2-4 GM/100ML-% IV SOLN
2.0000 g | INTRAVENOUS | Status: AC
  Administered 2021-01-22: 2 g via INTRAVENOUS

## 2021-01-22 SURGICAL SUPPLY — 47 items
BAG COUNTER SPONGE SURGICOUNT (BAG) ×2 IMPLANT
BAG DECANTER FOR FLEXI CONT (MISCELLANEOUS) ×3 IMPLANT
BAG SURGICOUNT SPONGE COUNTING (BAG) ×1
BLADE SAW SGTL 18X1.27X75 (BLADE) ×2 IMPLANT
BLADE SAW SGTL 18X1.27X75MM (BLADE) ×1
BOOTIES KNEE HIGH SLOAN (MISCELLANEOUS) ×3 IMPLANT
CELLS DAT CNTRL 66122 CELL SVR (MISCELLANEOUS) ×1 IMPLANT
COVER PERINEAL POST (MISCELLANEOUS) ×3 IMPLANT
COVER SURGICAL LIGHT HANDLE (MISCELLANEOUS) ×3 IMPLANT
CUP GRIPTON 48MM 100 HIP (Hips) ×3 IMPLANT
DECANTER SPIKE VIAL GLASS SM (MISCELLANEOUS) ×3 IMPLANT
DRAPE FOOT SWITCH (DRAPES) ×3 IMPLANT
DRAPE IMP U-DRAPE 54X76 (DRAPES) ×3 IMPLANT
DRAPE ORTHO SPLIT 77X108 STRL (DRAPES)
DRAPE STERI IOBAN 125X83 (DRAPES) ×3 IMPLANT
DRAPE SURG ORHT 6 SPLT 77X108 (DRAPES) IMPLANT
DRAPE U-SHAPE 47X51 STRL (DRAPES) ×6 IMPLANT
DRSG AQUACEL AG ADV 3.5X 6 (GAUZE/BANDAGES/DRESSINGS) ×3 IMPLANT
DURAPREP 26ML APPLICATOR (WOUND CARE) ×3 IMPLANT
ELECT BLADE TIP CTD 4 INCH (ELECTRODE) ×3 IMPLANT
ELECT REM PT RETURN 15FT ADLT (MISCELLANEOUS) ×3 IMPLANT
ELIMINATOR HOLE APEX DEPUY (Hips) ×3 IMPLANT
GLOVE SRG 8 PF TXTR STRL LF DI (GLOVE) ×2 IMPLANT
GLOVE SURG ENC MOIS LTX SZ8 (GLOVE) ×6 IMPLANT
GLOVE SURG UNDER POLY LF SZ8 (GLOVE) ×6
GOWN STRL REUS W/TWL XL LVL3 (GOWN DISPOSABLE) ×6 IMPLANT
HEAD FEMORAL 32 CERAMIC (Hips) ×3 IMPLANT
HOLDER FOLEY CATH W/STRAP (MISCELLANEOUS) ×3 IMPLANT
KIT TURNOVER KIT A (KITS) ×3 IMPLANT
LINER ACET 32X48 (Liner) ×3 IMPLANT
MANIFOLD NEPTUNE II (INSTRUMENTS) ×3 IMPLANT
NEEDLE HYPO 22GX1.5 SAFETY (NEEDLE) ×3 IMPLANT
NS IRRIG 1000ML POUR BTL (IV SOLUTION) IMPLANT
PACK ANTERIOR HIP CUSTOM (KITS) ×3 IMPLANT
PENCIL SMOKE EVACUATOR (MISCELLANEOUS) IMPLANT
PROTECTOR NERVE ULNAR (MISCELLANEOUS) IMPLANT
RTRCTR WOUND ALEXIS 18CM MED (MISCELLANEOUS) ×3
SPONGE T-LAP 18X18 ~~LOC~~+RFID (SPONGE) ×6 IMPLANT
STEM FEMORAL SZ6 HIGH ACTIS (Stem) ×3 IMPLANT
SUT ETHIBOND NAB CT1 #1 30IN (SUTURE) ×6 IMPLANT
SUT VIC AB 1 CT1 36 (SUTURE) ×3 IMPLANT
SUT VIC AB 2-0 CT1 27 (SUTURE) ×3
SUT VIC AB 2-0 CT1 TAPERPNT 27 (SUTURE) ×1 IMPLANT
SUT VICRYL AB 3-0 FS1 BRD 27IN (SUTURE) ×3 IMPLANT
SUT VLOC 180 0 24IN GS25 (SUTURE) ×3 IMPLANT
SYR 50ML LL SCALE MARK (SYRINGE) ×3 IMPLANT
TRAY FOLEY MTR SLVR 14FR STAT (SET/KITS/TRAYS/PACK) ×3 IMPLANT

## 2021-01-22 NOTE — Interval H&P Note (Signed)
History and Physical Interval Note:  01/22/2021 11:15 AM  Caitlyn [name]  has presented today for surgery, with the diagnosis of LEFT HIP DEGENERATIVE JOINT DISEASE.  The various methods of treatment have been discussed with the patient and family. After consideration of risks, benefits and other options for treatment, the patient has consented to  Procedure(s): LEFT TOTAL HIP ARTHROPLASTY ANTERIOR APPROACH (Left) as a surgical intervention.  The patient's history has been reviewed, patient examined, no change in status, stable for surgery.  I have reviewed the patient's chart and labs.  Questions were answered to the patient's satisfaction.     Velna Ochs

## 2021-01-22 NOTE — Progress Notes (Signed)
PHARMACIST - PHYSICIAN ORDER COMMUNICATION  CONCERNING: P&T Medication Policy on Herbal Medications  DESCRIPTION:  This patient's order for:  selenium  has been noted.  This product(s) is classified as an "herbal" or natural product. Due to a lack of definitive safety studies or FDA approval, nonstandard manufacturing practices, plus the potential risk of unknown drug-drug interactions while on inpatient medications, the Pharmacy and Therapeutics Committee does not permit the use of "herbal" or natural products of this type within [facility].   ACTION TAKEN: The pharmacy department is unable to verify this order at this time and your patient has been informed of this safety policy. Please reevaluate patient's clinical condition at discharge and address if the herbal or natural product(s) should be resumed at that time.  

## 2021-01-22 NOTE — Op Note (Signed)
PRE-OP DIAGNOSIS:  LEFT HIP DEGENERATIVE JOINT DISEASE POST-OP DIAGNOSIS: same PROCEDURE:  LEFT TOTAL HIP ARTHROPLASTY ANTERIOR APPROACH ANESTHESIA:  Spinal and MAC SURGEON:  Melrose Nakayama MD ASSISTANT:  Loni Dolly PA-C   INDICATIONS FOR PROCEDURE:  The patient is a 70 y.o. female with a long history of a painful hip.  This has persisted despite multiple conservative measures.  The patient has persisted with pain and dysfunction making rest and activity difficult.  A total hip replacement is offered as surgical treatment.  Informed operative consent was obtained after discussion of possible complications including reaction to anesthesia, infection, neurovascular injury, dislocation, DVT, PE, and death.  The importance of the postoperative rehab program to optimize result was stressed with the patient.  SUMMARY OF FINDINGS AND PROCEDURE:  Under the above anesthesia through a anterior approach an the Hana table a left THR was performed.  The patient had severe degenerative change and fair bone quality.  We used DePuy components to replace the hip and these were size 6 high offset Actis femur capped with a +1 40m ceramic hip ball.  On the acetabular side we used a size 48 Gription shell with a plus 0 neutral polyethylene liner.  We did use a hole eliminator.  ALoni DollyPA-C assisted throughout and was invaluable to the completion of the case in that he helped position and retract while I performed the procedure.  He also closed simultaneously to help minimize OR time.  I used fluoroscopy throughout the case to check position of components and leg lengths and read all these views myself.  DESCRIPTION OF PROCEDURE:  The patient was taken to the OR suite where the above anesthetic was applied.  The patient was then positioned on the Hana table supine.  All bony prominences were appropriately padded.  Prep and drape was then performed in normal sterile fashion.  The patient was given kefzol preoperative  antibiotic and an appropriate time out was performed.  We then took an anterior approach to the left hip.  Dissection was taken through adipose to the tensor fascia lata fascia.  This structure was incised longitudinally and we dissected in the intermuscular interval just medial to this muscle.  Cobra retractors were placed superior and inferior to the femoral neck superficial to the capsule.  A capsular incision was then made and the retractors were placed along the femoral neck.  Xray was brought in to get a good level for the femoral neck cut which was made with an oscillating saw and osteotome.  The femoral head was removed with a corkscrew.  The acetabulum was exposed and some labral tissues were excised. Reaming was taken to the inside wall of the pelvis and sequentially up to 1 mm smaller than the actual component.  A trial of components was done and then the aforementioned acetabular shell was placed in appropriate tilt and anteversion confirmed by fluoroscopy. The liner was placed along with the hole eliminator and attention was turned to the femur.  The leg was brought down and over into adduction and the elevator bar was used to raise the femur up gently in the wound.  The piriformis was released with care taken to preserve the obturator internus attachment and all of the posterior capsule. The femur was reamed and then broached to the appropriate size.  A trial reduction was done and the aforementioned head and neck assembly gave uKoreathe best stability in extension with external rotation.  Leg lengths were felt to be about equal  by fluoroscopic exam.  The trial components were removed and the wound irrigated.  We then placed the femoral component in appropriate anteversion.  The head was applied to a dry stem neck and the hip again reduced.  It was again stable in the aforementioned position.  The would was irrigated again followed by re-approximation of anterior capsule with ethibond suture. Tensor  fascia was repaired with V-loc suture  followed by deep closure with #O and #2 undyed vicryl.  Skin was closed with subQ stitch and steristrips followed by a sterile dressing.  EBL and IOF can be obtained from anesthesia records.  DISPOSITION:  The patient was extubated in the OR and taken to PACU in stable condition to be admitted to the Orthopedic Surgery for appropriate post-op care to include perioperative antibiotics and DVT prophylaxis.

## 2021-01-22 NOTE — Anesthesia Postprocedure Evaluation (Signed)
Anesthesia Post Note  Patient: IVANA NICASTRO  Procedure(s) Performed: LEFT TOTAL HIP ARTHROPLASTY ANTERIOR APPROACH (Left: Hip)     Patient location during evaluation: PACU Anesthesia Type: Spinal Level of consciousness: oriented and awake and alert Pain management: pain level controlled Vital Signs Assessment: post-procedure vital signs reviewed and stable Respiratory status: spontaneous breathing, respiratory function stable and nonlabored ventilation Cardiovascular status: blood pressure returned to baseline and stable Postop Assessment: no headache, no backache, no apparent nausea or vomiting and spinal receding Anesthetic complications: no   No notable events documented.  Last Vitals:  Vitals:   01/22/21 1530 01/22/21 1634  BP: (!) 152/71 (!) 170/72  Pulse: 64 63  Resp: 13 18  Temp: 36.4 C 37 C  SpO2: 100% 100%    Last Pain:  Vitals:   01/22/21 1634  TempSrc: Oral  PainSc: 10-Worst pain ever                 Lucretia Kern

## 2021-01-22 NOTE — Anesthesia Procedure Notes (Signed)
Spinal  Patient location during procedure: OR End time: 01/22/2021 12:11 PM Reason for block: surgical anesthesia Staffing Performed: resident/CRNA and anesthesiologist  Anesthesiologist: Lucretia Kern, MD Resident/CRNA: Enriqueta Shutter D, CRNA Preanesthetic Checklist Completed: patient identified, IV checked, site marked, risks and benefits discussed, surgical consent, monitors and equipment checked, pre-op evaluation and timeout performed Spinal Block Patient position: sitting Prep: ChloraPrep Patient monitoring: heart rate, continuous pulse ox and blood pressure Approach: midline Location: L3-4 Injection technique: single-shot Needle Needle type: Spinocan  Needle gauge: 24 G Needle length: 9 cm Assessment Sensory level: T6 Events: CSF return and second provider

## 2021-01-22 NOTE — Transfer of Care (Signed)
Immediate Anesthesia Transfer of Care Note  Patient: Caitlyn [name]  Procedure(s) Performed: LEFT TOTAL HIP ARTHROPLASTY ANTERIOR APPROACH (Left: Hip)  Patient Location: PACU  Anesthesia Type:Spinal  Level of Consciousness: awake, alert  and oriented  Airway & Oxygen Therapy: Patient Spontanous Breathing  Post-op Assessment: Report given to RN and Post -op Vital signs reviewed and stable  Post vital signs: Reviewed and stable  Last Vitals:  Vitals Value Taken Time  BP 99/54 01/22/21 1350  Temp    Pulse 46 01/22/21 1353  Resp 13 01/22/21 1353  SpO2 100 % 01/22/21 1353  Vitals shown include unvalidated device data.  Last Pain:  Vitals:   01/22/21 1020  TempSrc: Oral         Complications: No notable events documented.

## 2021-01-22 NOTE — Evaluation (Signed)
Physical Therapy Evaluation Patient Details Name: Caitlyn [name] MRN: [id number] DOB: Nov 30, 1950 Today's Date: 01/22/2021  History of Present Illness  Pt is 70 yo female s/p L anterior THA on 01/22/21.  Pt with hx including but not limited to COPD, HTN, sleep apnea, Graves disease, gout, HLD, CAD with stent, PVD  Clinical Impression  Pt is s/p THA resulting in the deficits listed below (see PT Problem List). At baseline, pt lives alone and is independent.  She reports she will have intermittent family support at d/c.  Today, pt was very painful in bed -described as stiff.  RN gave meds and pain relieved with L LE AAROM, so progressed with evaluation.  Pain decreased with sitting and further once in chair.  Pt required min-mod A for transfers and ambulated a few feet in room.  She is expected to progress well with therapy.  Pt will benefit from skilled PT to increase their independence and safety with mobility to allow discharge to the venue listed below.         Recommendations for follow up therapy are one component of a multi-disciplinary discharge planning process, led by the attending physician.  Recommendations may be updated based on patient status, additional functional criteria and insurance authorization.  Follow Up Recommendations Follow physician's recommendations for discharge plan and follow up therapies    Assistance Recommended at Discharge Frequent or constant Supervision/Assistance  Functional Status Assessment Patient has had a recent decline in their functional status and demonstrates the ability to make significant improvements in function in a reasonable and predictable amount of time.  Equipment Recommendations  Rolling walker (2 wheels)    Recommendations for Other Services       Precautions / Restrictions Precautions Precautions: Fall Restrictions Weight Bearing Restrictions: Yes LLE Weight Bearing: Weight bearing as tolerated      Mobility  Bed  Mobility Overal bed mobility: Needs Assistance Bed Mobility: Supine to Sit     Supine to sit: Mod assist;HOB elevated     General bed mobility comments: increased time, cues for sequencing, assist for L LE and to lift trunk    Transfers Overall transfer level: Needs assistance Equipment used: Rolling walker (2 wheels) Transfers: Sit to/from Stand Sit to Stand: Min assist           General transfer comment: Cues for hand placement and L LE management; min A to rise with increased time and cues for upright posture    Ambulation/Gait Ambulation/Gait assistance: Min assist Gait Distance (Feet): 4 Feet Assistive device: Rolling walker (2 wheels) Gait Pattern/deviations: Step-to pattern;Decreased stride length;Decreased weight shift to left Gait velocity: decreased     General Gait Details: Small steps in room; cues for sequencing and RW; fatigued easily  Stairs            Wheelchair Mobility    Modified Rankin (Stroke Patients Only)       Balance Overall balance assessment: Needs assistance Sitting-balance support: No upper extremity supported Sitting balance-Leahy Scale: Normal     Standing balance support: Bilateral upper extremity supported;Reliant on assistive device for balance Standing balance-Leahy Scale: Poor Standing balance comment: requiring RW                             Pertinent Vitals/Pain Pain Assessment: 0-10 Pain Score: 8  (decreased when OOB) Pain Location: L hip Pain Descriptors / Indicators:  (stiff) Pain Intervention(s): Limited activity within patient's tolerance;Monitored during session;Repositioned;Relaxation;RN gave pain  meds during session    Home Living Family/patient expects to be discharged to:: Private residence Living Arrangements: Alone Available Help at Discharge: Family;Available PRN/intermittently (help may be limited) Type of Home: House Home Access: Stairs to enter Entrance Stairs-Rails:  None Entrance Stairs-Number of Steps: 1   Home Layout: One level Home Equipment: None      Prior Function Prior Level of Function : Independent/Modified Independent;Driving             Mobility Comments: Ambulated in community without AD ADLs Comments: Independent with ADLs and IADLs     Hand Dominance        Extremity/Trunk Assessment   Upper Extremity Assessment Upper Extremity Assessment: Overall WFL for tasks assessed    Lower Extremity Assessment Lower Extremity Assessment: LLE deficits/detail;RLE deficits/detail RLE Deficits / Details: ROM WFL; MMT 5/5 LLE Deficits / Details: Expected post op changes; ROM WFL; MMT: ankle 5/5, knee 3/5, hip 1/5    Cervical / Trunk Assessment Cervical / Trunk Assessment: Normal  Communication   Communication: No difficulties  Cognition Arousal/Alertness: Awake/alert Behavior During Therapy: WFL for tasks assessed/performed Overall Cognitive Status: Within Functional Limits for tasks assessed                                          General Comments      Exercises     Assessment/Plan    PT Assessment Patient needs continued PT services  PT Problem List Decreased strength;Decreased mobility;Decreased range of motion;Decreased activity tolerance;Decreased balance;Decreased knowledge of use of DME;Pain       PT Treatment Interventions DME instruction;Therapeutic activities;Modalities;Gait training;Therapeutic exercise;Patient/family education;Stair training;Balance training;Functional mobility training    PT Goals (Current goals can be found in the Care Plan section)  Acute Rehab PT Goals Patient Stated Goal: return home PT Goal Formulation: With patient/family Time For Goal Achievement: 02/05/21 Potential to Achieve Goals: Good    Frequency 7X/week   Barriers to discharge Decreased caregiver support      Co-evaluation               AM-PAC PT "6 Clicks" Mobility  Outcome Measure Help  needed turning from your back to your side while in a flat bed without using bedrails?: A Little Help needed moving from lying on your back to sitting on the side of a flat bed without using bedrails?: A Lot Help needed moving to and from a bed to a chair (including a wheelchair)?: A Little Help needed standing up from a chair using your arms (e.g., wheelchair or bedside chair)?: A Little Help needed to walk in hospital room?: A Lot Help needed climbing 3-5 steps with a railing? : A Lot 6 Click Score: 15    End of Session Equipment Utilized During Treatment: Gait belt Activity Tolerance: Patient tolerated treatment well Patient left: with chair alarm set;in chair;with family/visitor present;with call bell/phone within reach Nurse Communication: Mobility status PT Visit Diagnosis: Other abnormalities of gait and mobility (R26.89);Muscle weakness (generalized) (M62.81)    Time: 3009-2330 PT Time Calculation (min) (ACUTE ONLY): 34 min   Charges:   PT Evaluation $PT Eval Low Complexity: 1 Low PT Treatments $Therapeutic Activity: 8-22 mins        Anise Salvo, PT Acute Rehab Services Pager [phone] [facility] [phone]   Rayetta Humphrey 01/22/2021, 5:43 PM

## 2021-01-23 ENCOUNTER — Encounter (HOSPITAL_COMMUNITY): Payer: Self-pay | Admitting: Orthopaedic Surgery

## 2021-01-23 DIAGNOSIS — M1612 Unilateral primary osteoarthritis, left hip: Secondary | ICD-10-CM | POA: Diagnosis not present

## 2021-01-23 MED ORDER — ASPIRIN EC 81 MG PO TBEC: 81.0000 mg | DELAYED_RELEASE_TABLET | Freq: Two times a day (BID) | ORAL | 0 refills | Status: DC

## 2021-01-23 MED ORDER — HYDROCODONE-ACETAMINOPHEN 5-325 MG PO TABS: 1.0000 | ORAL_TABLET | Freq: Four times a day (QID) | ORAL | 0 refills | Status: DC | PRN

## 2021-01-23 MED ORDER — TIZANIDINE HCL 4 MG PO TABS: 4.0000 mg | ORAL_TABLET | Freq: Four times a day (QID) | ORAL | 1 refills | Status: DC | PRN

## 2021-01-23 NOTE — Progress Notes (Signed)
Physical Therapy Treatment Patient Details Name: Caitlyn [name] MRN: [id number] DOB: 06-Mar-1951 Today's Date: 01/23/2021   History of Present Illness Pt is 70 yo female s/p L anterior THA on 01/22/21.  Pt with hx including but not limited to COPD, HTN, sleep apnea, Graves disease, gout, HLD, CAD with stent, PVD    PT Comments    Pt making gradual progress but with very slow gait speed (.14 ft/sec) , 50' ambulation, and shuffling/sliding L LE.  Did not have foot clearance or pain control to attempt stairs this morning.  She was concerned about BP at arrival but BP was monitored stable and pt asymptomatic during session.  Will f/u in afternoon for further therapy.    Recommendations for follow up therapy are one component of a multi-disciplinary discharge planning process, led by the attending physician.  Recommendations may be updated based on patient status, additional functional criteria and insurance authorization.  Follow Up Recommendations  Follow physician's recommendations for discharge plan and follow up therapies     Assistance Recommended at Discharge Frequent or constant Supervision/Assistance  Equipment Recommendations  Rolling walker (2 wheels)    Recommendations for Other Services       Precautions / Restrictions Precautions Precautions: Fall Restrictions Weight Bearing Restrictions: No LLE Weight Bearing: Weight bearing as tolerated     Mobility  Bed Mobility Overal bed mobility: Needs Assistance Bed Mobility: Supine to Sit     Supine to sit: Min assist     General bed mobility comments: increased time, cues for sequencing, assist for L LE ; use of bed rails    Transfers Overall transfer level: Needs assistance Equipment used: Rolling walker (2 wheels) Transfers: Sit to/from Stand Sit to Stand: Min guard           General transfer comment: Cues for hand placement and L LE management; increased time and cues for upright posture; performed x 3     Ambulation/Gait Ambulation/Gait assistance: Min guard Gait Distance (Feet): 50 Feet Assistive device: Rolling walker (2 wheels) Gait Pattern/deviations: Step-to pattern;Decreased stride length;Decreased weight shift to left Gait velocity: .14 ft/sec     General Gait Details: Cues for sequencing and RW use; did better with stepping with R LE first as she had difficulty advancing L LE; very slow gait pattern   Stairs             Wheelchair Mobility    Modified Rankin (Stroke Patients Only)       Balance Overall balance assessment: Needs assistance Sitting-balance support: No upper extremity supported Sitting balance-Leahy Scale: Normal     Standing balance support: Bilateral upper extremity supported;Reliant on assistive device for balance Standing balance-Leahy Scale: Poor Standing balance comment: requiring RW or leaning on sink for ADLs                            Cognition Arousal/Alertness: Awake/alert Behavior During Therapy: WFL for tasks assessed/performed Overall Cognitive Status: Within Functional Limits for tasks assessed                                          Exercises Total Joint Exercises Ankle Circles/Pumps: AROM;Both;10 reps;Supine Quad Sets: AROM;Both;10 reps;Supine Heel Slides: AAROM;Left;10 reps;Supine Hip ABduction/ADduction: AAROM;Left;10 reps;Supine Long Arc Quad: AROM;Left;10 reps;Seated Other Exercises Other Exercises: on all educated on correct for; for hip abd and heel slides  educated on gait belt for AAROM    General Comments General comments (skin integrity, edema, etc.): At arrival reports BP recently 109/48 and pt concerned about lower number.  Checked and BP now in supine 121/49 with MAP 69; sitting 133/56 with MAP 80; standing 124/63 with MAP 80.  Asymptomatic and MAP alway >60.      Pertinent Vitals/Pain Pain Assessment: 0-10 Pain Score: 5  Pain Location: L hip Pain Descriptors / Indicators:  Discomfort Pain Intervention(s): Limited activity within patient's tolerance;Monitored during session;Premedicated before session    Home Living                          Prior Function            PT Goals (current goals can now be found in the care plan section) Progress towards PT goals: Progressing toward goals    Frequency    7X/week      PT Plan Current plan remains appropriate    Co-evaluation              AM-PAC PT "6 Clicks" Mobility   Outcome Measure  Help needed turning from your back to your side while in a flat bed without using bedrails?: A Little Help needed moving from lying on your back to sitting on the side of a flat bed without using bedrails?: A Little Help needed moving to and from a bed to a chair (including a wheelchair)?: A Little Help needed standing up from a chair using your arms (e.g., wheelchair or bedside chair)?: A Little Help needed to walk in hospital room?: A Little Help needed climbing 3-5 steps with a railing? : A Lot 6 Click Score: 17    End of Session Equipment Utilized During Treatment: Gait belt Activity Tolerance: Patient tolerated treatment well Patient left: with chair alarm set;in chair;with call bell/phone within reach Nurse Communication: Mobility status PT Visit Diagnosis: Other abnormalities of gait and mobility (R26.89);Muscle weakness (generalized) (M62.81)     Time: 0881-1031 PT Time Calculation (min) (ACUTE ONLY): 44 min  Charges:  $Gait Training: 8-22 mins $Therapeutic Exercise: 8-22 mins $Therapeutic Activity: 8-22 mins                     Caitlyn [name], PT Acute Rehab Services Pager [phone] [name] [phone]    Caitlyn [name] 01/23/2021, 12:09 PM

## 2021-01-23 NOTE — Discharge Summary (Signed)
Patient ID: Caitlyn [name] MRN: [id number] DOB/AGE: 1951/01/11 70 y.o.  Admit date: 01/22/2021 Discharge date: 01/23/2021  Admission Diagnoses:  Active Problems:   Primary osteoarthritis of left hip   Discharge Diagnoses:  Same  Past Medical History:  Diagnosis Date   Anxiety    Arthritis    "legs" (01/07/2016)   Back pain    Back pain    CAD 06/2009   a.  s/p MI 4/11: tx with DES x 2 to RCA;  b.  LHC 03/17/11: LAD 30-40%, distal LAD 30-40%, OM2 40-50%, RCA stent patent, EF greater than 70%.;  c.  Lexiscan Myoview (12/15):  Mild apical thinning, no ischemia, EF 61%; normal study   CAROTID STENOSIS    carotid Dopplers 03/25/11: RICA 60-79%; LICA 0-39%.  Follow up recommended in 6 months.   Constipation    Constipation    Depression    Gout    "on RX for it" (01/07/2016)   Heart disease    History of heart attack    Hx of blood clots    HYPERLIPIDEMIA    HYPERTENSION    HYPOTHYROIDISM    post ablation tx Graves   Hypothyroidism    Joint pain    Myocardial infarction (HCC) 2012   "mini"   OSA on CPAP    Osteoarthritis    Pre-diabetes    PVD    Rheumatoid arthritis (HCC)    Sleep apnea    SOBOE (shortness of breath on exertion)    TOBACCO ABUSE quit 06/2009   Varicose veins    VITAMIN D DEFICIENCY    DEXA 04/2009 normal    Surgeries: Procedure(s): LEFT TOTAL HIP ARTHROPLASTY ANTERIOR APPROACH on 01/22/2021   Consultants:   Discharged Condition: Improved  Hospital Course: IVIE SAVITT is an 70 y.o. female who was admitted 01/22/2021 for operative treatment of<principal problem not specified>. Patient has severe unremitting pain that affects sleep, daily activities, and work/hobbies. After pre-op clearance the patient was taken to the operating room on 01/22/2021 and underwent  Procedure(s): LEFT TOTAL HIP ARTHROPLASTY ANTERIOR APPROACH.    Patient was given perioperative antibiotics:  Anti-infectives (From admission, onward)    Start     Dose/Rate Route  Frequency Ordered Stop   01/22/21 1800  ceFAZolin (ANCEF) IVPB 2g/100 mL premix        2 g 200 mL/hr over 30 Minutes Intravenous Every 6 hours 01/22/21 1628 01/23/21 0047   01/22/21 1015  ceFAZolin (ANCEF) IVPB 2g/100 mL premix        2 g 200 mL/hr over 30 Minutes Intravenous On call to O.R. 01/22/21 1002 01/22/21 1212   01/22/21 1014  ceFAZolin (ANCEF) 2-4 GM/100ML-% IVPB       Note to Pharmacy: Viviano Simas   : cabinet override      01/22/21 1014 01/22/21 1238        Patient was given sequential compression devices, early ambulation, and chemoprophylaxis to prevent DVT.  Patient benefited maximally from hospital stay and there were no complications.    Recent vital signs: Patient Vitals for the past 24 hrs:  BP Temp Temp src Pulse Resp SpO2 Height Weight  01/23/21 0511 132/81 98.7 F (37.1 C) Oral 64 18 100 % -- --  01/23/21 0157 126/63 97.8 F (36.6 C) Oral 62 18 99 % -- --  01/22/21 2052 (!) 141/60 98.7 F (37.1 C) Oral 72 15 99 % -- --  01/22/21 1831 (!) 149/73 97.7 F (36.5 C) Oral 67 17 100 % -- --  01/22/21 1739 -- -- -- -- -- -- 5\' 5"  (1.651 m) 109.9 kg  01/22/21 1634 (!) 170/72 98.6 F (37 C) Oral 63 18 100 % -- --  01/22/21 1530 (!) 152/71 97.6 F (36.4 C) -- 64 13 100 % -- --  01/22/21 1515 (!) 152/96 -- -- 61 15 100 % -- --  01/22/21 1500 (!) 153/85 -- -- (!) 54 14 100 % -- --  01/22/21 1445 (!) 118/58 -- -- (!) 53 18 100 % -- --  01/22/21 1430 (!) 106/54 -- -- (!) 44 12 100 % -- --  01/22/21 1415 117/73 -- -- (!) 50 (!) 21 100 % -- --  01/22/21 1400 (!) 101/49 -- -- (!) 52 14 100 % -- --  01/22/21 1350 (!) 99/54 (!) 97.5 F (36.4 C) -- (!) 48 12 100 % -- --  01/22/21 1020 (!) 212/86 98.8 F (37.1 C) Oral (!) 49 17 100 % -- --     Recent laboratory studies: No results for input(s): WBC, HGB, HCT, PLT, NA, K, CL, CO2, BUN, CREATININE, GLUCOSE, INR, CALCIUM in the last 72 hours.  Invalid input(s): PT, 2   Discharge Medications:   Allergies as of  01/23/2021       Reactions   Amlodipine Swelling   Lisinopril Cough   Statins Other (See Comments)   Myalgia        Medication List     TAKE these medications    allopurinol 300 MG tablet Commonly known as: ZYLOPRIM Take 300 mg by mouth daily.   aspirin EC 81 MG tablet Take 1 tablet (81 mg total) by mouth 2 (two) times daily after a meal. Swallow whole. What changed: when to take this   carvedilol 6.25 MG tablet Commonly known as: COREG Take 1 tablet (6.25 mg total) by mouth 2 (two) times daily with a meal.   colchicine 0.6 MG tablet Take 1.2 mg by mouth daily as needed (Gout flare ups).   furosemide 40 MG tablet Commonly known as: LASIX Take 40 mg by mouth daily.   HYDROcodone-acetaminophen 5-325 MG tablet Commonly known as: NORCO/VICODIN Take 1-2 tablets by mouth every 6 (six) hours as needed for moderate pain or severe pain (post op pain).   levothyroxine 125 MCG tablet Commonly known as: SYNTHROID Take 125 mcg by mouth daily before breakfast.   Linzess 145 MCG Caps capsule Generic drug: linaclotide Take 1 capsule (145 mcg total) by mouth daily.   losartan 50 MG tablet Commonly known as: COZAAR Take 1 tablet (50 mg total) by mouth daily.   nitroGLYCERIN 0.4 MG SL tablet Commonly known as: NITROSTAT Place 0.4 mg under the tongue every 5 (five) minutes as needed for chest pain.   potassium chloride 10 MEQ tablet Commonly known as: KLOR-CON Take 1 tablet (10 mEq total) by mouth daily as needed. Taking 1 tablet when taking Lasix What changed:  when to take this additional instructions   rosuvastatin 10 MG tablet Commonly known as: CRESTOR One tab po every other night b-4 bed What changed:  how much to take how to take this when to take this additional instructions   Selenium 200 MCG Caps Take 200 mcg by mouth daily.   Semaglutide (1 MG/DOSE) 4 MG/3ML Sopn Inject 1 mg into the skin every 7 (seven) days. 1 mg wkly What changed: additional  instructions   tiZANidine 4 MG tablet Commonly known as: Zanaflex Take 1 tablet (4 mg total) by mouth every 6 (six) hours as needed  for muscle spasms.   Vitamin D (Ergocalciferol) 1.25 MG (50000 UNIT) Caps capsule Commonly known as: DRISDOL 1 PO Q 5 DAYS What changed:  how much to take how to take this when to take this additional instructions   Vitamin D 50 MCG (2000 UT) tablet Take 1 tablet (2,000 Units total) by mouth daily.   zinc gluconate 50 MG tablet Take 50 mg by mouth daily.               Durable Medical Equipment  (From admission, onward)           Start     Ordered   01/22/21 1629  DME Walker rolling  Once       Question:  Patient needs a walker to treat with the following condition  Answer:  Primary osteoarthritis of left hip   01/22/21 1628   01/22/21 1629  DME 3 n 1  Once        01/22/21 1628   01/22/21 1629  DME Bedside commode  Once       Question:  Patient needs a bedside commode to treat with the following condition  Answer:  Primary osteoarthritis of left hip   01/22/21 1628            Diagnostic Studies: DG Chest 2 View  Result Date: 01/17/2021 CLINICAL DATA:  Preoperative evaluation for hip replacement, history COPD, coronary artery disease post MI and stenting, hypertension, diabetes mellitus EXAM: CHEST - 2 VIEW COMPARISON:  10/29/2020 FINDINGS: Enlargement of cardiac silhouette. Mediastinal contours and pulmonary vascularity normal. Atherosclerotic calcification aorta. Chronic bronchitic changes without pulmonary infiltrate, pleural effusion, or pneumothorax. Bones demineralized with biconvex thoracic scoliosis. IMPRESSION: Enlargement of cardiac silhouette. Chronic bronchitic changes. Electronically Signed   By: Ulyses Southward M.D.   On: 01/17/2021 08:43   DG C-Arm 1-60 Min-No Report  Result Date: 01/22/2021 Fluoroscopy was utilized by the requesting physician.  No radiographic interpretation.   VAS [place] ABI WITH/WO TBI  Result Date:  12/31/2020  LOWER EXTREMITY DOPPLER STUDY Patient Name:  DASHANA GUIZAR  Date of Exam:   12/31/2020 Medical Rec #: [id number]         Accession #:    [id number] Date of Birth: 1950/05/25        Patient Gender: F Patient Age:   40 years Exam Location:  [facility] Procedure:      VAS [place] ABI WITH/WO TBI Referring Phys: Graceann Congress --------------------------------------------------------------------------------  Indications: Peripheral artery disease. High Risk Factors: Hypertension, hyperlipidemia, coronary artery disease.  Comparison Study: 06/20/20 Performing Technologist: Jeb Levering RDMS, RVT  Examination Guidelines: A complete evaluation includes at minimum, Doppler waveform signals and systolic blood pressure reading at the level of bilateral brachial, anterior tibial, and posterior tibial arteries, when vessel segments are accessible. Bilateral testing is considered an integral part of a complete examination. Photoelectric Plethysmograph (PPG) waveforms and toe systolic pressure readings are included as required and additional duplex testing as needed. Limited examinations for reoccurring indications may be performed as noted.  ABI Findings: +---------+------------------+-----+----------+--------+ Right    Rt Pressure (mmHg)IndexWaveform  Comment  +---------+------------------+-----+----------+--------+ Brachial 182                                       +---------+------------------+-----+----------+--------+ PTA      111               0.61 monophasic         +---------+------------------+-----+----------+--------+  DP       113               0.62 monophasic         +---------+------------------+-----+----------+--------+ Great Toe65                0.36 Abnormal           +---------+------------------+-----+----------+--------+ +---------+------------------+-----+----------+-------+ Left     Lt Pressure (mmHg)IndexWaveform  Comment  +---------+------------------+-----+----------+-------+ PTA      157               0.86 biphasic          +---------+------------------+-----+----------+-------+ DP       152               0.84 monophasic        +---------+------------------+-----+----------+-------+ Great Toe96                0.53 Abnormal          +---------+------------------+-----+----------+-------+ +-------+-----------+-----------+------------+------------+ ABI/TBIToday's ABIToday's TBIPrevious ABIPrevious TBI +-------+-----------+-----------+------------+------------+ Right  0.62       0.36       0.69        0.64         +-------+-----------+-----------+------------+------------+ Left   0.86       0.53       0.98        0.6          +-------+-----------+-----------+------------+------------+   Summary: Right: Resting right ankle-brachial index indicates moderate right lower extremity arterial disease. The right toe-brachial index is abnormal. Left: Resting left ankle-brachial index indicates mild left lower extremity arterial disease. The left toe-brachial index is abnormal.  *See table(s) above for measurements and observations.  Electronically signed by Gerarda Fraction on 12/31/2020 at 7:03:48 PM.    Final    MYOCARDIAL PERFUSION IMAGING  Result Date: 01/15/2021   Findings are consistent with no prior ischemia and no prior myocardial infarction. The study is low risk.   No ST deviation was noted.   Left ventricular function is normal. Nuclear stress EF: 52 %. The left ventricular ejection fraction is mildly decreased (45-54%). End diastolic cavity size is normal.   Prior study available for comparison from 06/29/2017. No changes compared to prior study. Findings: Negative for stress induced arrhythmias. Small true apical perfusion defect in rest and stress without wall motion abnormality.  Not consistent with infarct. Conclusions: Stress test is negative for ischemia.  DG HIP UNILAT WITH PELVIS 1V  LEFT  Result Date: 01/22/2021 CLINICAL DATA:  Left hip arthroplasty EXAM: DG HIP (WITH OR WITHOUT PELVIS) 1V*L* COMPARISON:  None. FINDINGS: Fluoroscopic images show left hip arthroplasty. Fluoroscopic time is 20 seconds. Radiation dose is 3.3 mGy. Two images were obtained. IMPRESSION: Fluoroscopic assistance was provided for left hip arthroplasty. Electronically Signed   By: Ernie Avena M.D.   On: 01/22/2021 13:58   VAS US CAROTID  Result Date: 12/31/2020 Carotid Arterial Duplex Study Patient Name:  LAVADA LANGSAM  Date of Exam:   12/31/2020 Medical Rec #: [id number]         Accession #:    [id number] Date of Birth: 07-Jul-1950        Patient Gender: F Patient Age:   73 years Exam Location:  [facility] Procedure:      VAS US CAROTID Referring Phys: Leonette Most FIELDS --------------------------------------------------------------------------------  Indications:       Carotid artery disease. Risk Factors:      Hypertension, hyperlipidemia. Comparison Study:  11/29/13 R 60-79% (250/49 cm/s) L 40-59% (49/23 cm/s) Performing Technologist: Jeb Levering RDMS, RVT  Examination Guidelines: A complete evaluation includes B-mode imaging, spectral Doppler, color Doppler, and power Doppler as needed of all accessible portions of each vessel. Bilateral testing is considered an integral part of a complete examination. Limited examinations for reoccurring indications may be performed as noted.  Right Carotid Findings: +----------+--------+--------+--------+------------------+----------------+           PSV cm/sEDV cm/sStenosisPlaque DescriptionComments         +----------+--------+--------+--------+------------------+----------------+ CCA Prox  89      10                                tortuous loop    +----------+--------+--------+--------+------------------+----------------+ CCA Distal119     22              heterogenous                        +----------+--------+--------+--------+------------------+----------------+ ICA Prox  260     60      60-79%  heterogenous      low end of range +----------+--------+--------+--------+------------------+----------------+ ICA Mid   246     61                                                 +----------+--------+--------+--------+------------------+----------------+ ICA Distal113     31                                                 +----------+--------+--------+--------+------------------+----------------+ ECA       98      19                                                 +----------+--------+--------+--------+------------------+----------------+ +----------+--------+-------+----------------+-------------------+           PSV cm/sEDV cmsDescribe        Arm Pressure (mmHG) +----------+--------+-------+----------------+-------------------+ [id number]             Multiphasic, WNL                    +----------+--------+-------+----------------+-------------------+ +---------+--------+--+--------+-+--------+ VertebralPSV cm/s14EDV cm/s7Atypical +---------+--------+--+--------+-+--------+  Left Carotid Findings: +----------+--------+--------+--------+------------------+--------+           PSV cm/sEDV cm/sStenosisPlaque DescriptionComments +----------+--------+--------+--------+------------------+--------+ CCA Prox  81      14                                         +----------+--------+--------+--------+------------------+--------+ CCA Distal70      19                                         +----------+--------+--------+--------+------------------+--------+ ICA Prox  72      25      1-39%   heterogenous               +----------+--------+--------+--------+------------------+--------+  ICA Mid   85      28                                         +----------+--------+--------+--------+------------------+--------+ ICA Distal100     37                                          +----------+--------+--------+--------+------------------+--------+ ECA       67      16                                         +----------+--------+--------+--------+------------------+--------+ +----------+--------+--------+----------------+-------------------+           PSV cm/sEDV cm/sDescribe        Arm Pressure (mmHG) +----------+--------+--------+----------------+-------------------+ Subclavian172             Multiphasic, WNL                    +----------+--------+--------+----------------+-------------------+ +---------+--------+--+--------+-+--------+ VertebralPSV cm/s29EDV cm/s5Atypical +---------+--------+--+--------+-+--------+   Summary: Right Carotid: Velocities in the right ICA are consistent with a 60-79%                stenosis. Left Carotid: Velocities in the left ICA are consistent with a 1-39% stenosis.  *See table(s) above for measurements and observations.  Electronically signed by Gerarda Fraction on 12/31/2020 at 7:03:35 PM.    Final     Disposition: Discharge disposition: 01-Home or Self Care       Discharge Instructions     Call MD / Call 911   Complete by: As directed    If you experience chest pain or shortness of breath, CALL 911 and be transported to the hospital emergency room.  If you develope a fever above 101 F, pus (white drainage) or increased drainage or redness at the wound, or calf pain, call your surgeon's office.   Constipation Prevention   Complete by: As directed    Drink plenty of fluids.  Prune juice may be helpful.  You may use a stool softener, such as Colace (over the counter) 100 mg twice a day.  Use MiraLax (over the counter) for constipation as needed.   Diet - low sodium heart healthy   Complete by: As directed    Discharge instructions   Complete by: As directed    INSTRUCTIONS AFTER JOINT REPLACEMENT   Remove items at home which could result in a fall. This includes throw  rugs or furniture in walking pathways ICE to the affected joint every three hours while awake for 30 minutes at a time, for at least the first 3-5 days, and then as needed for pain and swelling.  Continue to use ice for pain and swelling. You may notice swelling that will progress down to the foot and ankle.  This is normal after surgery.  Elevate your leg when you are not up walking on it.   Continue to use the breathing machine you got in the hospital (incentive spirometer) which will help keep your temperature down.  It is common for your temperature to cycle up and down following surgery, especially at night when you are not up moving around and exerting yourself.  The breathing machine  keeps your lungs expanded and your temperature down.   DIET:  As you were doing prior to hospitalization, we recommend a well-balanced diet.  DRESSING / WOUND CARE / SHOWERING  You may shower 3 days after surgery, but keep the wounds dry during showering.  You may use an occlusive plastic wrap (Press'n Seal for example), NO SOAKING/SUBMERGING IN THE BATHTUB.  If the bandage gets wet, change with a clean dry gauze.  If the incision gets wet, pat the wound dry with a clean towel.  ACTIVITY  Increase activity slowly as tolerated, but follow the weight bearing instructions below.   No driving for 6 weeks or until further direction given by your physician.  You cannot drive while taking narcotics.  No lifting or carrying greater than 10 lbs. until further directed by your surgeon. Avoid periods of inactivity such as sitting longer than an hour when not asleep. This helps prevent blood clots.  You may return to work once you are authorized by your doctor.     WEIGHT BEARING   Weight bearing as tolerated with assist device (walker, cane, etc) as directed, use it as long as suggested by your surgeon or therapist, typically at least 4-6 weeks.   EXERCISES  Results after joint replacement surgery are often  greatly improved when you follow the exercise, range of motion and muscle strengthening exercises prescribed by your doctor. Safety measures are also important to protect the joint from further injury. Any time any of these exercises cause you to have increased pain or swelling, decrease what you are doing until you are comfortable again and then slowly increase them. If you have problems or questions, call your caregiver or physical therapist for advice.   Rehabilitation is important following a joint replacement. After just a few days of immobilization, the muscles of the leg can become weakened and shrink (atrophy).  These exercises are designed to build up the tone and strength of the thigh and leg muscles and to improve motion. Often times heat used for twenty to thirty minutes before working out will loosen up your tissues and help with improving the range of motion but do not use heat for the first two weeks following surgery (sometimes heat can increase post-operative swelling).   These exercises can be done on a training (exercise) mat, on the floor, on a table or on a bed. Use whatever works the best and is most comfortable for you.    Use music or television while you are exercising so that the exercises are a pleasant break in your day. This will make your life better with the exercises acting as a break in your routine that you can look forward to.   Perform all exercises about fifteen times, three times per day or as directed.  You should exercise both the operative leg and the other leg as well.  Exercises include:   Quad Sets - Tighten up the muscle on the front of the thigh (Quad) and hold for 5-10 seconds.   Straight Leg Raises - With your knee straight (if you were given a brace, keep it on), lift the leg to 60 degrees, hold for 3 seconds, and slowly lower the leg.  Perform this exercise against resistance later as your leg gets stronger.  Leg Slides: Lying on your back, slowly slide  your foot toward your buttocks, bending your knee up off the floor (only go as far as is comfortable). Then slowly slide your foot back down until your  leg is flat on the floor again.  Angel Wings: Lying on your back spread your legs to the side as far apart as you can without causing discomfort.  Hamstring Strength:  Lying on your back, push your heel against the floor with your leg straight by tightening up the muscles of your buttocks.  Repeat, but this time bend your knee to a comfortable angle, and push your heel against the floor.  You may put a pillow under the heel to make it more comfortable if necessary.   A rehabilitation program following joint replacement surgery can speed recovery and prevent re-injury in the future due to weakened muscles. Contact your doctor or a physical therapist for more information on knee rehabilitation.    CONSTIPATION  Constipation is defined medically as fewer than three stools per week and severe constipation as less than one stool per week.  Even if you have a regular bowel pattern at home, your normal regimen is likely to be disrupted due to multiple reasons following surgery.  Combination of anesthesia, postoperative narcotics, change in appetite and fluid intake all can affect your bowels.   YOU MUST use at least one of the following options; they are listed in order of increasing strength to get the job done.  They are all available over the counter, and you may need to use some, POSSIBLY even all of these options:    Drink plenty of fluids (prune juice may be helpful) and high fiber foods Colace 100 mg by mouth twice a day  Senokot for constipation as directed and as needed Dulcolax (bisacodyl), take with full glass of water  Miralax (polyethylene glycol) once or twice a day as needed.  If you have tried all these things and are unable to have a bowel movement in the first 3-4 days after surgery call either your surgeon or your primary doctor.    If  you experience loose stools or diarrhea, hold the medications until you stool forms back up.  If your symptoms do not get better within 1 week or if they get worse, check with your doctor.  If you experience "the worst abdominal pain ever" or develop nausea or vomiting, please contact the office immediately for further recommendations for treatment.   ITCHING:  If you experience itching with your medications, try taking only a single pain pill, or even half a pain pill at a time.  You can also use Benadryl over the counter for itching or also to help with sleep.   TED HOSE STOCKINGS:  Use stockings on both legs until for at least 2 weeks or as directed by physician office. They may be removed at night for sleeping.  MEDICATIONS:  See your medication summary on the "After Visit Summary" that nursing will review with you.  You may have some home medications which will be placed on hold until you complete the course of blood thinner medication.  It is important for you to complete the blood thinner medication as prescribed.  PRECAUTIONS:  If you experience chest pain or shortness of breath - call 911 immediately for transfer to the hospital emergency department.   If you develop a fever greater that 101 F, purulent drainage from wound, increased redness or drainage from wound, foul odor from the wound/dressing, or calf pain - CONTACT YOUR SURGEON.  FOLLOW-UP APPOINTMENTS:  If you do not already have a post-op appointment, please call the office for an appointment to be seen by your surgeon.  Guidelines for how soon to be seen are listed in your "After Visit Summary", but are typically between 1-4 weeks after surgery.  OTHER INSTRUCTIONS:   Knee Replacement:  Do not place pillow under knee, focus on keeping the knee straight while resting. CPM instructions: 0-90 degrees, 2 hours in the morning, 2 hours in the afternoon, and 2 hours in the evening. Place  foam block, curve side up under heel at all times except when in CPM or when walking.  DO NOT modify, tear, cut, or change the foam block in any way.  POST-OPERATIVE OPIOID TAPER INSTRUCTIONS: It is important to wean off of your opioid medication as soon as possible. If you do not need pain medication after your surgery it is ok to stop day one. Opioids include: Codeine, Hydrocodone(Norco, Vicodin), Oxycodone(Percocet, oxycontin) and hydromorphone amongst others.  Long term and even short term use of opiods can cause: Increased pain response Dependence Constipation Depression Respiratory depression And more.  Withdrawal symptoms can include Flu like symptoms Nausea, vomiting And more Techniques to manage these symptoms Hydrate well Eat regular healthy meals Stay active Use relaxation techniques(deep breathing, meditating, yoga) Do Not substitute Alcohol to help with tapering If you have been on opioids for less than two weeks and do not have pain than it is ok to stop all together.  Plan to wean off of opioids This plan should start within one week post op of your joint replacement. Maintain the same interval or time between taking each dose and first decrease the dose.  Cut the total daily intake of opioids by one tablet each day Next start to increase the time between doses. The last dose that should be eliminated is the evening dose.     MAKE SURE YOU:  Understand these instructions.  Get help right away if you are not doing well or get worse.    Thank you for letting us be a part of your medical care team.  It is a privilege we respect greatly.  We hope these instructions will help you stay on track for a fast and full recovery!   Increase activity slowly as tolerated   Complete by: As directed    Post-operative opioid taper instructions:   Complete by: As directed    POST-OPERATIVE OPIOID TAPER INSTRUCTIONS: It is important to wean off of your opioid medication as  soon as possible. If you do not need pain medication after your surgery it is ok to stop day one. Opioids include: Codeine, Hydrocodone(Norco, Vicodin), Oxycodone(Percocet, oxycontin) and hydromorphone amongst others.  Long term and even short term use of opiods can cause: Increased pain response Dependence Constipation Depression Respiratory depression And more.  Withdrawal symptoms can include Flu like symptoms Nausea, vomiting And more Techniques to manage these symptoms Hydrate well Eat regular healthy meals Stay active Use relaxation techniques(deep breathing, meditating, yoga) Do Not substitute Alcohol to help with tapering If you have been on opioids for less than two weeks and do not have pain than it is ok to stop all together.  Plan to wean off of opioids This plan should start within one week post op of your joint replacement. Maintain the same interval or time between taking each dose and first decrease the dose.  Cut the total daily intake of opioids by one tablet each day Next start to  increase the time between doses. The last dose that should be eliminated is the evening dose.           Follow-up Information     Marcene Corning, MD. Schedule an appointment as soon as possible for a visit in 2 week(s).   Specialty: Orthopedic Surgery Contact information: [address] [place] [address] [phone]                  Signed: Ginger Organ Erickson Yamashiro 01/23/2021, 8:10 AM

## 2021-01-23 NOTE — Progress Notes (Signed)
Physical Therapy Treatment Patient Details Name: Caitlyn [name] MRN: [id number] DOB: [birth year] Today's Date: 01/23/2021   History of Present Illness Pt is 70 yo female s/p L anterior THA on 01/22/21.  Pt with hx including but not limited to COPD, HTN, sleep apnea, Graves disease, gout, HLD, CAD with stent, PVD    PT Comments    Pt making good progress. She ambulated with improved foot clearance and was able to do 2' with close supervision.  Pt also performed steps to simulated entry to home and going into suken living room.  She had reported unsure about assist at home yesterday, but today reports confirmed with family and they plan to take turns staying with her.  Pt demonstrates safe gait & transfers in order to return home from PT perspective once discharged by MD.  While in hospital, will continue to benefit from PT for skilled therapy to advance mobility and exercises.      Recommendations for follow up therapy are one component of a multi-disciplinary discharge planning process, led by the attending physician.  Recommendations may be updated based on patient status, additional functional criteria and insurance authorization.  Follow Up Recommendations  Follow physician's recommendations for discharge plan and follow up therapies     Assistance Recommended at Discharge Frequent or constant Supervision/Assistance  Equipment Recommendations  Rolling walker (2 wheels)    Recommendations for Other Services       Precautions / Restrictions Precautions Precautions: Fall Restrictions LLE Weight Bearing: Weight bearing as tolerated     Mobility  Bed Mobility Overal bed mobility: Needs Assistance Bed Mobility: Supine to Sit     Supine to sit: Min assist     General bed mobility comments: in chair    Transfers Overall transfer level: Needs assistance Equipment used: Rolling walker (2 wheels) Transfers: Sit to/from Stand Sit to Stand: Supervision           General  transfer comment: Cues for hand placement and L LE management; increased time; performed x 2    Ambulation/Gait Ambulation/Gait assistance: Supervision Gait Distance (Feet): 80 Feet Assistive device: Rolling walker (2 wheels) Gait Pattern/deviations: Step-to pattern;Decreased stride length;Decreased weight shift to left Gait velocity: .2 ft/sec     General Gait Details: Improved foot clearance this afternoon; min cues for RW proximity; slight improvement in gait speed   Stairs Stairs: Yes Stairs assistance: Min guard   Number of Stairs: 3 General stair comments: Performed up/down 2 steps with bil rail, min guard, and cues for sequencing.  Performed up/down 1 step with RW backward and min guard and cues.   Wheelchair Mobility    Modified Rankin (Stroke Patients Only)       Balance Overall balance assessment: Needs assistance Sitting-balance support: No upper extremity supported Sitting balance-Leahy Scale: Normal     Standing balance support: No upper extremity supported;Bilateral upper extremity supported Standing balance-Leahy Scale: Fair Standing balance comment: RW to ambulate but able to stand at sink and wash face/brush teeth, and toielting ADLs without UE support                            Cognition Arousal/Alertness: Awake/alert Behavior During Therapy: WFL for tasks assessed/performed Overall Cognitive Status: Within Functional Limits for tasks assessed  Exercises Total Joint Exercises Ankle Circles/Pumps: AROM;Both;10 reps;Supine Quad Sets: AROM;Both;10 reps;Supine Heel Slides: AAROM;Left;10 reps;Supine Hip ABduction/ADduction: AAROM;Left;10 reps;Supine Long Arc Quad: AROM;Left;10 reps;Seated Other Exercises Other Exercises: on all educated on correct for; for hip abd and heel slides educated on gait belt for AAROM    General Comments General comments (skin integrity, edema, etc.):  At arrival reports BP recently 109/48 and pt concerned about lower number.  Checked and BP now in supine 121/49 with MAP 69; sitting 133/56 with MAP 80; standing 124/63 with MAP 80.  Asymptomatic and MAP alway >60.      Pertinent Vitals/Pain Pain Assessment: 0-10 Pain Score: 5  Pain Location: L hip Pain Descriptors / Indicators: Discomfort Pain Intervention(s): Limited activity within patient's tolerance;Monitored during session;Premedicated before session    Home Living                          Prior Function            PT Goals (current goals can now be found in the care plan section) Progress towards PT goals: Progressing toward goals    Frequency    7X/week      PT Plan Current plan remains appropriate    Co-evaluation              AM-PAC PT "6 Clicks" Mobility   Outcome Measure  Help needed turning from your back to your side while in a flat bed without using bedrails?: A Little Help needed moving from lying on your back to sitting on the side of a flat bed without using bedrails?: A Little Help needed moving to and from a bed to a chair (including a wheelchair)?: A Little Help needed standing up from a chair using your arms (e.g., wheelchair or bedside chair)?: A Little Help needed to walk in hospital room?: A Little Help needed climbing 3-5 steps with a railing? : A Little 6 Click Score: 18    End of Session Equipment Utilized During Treatment: Gait belt Activity Tolerance: Patient tolerated treatment well Patient left: with chair alarm set;in chair;with call bell/phone within reach Nurse Communication: Mobility status PT Visit Diagnosis: Other abnormalities of gait and mobility (R26.89);Muscle weakness (generalized) (M62.81)     Time: 8469-6295 PT Time Calculation (min) (ACUTE ONLY): 37 min  Charges:  $Gait Training: 8-22 mins  $Therapeutic Activity: 8-22 mins                     Anise Salvo, PT Acute Rehab Services Pager  [phone] [name] [phone]    Caitlyn [name] 01/23/2021, 3:03 PM

## 2021-01-23 NOTE — Progress Notes (Signed)
Subjective: 1 Day Post-Op Procedure(s) (LRB): LEFT TOTAL HIP ARTHROPLASTY ANTERIOR APPROACH (Left)  Patient is feeling much better this morning. Her pain is better controlled today.  Activity level:  wbat Diet tolerance:  ok Voiding:  ok Patient reports pain as mild.    Objective: Vital signs in last 24 hours: Temp:  [97.5 F (36.4 C)-98.8 F (37.1 C)] 98.7 F (37.1 C) (11/09 0511) Pulse Rate:  [44-72] 64 (11/09 0511) Resp:  [12-21] 18 (11/09 0511) BP: (99-212)/(49-96) 132/81 (11/09 0511) SpO2:  [99 %-100 %] 100 % (11/09 0511) Weight:  [109.9 kg] 109.9 kg (11/08 1739)  Labs: No results for input(s): HGB in the last 72 hours. No results for input(s): WBC, RBC, HCT, PLT in the last 72 hours. No results for input(s): NA, K, CL, CO2, BUN, CREATININE, GLUCOSE, CALCIUM in the last 72 hours. No results for input(s): LABPT, INR in the last 72 hours.  Physical Exam:  Neurologically intact ABD soft Neurovascular intact Sensation intact distally Intact pulses distally Dorsiflexion/Plantar flexion intact Incision: dressing C/D/I No cellulitis present Compartment soft  Assessment/Plan:  1 Day Post-Op Procedure(s) (LRB): LEFT TOTAL HIP ARTHROPLASTY ANTERIOR APPROACH (Left) Advance diet Up with therapy D/C IV fluids Discharge home with home health today after PT. Continue on 81mg  asa BID  for dvt prevention. Follow up in office 2 weeks post op.  Akeila Lana 01/23/2021, 7:59 AM

## 2021-01-23 NOTE — TOC Transition Note (Signed)
Transition of Care [facility]) - CM/SW Discharge Note  Patient Details  Name: Caitlyn [name] MRN: [id number] Date of Birth: Nov 30, 1950  [facility] CM/SW Contact:  Sherie Don, LCSW Phone Number: 01/23/2021, 9:34 AM  Clinical Narrative: Patient is expected to discharge home after working with PT. CSW met with patient and daughter to confirm discharge plan. Patient will discharge home with HHPT through [place]. Patient will get a rolling walker through [facility], but declined the 3N1 as she would have to private pay through [place]. Walker to be delivered to patient's room. TOC signing off.  Final next level of care: [place] Barriers to Discharge: No Barriers Identified  Patient Goals and CMS Choice Patient states their goals for this hospitalization and ongoing recovery are:: Discharge home with [place] CMS Medicare.gov Compare Post Acute Care list provided to:: Patient Choice offered to / list presented to : Patient  Discharge Plan and Services        DME Arranged: Walker rolling DME Agency: [employer] Representative spoke with at DME Agency: Prearranged in orthopedist's office HH Arranged: PT [place] Agency: [facility] spoke with at [place]: Prearranged in orthopedist's office  Readmission Risk Interventions Readmission Risk Prevention Plan 09/13/2018  Transportation Screening Complete  PCP or Specialist Appt within 5-7 Days Complete  Home Care Screening Complete  Medication Review (RN CM) Complete  Some recent data might be hidden

## 2021-01-23 NOTE — Progress Notes (Signed)
Discharge package printed and instructions given to pt. Patient verbalizes understanding. 

## 2021-01-23 NOTE — Plan of Care (Signed)
?  Problem: Activity: ?Goal: Risk for activity intolerance will decrease ?Outcome: Progressing ?  ?Problem: Safety: ?Goal: Ability to remain free from injury will improve ?Outcome: Progressing ?  ?Problem: Pain Managment: ?Goal: General experience of comfort will improve ?Outcome: Progressing ?  ?

## 2021-01-31 ENCOUNTER — Ambulatory Visit: Payer: Medicare Other | Admitting: Internal Medicine

## 2021-02-06 ENCOUNTER — Other Ambulatory Visit (INDEPENDENT_AMBULATORY_CARE_PROVIDER_SITE_OTHER): Payer: Self-pay | Admitting: Family Medicine

## 2021-02-06 DIAGNOSIS — R7303 Prediabetes: Secondary | ICD-10-CM

## 2021-02-11 NOTE — Telephone Encounter (Signed)
LOV with Dr. O

## 2021-02-13 ENCOUNTER — Other Ambulatory Visit (INDEPENDENT_AMBULATORY_CARE_PROVIDER_SITE_OTHER): Payer: Self-pay | Admitting: Family Medicine

## 2021-02-13 ENCOUNTER — Other Ambulatory Visit: Payer: Self-pay | Admitting: Family Medicine

## 2021-02-13 DIAGNOSIS — R7303 Prediabetes: Secondary | ICD-10-CM

## 2021-02-13 MED ORDER — SEMAGLUTIDE (1 MG/DOSE) 4 MG/3ML ~~LOC~~ SOPN: 1.0000 mg | PEN_INJECTOR | SUBCUTANEOUS | 3 refills | Status: DC

## 2021-02-13 NOTE — Telephone Encounter (Signed)
Refill request for: Ozempic 1 mg LR 01/02/21 LOV 01/09/21 FOV  none scheduled.   Please review and advise.  Thanks.  Dm/cma

## 2021-02-14 ENCOUNTER — Telehealth (INDEPENDENT_AMBULATORY_CARE_PROVIDER_SITE_OTHER): Payer: Self-pay

## 2021-02-14 DIAGNOSIS — M25652 Stiffness of left hip, not elsewhere classified: Secondary | ICD-10-CM | POA: Diagnosis not present

## 2021-02-14 DIAGNOSIS — Z96642 Presence of left artificial hip joint: Secondary | ICD-10-CM | POA: Diagnosis not present

## 2021-02-14 DIAGNOSIS — R531 Weakness: Secondary | ICD-10-CM | POA: Diagnosis not present

## 2021-02-14 NOTE — Telephone Encounter (Signed)
Patient is requesting a refill for Ozempic to be sent to Karin Golden at [facility].  She had hip surgery 11/8 and is not able to come to the office yet for a visit.  Her computer isn't working so doesn't have access to My Chart.  Thank you

## 2021-02-14 NOTE — Telephone Encounter (Signed)
Looks like rx has already been refill by Dr. Veto Kemps. I called the pharmacy to verify. Left a msg on patient machine that Dr Veto Kemps has refill this medication and the pharmacy is working on this rx for her if she have any further concerns to give [place] a return call.

## 2021-02-19 ENCOUNTER — Other Ambulatory Visit: Payer: Self-pay | Admitting: Family Medicine

## 2021-02-19 DIAGNOSIS — Z1231 Encounter for screening mammogram for malignant neoplasm of breast: Secondary | ICD-10-CM

## 2021-02-19 DIAGNOSIS — R531 Weakness: Secondary | ICD-10-CM | POA: Diagnosis not present

## 2021-02-19 DIAGNOSIS — M25652 Stiffness of left hip, not elsewhere classified: Secondary | ICD-10-CM | POA: Diagnosis not present

## 2021-02-19 DIAGNOSIS — Z96642 Presence of left artificial hip joint: Secondary | ICD-10-CM | POA: Diagnosis not present

## 2021-02-22 DIAGNOSIS — Z96642 Presence of left artificial hip joint: Secondary | ICD-10-CM | POA: Diagnosis not present

## 2021-02-22 DIAGNOSIS — M25652 Stiffness of left hip, not elsewhere classified: Secondary | ICD-10-CM | POA: Diagnosis not present

## 2021-02-22 DIAGNOSIS — R531 Weakness: Secondary | ICD-10-CM | POA: Diagnosis not present

## 2021-02-26 DIAGNOSIS — M25652 Stiffness of left hip, not elsewhere classified: Secondary | ICD-10-CM | POA: Diagnosis not present

## 2021-02-26 DIAGNOSIS — Z96642 Presence of left artificial hip joint: Secondary | ICD-10-CM | POA: Diagnosis not present

## 2021-02-26 DIAGNOSIS — R531 Weakness: Secondary | ICD-10-CM | POA: Diagnosis not present

## 2021-02-27 ENCOUNTER — Other Ambulatory Visit (INDEPENDENT_AMBULATORY_CARE_PROVIDER_SITE_OTHER): Payer: Self-pay | Admitting: Family Medicine

## 2021-02-27 DIAGNOSIS — K5909 Other constipation: Secondary | ICD-10-CM

## 2021-02-28 ENCOUNTER — Other Ambulatory Visit: Payer: Self-pay

## 2021-02-28 DIAGNOSIS — K5909 Other constipation: Secondary | ICD-10-CM

## 2021-02-28 NOTE — Telephone Encounter (Signed)
Received a refill request for: Linzess 145 mcg Caps LR 01/02/21, #30, 0 rfs LOV 01/09/21 FOV  none scheduled  Please review and advise.  Thanks Dm/cma

## 2021-03-01 MED ORDER — LINZESS 145 MCG PO CAPS: 145.0000 ug | ORAL_CAPSULE | Freq: Every day | ORAL | 11 refills | Status: AC

## 2021-03-05 DIAGNOSIS — R531 Weakness: Secondary | ICD-10-CM | POA: Diagnosis not present

## 2021-03-05 DIAGNOSIS — Z96642 Presence of left artificial hip joint: Secondary | ICD-10-CM | POA: Diagnosis not present

## 2021-03-05 DIAGNOSIS — M25652 Stiffness of left hip, not elsewhere classified: Secondary | ICD-10-CM | POA: Diagnosis not present

## 2021-03-06 NOTE — Progress Notes (Deleted)
HPI F former smoker followed for OSA, Insomnia, complicated by PVD, Hx DVT/ Eliquis, HTN, CAD/ MI, Carotid Stenosis/ L stent, Hypothyroid,  Multiple Pulmonary Nodules, Obesity, Covid infection June, 2020, hx Grave's Disease,  Office Spirometry 05/28/16- mild obstruction small airways. FEV1/FVC 0.74. HST 11/14/19- AHI 44.9/ hr, desaturation to 80%, body weight 263 lbs =================================================    10/29/20- 69 yoF former smoker followed for OSA, Insomnia, complicated by PVD, Hx DVT/ Eliquis, HTN, CAD/ MI, Carotid Stenosis/ L stent, Hypothyroid,  Multiple Pulmonary Nodules, Obesity, Covid infection June, 2020, DM2,  -Ambien 5 mg CPAP  / [facility]             originally from Dr [name] ? settings Download- Body weight today-250 lbs Covid vax-2 Phizer Not using CPAP. Sleepy in daytime. Wakes in AM still tired and sneezing/ dry nose.  Denies cough/ wheeze. She understands she will need update sleep study to requalify for CPAP.  Addendum for Pulmonary Clearance 12/26/20-  COPD- mild- FEV1/FVC 0.74 on 05/28/16 Obstructive sleep apnea moderate - AHI 20.1/ hr with desaturation to 78% on NPSG 12/13/20. Requalifies for CPAP and will recommend CPAP auto 5-15/ [facility]. She is at mild pulmonary risk from OSA and COPD for anticpated THR. Will need CPAP auto 5-20 in Recovery. Ok to proceed with planned surgery from Pulmonary standpoint. Consultation will be available if needed.  Cardiac Risk assessment will need to be from Cardiology.   03/07/21- 70 yoF former smoker followed for OSA, Insomnia, complicated by PVD, Hx DVT/ Eliquis, HTN, CAD/ MI, Carotid Stenosis/ L stent, Hypothyroid,  Multiple Pulmonary Nodules, Obesity, Covid infection June, 2020, DM2,  -Ambien 5 mg                     Original CPAP from Dr [name] Body weight today- Covid vax-2 Phizer Flu vax- NPSG 12/13/20- AHI 20.1/ hr, desaturation to 78%, body weight 240 lbs,  CPAP auto 5-15/ [facility]    ordered 12/31/20 [facility]  Left hip in November   CXR 01/17/21- IMPRESSION: Enlargement of cardiac silhouette. Chronic bronchitic changes.    ROS-see HPI   + = positive Constitutional:    weight loss, night sweats, fevers, chills, +fatigue, lassitude. HEENT:    headaches, difficulty swallowing, tooth/dental problems, sore throat,       +sneezing, itching, ear ache, nasal congestion, post nasal drip, snoring CV:    chest pain, orthopnea, PND, swelling in lower extremities, anasarca,                                   dizziness, palpitations Resp:   shortness of breath with exertion or at rest.                productive cough,   non-productive cough, coughing up of blood.              change in color of mucus.  wheezing.   Skin:    rash or lesions. GI:  No-   heartburn, indigestion, abdominal pain, nausea, vomiting, diarrhea,                 change in bowel habits, loss of appetite GU: dysuria, change in color of urine, no urgency or frequency.   flank pain. MS:   +joint pain, stiffness, decreased range of motion, back pain. Neuro-     nothing unusual Psych:  change in mood or affect.  depression or anxiety.  memory loss.  OBJ- Physical Exam General- Alert, Oriented, Affect-appropriate, Distress- none acute, + obese Skin- rash-none, lesions- none, excoriation- none Lymphadenopathy- none Head- atraumatic            Eyes- Gross vision intact, PERRLA, conjunctivae and secretions clear            Ears- Hearing, canals-normal            Nose- Clear, no-Septal dev, mucus, polyps, erosion, perforation             Throat- Mallampati IV , mucosa clear , drainage- none, tonsils- atrophic,  +much dental repair, Neck- flexible , trachea midline, no stridor , thyroid nl, carotid no bruit Chest - symmetrical excursion , unlabored           Heart/CV- RRR , no murmur , no gallop  , no rub, nl s1 s2                           - JVD- none , edema- none, stasis changes- none, varices- none           Lung- clear to P&A,  wheeze- none, cough- none , dullness-none, rub- none           Chest wall-  Abd-  Br/ Gen/ Rectal- Not done, not indicated Extrem- cyanosis- none, clubbing, none, atrophy- none, strength- nl Neuro- grossly intact to observation

## 2021-03-07 ENCOUNTER — Ambulatory Visit: Payer: Medicare Other | Admitting: Internal Medicine

## 2021-03-07 ENCOUNTER — Telehealth: Payer: Self-pay | Admitting: *Deleted

## 2021-03-07 NOTE — Telephone Encounter (Signed)
ATC patient x1.  LVM to fax DL to [phone] or give access to [facility] if they have a new machine.  Last DL in [facility] is 87 months old.  Will await fax.

## 2021-03-08 ENCOUNTER — Ambulatory Visit: Payer: Medicare Other | Admitting: Adult Health

## 2021-03-12 DIAGNOSIS — Z96642 Presence of left artificial hip joint: Secondary | ICD-10-CM | POA: Diagnosis not present

## 2021-03-12 DIAGNOSIS — R531 Weakness: Secondary | ICD-10-CM | POA: Diagnosis not present

## 2021-03-12 DIAGNOSIS — M25652 Stiffness of left hip, not elsewhere classified: Secondary | ICD-10-CM | POA: Diagnosis not present

## 2021-03-19 ENCOUNTER — Encounter: Payer: Self-pay | Admitting: Primary Care

## 2021-03-19 ENCOUNTER — Other Ambulatory Visit: Payer: Self-pay

## 2021-03-19 ENCOUNTER — Ambulatory Visit (INDEPENDENT_AMBULATORY_CARE_PROVIDER_SITE_OTHER): Payer: Commercial Managed Care - HMO | Admitting: Primary Care

## 2021-03-19 VITALS — BP 138/86 | HR 60 | Temp 97.6°F | Ht 65.0 in | Wt 235.0 lb

## 2021-03-19 DIAGNOSIS — M25652 Stiffness of left hip, not elsewhere classified: Secondary | ICD-10-CM | POA: Diagnosis not present

## 2021-03-19 DIAGNOSIS — R531 Weakness: Secondary | ICD-10-CM | POA: Diagnosis not present

## 2021-03-19 DIAGNOSIS — J449 Chronic obstructive pulmonary disease, unspecified: Secondary | ICD-10-CM | POA: Diagnosis not present

## 2021-03-19 DIAGNOSIS — G4733 Obstructive sleep apnea (adult) (pediatric): Secondary | ICD-10-CM

## 2021-03-19 DIAGNOSIS — F5101 Primary insomnia: Secondary | ICD-10-CM

## 2021-03-19 DIAGNOSIS — Z96642 Presence of left artificial hip joint: Secondary | ICD-10-CM | POA: Diagnosis not present

## 2021-03-19 NOTE — Assessment & Plan Note (Addendum)
-   Patient continues to struggle with consistent CPAP use d/t nasal congestion. Advised she add OTC nasal steroid spray such as Flonase along with ocean saline rinses 1-2 times a day prn congestion. Also recommed we lower CPAP pressure setting to 5-15cm h20 for comfort. If continues to have issues with CPAP tolerance may want to consider referral to orthodontics for oral appliance along with encouraging weight loss. She is not interested in ENT referral for inspire device a tthis time. FU in 6 months with Dr. Annamaria Boots or sooner if needed.

## 2021-03-19 NOTE — Patient Instructions (Signed)
Recommendations: - Aim to wear CPAP every night for min 4-6 hours or longer  - Start using OTC flonase nasal spray daily for nasal congestion. You can also use saline nasal rinses twice a day as needed - If not tolerating new CPAP setting please let us know and we can refer you for oral appliance   Orders: - Please change CPAP setting 5-15cm h20  (please bring CPAP machine to [facility] [phone] / [phone])  Follow-up: - 6 months with Dr. Maple Hudson or sooner if needed    CPAP and BIPAP Information CPAP and BIPAP are methods that use air pressure to keep your airways open and to help you breathe well. CPAP and BIPAP use different amounts of pressure. Your health care provider will tell you whether CPAP or BIPAP would be more helpful for you. CPAP stands for "continuous positive airway pressure." With CPAP, the amount of pressure stays the same while you breathe in (inhale) and out (exhale). BIPAP stands for "bi-level positive airway pressure." With BIPAP, the amount of pressure will be higher when you inhale and lower when you exhale. This allows you to take larger breaths. CPAP or BIPAP may be used in the hospital, or your health care provider may want you to use it at home. You may need to have a sleep study before your health care provider can order a machine for you to use at home. What are the advantages? CPAP or BIPAP can be helpful if you have: Sleep apnea. Chronic obstructive pulmonary disease (COPD). Heart failure. Medical conditions that cause muscle weakness, including muscular dystrophy or amyotrophic lateral sclerosis (ALS). Other problems that cause breathing to be shallow, weak, abnormal, or difficult. CPAP and BIPAP are most commonly used for obstructive sleep apnea (OSA) to keep the airways from collapsing when the muscles relax during sleep. What are the risks? Generally, this is a safe treatment. However, problems may occur, including: Irritated skin or skin  sores if the mask does not fit properly. Dry or stuffy nose or nosebleeds. Dry mouth. Feeling gassy or bloated. Sinus or lung infection if the equipment is not cleaned properly. When should CPAP or BIPAP be used? In most cases, the mask only needs to be worn during sleep. Generally, the mask needs to be worn throughout the night and during any daytime naps. People with certain medical conditions may also need to wear the mask at other times, such as when they are awake. Follow instructions from your health care provider about when to use the machine. What happens during CPAP or BIPAP? Both CPAP and BIPAP are provided by a small machine with a flexible plastic tube that attaches to a plastic mask that you wear. Air is blown through the mask into your nose or mouth. The amount of pressure that is used to blow the air can be adjusted on the machine. Your health care provider will set the pressure setting and help you find the best mask for you. Tips for using the mask Because the mask needs to be snug, some people feel trapped or closed-in (claustrophobic) when first using the mask. If you feel this way, you may need to get used to the mask. One way to do this is to hold the mask loosely over your nose or mouth and then gradually apply the mask more snugly. You can also gradually increase the amount of time that you use the mask. Masks are available in various types and sizes. If your mask does  not fit well, talk with your health care provider about getting a different one. Some common types of masks include: Full face masks, which fit over the mouth and nose. Nasal masks, which fit over the nose. Nasal pillow or prong masks, which fit into the nostrils. If you are using a mask that fits over your nose and you tend to breathe through your mouth, a chin strap may be applied to help keep your mouth closed. Use a skin barrier to protect your skin as told by your health care provider. Some CPAP and BIPAP  machines have alarms that may sound if the mask comes off or develops a leak. If you have trouble with the mask, it is very important that you talk with your health care provider about finding a way to make the mask easier to tolerate. Do not stop using the mask. There could be a negative impact on your health if you stop using the mask. Tips for using the machine Place your CPAP or BIPAP machine on a secure table or stand near an electrical outlet. Know where the on/off switch is on the machine. Follow instructions from your health care provider about how to set the pressure on your machine and when you should use it. Do not eat or drink while the CPAP or BIPAP machine is on. Food or fluids could get pushed into your lungs by the pressure of the CPAP or BIPAP. For home use, CPAP and BIPAP machines can be rented or purchased through home health care companies. Many different brands of machines are available. Renting a machine before purchasing may help you find out which particular machine works well for you. Your health insurance company may also decide which machine you may get. Keep the CPAP or BIPAP machine and attachments clean. Ask your health care provider for specific instructions. Check the humidifier if you have a dry stuffy nose or nosebleeds. Make sure it is working correctly. Follow these instructions at home: Take over-the-counter and prescription medicines only as told by your health care provider. Ask if you can take sinus medicine if your sinuses are blocked. Do not use any products that contain nicotine or tobacco. These products include cigarettes, chewing tobacco, and vaping devices, such as e-cigarettes. If you need help quitting, ask your health care provider. Keep all follow-up visits. This is important. Contact a health care provider if: You have redness or pressure sores on your head, face, mouth, or nose from the mask or head gear. You have trouble using the CPAP or BIPAP  machine. You cannot tolerate wearing the CPAP or BIPAP mask. Someone tells you that you snore even when wearing your CPAP or BIPAP. Get help right away if: You have trouble breathing. You feel confused. Summary CPAP and BIPAP are methods that use air pressure to keep your airways open and to help you breathe well. If you have trouble with the mask, it is very important that you talk with your health care provider about finding a way to make the mask easier to tolerate. Do not stop using the mask. There could be a negative impact to your health if you stop using the mask. Follow instructions from your health care provider about when to use the machine. This information is not intended to replace advice given to you by your health care provider. Make sure you discuss any questions you have with your health care provider. Document Revised: 10/10/2020 Document Reviewed: 02/10/2020 Elsevier Patient Education  2022 [employer].

## 2021-03-19 NOTE — Assessment & Plan Note (Signed)
-   Rarely takes Ambien 5mg  prn insomnia, last filled May 2022

## 2021-03-19 NOTE — Progress Notes (Signed)
@Patient  ID: , female    DOB: 04-Aug-1950, 71 y.o.   MRN: [id number]  Chief Complaint  Patient presents with   Follow-up    Patient has no complaints.     Referring provider: [id number], DO  HPI: 71 year old female, former smoker quit in April 2011 (35 pack year hx). PMH significant for HTN, PVD, hx DVT, CAD/MI, carotid stenosis/L stene, OSA, hypothroid, multiple pulmonary nodules, seasonal and perennial allergic rhinitis. Patient of Dr. May 2011, last seen 01/23/20. NPSG 12/13/20 >>  AHI 20.1/ hr with desaturation to 78%. DME company is [facility].   Previous [facility] pulmonary encounter: 01/23/20- 68 yoF former smoker followed for OSA, Insomnia, complicated by PVD, Hx DVT/ Eliquis, HTN, CAD/ MI, Carotid Stenosis/ L stent, Hypothyroid,  Multiple Pulmonary Nodules, Obesity, Covid infection June, 2020,  HST 11/14/19- AHI 44.9/ hr, desaturation to 80%, body weight 263 lbs Body weight today 258 lbs Covid vax- 2 Phizer Flu vax- had CPAP   [facility] Discussed use of previous CPAP from Dr 11/16/19- using again. We need download from current machine. Will manage based on latest sleep study. Somatic bone and joint pains impact sleep.  Nasal congestion and drainage vary- partly seasonal.    07/31/2020 Patient presents today for 6 month follow-up insomnia/OSA. She was under the impression that she no longer had sleep apnea. She has not been wearing CPAP. She has a difficult times falling and staying asleep. She had a repeat home sleep study in August 2021 that showed severe OSA with AHI 44.9/hour. She is interested in inspire device, current BMI is 41. She is attending healthy weight and wellness. She is following diet plan and drinking premiere protein shakes 1-2 times a day.   10/29/20- 69 yoF former smoker followed for OSA, Insomnia, complicated by PVD, Hx DVT/ Eliquis, HTN, CAD/ MI, Carotid Stenosis/ L stent, Hypothyroid,  Multiple Pulmonary Nodules, Obesity, Covid infection June, 2020, DM2,   -Ambien 5 mg CPAP  / [facility]             originally from Dr 08-05-1993 ? settings Download- Body weight today-250 lbs Covid vax-2 Phizer Not using CPAP. Sleepy in daytime. Wakes in AM still tired and sneezing/ dry nose.  Denies cough/ wheeze. She understands she will need update sleep study to requalify for CPAP.  Addendum for Pulmonary Clearance 12/26/20-  COPD- mild- FEV1/FVC 0.74 on 05/28/16 Obstructive sleep apnea moderate - AHI 20.1/ hr with desaturation to 78% on NPSG 12/13/20. Requalifies for CPAP and will recommend CPAP auto 5-15/ [facility]. She is at mild pulmonary risk from OSA and COPD for anticpated THR. Will need CPAP auto 5-20 in Recovery. Ok to proceed with planned surgery from Pulmonary standpoint. Consultation will be available if needed.  Cardiac Risk assessment will need to be from Cardiology.    03/19/2021- Interim hx  Patient presents today for 3 month follow-up/ OSA, insomnia and mild COPD.   During her last visit with Dr. 05/17/2021 she was noted to be non-compliant with her CPAP. She underwent an updated Split night sleep which study showed moderate OSA, average AHI 20/hr. RX new  CPAP auto 5-15cm h20 was sent to [facility] in October 2022. She did not receive or want new CPAP machine. Pressure settings were never changed. She continues to not consistently wearing her CPAP with an average use of 19% over the last 90 days. Pressure 4-20cm h20 with residual AHI 1.5. She last filled prescription for Ambien 5mg  in May 2022, she takes this very  rarely for insomnia symptoms. She is motivated to lose weight. She is not interested in inspire device but may consider oral appliance if she continues to poorly tolerate CPAP.  Respiratory wise she is doing well. No acute complaints. Denies shortness of breath, chest tightness, wheezing or cough. She is on no maintenance regimen for mild COPD.   Imaging:  10/30/20 CXR>> Interval resolution of the peripheral ill-defined airspace opacity seen previously.  Mild coarse perihilar opacities persist. Heart size upper limits normal. Aortic Atherosclerosis  01/16/21 CXR >> Enlargement of cardiac silhouette. Mediastinal contours and pulmonary vascularity normal.Atherosclerotic calcification aorta.Chronic bronchitic changes without pulmonary infiltrate, pleural effusion, or pneumothorax.   Allergies  Allergen Reactions   Amlodipine Swelling   Lisinopril Cough   Statins Other (See Comments)    Myalgia    Immunization History  Administered Date(s) Administered   Fluad Quad(high Dose 65+) 01/09/2021   Influenza Split 03/24/2016, 01/07/2017   Influenza-Unspecified 01/02/2020   PFIZER(Purple Top)SARS-COV-2 Vaccination 12/06/2019, 01/23/2020, 01/23/2020   Pneumococcal Conjugate-13 01/24/2016   Pneumococcal-Unspecified 01/24/2016   Tdap 04/11/2016    Past Medical History:  Diagnosis Date   Anxiety    Arthritis    "legs" (01/07/2016)   Back pain    Back pain    CAD 06/2009   a.  s/p MI 4/11: tx with DES x 2 to RCA;  b.  LHC 03/17/11: LAD 30-40%, distal LAD 30-40%, OM2 40-50%, RCA stent patent, EF greater than 70%.;  c.  Lexiscan Myoview (12/15):  Mild apical thinning, no ischemia, EF 61%; normal study   CAROTID STENOSIS    carotid Dopplers 03/25/11: RICA 60-79%; LICA 0-39%.  Follow up recommended in 6 months.   Constipation    Constipation    Depression    Gout    "on RX for it" (01/07/2016)   Heart disease    History of heart attack    Hx of blood clots    HYPERLIPIDEMIA    HYPERTENSION    HYPOTHYROIDISM    post ablation tx Graves   Hypothyroidism    Joint pain    Myocardial infarction [facility](HCC) 2012   "mini"   OSA on CPAP    Osteoarthritis    Pre-diabetes    PVD    Rheumatoid arthritis (HCC)    Sleep apnea    SOBOE (shortness of breath on exertion)    TOBACCO ABUSE quit 06/2009   Varicose veins    VITAMIN D DEFICIENCY    DEXA 04/2009 normal    Tobacco History: Social History   Tobacco Use  Smoking Status Former    Packs/day: 1.00   Years: 35.00   Pack years: 35.00   Types: Cigarettes   Quit date: 06/30/2009   Years since quitting: 11.7  Smokeless Tobacco Never   Counseling given: Not Answered   Outpatient Medications Prior to Visit  Medication Sig Dispense Refill   allopurinol (ZYLOPRIM) 300 MG tablet Take 300 mg by mouth daily.     aspirin EC 81 MG tablet Take 1 tablet (81 mg total) by mouth 2 (two) times daily after a meal. Swallow whole. 45 tablet 0   carvedilol (COREG) 6.25 MG tablet Take 1 tablet (6.25 mg total) by mouth 2 (two) times daily with a meal. 180 tablet 1   Cholecalciferol (VITAMIN D) 50 MCG (2000 UT) tablet Take 1 tablet (2,000 Units total) by mouth daily.     colchicine 0.6 MG tablet Take 1.2 mg by mouth daily as needed (Gout flare ups).  furosemide (LASIX) 40 MG tablet Take 40 mg by mouth daily.     HYDROcodone-acetaminophen (NORCO/VICODIN) 5-325 MG tablet Take 1-2 tablets by mouth every 6 (six) hours as needed for moderate pain or severe pain (post op pain). 40 tablet 0   levothyroxine (SYNTHROID, LEVOTHROID) 125 MCG tablet Take 125 mcg by mouth daily before breakfast.     LINZESS 145 MCG CAPS capsule Take 1 capsule (145 mcg total) by mouth daily. 30 capsule 11   losartan (COZAAR) 50 MG tablet Take 1 tablet (50 mg total) by mouth daily. 30 tablet 0   nitroGLYCERIN (NITROSTAT) 0.4 MG SL tablet Place 0.4 mg under the tongue every 5 (five) minutes as needed for chest pain.     potassium chloride (KLOR-CON) 10 MEQ tablet Take 1 tablet (10 mEq total) by mouth daily as needed. Taking 1 tablet when taking Lasix (Patient taking differently: Take 10 mEq by mouth daily.) 30 tablet 0   rosuvastatin (CRESTOR) 10 MG tablet One tab po every other night b-4 bed (Patient taking differently: Take 10 mg by mouth every other day. Every other day at bedtime)     Selenium 200 MCG CAPS Take 200 mcg by mouth daily.     Semaglutide, 1 MG/DOSE, 4 MG/3ML SOPN Inject 1 mg into the skin every 7 (seven)  days. 1 mg wkly 3 mL 3   tiZANidine (ZANAFLEX) 4 MG tablet Take 1 tablet (4 mg total) by mouth every 6 (six) hours as needed for muscle spasms. 40 tablet 1   Vitamin D, Ergocalciferol, (DRISDOL) 1.25 MG (50000 UNIT) CAPS capsule 1 PO Q 5 DAYS (Patient taking differently: Take 50,000 Units by mouth every 7 (seven) days.) 6 capsule 0   zinc gluconate 50 MG tablet Take 50 mg by mouth daily.     No facility-administered medications prior to visit.   Review of Systems  Review of Systems  Constitutional: Negative.   HENT:  Positive for congestion.        Nasal congestion   Respiratory:  Negative for cough, chest tightness, shortness of breath and wheezing.     Physical Exam  BP 138/86 (BP Location: Right Arm, Patient Position: Sitting, Cuff Size: Normal)    Pulse 60    Temp 97.6 F (36.4 C) (Oral)    Ht 5\' 5"  (1.651 m)    Wt 235 lb (106.6 kg)    SpO2 98%    BMI 39.11 kg/m  Physical Exam Constitutional:      Appearance: Normal appearance. She is obese.  HENT:     Head: Normocephalic and atraumatic.     Nose: Nose normal.     Mouth/Throat:     Mouth: Mucous membranes are moist.     Pharynx: Oropharynx is clear.  Cardiovascular:     Rate and Rhythm: Normal rate and regular rhythm.  Pulmonary:     Effort: Pulmonary effort is normal.     Breath sounds: Normal breath sounds. No wheezing, rhonchi or rales.  Musculoskeletal:     Cervical back: Normal range of motion and neck supple.  Skin:    General: Skin is warm and dry.  Neurological:     General: No focal deficit present.     Mental Status: She is alert and oriented to person, place, and time. Mental status is at baseline.  Psychiatric:        Mood and Affect: Mood normal.        Behavior: Behavior normal.        Thought  Content: Thought content normal.        Judgment: Judgment normal.     Lab Results:  CBC    Component Value Date/Time   WBC 7.3 01/16/2021 1449   RBC 4.39 01/16/2021 1449   HGB 14.0 01/16/2021 1449    HGB 14.4 05/03/2020 0933   HCT 42.8 01/16/2021 1449   HCT 44.6 05/03/2020 0933   PLT 219 01/16/2021 1449   PLT 232 05/03/2020 0933   MCV 97.5 01/16/2021 1449   MCV 93 05/03/2020 0933   MCH 31.9 01/16/2021 1449   MCHC 32.7 01/16/2021 1449   RDW 14.5 01/16/2021 1449   RDW 13.4 05/03/2020 0933   LYMPHSABS 2.2 01/16/2021 1449   LYMPHSABS 1.6 05/03/2020 0933   MONOABS 0.5 01/16/2021 1449   EOSABS 0.1 01/16/2021 1449   EOSABS 0.2 05/03/2020 0933   BASOSABS 0.0 01/16/2021 1449   BASOSABS 0.0 05/03/2020 0933    BMET    Component Value Date/Time   NA 137 01/16/2021 1449   NA 141 11/21/2020 0901   K 3.8 01/16/2021 1449   CL 106 01/16/2021 1449   CO2 28 01/16/2021 1449   GLUCOSE 87 01/16/2021 1449   BUN 27 (H) 01/16/2021 1449   BUN 25 11/21/2020 0901   CREATININE 0.89 01/16/2021 1449   CALCIUM 9.2 01/16/2021 1449   GFRNONAA >60 01/16/2021 1449   GFRAA 79 05/03/2020 0933    BNP    Component Value Date/Time   BNP 52.3 09/12/2018 0412    ProBNP No results found for: PROBNP  Imaging: No results found.   Assessment & Plan:   Obstructive sleep apnea - Patient continues to struggle with consistent CPAP use d/t nasal congestion. Advised she add OTC nasal steroid spray such as Flonase along with ocean saline rinses 1-2 times a day prn congestion. Also recommed we lower CPAP pressure setting to 5-15cm h20 for comfort. If continues to have issues with CPAP tolerance may want to consider referral to orthodontics for oral appliance along with encouraging weight loss. She is not interested in ENT referral for inspire device a tthis time. FU in 6 months with Dr. Maple HudsonYoung or sooner if needed.  COPD mixed type [facility](HCC) - Former smoker. Patient is asymptomatic, not currently on maintenance treatment. Continue to monitor.   Insomnia - Rarely takes Ambien 5mg  prn insomnia, last filled May 2022    Glenford BayleyElizabeth W Raelee Rossmann, NP 03/19/2021

## 2021-03-19 NOTE — Assessment & Plan Note (Addendum)
-   Former smoker. Patient is asymptomatic, not currently on maintenance treatment. Continue to monitor.

## 2021-03-21 ENCOUNTER — Ambulatory Visit (INDEPENDENT_AMBULATORY_CARE_PROVIDER_SITE_OTHER): Payer: Commercial Managed Care - HMO | Admitting: Nurse Practitioner

## 2021-03-21 ENCOUNTER — Encounter: Payer: Self-pay | Admitting: *Deleted

## 2021-03-21 ENCOUNTER — Other Ambulatory Visit: Payer: Self-pay

## 2021-03-21 ENCOUNTER — Other Ambulatory Visit: Payer: Self-pay | Admitting: Nurse Practitioner

## 2021-03-21 VITALS — BP 138/96 | HR 60 | Temp 97.6°F | Resp 14 | Ht 65.0 in | Wt 233.0 lb

## 2021-03-21 DIAGNOSIS — N390 Urinary tract infection, site not specified: Secondary | ICD-10-CM

## 2021-03-21 DIAGNOSIS — R1011 Right upper quadrant pain: Secondary | ICD-10-CM

## 2021-03-21 DIAGNOSIS — N898 Other specified noninflammatory disorders of vagina: Secondary | ICD-10-CM

## 2021-03-21 DIAGNOSIS — R8290 Unspecified abnormal findings in urine: Secondary | ICD-10-CM | POA: Diagnosis not present

## 2021-03-21 LAB — CBC WITH DIFFERENTIAL/PLATELET
Basophils Absolute: 0 10*3/uL (ref 0.0–0.1)
Basophils Relative: 0.6 % (ref 0.0–3.0)
Eosinophils Absolute: 0.1 10*3/uL (ref 0.0–0.7)
Eosinophils Relative: 2.2 % (ref 0.0–5.0)
HCT: 42 % (ref 36.0–46.0)
Hemoglobin: 13.7 g/dL (ref 12.0–15.0)
Lymphocytes Relative: 22.7 % (ref 12.0–46.0)
Lymphs Abs: 1.2 10*3/uL (ref 0.7–4.0)
MCHC: 32.6 g/dL (ref 30.0–36.0)
MCV: 93.9 fl (ref 78.0–100.0)
Monocytes Absolute: 0.3 10*3/uL (ref 0.1–1.0)
Monocytes Relative: 6.4 % (ref 3.0–12.0)
Neutro Abs: 3.7 10*3/uL (ref 1.4–7.7)
Neutrophils Relative %: 68.1 % (ref 43.0–77.0)
Platelets: 214 10*3/uL (ref 150.0–400.0)
RBC: 4.47 Mil/uL (ref 3.87–5.11)
RDW: 14.5 % (ref 11.5–15.5)
WBC: 5.4 10*3/uL (ref 4.0–10.5)

## 2021-03-21 LAB — COMPREHENSIVE METABOLIC PANEL
ALT: 9 U/L (ref 0–35)
AST: 15 U/L (ref 0–37)
Albumin: 4 g/dL (ref 3.5–5.2)
Alkaline Phosphatase: 107 U/L (ref 39–117)
BUN: 21 mg/dL (ref 6–23)
CO2: 29 mEq/L (ref 19–32)
Calcium: 9.8 mg/dL (ref 8.4–10.5)
Chloride: 104 mEq/L (ref 96–112)
Creatinine, Ser: 0.85 mg/dL (ref 0.40–1.20)
GFR: 70.54 mL/min (ref >60.00)
Glucose, Bld: 76 mg/dL (ref 70–99)
Potassium: 4 mEq/L (ref 3.5–5.1)
Sodium: 138 mEq/L (ref 135–145)
Total Bilirubin: 0.4 mg/dL (ref 0.2–1.2)
Total Protein: 7.7 g/dL (ref 6.0–8.3)

## 2021-03-21 LAB — POCT URINALYSIS DIPSTICK
Bilirubin, UA: NEGATIVE
Glucose, UA: NEGATIVE
Ketones, UA: NEGATIVE
Nitrite, UA: NEGATIVE
Protein, UA: POSITIVE — AB
Spec Grav, UA: 1.02 (ref 1.010–1.025)
Urobilinogen, UA: 0.2 E.U./dL
pH, UA: 5.5 (ref 5.0–8.0)

## 2021-03-21 LAB — URINALYSIS, MICROSCOPIC ONLY

## 2021-03-21 LAB — LIPASE: Lipase: 34 U/L (ref 11.0–59.0)

## 2021-03-21 MED ORDER — SULFAMETHOXAZOLE-TRIMETHOPRIM 800-160 MG PO TABS: 1.0000 | ORAL_TABLET | Freq: Two times a day (BID) | ORAL | 0 refills | Status: AC

## 2021-03-21 NOTE — Assessment & Plan Note (Signed)
Patient is having right upper quadrant pain after physical exam and questioning.  Patient states she does still have her gallbladder.  Pending lab results and ultrasound of abdomen

## 2021-03-21 NOTE — Progress Notes (Signed)
Called and reviewed labs with patient over the phone

## 2021-03-21 NOTE — Progress Notes (Signed)
Acute Office Visit  Subjective:    Patient ID: Caitlyn [name], female    DOB: 08-31-1950, 71 y.o.   MRN: [id number]  Chief Complaint  Patient presents with   Abdominal Pain    Right side, started on 03/13/21. No trauma or injury except for going up two flights of stairs with her grandson and doing more walking that day and then the pain started. Pain does not radiate. Pain is described as achy.     Patient is in today for Right side pain  Pain started on 03/13/2021 States that she went to grandsons house who lives on the third floor. After she went up and down the stairs she noticed the discomfort. Described as sharp stabbing with movement and a dull ache at rest  Constant but cannot get it to go away. States that movement and twisting can make it worse. Rest makes it better  Has been using heat, tylenol, and muscle relaxer with little effect  Sees surgeon tomorrow for follow up on left hip surgery.  Also hx of DVT right leg Past Medical History:  Diagnosis Date   Anxiety    Arthritis    "legs" (01/07/2016)   Back pain    Back pain    CAD 06/2009   a.  s/p MI 4/11: tx with DES x 2 to RCA;  b.  LHC 03/17/11: LAD 30-40%, distal LAD 30-40%, OM2 40-50%, RCA stent patent, EF greater than 70%.;  c.  Lexiscan Myoview (12/15):  Mild apical thinning, no ischemia, EF 61%; normal study   CAROTID STENOSIS    carotid Dopplers 08/19/77: RICA 03-83%; LICA 3-38%.  Follow up recommended in 6 months.   Constipation    Constipation    Depression    Gout    "on RX for it" (01/07/2016)   Heart disease    History of heart attack    Hx of blood clots    HYPERLIPIDEMIA    HYPERTENSION    HYPOTHYROIDISM    post ablation tx Graves   Hypothyroidism    Joint pain    Myocardial infarction [facility]) 2012   "mini"   OSA on CPAP    Osteoarthritis    Pre-diabetes    PVD    Rheumatoid arthritis (Home)    Sleep apnea    SOBOE (shortness of breath on exertion)    TOBACCO ABUSE quit 06/2009    Varicose veins    VITAMIN D DEFICIENCY    DEXA 04/2009 normal    Past Surgical History:  Procedure Laterality Date   CARDIAC CATHETERIZATION N/A 01/07/2016   Procedure: Left Heart Cath and Coronary Angiography;  Surgeon: Charolette Forward, MD;  Location: [place] [facility];  Service: Cardiovascular;  Laterality: N/A;   CARDIAC CATHETERIZATION N/A 01/07/2016   Procedure: Coronary Stent Intervention;  Surgeon: Charolette Forward, MD;  Location: [place] [facility];  Service: Cardiovascular;  Laterality: N/A;   CAROTID STENT Left    bilateral carotid artery disease post stent of left carotid   CESAREAN SECTION  1985   "twins"   CORONARY ANGIOPLASTY     LEFT HEART CATHETERIZATION WITH CORONARY ANGIOGRAM N/A 03/17/2011   Procedure: LEFT HEART CATHETERIZATION WITH CORONARY ANGIOGRAM;  Surgeon: Hillary Bow, MD;  Location: [facility] CATH LAB;  Service: Cardiovascular;  Laterality: N/A;   PERIPHERAL VASCULAR CATHETERIZATION N/A 04/06/2015   Procedure: Abdominal Aortogram;  Surgeon: Elam Dutch, MD;  Location: [place] [facility];  Service: Cardiovascular;  Laterality: N/A;   RADIOACTIVE PLAQUE  INSERTION     Graves disease post radioactive treatment complicated hypothyroidism   TOTAL HIP ARTHROPLASTY Left 01/22/2021   Procedure: LEFT TOTAL HIP ARTHROPLASTY ANTERIOR APPROACH;  Surgeon: Melrose Nakayama, MD;  Location: [facility];  Service: Orthopedics;  Laterality: Left;    Family History  Problem Relation Age of Onset   Heart attack Mother    Stroke Mother    Cancer Mother    Varicose Veins Mother    Thyroid disease Mother    Hypertension Mother    Heart disease Mother    Sudden death Mother    Depression Mother    Cancer Father    Heart disease Father    Hypertension Father    Coronary artery disease Other    Hypertension Other    Hypertension Sister    Breast cancer Paternal Aunt     Social History   Socioeconomic History   Marital status: Divorced    Spouse name: Not on file   Number of  children: Not on file   Years of education: Not on file   Highest education level: Not on file  Occupational History   Occupation: retired, Actuary for mental health  Tobacco Use   Smoking status: Former    Packs/day: 1.00    Years: 35.00    Pack years: 35.00    Types: Cigarettes    Quit date: 06/30/2009    Years since quitting: 11.7   Smokeless tobacco: Never  Vaping Use   Vaping Use: Never used  Substance and Sexual Activity   Alcohol use: Yes    Alcohol/week: 2.0 standard drinks    Types: 2 Glasses of wine per week    Comment: occ   Drug use: No   Sexual activity: Not Currently    Comment: Quit smoking after MI- prev 1ppd x 35ys  Other Topics Concern   Not on file  Social History Narrative   Not on file   Social Determinants of Health   Financial Resource Strain: Not on file  Food Insecurity: Not on file  Transportation Needs: Not on file  Physical Activity: Not on file  Stress: Not on file  Social Connections: Not on file  Intimate Partner Violence: Not on file    Outpatient Medications Prior to Visit  Medication Sig Dispense Refill   allopurinol (ZYLOPRIM) 300 MG tablet Take 300 mg by mouth daily.     aspirin EC 81 MG tablet Take 1 tablet (81 mg total) by mouth 2 (two) times daily after a meal. Swallow whole. 45 tablet 0   carvedilol (COREG) 6.25 MG tablet Take 1 tablet (6.25 mg total) by mouth 2 (two) times daily with a meal. 180 tablet 1   Cholecalciferol (VITAMIN D) 50 MCG (2000 UT) tablet Take 1 tablet (2,000 Units total) by mouth daily.     clopidogrel (PLAVIX) 75 MG tablet Take 75 mg by mouth daily.     colchicine 0.6 MG tablet Take 1.2 mg by mouth daily as needed (Gout flare ups).     furosemide (LASIX) 40 MG tablet Take 40 mg by mouth daily.     levothyroxine (SYNTHROID, LEVOTHROID) 125 MCG tablet Take 125 mcg by mouth daily before breakfast.     LINZESS 145 MCG CAPS capsule Take 1 capsule (145 mcg total) by mouth daily. 30 capsule 11   losartan (COZAAR)  50 MG tablet Take 1 tablet (50 mg total) by mouth daily. 30 tablet 0   nitroGLYCERIN (NITROSTAT) 0.4 MG SL tablet Place 0.4 mg under  the tongue every 5 (five) minutes as needed for chest pain.     potassium chloride (KLOR-CON) 10 MEQ tablet Take 1 tablet (10 mEq total) by mouth daily as needed. Taking 1 tablet when taking Lasix (Patient taking differently: Take 10 mEq by mouth daily.) 30 tablet 0   rosuvastatin (CRESTOR) 10 MG tablet One tab po every other night b-4 bed (Patient taking differently: Take 10 mg by mouth every other day. Every other day at bedtime)     Selenium 200 MCG CAPS Take 200 mcg by mouth daily.     Semaglutide, 1 MG/DOSE, 4 MG/3ML SOPN Inject 1 mg into the skin every 7 (seven) days. 1 mg wkly 3 mL 3   Vitamin D, Ergocalciferol, (DRISDOL) 1.25 MG (50000 UNIT) CAPS capsule 1 PO Q 5 DAYS (Patient taking differently: Take 50,000 Units by mouth every 7 (seven) days.) 6 capsule 0   zinc gluconate 50 MG tablet Take 50 mg by mouth daily.     HYDROcodone-acetaminophen (NORCO/VICODIN) 5-325 MG tablet Take 1-2 tablets by mouth every 6 (six) hours as needed for moderate pain or severe pain (post op pain). 40 tablet 0   tiZANidine (ZANAFLEX) 4 MG tablet Take 1 tablet (4 mg total) by mouth every 6 (six) hours as needed for muscle spasms. 40 tablet 1   No facility-administered medications prior to visit.    Allergies  Allergen Reactions   Amlodipine Swelling   Lisinopril Cough   Statins Other (See Comments)    Myalgia    Review of Systems  Constitutional:  Negative for chills, fatigue and fever.  Respiratory:  Negative for cough and shortness of breath.   Cardiovascular:  Negative for chest pain and leg swelling.  Gastrointestinal:  Positive for abdominal pain. Negative for diarrhea, nausea and vomiting.       BM yesterday that was her normal   Genitourinary:  Positive for vaginal discharge. Negative for dysuria, flank pain, frequency and urgency.       Urine odor +   Neurological:  Negative for numbness and headaches.      Objective:    Physical Exam Vitals and nursing note reviewed.  Constitutional:      Appearance: She is obese.     Comments: Walks with a cane  Cardiovascular:     Rate and Rhythm: Normal rate and regular rhythm.     Pulses:          Dorsalis pedis pulses are 1+ on the right side and 1+ on the left side.     Heart sounds: Normal heart sounds.  Pulmonary:     Effort: Pulmonary effort is normal.     Breath sounds: Normal breath sounds.  Abdominal:     General: Bowel sounds are normal.     Palpations: Abdomen is soft.     Tenderness: There is abdominal tenderness in the right upper quadrant. There is no right CVA tenderness or left CVA tenderness.     Hernia: No hernia is present.  Musculoskeletal:     Lumbar back: No tenderness or bony tenderness. Negative right straight leg raise test and negative left straight leg raise test.     Left hip: Normal.     Right lower leg: Normal. No tenderness. No edema.     Left lower leg: Normal. No tenderness. No edema.     Comments: Bilateral lower extremity strength 5/5 Both calves soft and supple  Skin:    General: Skin is warm.  Neurological:  Mental Status: She is alert.    BP (!) 138/96    Pulse 60    Temp 97.6 F (36.4 C)    Resp 14    Ht '5\' 5"'  (1.651 m)    Wt 233 lb (105.7 kg)    SpO2 98%    BMI 38.77 kg/m  Wt Readings from Last 3 Encounters:  03/21/21 233 lb (105.7 kg)  03/19/21 235 lb (106.6 kg)  01/22/21 242 lb 3.9 oz (109.9 kg)    Health Maintenance Due  Topic Date Due   Hepatitis C Screening  Never done   Zoster Vaccines- Shingrix (1 of 2) Never done   Pneumonia Vaccine 24+ Years old (2 - PPSV23 if available, else PCV20) 01/23/2017   COVID-19 Vaccine (3 - Pfizer risk series) 02/20/2020    There are no preventive care reminders to display for this patient.   Lab Results  Component Value Date   TSH 1.080 11/21/2020   Lab Results  Component Value Date    WBC 7.3 01/16/2021   HGB 14.0 01/16/2021   HCT 42.8 01/16/2021   MCV 97.5 01/16/2021   PLT 219 01/16/2021   Lab Results  Component Value Date   NA 137 01/16/2021   K 3.8 01/16/2021   CO2 28 01/16/2021   GLUCOSE 87 01/16/2021   BUN 27 (H) 01/16/2021   CREATININE 0.89 01/16/2021   BILITOT 0.3 11/21/2020   ALKPHOS 125 (H) 11/21/2020   AST 19 11/21/2020   ALT 13 11/21/2020   PROT 7.0 11/21/2020   ALBUMIN 4.1 11/21/2020   CALCIUM 9.2 01/16/2021   ANIONGAP 3 (L) 01/16/2021   EGFR 69 11/21/2020   GFR 73.32 03/02/2014   Lab Results  Component Value Date   CHOL 173 05/03/2020   Lab Results  Component Value Date   HDL 58 05/03/2020   Lab Results  Component Value Date   LDLCALC 98 05/03/2020   Lab Results  Component Value Date   TRIG 96 05/03/2020   Lab Results  Component Value Date   CHOLHDL 3.0 05/03/2020   Lab Results  Component Value Date   HGBA1C 5.4 01/16/2021       Assessment & Plan:   Problem List Items Addressed This Visit       Other   Vaginal discharge    She did mention that she is having some vaginal discharge we will allow her to self swab wet prep pending results.      Relevant Orders   WET PREP BY MOLECULAR PROBE   Abnormal urine finding    Patient's UA showed 3+ leukocytes and some protein.  Patient recently had surgery and showed the same since the protein.  She not having any symptoms we will send him for microscopy see if his contaminated if it does not look contaminated we will start treatment.      Relevant Orders   Urine Microscopic   Right upper quadrant pain - Primary    Patient is having right upper quadrant pain after physical exam and questioning.  Patient states she does still have her gallbladder.  Pending lab results and ultrasound of abdomen      Relevant Orders   POCT urinalysis dipstick (Completed)   Urine Culture   CBC with Differential/Platelet   Comprehensive metabolic panel   Lipase   US Abdomen Complete      No orders of the defined types were placed in this encounter.  This visit occurred during the SARS-CoV-2 public health emergency.  Safety  protocols were in place, including screening questions prior to the visit, additional usage of staff PPE, and extensive cleaning of exam room while observing appropriate contact time as indicated for disinfecting solutions.    Romilda Garret, NP

## 2021-03-21 NOTE — Assessment & Plan Note (Signed)
She did mention that she is having some vaginal discharge we will allow her to self swab wet prep pending results.

## 2021-03-21 NOTE — Assessment & Plan Note (Signed)
Patient's UA showed 3+ leukocytes and some protein.  Patient recently had surgery and showed the same since the protein.  She not having any symptoms we will send him for microscopy see if his contaminated if it does not look contaminated we will start treatment.

## 2021-03-21 NOTE — Patient Instructions (Signed)
Nice to see you today Will be in touch with your lab results. Also will be in touch about your urine results. I will place an order for an Ultrasound of you abdomen. They will call you to set this up. This will be done in [place]. Let me know if anything changes or symptoms increase. For now try to avoid alcohol and tylenol.

## 2021-03-22 LAB — WET PREP BY MOLECULAR PROBE
Candida species: NOT DETECTED
MICRO NUMBER:: 12831994
SPECIMEN QUALITY:: ADEQUATE
Trichomonas vaginosis: NOT DETECTED

## 2021-03-22 LAB — URINE CULTURE
MICRO NUMBER:: 12831995
SPECIMEN QUALITY:: ADEQUATE

## 2021-03-25 ENCOUNTER — Ambulatory Visit: Payer: Medicare Other

## 2021-03-25 ENCOUNTER — Telehealth: Payer: Self-pay

## 2021-03-25 DIAGNOSIS — Z13820 Encounter for screening for osteoporosis: Secondary | ICD-10-CM

## 2021-03-25 DIAGNOSIS — Z780 Asymptomatic menopausal state: Secondary | ICD-10-CM

## 2021-03-25 NOTE — Telephone Encounter (Signed)
Patient has an appointment for mammogram on 03/27/21 and patient wanted to get her Bone Density done at the same time but she can not be added on for that until order is put in. Please review.

## 2021-03-26 ENCOUNTER — Other Ambulatory Visit: Payer: Commercial Managed Care - HMO

## 2021-03-26 NOTE — Telephone Encounter (Signed)
Patient advised.

## 2021-03-27 ENCOUNTER — Ambulatory Visit
Admission: RE | Admit: 2021-03-27 | Discharge: 2021-03-27 | Disposition: A | Discharge status: Home / Self Care | Payer: Commercial Managed Care - HMO | Source: Ambulatory Visit | Attending: Family Medicine | Admitting: Family Medicine

## 2021-03-27 ENCOUNTER — Ambulatory Visit
Admission: RE | Admit: 2021-03-27 | Discharge: 2021-03-27 | Disposition: A | Discharge status: Home / Self Care | Payer: Commercial Managed Care - HMO | Source: Ambulatory Visit | Attending: Nurse Practitioner | Admitting: Nurse Practitioner

## 2021-03-27 ENCOUNTER — Other Ambulatory Visit: Payer: Self-pay

## 2021-03-27 DIAGNOSIS — Z1231 Encounter for screening mammogram for malignant neoplasm of breast: Secondary | ICD-10-CM

## 2021-03-27 DIAGNOSIS — K760 Fatty (change of) liver, not elsewhere classified: Secondary | ICD-10-CM | POA: Diagnosis not present

## 2021-03-27 DIAGNOSIS — N281 Cyst of kidney, acquired: Secondary | ICD-10-CM | POA: Diagnosis not present

## 2021-03-27 DIAGNOSIS — R1011 Right upper quadrant pain: Secondary | ICD-10-CM

## 2021-04-01 ENCOUNTER — Other Ambulatory Visit: Payer: Self-pay

## 2021-04-02 ENCOUNTER — Ambulatory Visit (INDEPENDENT_AMBULATORY_CARE_PROVIDER_SITE_OTHER): Payer: Commercial Managed Care - HMO | Admitting: Family Medicine

## 2021-04-02 VITALS — BP 124/80 | HR 70 | Temp 97.0°F | Ht 65.0 in | Wt 235.8 lb

## 2021-04-02 DIAGNOSIS — B9689 Other specified bacterial agents as the cause of diseases classified elsewhere: Secondary | ICD-10-CM | POA: Diagnosis not present

## 2021-04-02 DIAGNOSIS — R071 Chest pain on breathing: Secondary | ICD-10-CM | POA: Diagnosis not present

## 2021-04-02 DIAGNOSIS — N281 Cyst of kidney, acquired: Secondary | ICD-10-CM | POA: Insufficient documentation

## 2021-04-02 DIAGNOSIS — R1011 Right upper quadrant pain: Secondary | ICD-10-CM | POA: Diagnosis not present

## 2021-04-02 DIAGNOSIS — N760 Acute vaginitis: Secondary | ICD-10-CM | POA: Diagnosis not present

## 2021-04-02 MED ORDER — METRONIDAZOLE 500 MG PO TABS: 500.0000 mg | ORAL_TABLET | Freq: Three times a day (TID) | ORAL | 0 refills | Status: AC

## 2021-04-02 NOTE — Progress Notes (Signed)
[facility] [address] [name] [address] Dept: [phone] Dept Fax: [phone]  Office Visit  Subjective:    Patient ID: Caitlyn [name], female    DOB: 04/09/1950, 71 y.o..   MRN: [id number]  Chief Complaint  Patient presents with   Acute Visit    C/o still having upper RT abdomen pain after taking antibiotics x 1 week.  She has been taking Tylenol with little relief.       History of Present Illness:  Patient is in today for reassessment of RUQ abdominal pain. Caitlyn [name] was initially seen by Caitlyn Ito, NP on 03/21/2021 with pain having started around 12/28. The pain was described as sharp, stabbing with movement and a dull ache at rest. She denies any fever, nausea/vomiting, diarrhea or dysuria. The pain is worse with movement and with deep breathing or cough. Caitlyn [name] has not had previous abdominal surgery, but did have a total left hip arthroplasty in early Nov. She has a history of chronic constipation as well. Caitlyn [name] ordered lab studies and an ultrasound. He treated Caitlyn [name] empirically for a UTI with Septra. Caitlyn [name] has been out of work and is anxious to have this issue resolve so she can return.  Past Medical History: Patient Active Problem List   Diagnosis Date Noted   Vaginal discharge 03/21/2021   Abnormal urine finding 03/21/2021   Right upper quadrant pain 03/21/2021   Primary osteoarthritis of left hip 01/22/2021   History of coronary angioplasty with insertion of stent 01/09/2021   COPD mixed type ([place])    Chronic constipation 07/12/2020   Essential hypertension 05/21/2020   Seasonal and perennial allergic rhinitis 02/17/2020   Obstructive sleep apnea 10/14/2019   Insomnia 10/14/2019   Hx of Graves' disease 04/19/2019   Chronic midline low back pain without sciatica 04/02/2019   History of DVT (deep vein thrombosis) 03/30/2019   COVID-19 virus infection 09/08/2018   Class 3 severe  obesity with serious comorbidity and body mass index (BMI) of 40.0 to 44.9 in adult [facility]) 02/23/2018   Prediabetes 01/13/2018   Upper airway cough syndrome 05/29/2016   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   Acute coronary syndrome ([place]) 01/06/2016   Edema 03/16/2013   Gout 03/16/2013   Arthralgia of ankle 03/16/2013   Depression    Leg pain 07/31/2011   Vitamin D deficiency 03/15/2010   Carotid stenosis 07/24/2009   Hyperlipidemia 07/23/2009   CAD (coronary artery disease) 07/23/2009   Peripheral vascular disease ([place]) 07/23/2009   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   Past Surgical History:  Procedure Laterality Date   CARDIAC CATHETERIZATION N/A 01/07/2016   Procedure: Left Heart Cath and Coronary Angiography;  Surgeon: Charolette Forward, MD;  Location: [place] [facility];  Service: Cardiovascular;  Laterality: N/A;   CARDIAC CATHETERIZATION N/A 01/07/2016   Procedure: Coronary Stent Intervention;  Surgeon: Charolette Forward, MD;  Location: [place] [facility];  Service: Cardiovascular;  Laterality: N/A;   CAROTID STENT Left    bilateral carotid artery disease post stent of left carotid   CESAREAN SECTION  1985   "twins"   CORONARY ANGIOPLASTY     LEFT HEART CATHETERIZATION WITH CORONARY ANGIOGRAM N/A 03/17/2011   Procedure: LEFT HEART CATHETERIZATION WITH CORONARY ANGIOGRAM;  Surgeon: Hillary Bow, MD;  Location: [facility] CATH LAB;  Service: Cardiovascular;  Laterality: N/A;   PERIPHERAL VASCULAR CATHETERIZATION N/A 04/06/2015   Procedure: Abdominal Aortogram;  Surgeon: Elam Dutch, MD;  Location: [place] [facility]  [facility];  Service: Cardiovascular;  Laterality: N/A;   RADIOACTIVE PLAQUE INSERTION     Graves disease post radioactive treatment complicated hypothyroidism   TOTAL HIP ARTHROPLASTY Left 01/22/2021   Procedure: LEFT TOTAL HIP ARTHROPLASTY ANTERIOR APPROACH;  Surgeon: Melrose Nakayama, MD;  Location: [facility];  Service: Orthopedics;  Laterality: Left;   Family History  Problem  Relation Age of Onset   Heart attack Mother    Stroke Mother    Cancer Mother    Varicose Veins Mother    Thyroid disease Mother    Hypertension Mother    Heart disease Mother    Sudden death Mother    Depression Mother    Cancer Father    Heart disease Father    Hypertension Father    Coronary artery disease Other    Hypertension Other    Hypertension Sister    Breast cancer Paternal Aunt    Outpatient Medications Prior to Visit  Medication Sig Dispense Refill   allopurinol (ZYLOPRIM) 300 MG tablet Take 300 mg by mouth daily.     aspirin EC 81 MG tablet Take 1 tablet (81 mg total) by mouth 2 (two) times daily after a meal. Swallow whole. 45 tablet 0   carvedilol (COREG) 6.25 MG tablet Take 1 tablet (6.25 mg total) by mouth 2 (two) times daily with a meal. 180 tablet 1   Cholecalciferol (VITAMIN D) 50 MCG (2000 UT) tablet Take 1 tablet (2,000 Units total) by mouth daily.     clopidogrel (PLAVIX) 75 MG tablet Take 75 mg by mouth daily.     colchicine 0.6 MG tablet Take 1.2 mg by mouth daily as needed (Gout flare ups).     furosemide (LASIX) 40 MG tablet Take 40 mg by mouth daily.     levothyroxine (SYNTHROID, LEVOTHROID) 125 MCG tablet Take 125 mcg by mouth daily before breakfast.     LINZESS 145 MCG CAPS capsule Take 1 capsule (145 mcg total) by mouth daily. 30 capsule 11   losartan (COZAAR) 50 MG tablet Take 1 tablet (50 mg total) by mouth daily. 30 tablet 0   nitroGLYCERIN (NITROSTAT) 0.4 MG SL tablet Place 0.4 mg under the tongue every 5 (five) minutes as needed for chest pain.     potassium chloride (KLOR-CON) 10 MEQ tablet Take 1 tablet (10 mEq total) by mouth daily as needed. Taking 1 tablet when taking Lasix (Patient taking differently: Take 10 mEq by mouth daily.) 30 tablet 0   rosuvastatin (CRESTOR) 10 MG tablet One tab po every other night b-4 bed (Patient taking differently: Take 10 mg by mouth every other day. Every other day at bedtime)     Semaglutide, 1 MG/DOSE, 4  MG/3ML SOPN Inject 1 mg into the skin every 7 (seven) days. 1 mg wkly 3 mL 3   Vitamin D, Ergocalciferol, (DRISDOL) 1.25 MG (50000 UNIT) CAPS capsule 1 PO Q 5 DAYS (Patient taking differently: Take 50,000 Units by mouth every 7 (seven) days.) 6 capsule 0   zinc gluconate 50 MG tablet Take 50 mg by mouth daily.     Selenium 200 MCG CAPS Take 200 mcg by mouth daily. (Patient not taking: Reported on 04/02/2021)     No facility-administered medications prior to visit.   Allergies  Allergen Reactions   Amlodipine Swelling   Lisinopril Cough   Statins Other (See Comments)    Myalgia     Objective:   Today's Vitals   04/02/21 1553  BP: 124/80  Pulse: 70  Temp: Marland Kitchen)  97 F (36.1 C)  TempSrc: Temporal  SpO2: 97%  Weight: 235 lb 12.8 oz (107 kg)  Height: 5\' 5"  (1.651 m)   Body mass index is 39.24 kg/m.   General: Well developed, well nourished. No acute distress. Lungs: Clear to auscultation bilaterally. No wheezing, rales or rhonchi. CV: RRR without murmurs or rubs. Pulses 2+ bilaterally. Abdomen: Soft. Bowel sounds positive, normal pitch and frequency. No hepatosplenomegaly.   There is moderate tenderness to palpation over the right upper abdomen flank/overlapping with   the lower rib cage. No rebound or guarding. The pain is decreased with lifting her head off the   exam table. Psych: Alert and oriented. Normal mood and affect.  Health Maintenance Due  Topic Date Due   Hepatitis C Screening  Never done   Zoster Vaccines- Shingrix (1 of 2) Never done   Pneumonia Vaccine 76+ Years old (2 - PPSV23 if available, else PCV20) 01/23/2017   COVID-19 Vaccine (3 - Pfizer risk series) 02/20/2020   Lab Results Last CBC Lab Results  Component Value Date   WBC 5.4 03/21/2021   HGB 13.7 03/21/2021   HCT 42.0 03/21/2021   MCV 93.9 03/21/2021   MCH 31.9 01/16/2021   RDW 14.5 03/21/2021   PLT 214.0 99991111   Last metabolic panel Lab Results  Component Value Date   GLUCOSE 76  03/21/2021   NA 138 03/21/2021   K 4.0 03/21/2021   CL 104 03/21/2021   CO2 29 03/21/2021   BUN 21 03/21/2021   CREATININE 0.85 03/21/2021   GFRNONAA >60 01/16/2021   CALCIUM 9.8 03/21/2021   PROT 7.7 03/21/2021   ALBUMIN 4.0 03/21/2021   LABGLOB 2.9 11/21/2020   AGRATIO 1.4 11/21/2020   BILITOT 0.4 03/21/2021   ALKPHOS 107 03/21/2021   AST 15 03/21/2021   ALT 9 03/21/2021   ANIONGAP 3 (L) 01/16/2021   Component Ref Range & Units 12 d ago  Lipase 11.0 - 59.0 U/L 34.0    Urine microscopic Component Ref Range & Units 12 d ago 2 mo ago 8 yr ago 10 yr ago  WBC, UA 0-2/hpf 21-50/hpf Abnormal   21-50 R  3-6/hpf Abnormal   21-50 R   RBC / HPF 0-2/hpf 0-2/hpf  6-10 R  3-6/hpf Abnormal   0-2 R   Squamous Epithelial / LPF Rare(0-4/hpf) Rare(0-4/hpf)  0-5 R, CM  Rare(0-4/hpf)  RARE R   Bacteria, UA None Rare(<10/hpf) Abnormal   MANY Abnormal  R     Amorphous None;Present Present Abnormal         Urine Culture Component 12 d ago  MICRO NUMBER: [id number]   SPECIMEN QUALITY: Adequate   Sample Source NOT GIVEN   STATUS: FINAL   Result: Mixed genital flora isolated. These superficial bacteria are not indicative of a urinary tract infection. No further organism identification is warranted on this specimen. If clinically indicated, recollect clean-catch, mid-stream urine and transfer  immediately to Urine Culture Transport Tube.    Vaginal Wet Prep Component 12 d ago  MICRO NUMBER: [id number]   SPECIMEN QUALITY: Adequate   SOURCE: NOT GIVEN   STATUS: FINAL   Trichomonas vaginosis Not Detected   Gardnerella vaginalis  Abnormal  Detected. Increased levels of G. vaginalis may not be significant in the absence of signs and symptoms of bacterial vaginosis.  Candida species Not Detected    Imaging: Ultrasound Abdomen (03/27/2021) IMPRESSION: Mild hepatic steatosis.   7.2 cm right upper polar exophytic simple, benign renal cortical cyst Assessment & Plan:  1. Right upper quadrant  abdominal pain Ms. Widing has persistent RUQ/Right lower chest pain. She has a fairly large right renal cyst present (7.2 cm) and possible fatty liver. I still don't have a good explanation for her pain. Her exam is consistent with an intraabdominal source rather than muscle or chest wall. As the pain is worse with breathing, I do think it reasonable to get a chest and abdomen CT to evaluate further.  - CT Chest W Contrast; Future - CT Abdomen Pelvis Wo Contrast; Future  2. Chest pain on breathing As above.  - CT Chest W Contrast; Future  3. Bacterial vaginosis Ms. Liska had evidence for BV. I am not surprised that the Septra did not resolve this. I will treat her with a course of Flagyl.  - metroNIDAZOLE (FLAGYL) 500 MG tablet; Take 1 tablet (500 mg total) by mouth 3 (three) times daily for 7 days.  Dispense: 21 tablet; Refill: 0  Haydee Salter, MD

## 2021-04-04 ENCOUNTER — Telehealth: Payer: Self-pay | Admitting: Family Medicine

## 2021-04-04 NOTE — Telephone Encounter (Signed)
Pt would like a call back about her referral for CT scan.

## 2021-04-04 NOTE — Telephone Encounter (Signed)
Patient notified VIA phone that imaging will call her as soon as we get them the information.  Dm/cma

## 2021-04-04 NOTE — Telephone Encounter (Signed)
Can you check on this referral?  She said she hasn't been contacted yet.   Thanks. Dm/cma

## 2021-04-07 ENCOUNTER — Other Ambulatory Visit (INDEPENDENT_AMBULATORY_CARE_PROVIDER_SITE_OTHER): Payer: Self-pay | Admitting: Family Medicine

## 2021-04-07 DIAGNOSIS — R7303 Prediabetes: Secondary | ICD-10-CM

## 2021-04-08 ENCOUNTER — Other Ambulatory Visit: Payer: Self-pay

## 2021-04-08 ENCOUNTER — Encounter: Payer: Self-pay | Admitting: Family Medicine

## 2021-04-08 ENCOUNTER — Ambulatory Visit
Admission: RE | Admit: 2021-04-08 | Discharge: 2021-04-08 | Disposition: A | Discharge status: Home / Self Care | Payer: Medicare Other | Source: Ambulatory Visit | Attending: Family Medicine | Admitting: Family Medicine

## 2021-04-08 DIAGNOSIS — Z780 Asymptomatic menopausal state: Secondary | ICD-10-CM

## 2021-04-08 DIAGNOSIS — M8588 Other specified disorders of bone density and structure, other site: Secondary | ICD-10-CM | POA: Diagnosis not present

## 2021-04-08 DIAGNOSIS — E876 Hypokalemia: Secondary | ICD-10-CM

## 2021-04-08 DIAGNOSIS — Z13820 Encounter for screening for osteoporosis: Secondary | ICD-10-CM

## 2021-04-08 DIAGNOSIS — R7303 Prediabetes: Secondary | ICD-10-CM

## 2021-04-08 MED ORDER — SEMAGLUTIDE (1 MG/DOSE) 4 MG/3ML ~~LOC~~ SOPN: 1.0000 mg | PEN_INJECTOR | SUBCUTANEOUS | 3 refills | Status: DC

## 2021-04-08 MED ORDER — POTASSIUM CHLORIDE ER 10 MEQ PO TBCR: 10.0000 meq | EXTENDED_RELEASE_TABLET | Freq: Every day | ORAL | 0 refills | Status: AC | PRN

## 2021-04-08 NOTE — Telephone Encounter (Signed)
Refill request for: [place] Cl ER 10 meq LR 06/27/20, #30, 0 rf (Angel Brown)  Ozempic 1 mg LR 02/13/21, 3 ml, 3 rf LOV 04/02/21 FOV  none scheduled.    Please review and advise.  Thanks. Dm/cma

## 2021-04-11 ENCOUNTER — Ambulatory Visit
Admission: RE | Admit: 2021-04-11 | Discharge: 2021-04-11 | Disposition: A | Discharge status: Home / Self Care | Payer: Commercial Managed Care - HMO | Source: Ambulatory Visit | Attending: Family Medicine | Admitting: Family Medicine

## 2021-04-11 DIAGNOSIS — J439 Emphysema, unspecified: Secondary | ICD-10-CM | POA: Diagnosis not present

## 2021-04-11 DIAGNOSIS — R1011 Right upper quadrant pain: Secondary | ICD-10-CM

## 2021-04-11 DIAGNOSIS — R071 Chest pain on breathing: Secondary | ICD-10-CM

## 2021-04-11 DIAGNOSIS — I2510 Atherosclerotic heart disease of native coronary artery without angina pectoris: Secondary | ICD-10-CM | POA: Diagnosis not present

## 2021-04-11 DIAGNOSIS — N281 Cyst of kidney, acquired: Secondary | ICD-10-CM | POA: Diagnosis not present

## 2021-04-11 DIAGNOSIS — K5730 Diverticulosis of large intestine without perforation or abscess without bleeding: Secondary | ICD-10-CM | POA: Diagnosis not present

## 2021-04-11 DIAGNOSIS — R109 Unspecified abdominal pain: Secondary | ICD-10-CM | POA: Diagnosis not present

## 2021-04-11 DIAGNOSIS — I700 Atherosclerosis of aorta: Secondary | ICD-10-CM | POA: Diagnosis not present

## 2021-04-11 MED ORDER — IOPAMIDOL (ISOVUE-300) INJECTION 61%
75.0000 mL | Freq: Once | INTRAVENOUS | Status: AC | PRN
  Administered 2021-04-11: 75 mL via INTRAVENOUS

## 2021-04-12 ENCOUNTER — Other Ambulatory Visit (INDEPENDENT_AMBULATORY_CARE_PROVIDER_SITE_OTHER): Payer: Self-pay | Admitting: Family Medicine

## 2021-04-12 ENCOUNTER — Telehealth: Payer: Self-pay | Admitting: Family Medicine

## 2021-04-12 DIAGNOSIS — K5909 Other constipation: Secondary | ICD-10-CM

## 2021-04-12 NOTE — Telephone Encounter (Signed)
Advised patient that report isn't in the chart yet and once we get it we will get in touch with her.   Dm/cma

## 2021-04-12 NOTE — Telephone Encounter (Signed)
Pt called regarding blood work.

## 2021-04-12 NOTE — Telephone Encounter (Signed)
Spoke to patient and advised that the CT results haven't been reviewed yet or the Dexa scan.  Advised that will send provider a note and will call her back later.   Please review.   Thanks. Dm/cma

## 2021-04-15 ENCOUNTER — Encounter: Payer: Self-pay | Admitting: Family Medicine

## 2021-04-15 DIAGNOSIS — K5790 Diverticulosis of intestine, part unspecified, without perforation or abscess without bleeding: Secondary | ICD-10-CM | POA: Insufficient documentation

## 2021-04-15 NOTE — Telephone Encounter (Signed)
Dr.Opalski ?

## 2021-04-15 NOTE — Telephone Encounter (Signed)
Patient notified VIA phone and will f/u in office it the pain persists.  Dm/cma

## 2021-04-23 NOTE — Progress Notes (Signed)
Chief Complaint:  Follow up CAD  History of Present Illness: 71 yo female with history of CAD, hyperlipidemia, hypothyroidism, sleep apnea on CPAP, Graves disease s/p thyroid ablation, , mild PAD, mild COPD, chronic diastolic CHF, carotid artery disease and HTN here for cardiac follow up. She had an MI in 06/2009 and had 2 DES placed in the RCA. She had chest pains leading to a stress test and cardiac cath in December 2012. Cardiac cath December 2012 with stable CAD. She had been followed in our practice by Dr. Juanda Chance and then by me. I saw her last in 2015. She was then followed by Dr. Sharyn Lull. Cardiac cath in 2017 with severe RCA stenosis treated with a drug eluting stent. Mild disease in the LAD and Circumflex. She was seen by Dr. Kirke Corin in October 2022 for pre-operative assessment and reported dyspnea but no chest pain. Nuclear stress test November 2022 with no evidence of ischemia. She had her left hip replaced in November 2022.   She is here today for follow up. The patient denies any chest pain, dyspnea, palpitations, lower extremity edema, orthopnea, PND, dizziness, near syncope or syncope. BP has been elevated at home.   Primary Care Physician: Loyola Mast, MD  Past Medical History:  Diagnosis Date   Anxiety    Arthritis    "legs" (01/07/2016)   Back pain    Back pain    CAD 06/2009   a.  s/p MI 4/11: tx with DES x 2 to RCA;  b.  LHC 03/17/11: LAD 30-40%, distal LAD 30-40%, OM2 40-50%, RCA stent patent, EF greater than 70%.;  c.  Lexiscan Myoview (12/15):  Mild apical thinning, no ischemia, EF 61%; normal study   CAROTID STENOSIS    carotid Dopplers 03/25/11: RICA 60-79%; LICA 0-39%.  Follow up recommended in 6 months.   Constipation    Constipation    Depression    Gout    "on RX for it" (01/07/2016)   Heart disease    History of heart attack    Hx of blood clots    HYPERLIPIDEMIA    HYPERTENSION    HYPOTHYROIDISM    post ablation tx Graves   Hypothyroidism    Joint  pain    Myocardial infarction [facility]) 2012   "mini"   OSA on CPAP    Osteoarthritis    Pre-diabetes    PVD    Rheumatoid arthritis (HCC)    Sleep apnea    SOBOE (shortness of breath on exertion)    TOBACCO ABUSE quit 06/2009   Varicose veins    VITAMIN D DEFICIENCY    DEXA 04/2009 normal    Past Surgical History:  Procedure Laterality Date   CARDIAC CATHETERIZATION N/A 01/07/2016   Procedure: Left Heart Cath and Coronary Angiography;  Surgeon: Rinaldo Cloud, MD;  Location: [facility] INVASIVE CV LAB;  Service: Cardiovascular;  Laterality: N/A;   CARDIAC CATHETERIZATION N/A 01/07/2016   Procedure: Coronary Stent Intervention;  Surgeon: Rinaldo Cloud, MD;  Location: [facility] INVASIVE CV LAB;  Service: Cardiovascular;  Laterality: N/A;   CAROTID STENT Left    bilateral carotid artery disease post stent of left carotid   CESAREAN SECTION  1985   "twins"   CORONARY ANGIOPLASTY     LEFT HEART CATHETERIZATION WITH CORONARY ANGIOGRAM N/A 03/17/2011   Procedure: LEFT HEART CATHETERIZATION WITH CORONARY ANGIOGRAM;  Surgeon: Herby Abraham, MD;  Location: [facility] CATH LAB;  Service: Cardiovascular;  Laterality: N/A;   PERIPHERAL VASCULAR CATHETERIZATION N/A  04/06/2015   Procedure: Abdominal Aortogram;  Surgeon: Sherren Kerns, MD;  Location: [facility] INVASIVE CV LAB;  Service: Cardiovascular;  Laterality: N/A;   RADIOACTIVE PLAQUE INSERTION     Graves disease post radioactive treatment complicated hypothyroidism   TOTAL HIP ARTHROPLASTY Left 01/22/2021   Procedure: LEFT TOTAL HIP ARTHROPLASTY ANTERIOR APPROACH;  Surgeon: Marcene Corning, MD;  Location: [facility] ORS;  Service: Orthopedics;  Laterality: Left;    Current Outpatient Medications  Medication Sig Dispense Refill   allopurinol (ZYLOPRIM) 300 MG tablet Take 300 mg by mouth daily.     aspirin EC 81 MG tablet Take 81 mg by mouth daily. Swallow whole.     carvedilol (COREG) 6.25 MG tablet Take 1 tablet (6.25 mg total) by mouth 2 (two) times daily with a meal. 180  tablet 1   Cholecalciferol (VITAMIN D) 50 MCG (2000 UT) tablet Take 1 tablet (2,000 Units total) by mouth daily.     clopidogrel (PLAVIX) 75 MG tablet Take 75 mg by mouth daily.     colchicine 0.6 MG tablet Take 1.2 mg by mouth daily as needed (Gout flare ups).     furosemide (LASIX) 40 MG tablet Take 40 mg by mouth daily.     levothyroxine (SYNTHROID, LEVOTHROID) 125 MCG tablet Take 125 mcg by mouth daily before breakfast.     LINZESS 145 MCG CAPS capsule Take 1 capsule (145 mcg total) by mouth daily. 30 capsule 11   losartan (COZAAR) 100 MG tablet Take 1 tablet (100 mg total) by mouth daily. 90 tablet 3   potassium chloride (KLOR-CON) 10 MEQ tablet Take 1 tablet (10 mEq total) by mouth daily as needed. Taking 1 tablet when taking Lasix 30 tablet 0   rosuvastatin (CRESTOR) 10 MG tablet One tab po every other night b-4 bed (Patient taking differently: Take 10 mg by mouth every other day. Every other day at bedtime)     Selenium 200 MCG CAPS Take 200 mcg by mouth daily.     Semaglutide, 1 MG/DOSE, 4 MG/3ML SOPN Inject 1 mg into the skin every 7 (seven) days. 1 mg wkly 3 mL 3   Vitamin D, Ergocalciferol, (DRISDOL) 1.25 MG (50000 UNIT) CAPS capsule 1 PO Q 5 DAYS (Patient taking differently: Take 50,000 Units by mouth every 7 (seven) days.) 6 capsule 0   zinc gluconate 50 MG tablet Take 50 mg by mouth daily.     nitroGLYCERIN (NITROSTAT) 0.4 MG SL tablet Place 1 tablet (0.4 mg total) under the tongue every 5 (five) minutes as needed for chest pain. 25 tablet 3   No current facility-administered medications for this visit.    Allergies  Allergen Reactions   Amlodipine Swelling   Lisinopril Cough   Statins Other (See Comments)    Myalgia    Social History   Socioeconomic History   Marital status: Divorced    Spouse name: Not on file   Number of children: Not on file   Years of education: Not on file   Highest education level: Not on file  Occupational History   Occupation: retired,  Comptroller for mental health  Tobacco Use   Smoking status: Former    Packs/day: 1.00    Years: 35.00    Pack years: 35.00    Types: Cigarettes    Quit date: 06/30/2009    Years since quitting: 11.8   Smokeless tobacco: Never  Vaping Use   Vaping Use: Never used  Substance and Sexual Activity   Alcohol use: Yes  Alcohol/week: 2.0 standard drinks    Types: 2 Glasses of wine per week    Comment: occ   Drug use: No   Sexual activity: Not Currently    Comment: Quit smoking after MI- prev 1ppd x 35ys  Other Topics Concern   Not on file  Social History Narrative   Not on file   Social Determinants of Health   Financial Resource Strain: Not on file  Food Insecurity: Not on file  Transportation Needs: Not on file  Physical Activity: Not on file  Stress: Not on file  Social Connections: Not on file  Intimate Partner Violence: Not on file    Family History  Problem Relation Age of Onset   Heart attack Mother    Stroke Mother    Cancer Mother    Varicose Veins Mother    Thyroid disease Mother    Hypertension Mother    Heart disease Mother    Sudden death Mother    Depression Mother    Cancer Father    Heart disease Father    Hypertension Father    Coronary artery disease Other    Hypertension Other    Hypertension Sister    Breast cancer Paternal Aunt     Review of Systems:  As stated in the HPI and otherwise negative.   BP (!) 146/80    Pulse 64    Ht 5\' 5"  (1.651 m)    Wt 236 lb (107 kg)    SpO2 98%    BMI 39.27 kg/m   Physical Examination: General: Well developed, well nourished, NAD HEENT: OP clear, mucus membranes moist SKIN: warm, dry. No rashes. Neuro: No focal deficits Musculoskeletal: Muscle strength 5/5 all ext Psychiatric: Mood and affect normal Neck: No JVD, no carotid bruits, no thyromegaly, no lymphadenopathy. Lungs:Clear bilaterally, no wheezes, rhonci, crackles Cardiovascular: Regular rate and rhythm. No murmurs, gallops or rubs. Abdomen:Soft.  Bowel sounds present. Non-tender.  Extremities: No lower extremity edema. Pulses are 2 + in the bilateral DP/PT.  EKG is performed today The EKG demonstrates sinus, inferior TWI, chronic        Component Value Date/Time   CHOL 173 05/03/2020 0933   TRIG 96 05/03/2020 0933   TRIG 114 02/04/2010 0000   HDL 58 05/03/2020 0933   CHOLHDL 3.0 05/03/2020 0933   CHOLHDL 4.4 06/29/2017 0840   VLDL 41 (H) 06/29/2017 0840   LDLCALC 98 05/03/2020 0933   Last Weight  Most recent update: 04/24/2021  9:23 AM    Weight  107 kg (236 lb)              Assessment and Plan:   1. CAD without angina: No chest pain. Nuclear stress test in October 2022 with no evidence of ischemia.  Continue ASA, Plavix, statin and beta blocker.   2. Carotid artery disease: Followed by [facility]  3. HTN: BP is elevated at home and here today. She did not tolerate Norvasc. Will increase Losartan to 100 mg daily. BMET today and in two weeks.   4. HLD: LDL 98 in February 2022. Continue statin. She reports not tolerating higher doses of statins. Will repeat lipids and LFTS now. If LDL not at goal of 70, will add Zetia.   5. Chronic diastolic CHF: Weight is stable. No volume overload on exam. Continue Lasix  Disposition: Follow up with me in six months  March 2022 04/24/2021 10:04 AM

## 2021-04-24 ENCOUNTER — Other Ambulatory Visit: Payer: Self-pay

## 2021-04-24 ENCOUNTER — Encounter: Payer: Self-pay | Admitting: Cardiovascular Disease

## 2021-04-24 ENCOUNTER — Ambulatory Visit (INDEPENDENT_AMBULATORY_CARE_PROVIDER_SITE_OTHER): Payer: Medicare Other | Admitting: Cardiovascular Disease

## 2021-04-24 VITALS — BP 146/80 | HR 64 | Ht 65.0 in | Wt 236.0 lb

## 2021-04-24 DIAGNOSIS — E782 Mixed hyperlipidemia: Secondary | ICD-10-CM | POA: Diagnosis not present

## 2021-04-24 DIAGNOSIS — I2510 Atherosclerotic heart disease of native coronary artery without angina pectoris: Secondary | ICD-10-CM

## 2021-04-24 DIAGNOSIS — I10 Essential (primary) hypertension: Secondary | ICD-10-CM | POA: Diagnosis not present

## 2021-04-24 DIAGNOSIS — I5032 Chronic diastolic (congestive) heart failure: Secondary | ICD-10-CM

## 2021-04-24 DIAGNOSIS — I6523 Occlusion and stenosis of bilateral carotid arteries: Secondary | ICD-10-CM

## 2021-04-24 LAB — HEPATIC FUNCTION PANEL
ALT: 12 IU/L (ref 0–32)
AST: 16 IU/L (ref 0–40)
Albumin: 4.1 g/dL (ref 3.8–4.8)
Alkaline Phosphatase: 112 IU/L (ref 44–121)
Bilirubin Total: <0.2 mg/dL (ref 0.0–1.2)
Bilirubin, Direct: <0.1 mg/dL (ref 0.00–0.40)
Total Protein: 7.1 g/dL (ref 6.0–8.5)

## 2021-04-24 LAB — LIPID PANEL
Chol/HDL Ratio: 2.5 ratio (ref 0.0–4.4)
Cholesterol, Total: 173 mg/dL (ref 100–199)
HDL: 68 mg/dL (ref >39)
LDL Chol Calc (NIH): 94 mg/dL (ref 0–99)
Triglycerides: 55 mg/dL (ref 0–149)
VLDL Cholesterol Cal: 11 mg/dL (ref 5–40)

## 2021-04-24 LAB — BASIC METABOLIC PANEL
BUN/Creatinine Ratio: 23 (ref 12–28)
BUN: 20 mg/dL (ref 8–27)
CO2: 25 mmol/L (ref 20–29)
Calcium: 9.8 mg/dL (ref 8.7–10.3)
Chloride: 104 mmol/L (ref 96–106)
Creatinine, Ser: 0.86 mg/dL (ref 0.57–1.00)
Glucose: 105 mg/dL — ABNORMAL HIGH (ref 70–99)
Potassium: 4.5 mmol/L (ref 3.5–5.2)
Sodium: 140 mmol/L (ref 134–144)
eGFR: 74 mL/min/{1.73_m2} (ref >59)

## 2021-04-24 MED ORDER — LOSARTAN POTASSIUM 100 MG PO TABS: 100.0000 mg | ORAL_TABLET | Freq: Every day | ORAL | 3 refills | Status: AC

## 2021-04-24 MED ORDER — NITROGLYCERIN 0.4 MG SL SUBL: 0.4000 mg | SUBLINGUAL_TABLET | SUBLINGUAL | 3 refills | Status: AC | PRN

## 2021-04-24 NOTE — Patient Instructions (Signed)
Medication Instructions:  Your physician has recommended you make the following change in your medication:  1.) increase losartan to 100 mg daily  *If you need a refill on your cardiac medications before your next appointment, please call your pharmacy*   Lab Work: Today: bmet, lipids, liver function AND Please return around 05/06/21 for more blood work Artist)  If you have labs (blood work) drawn today and your tests are completely normal, you will receive your results only by: [employer] (if you have MyChart) OR A paper copy in the mail If you have any lab test that is abnormal or we need to change your treatment, we will call you to review the results.   Testing/Procedures: none   Follow-Up: At [facility], you and your health needs are our priority.  As part of our continuing mission to provide you with exceptional heart care, we have created designated Provider Care Teams.  These Care Teams include your primary Cardiologist (physician) and Advanced Practice Providers (APPs -  Physician Assistants and Nurse Practitioners) who all work together to provide you with the care you need, when you need it.   Your next appointment:   6 month(s)  The format for your next appointment:   In Person  Provider:   Lauree Chandler, MD     Other Instructions

## 2021-04-26 ENCOUNTER — Telehealth: Payer: Self-pay | Admitting: *Deleted

## 2021-04-26 DIAGNOSIS — I2510 Atherosclerotic heart disease of native coronary artery without angina pectoris: Secondary | ICD-10-CM

## 2021-04-26 DIAGNOSIS — E782 Mixed hyperlipidemia: Secondary | ICD-10-CM

## 2021-04-26 MED ORDER — EZETIMIBE 10 MG PO TABS: 10.0000 mg | ORAL_TABLET | Freq: Every day | ORAL | 3 refills | Status: DC

## 2021-04-26 NOTE — Telephone Encounter (Signed)
Spoke w patient.  Reviewed her lab work and recommendations.  She will add Zetia 10 mg daily and return for lipid panel in 3 months.  Appointment scheduled.  Pt concerned with blood glucose of 105.  We reviewed that her A1c in November 2022 was normal 5.4.  I adv I will forward the results to her primary care provider for review.  Pt appreciative for all information provided.

## 2021-04-26 NOTE — Telephone Encounter (Signed)
-----   Message from Kathleene Hazel, MD sent at 04/25/2021  9:58 AM EST ----- BMET ok. LFTs ok. LDL not at goal. Continue low dose statin (she does not tolerate higher doses) and add Zetia 10 mg daily. Repeat lipids and LFTs in 12 weeks. cdm

## 2021-05-06 ENCOUNTER — Other Ambulatory Visit: Payer: Medicare Other

## 2021-05-10 ENCOUNTER — Other Ambulatory Visit: Payer: Self-pay

## 2021-05-10 ENCOUNTER — Other Ambulatory Visit: Payer: Medicare Other | Admitting: *Deleted

## 2021-05-10 DIAGNOSIS — I6523 Occlusion and stenosis of bilateral carotid arteries: Secondary | ICD-10-CM

## 2021-05-10 DIAGNOSIS — I2510 Atherosclerotic heart disease of native coronary artery without angina pectoris: Secondary | ICD-10-CM

## 2021-05-10 DIAGNOSIS — I5032 Chronic diastolic (congestive) heart failure: Secondary | ICD-10-CM

## 2021-05-10 DIAGNOSIS — E782 Mixed hyperlipidemia: Secondary | ICD-10-CM

## 2021-05-10 DIAGNOSIS — I10 Essential (primary) hypertension: Secondary | ICD-10-CM

## 2021-05-11 LAB — BASIC METABOLIC PANEL
BUN/Creatinine Ratio: 23 (ref 12–28)
BUN: 19 mg/dL (ref 8–27)
CO2: 23 mmol/L (ref 20–29)
Calcium: 9.3 mg/dL (ref 8.7–10.3)
Chloride: 104 mmol/L (ref 96–106)
Creatinine, Ser: 0.84 mg/dL (ref 0.57–1.00)
Glucose: 83 mg/dL (ref 70–99)
Potassium: 4.2 mmol/L (ref 3.5–5.2)
Sodium: 140 mmol/L (ref 134–144)
eGFR: 76 mL/min/{1.73_m2} (ref >59)

## 2021-06-01 ENCOUNTER — Other Ambulatory Visit (INDEPENDENT_AMBULATORY_CARE_PROVIDER_SITE_OTHER): Payer: Self-pay | Admitting: Family Medicine

## 2021-06-01 DIAGNOSIS — E559 Vitamin D deficiency, unspecified: Secondary | ICD-10-CM

## 2021-06-03 ENCOUNTER — Other Ambulatory Visit (INDEPENDENT_AMBULATORY_CARE_PROVIDER_SITE_OTHER): Payer: Self-pay | Admitting: Family Medicine

## 2021-06-03 DIAGNOSIS — E559 Vitamin D deficiency, unspecified: Secondary | ICD-10-CM

## 2021-06-04 NOTE — Telephone Encounter (Signed)
Dr.Opalski ?

## 2021-06-05 ENCOUNTER — Other Ambulatory Visit (INDEPENDENT_AMBULATORY_CARE_PROVIDER_SITE_OTHER): Payer: Self-pay | Admitting: Adult Health

## 2021-06-05 DIAGNOSIS — R7303 Prediabetes: Secondary | ICD-10-CM

## 2021-06-21 ENCOUNTER — Encounter (HOSPITAL_COMMUNITY): Payer: Medicare Other

## 2021-06-21 ENCOUNTER — Ambulatory Visit: Payer: Medicare Other

## 2021-06-24 ENCOUNTER — Ambulatory Visit (INDEPENDENT_AMBULATORY_CARE_PROVIDER_SITE_OTHER)
Admission: RE | Admit: 2021-06-24 | Discharge: 2021-06-24 | Disposition: A | Discharge status: Home / Self Care | Payer: Medicare Other | Source: Ambulatory Visit | Attending: Physician Assistant | Admitting: Physician Assistant

## 2021-06-24 ENCOUNTER — Ambulatory Visit (HOSPITAL_COMMUNITY)
Admission: RE | Admit: 2021-06-24 | Discharge: 2021-06-24 | Disposition: A | Discharge status: Home / Self Care | Payer: Medicare Other | Source: Ambulatory Visit | Attending: Physician Assistant | Admitting: Physician Assistant

## 2021-06-24 DIAGNOSIS — I739 Peripheral vascular disease, unspecified: Secondary | ICD-10-CM

## 2021-06-24 DIAGNOSIS — I6523 Occlusion and stenosis of bilateral carotid arteries: Secondary | ICD-10-CM

## 2021-07-02 ENCOUNTER — Other Ambulatory Visit (INDEPENDENT_AMBULATORY_CARE_PROVIDER_SITE_OTHER): Payer: Self-pay | Admitting: Family Medicine

## 2021-07-02 DIAGNOSIS — E559 Vitamin D deficiency, unspecified: Secondary | ICD-10-CM

## 2021-07-11 ENCOUNTER — Other Ambulatory Visit: Payer: Self-pay | Admitting: Family Medicine

## 2021-07-11 DIAGNOSIS — R7303 Prediabetes: Secondary | ICD-10-CM

## 2021-07-19 ENCOUNTER — Ambulatory Visit: Payer: Medicare Other | Admitting: Vascular Surgery

## 2021-08-02 ENCOUNTER — Ambulatory Visit: Payer: Medicare Other | Admitting: Vascular Surgery

## 2021-08-13 ENCOUNTER — Other Ambulatory Visit: Payer: Self-pay

## 2021-08-13 DIAGNOSIS — I10 Essential (primary) hypertension: Secondary | ICD-10-CM

## 2021-08-13 MED ORDER — CARVEDILOL 6.25 MG PO TABS: 6.2500 mg | ORAL_TABLET | Freq: Two times a day (BID) | ORAL | 3 refills | Status: DC

## 2021-08-13 NOTE — Telephone Encounter (Signed)
Refill request for  Carvedilol 6.25 mg LR  10/12/20, #180, 1 rf LOV 04/02/21 FOV none scheduled.  Please review and advise.   Thanks. Dm/cma

## 2021-10-23 ENCOUNTER — Encounter (INDEPENDENT_AMBULATORY_CARE_PROVIDER_SITE_OTHER): Payer: Self-pay

## 2021-10-23 NOTE — Progress Notes (Unsigned)
Chief Complaint:  Follow up:  ***  History of Present Illness: 71 yo female with history of CAD, hyperlipidemia, hypothyroidism, sleep apnea on CPAP, Graves disease s/p thyroid ablation, mild PAD, mild COPD, chronic diastolic CHF, carotid artery disease and HTN here for cardiac follow up. She had an MI in 06/2009 and had 2 DES placed in the RCA. She had chest pains leading to a stress test and cardiac cath in December 2012. Cardiac cath December 2012 with stable CAD. She had been followed in our practice by Dr. Olevia Perches and then by me. I saw her last in 2015. She was then followed by Dr. Terrence Dupont. Cardiac cath in 2017 with severe RCA stenosis treated with a drug eluting stent. Mild disease in the LAD and Circumflex. She was seen by Dr. Fletcher Anon in October 2022 for pre-operative assessment and reported dyspnea but no chest pain. Nuclear stress test November 2022 with no evidence of ischemia.  She is here today for follow up. The patient denies any chest pain, dyspnea, palpitations, lower extremity edema, orthopnea, PND, dizziness, near syncope or syncope.   Primary Care Physician: Haydee Salter, MD  Past Medical History:  Diagnosis Date   Anxiety    Arthritis    "legs" (01/07/2016)   Back pain    Back pain    CAD 06/2009   a.  s/p MI 4/11: tx with DES x 2 to RCA;  b.  LHC 03/17/11: LAD 30-40%, distal LAD 30-40%, OM2 40-50%, RCA stent patent, EF greater than 70%.;  c.  Lexiscan Myoview (12/15):  Mild apical thinning, no ischemia, EF 61%; normal study   CAROTID STENOSIS    carotid Dopplers [id number]: RICA [id number]; LICA [id number].  Follow up recommended in 6 months.   Constipation    Constipation    Depression    Gout    "on RX for it" (01/07/2016)   Heart disease    History of heart attack    Hx of blood clots    HYPERLIPIDEMIA    HYPERTENSION    HYPOTHYROIDISM    post ablation tx Graves   Hypothyroidism    Joint pain    Myocardial infarction [facility]) 2012   "mini"   OSA on CPAP     Osteoarthritis    Pre-diabetes    PVD    Rheumatoid arthritis ([place])    Sleep apnea    SOBOE (shortness of breath on exertion)    TOBACCO ABUSE quit 06/2009   Varicose veins    VITAMIN D DEFICIENCY    DEXA 04/2009 normal    Past Surgical History:  Procedure Laterality Date   CARDIAC CATHETERIZATION N/A 01/07/2016   Procedure: Left Heart Cath and Coronary Angiography;  Surgeon: Charolette Forward, MD;  Location: [place] CV LAB;  Service: Cardiovascular;  Laterality: N/A;   CARDIAC CATHETERIZATION N/A 01/07/2016   Procedure: Coronary Stent Intervention;  Surgeon: Charolette Forward, MD;  Location: [place] CV LAB;  Service: Cardiovascular;  Laterality: N/A;   CAROTID STENT Left    bilateral carotid artery disease post stent of left carotid   CESAREAN SECTION  1985   "twins"   CORONARY ANGIOPLASTY     LEFT HEART CATHETERIZATION WITH CORONARY ANGIOGRAM N/A 03/17/2011   Procedure: LEFT HEART CATHETERIZATION WITH CORONARY ANGIOGRAM;  Surgeon: Hillary Bow, MD;  Location: [facility] CATH LAB;  Service: Cardiovascular;  Laterality: N/A;   PERIPHERAL VASCULAR CATHETERIZATION N/A 04/06/2015   Procedure: Abdominal Aortogram;  Surgeon: Elam Dutch, MD;  Location: [place]  CV LAB;  Service: Cardiovascular;  Laterality: N/A;   RADIOACTIVE PLAQUE INSERTION     Graves disease post radioactive treatment complicated hypothyroidism   TOTAL HIP ARTHROPLASTY Left 01/22/2021   Procedure: LEFT TOTAL HIP ARTHROPLASTY ANTERIOR APPROACH;  Surgeon: Marcene Corning, MD;  Location: [facility] ORS;  Service: Orthopedics;  Laterality: Left;    Current Outpatient Medications  Medication Sig Dispense Refill   allopurinol (ZYLOPRIM) 300 MG tablet Take 300 mg by mouth daily.     aspirin EC 81 MG tablet Take 81 mg by mouth daily. Swallow whole.     carvedilol (COREG) 6.25 MG tablet Take 1 tablet (6.25 mg total) by mouth 2 (two) times daily with a meal. 180 tablet 3   Cholecalciferol (VITAMIN D) 50 MCG (2000 UT) tablet Take 1  tablet (2,000 Units total) by mouth daily.     clopidogrel (PLAVIX) 75 MG tablet Take 75 mg by mouth daily.     colchicine 0.6 MG tablet Take 1.2 mg by mouth daily as needed (Gout flare ups).     ezetimibe (ZETIA) 10 MG tablet Take 1 tablet (10 mg total) by mouth daily. 90 tablet 3   furosemide (LASIX) 40 MG tablet Take 40 mg by mouth daily.     levothyroxine (SYNTHROID, LEVOTHROID) 125 MCG tablet Take 125 mcg by mouth daily before breakfast.     LINZESS 145 MCG CAPS capsule Take 1 capsule (145 mcg total) by mouth daily. 30 capsule 11   losartan (COZAAR) 100 MG tablet Take 1 tablet (100 mg total) by mouth daily. 90 tablet 3   nitroGLYCERIN (NITROSTAT) 0.4 MG SL tablet Place 1 tablet (0.4 mg total) under the tongue every 5 (five) minutes as needed for chest pain. 25 tablet 3   OZEMPIC, 1 MG/DOSE, 4 MG/3ML SOPN INJECT 1 MG WEEKLY 3 mL 1   potassium chloride (KLOR-CON) 10 MEQ tablet Take 1 tablet (10 mEq total) by mouth daily as needed. Taking 1 tablet when taking Lasix 30 tablet 0   rosuvastatin (CRESTOR) 10 MG tablet One tab po every other night b-4 bed (Patient taking differently: Take 10 mg by mouth every other day. Every other day at bedtime)     Selenium 200 MCG CAPS Take 200 mcg by mouth daily.     Vitamin D, Ergocalciferol, (DRISDOL) 1.25 MG (50000 UNIT) CAPS capsule 1 PO Q 5 DAYS (Patient taking differently: Take 50,000 Units by mouth every 7 (seven) days.) 6 capsule 0   zinc gluconate 50 MG tablet Take 50 mg by mouth daily.     No current facility-administered medications for this visit.    Allergies  Allergen Reactions   Amlodipine Swelling   Lisinopril Cough   Statins Other (See Comments)    Myalgia    Social History   Socioeconomic History   Marital status: Divorced    Spouse name: Not on file   Number of children: Not on file   Years of education: Not on file   Highest education level: Not on file  Occupational History   Occupation: retired, Comptroller for mental health   Tobacco Use   Smoking status: Former    Packs/day: 1.00    Years: 35.00    Total pack years: 35.00    Types: Cigarettes    Quit date: 06/30/2009    Years since quitting: 12.3   Smokeless tobacco: Never  Vaping Use   Vaping Use: Never used  Substance and Sexual Activity   Alcohol use: Yes    Alcohol/week: 2.0 standard  drinks of alcohol    Types: 2 Glasses of wine per week    Comment: occ   Drug use: No   Sexual activity: Not Currently    Comment: Quit smoking after MI- prev 1ppd x 35ys  Other Topics Concern   Not on file  Social History Narrative   Not on file   Social Determinants of Health   Financial Resource Strain: Not on file  Food Insecurity: Not on file  Transportation Needs: Not on file  Physical Activity: Not on file  Stress: Not on file  Social Connections: Not on file  Intimate Partner Violence: Not on file    Family History  Problem Relation Age of Onset   Heart attack Mother    Stroke Mother    Cancer Mother    Varicose Veins Mother    Thyroid disease Mother    Hypertension Mother    Heart disease Mother    Sudden death Mother    Depression Mother    Cancer Father    Heart disease Father    Hypertension Father    Coronary artery disease Other    Hypertension Other    Hypertension Sister    Breast cancer Paternal Aunt     Review of Systems:  As stated in the HPI and otherwise negative.   There were no vitals taken for this visit.  Physical Examination: General: Well developed, well nourished, NAD  HEENT: OP clear, mucus membranes moist  SKIN: warm, dry. No rashes. Neuro: No focal deficits  Musculoskeletal: Muscle strength 5/5 all ext  Psychiatric: Mood and affect normal  Neck: No JVD, no carotid bruits, no thyromegaly, no lymphadenopathy.  Lungs:Clear bilaterally, no wheezes, rhonci, crackles Cardiovascular: Regular rate and rhythm. No murmurs, gallops or rubs. Abdomen:Soft. Bowel sounds present. Non-tender.  Extremities: No lower  extremity edema. Pulses are 2 + in the bilateral DP/PT.  EKG is *** performed today The EKG demonstrates      Lipid Panel     Component Value Date/Time   CHOL 173 04/24/2021 1010   TRIG 55 04/24/2021 1010   TRIG 114 02/04/2010 0000   HDL 68 04/24/2021 1010   CHOLHDL 2.5 04/24/2021 1010   CHOLHDL 4.4 06/29/2017 0840   VLDL 41 (H) 06/29/2017 0840   LDLCALC 94 04/24/2021 1010   LDLDIRECT 156.4 08/02/2012 1713   LABVLDL 11 04/24/2021 1010      Assessment and Plan:   1. CAD without angina: She has no chest pain suggestive of angina. Nuclear stress test in October 2022 with no evidence of ischemia.  Will continue ASA, Plavix, beta blocker and statin.    2. Carotid artery disease: Followed by [facility]  3. HTN: BP is ***.  She did not tolerate Norvasc. Continue current medications.   4. HLD: LDL not at goal in February 2023 on Zetia and low dose Crestor. She reports not tolerating higher doses of statins. LDL is not at goal. Will refer to the lipid clinic to discuss Repatha.   5. Chronic diastolic CHF: Weight is stable. No evidence of volume overload on exam. Will continue Lasix.   Disposition: Follow up with me in 12 months  Verne Carrow 10/23/2021 5:02 PM

## 2021-10-24 ENCOUNTER — Encounter: Payer: Self-pay | Admitting: Cardiovascular Disease

## 2021-10-24 ENCOUNTER — Ambulatory Visit (INDEPENDENT_AMBULATORY_CARE_PROVIDER_SITE_OTHER): Payer: Medicare Other | Admitting: Cardiovascular Disease

## 2021-10-24 VITALS — BP 104/54 | HR 60 | Ht 65.0 in | Wt 236.8 lb

## 2021-10-24 DIAGNOSIS — I10 Essential (primary) hypertension: Secondary | ICD-10-CM | POA: Diagnosis not present

## 2021-10-24 DIAGNOSIS — I5032 Chronic diastolic (congestive) heart failure: Secondary | ICD-10-CM

## 2021-10-24 DIAGNOSIS — I6523 Occlusion and stenosis of bilateral carotid arteries: Secondary | ICD-10-CM

## 2021-10-24 DIAGNOSIS — I2510 Atherosclerotic heart disease of native coronary artery without angina pectoris: Secondary | ICD-10-CM | POA: Diagnosis not present

## 2021-10-24 DIAGNOSIS — E782 Mixed hyperlipidemia: Secondary | ICD-10-CM

## 2021-10-24 NOTE — Patient Instructions (Signed)
Medication Instructions:  No changes *If you need a refill on your cardiac medications before your next appointment, please call your pharmacy*   Lab Work: Fasting lipid panel and liver function when you return for echocardiogram  If you have labs (blood work) drawn today and your tests are completely normal, you will receive your results only by: MyChart Message (if you have MyChart) OR A paper copy in the mail If you have any lab test that is abnormal or we need to change your treatment, we will call you to review the results.   Testing/Procedures: Your physician has requested that you have an echocardiogram. Echocardiography is a painless test that uses sound waves to create images of your heart. It provides your doctor with information about the size and shape of your heart and how well your heart's chambers and valves are working. This procedure takes approximately one hour. There are no restrictions for this procedure.   Follow-Up: At [facility], you and your health needs are our priority.  As part of our continuing mission to provide you with exceptional heart care, we have created designated Provider Care Teams.  These Care Teams include your primary Cardiologist (physician) and Advanced Practice Providers (APPs -  Physician Assistants and Nurse Practitioners) who all work together to provide you with the care you need, when you need it.  We recommend signing up for the patient portal called "MyChart".  Sign up information is provided on this After Visit Summary.  MyChart is used to connect with patients for Virtual Visits (Telemedicine).  Patients are able to view lab/test results, encounter notes, upcoming appointments, etc.  Non-urgent messages can be sent to your provider as well.   To learn more about what you can do with MyChart, go to [email or web address].    Your next appointment:   3-4 month(s)  The format for your next appointment:   In Person  Provider:    Verne Carrow, MD     Important Information About Sugar

## 2021-11-05 ENCOUNTER — Ambulatory Visit (HOSPITAL_COMMUNITY): Payer: Medicare Other

## 2021-11-05 ENCOUNTER — Other Ambulatory Visit: Payer: Medicare Other

## 2021-11-05 DIAGNOSIS — I6523 Occlusion and stenosis of bilateral carotid arteries: Secondary | ICD-10-CM

## 2021-11-05 DIAGNOSIS — I5032 Chronic diastolic (congestive) heart failure: Secondary | ICD-10-CM

## 2021-11-05 DIAGNOSIS — E782 Mixed hyperlipidemia: Secondary | ICD-10-CM

## 2021-11-05 DIAGNOSIS — I10 Essential (primary) hypertension: Secondary | ICD-10-CM

## 2021-11-05 DIAGNOSIS — I2510 Atherosclerotic heart disease of native coronary artery without angina pectoris: Secondary | ICD-10-CM

## 2021-11-05 LAB — HEPATIC FUNCTION PANEL
ALT: 12 IU/L (ref 0–32)
AST: 16 IU/L (ref 0–40)
Albumin: 4.2 g/dL (ref 3.9–4.9)
Alkaline Phosphatase: 117 IU/L (ref 44–121)
Bilirubin Total: 0.3 mg/dL (ref 0.0–1.2)
Bilirubin, Direct: 0.11 mg/dL (ref 0.00–0.40)
Total Protein: 6.8 g/dL (ref 6.0–8.5)

## 2021-11-05 LAB — LIPID PANEL
Chol/HDL Ratio: 2.5 ratio (ref 0.0–4.4)
Cholesterol, Total: 142 mg/dL (ref 100–199)
HDL: 56 mg/dL (ref >39)
LDL Chol Calc (NIH): 67 mg/dL (ref 0–99)
Triglycerides: 104 mg/dL (ref 0–149)
VLDL Cholesterol Cal: 19 mg/dL (ref 5–40)

## 2021-11-07 ENCOUNTER — Ambulatory Visit (HOSPITAL_COMMUNITY): Payer: Medicare Other | Attending: Cardiovascular Disease

## 2021-11-07 DIAGNOSIS — I6523 Occlusion and stenosis of bilateral carotid arteries: Secondary | ICD-10-CM | POA: Diagnosis present

## 2021-11-07 DIAGNOSIS — E782 Mixed hyperlipidemia: Secondary | ICD-10-CM | POA: Insufficient documentation

## 2021-11-07 DIAGNOSIS — I2510 Atherosclerotic heart disease of native coronary artery without angina pectoris: Secondary | ICD-10-CM | POA: Diagnosis present

## 2021-11-07 DIAGNOSIS — I5032 Chronic diastolic (congestive) heart failure: Secondary | ICD-10-CM | POA: Diagnosis not present

## 2021-11-07 DIAGNOSIS — I10 Essential (primary) hypertension: Secondary | ICD-10-CM | POA: Insufficient documentation

## 2021-11-07 LAB — ECHOCARDIOGRAM COMPLETE
Area-P 1/2: 3.76 cm2
S' Lateral: 2.8 cm

## 2021-11-13 ENCOUNTER — Telehealth: Payer: Self-pay | Admitting: Cardiovascular Disease

## 2021-11-13 NOTE — Telephone Encounter (Signed)
Patient called in to discuss recent lab and ECHO results. Results reviewed along with Dr. Gibson Ramp comments.  No other questions or concerns.

## 2021-11-13 NOTE — Telephone Encounter (Signed)
Patient is requesting to discuss echo results. 

## 2021-11-28 ENCOUNTER — Other Ambulatory Visit: Payer: Self-pay | Admitting: Family Medicine

## 2021-11-28 DIAGNOSIS — R7303 Prediabetes: Secondary | ICD-10-CM

## 2022-01-09 ENCOUNTER — Telehealth: Payer: Self-pay | Admitting: Cardiovascular Disease

## 2022-01-09 NOTE — Telephone Encounter (Signed)
Patient would like to speak with Dr. Angelena Form regarding her CHF diagnosis.  Patient stated she was not aware she had this diagnosis.

## 2022-01-09 NOTE — Telephone Encounter (Signed)
I spoke with Ms. Caitlyn [name].  She wanted to speak with Dr. Angelena Form.   She was denied for life insurance because of her dx of Chronic Diastolic CHF.  She states no one ever said those words to her and so she did not know she had heart failure.    She is requesting for this to be removed from her records if possible.  Then in 2 years she could get the life insurance.  I adv that in her 11 records this is listed as well as at her most recent in August with Dr. Angelena Form.    She is on lasix.  She currently takes 80 mg every am and 40 mg every pm for puffy ankles.  She does not feel this is helping.  She wants to get a different fluid pill if MD thinks that would work.  Instead I adv to wear compression socks, limit sodium and elevate legs when sitting.     I adv I would share the information with Dr. Angelena Form and if there is anything we can do to help this we will call her back.

## 2022-01-17 NOTE — Telephone Encounter (Signed)
The patient is already scheduled with Dr. Angelena Form on 01/29/22.  This is a 3 month follow up.

## 2022-01-28 ENCOUNTER — Encounter: Payer: Self-pay | Admitting: Cardiovascular Disease

## 2022-01-28 ENCOUNTER — Ambulatory Visit: Payer: Medicare Other | Attending: Cardiovascular Disease | Admitting: Cardiovascular Disease

## 2022-01-28 VITALS — BP 136/78 | HR 53 | Ht 65.0 in | Wt 242.2 lb

## 2022-01-28 DIAGNOSIS — I2510 Atherosclerotic heart disease of native coronary artery without angina pectoris: Secondary | ICD-10-CM

## 2022-01-28 DIAGNOSIS — I10 Essential (primary) hypertension: Secondary | ICD-10-CM | POA: Diagnosis not present

## 2022-01-28 DIAGNOSIS — I5032 Chronic diastolic (congestive) heart failure: Secondary | ICD-10-CM | POA: Diagnosis not present

## 2022-01-28 DIAGNOSIS — I6523 Occlusion and stenosis of bilateral carotid arteries: Secondary | ICD-10-CM

## 2022-01-28 DIAGNOSIS — E782 Mixed hyperlipidemia: Secondary | ICD-10-CM | POA: Diagnosis not present

## 2022-01-28 MED ORDER — TORSEMIDE 20 MG PO TABS: 20.0000 mg | ORAL_TABLET | Freq: Two times a day (BID) | ORAL | 3 refills | Status: DC

## 2022-01-28 NOTE — Progress Notes (Signed)
Chief Complaint  Patient presents with   Follow-up    CAD   History of Present Illness: 71 yo female with history of CAD, hyperlipidemia, hypothyroidism, sleep apnea on CPAP, Graves disease s/p thyroid ablation, PAD, COPD, chronic diastolic CHF, carotid artery disease and HTN here for cardiac follow up. She had an MI in April 2011 and had 2 drug eluting stents placed in the RCA. She had chest pains leading to a stress test and cardiac cath in December 2012. Cardiac cath December 2012 with stable CAD.  I saw her last in 2015. She was then followed by Dr. Sharyn Lull. Cardiac cath in 2017 with severe RCA stenosis treated with a drug eluting stent. Mild disease in the LAD and Circumflex. She was seen by Dr. Kirke Corin in October 2022 for pre-operative assessment and reported dyspnea but no chest pain. Nuclear stress test November 2022 with no evidence of ischemia. Echo August 2023 with LVEF=60-65%. Mild LVH. Grade 1 diastolic dysfunction. Trivial MR.   She is here today for follow up. The patient denies any chest pain, dyspnea, palpitations, orthopnea, PND, dizziness, near syncope or syncope. She has had a subtle weight gain at home with some LE edema and is worried that her Lasix is not working well. She wants to change this.   Primary Care Provider: Loura Back, NP  Past Medical History:  Diagnosis Date   Anxiety    Arthritis    "legs" (01/07/2016)   Back pain    Back pain    CAD 06/2009   a.  s/p MI 4/11: tx with DES x 2 to RCA;  b.  LHC 03/17/11: LAD 30-40%, distal LAD 30-40%, OM2 40-50%, RCA stent patent, EF greater than 70%.;  c.  Lexiscan Myoview (12/15):  Mild apical thinning, no ischemia, EF 61%; normal study   CAROTID STENOSIS    carotid Dopplers 03/25/11: RICA 60-79%; LICA 0-39%.  Follow up recommended in 6 months.   Constipation    Constipation    Depression    Gout    "on RX for it" (01/07/2016)   Heart disease    History of heart attack    Hx of blood clots    HYPERLIPIDEMIA     HYPERTENSION    HYPOTHYROIDISM    post ablation tx Graves   Hypothyroidism    Joint pain    Myocardial infarction [facility]) 2012   "mini"   OSA on CPAP    Osteoarthritis    Pre-diabetes    PVD    Rheumatoid arthritis (HCC)    Sleep apnea    SOBOE (shortness of breath on exertion)    TOBACCO ABUSE quit 06/2009   Varicose veins    VITAMIN D DEFICIENCY    DEXA 04/2009 normal    Past Surgical History:  Procedure Laterality Date   CARDIAC CATHETERIZATION N/A 01/07/2016   Procedure: Left Heart Cath and Coronary Angiography;  Surgeon: Rinaldo Cloud, MD;  [facility] INVASIVE CV LAB;  Service: Cardiovascular;  Laterality: N/A;   CARDIAC CATHETERIZATION N/A 01/07/2016   Procedure: Coronary Stent Intervention;  Surgeon: Rinaldo Cloud, MD;  Location: [facility] INVASIVE CV LAB;  Service: Cardiovascular;  Laterality: N/A;   CAROTID STENT Left    bilateral carotid artery disease post stent of left carotid   CESAREAN SECTION  1985   "twins"   CORONARY ANGIOPLASTY     LEFT HEART CATHETERIZATION WITH CORONARY ANGIOGRAM N/A 03/17/2011   Procedure: LEFT HEART CATHETERIZATION WITH CORONARY ANGIOGRAM;  Surgeon: Herby Abraham, MD;  Location: [facility] CATH LAB;  Service: Cardiovascular;  Laterality: N/A;   PERIPHERAL VASCULAR CATHETERIZATION N/A 04/06/2015   Procedure: Abdominal Aortogram;  Surgeon: Sherren Kerns, MD;  Location: [facility] INVASIVE CV LAB;  Service: Cardiovascular;  Laterality: N/A;   RADIOACTIVE PLAQUE INSERTION     Graves disease post radioactive treatment complicated hypothyroidism   TOTAL HIP ARTHROPLASTY Left 01/22/2021   Procedure: LEFT TOTAL HIP ARTHROPLASTY ANTERIOR APPROACH;  Surgeon: Marcene Corning, MD;  Location: [facility] ORS;  Service: Orthopedics;  Laterality: Left;    Current Outpatient Medications  Medication Sig Dispense Refill   torsemide (DEMADEX) 20 MG tablet Take 1 tablet (20 mg total) by mouth 2 (two) times daily. 180 tablet 3   allopurinol (ZYLOPRIM) 300 MG tablet Take 300 mg by  mouth daily.     carvedilol (COREG) 6.25 MG tablet Take 1 tablet (6.25 mg total) by mouth 2 (two) times daily with a meal. 180 tablet 3   Cholecalciferol (VITAMIN D) 50 MCG (2000 UT) tablet Take 1 tablet (2,000 Units total) by mouth daily.     clopidogrel (PLAVIX) 75 MG tablet Take 75 mg by mouth daily.     colchicine 0.6 MG tablet Take 1.2 mg by mouth daily as needed (Gout flare ups).     ezetimibe (ZETIA) 10 MG tablet Take 1 tablet (10 mg total) by mouth daily. 90 tablet 3   levothyroxine (SYNTHROID, LEVOTHROID) 125 MCG tablet Take 125 mcg by mouth daily before breakfast.     LINZESS 145 MCG CAPS capsule Take 1 capsule (145 mcg total) by mouth daily. 30 capsule 11   losartan (COZAAR) 100 MG tablet Take 1 tablet (100 mg total) by mouth daily. 90 tablet 3   nitroGLYCERIN (NITROSTAT) 0.4 MG SL tablet Place 1 tablet (0.4 mg total) under the tongue every 5 (five) minutes as needed for chest pain. 25 tablet 3   OZEMPIC, 1 MG/DOSE, 4 MG/3ML SOPN INJECT 1 MG WEEKLY (Patient not taking: Reported on 10/24/2021) 3 mL 1   potassium chloride (KLOR-CON) 10 MEQ tablet Take 1 tablet (10 mEq total) by mouth daily as needed. Taking 1 tablet when taking Lasix (Patient taking differently: Take 10 mEq by mouth daily.) 30 tablet 0   rosuvastatin (CRESTOR) 40 MG tablet Take 20 mg by mouth at bedtime.     Selenium 200 MCG CAPS Take 200 mcg by mouth daily.     spironolactone (ALDACTONE) 25 MG tablet Take 25 mg by mouth daily.     tiZANidine (ZANAFLEX) 4 MG tablet Take 4 mg by mouth every 6 (six) hours as needed.     Vitamin D, Ergocalciferol, (DRISDOL) 1.25 MG (50000 UNIT) CAPS capsule 1 PO Q 5 DAYS (Patient taking differently: Take 50,000 Units by mouth every 7 (seven) days.) 6 capsule 0   zinc gluconate 50 MG tablet Take 50 mg by mouth daily.     No current facility-administered medications for this visit.    Allergies  Allergen Reactions   Amlodipine Swelling   Lisinopril Cough   Statins Other (See Comments)     Myalgia    Social History   Socioeconomic History   Marital status: Divorced    Spouse name: Not on file   Number of children: Not on file   Years of education: Not on file   Highest education level: Not on file  Occupational History   Occupation: retired, Comptroller for mental health  Tobacco Use   Smoking status: Former    Packs/day: 1.00    Years: 35.00  Total pack years: 35.00    Types: Cigarettes    Quit date: 06/30/2009    Years since quitting: 12.5   Smokeless tobacco: Never  Vaping Use   Vaping Use: Never used  Substance and Sexual Activity   Alcohol use: Yes    Alcohol/week: 2.0 standard drinks of alcohol    Types: 2 Glasses of wine per week    Comment: occ   Drug use: No   Sexual activity: Not Currently    Comment: Quit smoking after MI- prev 1ppd x 35ys  Other Topics Concern   Not on file  Social History Narrative   Not on file   Social Determinants of Health   Financial Resource Strain: Not on file  Food Insecurity: Not on file  Transportation Needs: Not on file  Physical Activity: Not on file  Stress: Not on file  Social Connections: Not on file  Intimate Partner Violence: Not on file    Family History  Problem Relation Age of Onset   Heart attack Mother    Stroke Mother    Cancer Mother    Varicose Veins Mother    Thyroid disease Mother    Hypertension Mother    Heart disease Mother    Sudden death Mother    Depression Mother    Cancer Father    Heart disease Father    Hypertension Father    Coronary artery disease Other    Hypertension Other    Hypertension Sister    Breast cancer Paternal Aunt     Review of Systems:  As stated in the HPI and otherwise negative.   BP 136/78   Pulse (!) 53   Ht 5\' 5"  (1.651 m)   Wt 242 lb 3.2 oz (109.9 kg)   SpO2 99%   BMI 40.30 kg/m   Physical Examination: General: Well developed, well nourished, NAD  HEENT: OP clear, mucus membranes moist  SKIN: warm, dry. No rashes. Neuro: No focal  deficits  Musculoskeletal: Muscle strength 5/5 all ext  Psychiatric: Mood and affect normal  Neck: No JVD, no carotid bruits, no thyromegaly, no lymphadenopathy.  Lungs:Clear bilaterally, no wheezes, rhonci, crackles Cardiovascular: Regular rate and rhythm. No murmurs, gallops or rubs. Abdomen:Soft. Bowel sounds present. Non-tender.  Extremities: No lower extremity edema. Pulses are 2 + in the bilateral DP/PT.  EKG is  performed today The EKG demonstrates Sinus brady, rate 53 bpm. Non-specific ST abnormality  Echo 11/07/21:  1. Left ventricular ejection fraction, by estimation, is 60 to 65%. The  left ventricle has normal function. The left ventricle has no regional  wall motion abnormalities. There is mild left ventricular hypertrophy.  Left ventricular diastolic parameters  are consistent with Grade I diastolic dysfunction (impaired relaxation).   2. Right ventricular systolic function is normal. The right ventricular  size is normal.   3. Left atrial size was mildly dilated.   4. The mitral valve is normal in structure. Trivial mitral valve  regurgitation. No evidence of mitral stenosis.   5. The aortic valve is tricuspid. Aortic valve regurgitation is not  visualized. Aortic valve sclerosis/calcification is present, without any  evidence of aortic stenosis.    Lipid Panel     Component Value Date/Time   CHOL 142 11/05/2021 0846   TRIG 104 11/05/2021 0846   TRIG 114 02/04/2010 0000   HDL 56 11/05/2021 0846   CHOLHDL 2.5 11/05/2021 0846   CHOLHDL 4.4 06/29/2017 0840   VLDL 41 (H) 06/29/2017 0840   LDLCALC 67  11/05/2021 0846   LDLDIRECT 156.4 08/02/2012 1713   LABVLDL 19 11/05/2021 0846      Assessment and Plan:   1. CAD without angina: No chest pain. Echo August 2023 with normal LV function, grade 1 diastolic dysfunction. Nuclear stress test in October 2022 with no evidence of ischemia.  Continue Plavix, statin and beta blocker.    2. Carotid artery disease: Followed by  [facility]  3. HTN: BP is well controlled. She did not tolerate Norvasc. Continue current medications.   4. HLD: LDL 67 in August 2023. Continue Zetia and Crestor.   5. Chronic diastolic CHF: Weight is up at home but no significant edema. She reports some LE edema at home. She thinks Lasix is not working. Will stop Lasix and start Torsemide 20 mg po BID  Disposition: Follow up with me in 12 months  Verne CarrowChristopher Gracelin Weisberg 01/28/2022 10:27 AM

## 2022-01-28 NOTE — Patient Instructions (Addendum)
Medication Instructions:  Your physician has recommended you make the following change in your medication:  1.) Stop aspirin 2.) Stop furosemide  3.) Start torsemide (Demadex) 20 mg tablet - take one tablet twice a day  *If you need a refill on your cardiac medications before your next appointment, please call your pharmacy*   Lab Work: NONE If you have labs (blood work) drawn today and your tests are completely normal, you will receive your results only by: MyChart Message (if you have MyChart) OR A paper copy in the mail If you have any lab test that is abnormal or we need to change your treatment, we will call you to review the results.   Testing/Procedures: NONE   Follow-Up: At [facility], you and your health needs are our priority.  As part of our continuing mission to provide you with exceptional heart care, we have created designated Provider Care Teams.  These Care Teams include your primary Cardiologist (physician) and Advanced Practice Providers (APPs -  Physician Assistants and Nurse Practitioners) who all work together to provide you with the care you need, when you need it.  Your next appointment:   12 month(s)  The format for your next appointment:   In Person  Provider:   Verne Carrow, MD      Important Information About Sugar

## 2022-01-29 ENCOUNTER — Ambulatory Visit: Payer: Medicare Other | Admitting: Cardiovascular Disease

## 2022-02-25 ENCOUNTER — Other Ambulatory Visit: Payer: Self-pay | Admitting: Registered Nurse

## 2022-02-25 DIAGNOSIS — Z1231 Encounter for screening mammogram for malignant neoplasm of breast: Secondary | ICD-10-CM

## 2022-03-11 ENCOUNTER — Other Ambulatory Visit: Payer: Self-pay | Admitting: *Deleted

## 2022-03-11 DIAGNOSIS — I739 Peripheral vascular disease, unspecified: Secondary | ICD-10-CM

## 2022-03-11 DIAGNOSIS — I6523 Occlusion and stenosis of bilateral carotid arteries: Secondary | ICD-10-CM

## 2022-03-11 DIAGNOSIS — M25561 Pain in right knee: Secondary | ICD-10-CM

## 2022-03-24 NOTE — Progress Notes (Deleted)
HISTORY AND PHYSICAL     CC:  follow up. Requesting Provider:  Arthur Holms, NP  HPI: This is a 72 y.o. female who is here today for follow up with hx of bilateral carotid artery stenosis and mixed PAD/venous disease.  Pt was last seen on 12/31/2020.  At that time, she was not having any claudication, rest pain or non healing wounds. She was having swelling in the BLE.  She did not tolerate compression stockings and was not elevating her legs.    She has hx of chronic back pain, CAD, HLD and HTN.  She has hx of right popliteal DVT after Covid 19 in September 2020.  She has known PAD with hx of angiogram by Dr. Oneida Alar in 2017, which showed mild right SFA stenosis at the origin.   The pt returns today for ***  Pt *** any amaurosis fugax, speech difficulties, weakness, numbness, paralysis or clumsiness or facial droop.    Pt *** claudication, ***rest pain, or ***non healing wounds.    The pt is on a statin for cholesterol management.    The pt is not on an aspirin.    Other AC:  Plavix The pt is on BB, ARB, diuretic for hypertension.  The pt does not have diabetes. Tobacco hx:  former  Pt does *** have family hx of AAA.    Past Medical History:  Diagnosis Date   Anxiety    Arthritis    "legs" (01/07/2016)   Back pain    Back pain    CAD 06/2009   a.  s/p MI 4/11: tx with DES x 2 to RCA;  b.  LHC 03/17/11: LAD 30-40%, distal LAD 30-40%, OM2 40-50%, RCA stent patent, EF greater than 70%.;  c.  Lexiscan Myoview (12/15):  Mild apical thinning, no ischemia, EF 61%; normal study   CAROTID STENOSIS    carotid Dopplers 09/16/51: RICA 66-44%; LICA 0-34%.  Follow up recommended in 6 months.   Constipation    Constipation    Depression    Gout    "on RX for it" (01/07/2016)   Heart disease    History of heart attack    Hx of blood clots    HYPERLIPIDEMIA    HYPERTENSION    HYPOTHYROIDISM    post ablation tx Graves   Hypothyroidism    Joint pain    Myocardial infarction [facility]) 2012    "mini"   OSA on CPAP    Osteoarthritis    Pre-diabetes    PVD    Rheumatoid arthritis ([place])    Sleep apnea    SOBOE (shortness of breath on exertion)    TOBACCO ABUSE quit 06/2009   Varicose veins    VITAMIN D DEFICIENCY    DEXA 04/2009 normal    Past Surgical History:  Procedure Laterality Date   CARDIAC CATHETERIZATION N/A 01/07/2016   Procedure: Left Heart Cath and Coronary Angiography;  Surgeon: Charolette Forward, MD;  Location: [place] CV LAB;  Service: Cardiovascular;  Laterality: N/A;   CARDIAC CATHETERIZATION N/A 01/07/2016   Procedure: Coronary Stent Intervention;  Surgeon: Charolette Forward, MD;  Location: [place] CV LAB;  Service: Cardiovascular;  Laterality: N/A;   CAROTID STENT Left    bilateral carotid artery disease post stent of left carotid   CESAREAN SECTION  1985   "twins"   CORONARY ANGIOPLASTY     LEFT HEART CATHETERIZATION WITH CORONARY ANGIOGRAM N/A 03/17/2011   Procedure: LEFT HEART CATHETERIZATION WITH CORONARY ANGIOGRAM;  Surgeon: Marcello Moores  Cheri Kearns, MD;  Location: [facility] CATH LAB;  Service: Cardiovascular;  Laterality: N/A;   PERIPHERAL VASCULAR CATHETERIZATION N/A 04/06/2015   Procedure: Abdominal Aortogram;  Surgeon: Sherren Kerns, MD;  Location: [facility] INVASIVE CV LAB;  Service: Cardiovascular;  Laterality: N/A;   RADIOACTIVE PLAQUE INSERTION     Graves disease post radioactive treatment complicated hypothyroidism   TOTAL HIP ARTHROPLASTY Left 01/22/2021   Procedure: LEFT TOTAL HIP ARTHROPLASTY ANTERIOR APPROACH;  Surgeon: Marcene Corning, MD;  Location: [facility] ORS;  Service: Orthopedics;  Laterality: Left;    Allergies  Allergen Reactions   Amlodipine Swelling   Lisinopril Cough   Statins Other (See Comments)    Myalgia    Current Outpatient Medications  Medication Sig Dispense Refill   allopurinol (ZYLOPRIM) 300 MG tablet Take 300 mg by mouth daily.     carvedilol (COREG) 6.25 MG tablet Take 1 tablet (6.25 mg total) by mouth 2 (two) times daily with a  meal. 180 tablet 3   Cholecalciferol (VITAMIN D) 50 MCG (2000 UT) tablet Take 1 tablet (2,000 Units total) by mouth daily.     clopidogrel (PLAVIX) 75 MG tablet Take 75 mg by mouth daily.     colchicine 0.6 MG tablet Take 1.2 mg by mouth daily as needed (Gout flare ups).     ezetimibe (ZETIA) 10 MG tablet Take 1 tablet (10 mg total) by mouth daily. 90 tablet 3   levothyroxine (SYNTHROID, LEVOTHROID) 125 MCG tablet Take 125 mcg by mouth daily before breakfast.     LINZESS 145 MCG CAPS capsule Take 1 capsule (145 mcg total) by mouth daily. 30 capsule 11   losartan (COZAAR) 100 MG tablet Take 1 tablet (100 mg total) by mouth daily. 90 tablet 3   nitroGLYCERIN (NITROSTAT) 0.4 MG SL tablet Place 1 tablet (0.4 mg total) under the tongue every 5 (five) minutes as needed for chest pain. 25 tablet 3   OZEMPIC, 1 MG/DOSE, 4 MG/3ML SOPN INJECT 1 MG WEEKLY (Patient not taking: Reported on 10/24/2021) 3 mL 1   potassium chloride (KLOR-CON) 10 MEQ tablet Take 1 tablet (10 mEq total) by mouth daily as needed. Taking 1 tablet when taking Lasix (Patient taking differently: Take 10 mEq by mouth daily.) 30 tablet 0   rosuvastatin (CRESTOR) 40 MG tablet Take 20 mg by mouth at bedtime.     Selenium 200 MCG CAPS Take 200 mcg by mouth daily.     spironolactone (ALDACTONE) 25 MG tablet Take 25 mg by mouth daily.     tiZANidine (ZANAFLEX) 4 MG tablet Take 4 mg by mouth every 6 (six) hours as needed.     torsemide (DEMADEX) 20 MG tablet Take 1 tablet (20 mg total) by mouth 2 (two) times daily. 180 tablet 3   Vitamin D, Ergocalciferol, (DRISDOL) 1.25 MG (50000 UNIT) CAPS capsule 1 PO Q 5 DAYS (Patient taking differently: Take 50,000 Units by mouth every 7 (seven) days.) 6 capsule 0   zinc gluconate 50 MG tablet Take 50 mg by mouth daily.     No current facility-administered medications for this visit.    Family History  Problem Relation Age of Onset   Heart attack Mother    Stroke Mother    Cancer Mother    Varicose  Veins Mother    Thyroid disease Mother    Hypertension Mother    Heart disease Mother    Sudden death Mother    Depression Mother    Cancer Father    Heart disease Father  Hypertension Father    Coronary artery disease Other    Hypertension Other    Hypertension Sister    Breast cancer Paternal Aunt     Social History   Socioeconomic History   Marital status: Divorced    Spouse name: Not on file   Number of children: Not on file   Years of education: Not on file   Highest education level: Not on file  Occupational History   Occupation: retired, Comptroller for mental health  Tobacco Use   Smoking status: Former    Packs/day: 1.00    Years: 35.00    Total pack years: 35.00    Types: Cigarettes    Quit date: 06/30/2009    Years since quitting: 12.7   Smokeless tobacco: Never  Vaping Use   Vaping Use: Never used  Substance and Sexual Activity   Alcohol use: Yes    Alcohol/week: 2.0 standard drinks of alcohol    Types: 2 Glasses of wine per week    Comment: occ   Drug use: No   Sexual activity: Not Currently    Comment: Quit smoking after MI- prev 1ppd x 35ys  Other Topics Concern   Not on file  Social History Narrative   Not on file   Social Determinants of Health   Financial Resource Strain: Not on file  Food Insecurity: Not on file  Transportation Needs: Not on file  Physical Activity: Not on file  Stress: Not on file  Social Connections: Not on file  Intimate Partner Violence: Not on file     REVIEW OF SYSTEMS:  *** [X]  denotes positive finding, [ ]  denotes negative finding Cardiac  Comments:  Chest pain or chest pressure:    Shortness of breath upon exertion:    Short of breath when lying flat:    Irregular heart rhythm:        Vascular    Pain in calf, thigh, or hip brought on by ambulation:    Pain in feet at night that wakes you up from your sleep:     Blood clot in your veins:    Leg swelling:         Pulmonary    Oxygen at home:     Wheezing:         Neurologic    Sudden weakness in arms or legs:     Sudden numbness in arms or legs:     Sudden onset of difficulty speaking or understanding others    Temporary loss of vision in one eye:     Problems with dizziness:         Gastrointestinal    Blood in stool:     Vomited blood:         Genitourinary    Burning when urinating:     Blood in urine:        Psychiatric    Major depression:         Hematologic    Bleeding problems:    Problems with blood clotting too easily:        Skin    Rashes or ulcers:        Constitutional    Fever or chills:      PHYSICAL EXAMINATION:  ***  General:  WDWN in NAD; vital signs documented above Gait: Not observed HENT: WNL, normocephalic Pulmonary: normal non-labored breathing  Cardiac: {Desc; regular/irreg:14544} HR;  {With/Without:20273} carotid bruit*** Abdomen: soft, NT, aortic pulse is *** palpable Skin: {With/Without:20273} rashes Vascular  Exam/Pulses:  Right Left  Radial {Exam; arterial pulse strength 0-4:30167} {Exam; arterial pulse strength 0-4:30167}  Femoral {Exam; arterial pulse strength 0-4:30167} {Exam; arterial pulse strength 0-4:30167}  Popliteal {Exam; arterial pulse strength 0-4:30167} {Exam; arterial pulse strength 0-4:30167}  DP {Exam; arterial pulse strength 0-4:30167} {Exam; arterial pulse strength 0-4:30167}  PT {Exam; arterial pulse strength 0-4:30167} {Exam; arterial pulse strength 0-4:30167}  Peroneal *** ***   Extremities: *** Musculoskeletal: no muscle wasting or atrophy  Neurologic: A&O X 3;  speech is fluent/normal; moving all extremities equally *** Psychiatric:  The pt has {Desc; normal/abnormal:11317::"Normal"} affect.   Non-Invasive Vascular Imaging:   ABI's/TBI's on 03/25/2022: Right:  *** - great toe pressure:  *** Left:  *** - great toe pressure:  ***   Carotid Duplex on 03/25/2022: Right:  ***% ICA stenosis Left:  ***% ICA stenosis  Previous ABI's/TBI's on  06/24/2021: Right:  0.57/0.36 - great toe pressure:  56 Left:  0.93/0.54 - great toe pressure:  84  Previous Carotid duplex on 06/24/2021: Right: 40-59% ICA stenosis Left:   1-39% ICA stenosis ***   ASSESSMENT/PLAN:: 72 y.o. female here for follow up for PAD and carotid stenosis with hx of ***   PAD -*** -pt does *** have rest pain, ***claudication, ***non healing wounds. -continue graduated walking program -pt will f/u in *** with ***  Carotid stenosis -duplex today reveals *** -discussed s/s of stroke with pt and ***he understands should ***he develop any of these sx, ***he will go to the nearest ER or call 911. -pt will f/u in *** with ***  -continue statin/asa ***   Doreatha Massed, [facility] [phone]  Clinic MD:   Chestine Spore

## 2022-03-25 ENCOUNTER — Ambulatory Visit: Payer: Medicare Other

## 2022-03-25 ENCOUNTER — Ambulatory Visit (HOSPITAL_COMMUNITY): Payer: Medicare Other

## 2022-04-03 ENCOUNTER — Other Ambulatory Visit: Payer: Self-pay | Admitting: Family Medicine

## 2022-04-03 DIAGNOSIS — K5909 Other constipation: Secondary | ICD-10-CM

## 2022-04-09 NOTE — Progress Notes (Unsigned)
HISTORY AND PHYSICAL     CC:  follow up. Requesting Provider:  Loura Back, NP  HPI: This is a 72 y.o. female who is here today for follow up and was pt of Dr. Darrick Penna and has hx of carotid artery stenosis and PAD as well as venous insufficiency.  Pt was last seen on 12/31/2020.  At that time, she had a carotid duplex that revealed 60-79% right ICA stenosis.  Duplex since reveals 40-59% right ICA stenosis.  The left remained 1-39%. Her ABI's were stable.  She was not having any neurological sx.  She was not having any claudication, rest pain or non healing wounds.  She was scheduled for hip replacement.  Echo in August 2023 revealed EF of 60-65%.  Pt also has hx of CAD with hx of cardiac stents, hyperlipidemia, hypothyroidism, sleep apnea on CPAP, Graves disease s/p thyroid ablation, COPD, chronic diastolic CHF, and HTN .  The pt returns today for follow up.    Pt denies any amaurosis fugax, speech difficulties, weakness, numbness, paralysis or clumsiness or facial droop.    She does not have any arm fatigue or dizziness.    Pt denies claudication, rest pain, or non healing wounds.   She states that she has some cramping in her legs when she stands but not necessarily all the time with walking.    The pt is on a statin for cholesterol management.    The pt is not on an aspirin.    Other AC:  Plavix The pt is on BB, ARB, diuretic for hypertension.  The pt does not have diabetes. Tobacco hx:  former  Pt does not have family hx of AAA.    Past Medical History:  Diagnosis Date   Anxiety    Arthritis    "legs" (01/07/2016)   Back pain    Back pain    CAD 06/2009   a.  s/p MI 4/11: tx with DES x 2 to RCA;  b.  LHC 03/17/11: LAD 30-40%, distal LAD 30-40%, OM2 40-50%, RCA stent patent, EF greater than 70%.;  c.  Lexiscan Myoview (12/15):  Mild apical thinning, no ischemia, EF 61%; normal study   CAROTID STENOSIS    carotid Dopplers 03/25/11: RICA 60-79%; LICA 0-39%.  Follow up recommended  in 6 months.   Constipation    Constipation    Depression    Gout    "on RX for it" (01/07/2016)   Heart disease    History of heart attack    Hx of blood clots    HYPERLIPIDEMIA    HYPERTENSION    HYPOTHYROIDISM    post ablation tx Graves   Hypothyroidism    Joint pain    Myocardial infarction [facility]) 2012   "mini"   OSA on CPAP    Osteoarthritis    Pre-diabetes    PVD    Rheumatoid arthritis (HCC)    Sleep apnea    SOBOE (shortness of breath on exertion)    TOBACCO ABUSE quit 06/2009   Varicose veins    VITAMIN D DEFICIENCY    DEXA 04/2009 normal    Past Surgical History:  Procedure Laterality Date   CARDIAC CATHETERIZATION N/A 01/07/2016   Procedure: Left Heart Cath and Coronary Angiography;  Surgeon: Rinaldo Cloud, MD;  Location: [facility] INVASIVE CV LAB;  Service: Cardiovascular;  Laterality: N/A;   CARDIAC CATHETERIZATION N/A 01/07/2016   Procedure: Coronary Stent Intervention;  Surgeon: Rinaldo Cloud, MD;  Location: [facility] INVASIVE CV LAB;  Service:  Cardiovascular;  Laterality: N/A;   CAROTID STENT Left    bilateral carotid artery disease post stent of left carotid   CESAREAN SECTION  1985   "twins"   CORONARY ANGIOPLASTY     LEFT HEART CATHETERIZATION WITH CORONARY ANGIOGRAM N/A 03/17/2011   Procedure: LEFT HEART CATHETERIZATION WITH CORONARY ANGIOGRAM;  Surgeon: Hillary Bow, MD;  Location: [facility] CATH LAB;  Service: Cardiovascular;  Laterality: N/A;   PERIPHERAL VASCULAR CATHETERIZATION N/A 04/06/2015   Procedure: Abdominal Aortogram;  Surgeon: Elam Dutch, MD;  Location: [place] [facility] CV LAB;  Service: Cardiovascular;  Laterality: N/A;   RADIOACTIVE PLAQUE INSERTION     Graves disease post radioactive treatment complicated hypothyroidism   TOTAL HIP ARTHROPLASTY Left 01/22/2021   Procedure: LEFT TOTAL HIP ARTHROPLASTY ANTERIOR APPROACH;  Surgeon: Melrose Nakayama, MD;  Location: [facility] ORS;  Service: Orthopedics;  Laterality: Left;    Allergies  Allergen Reactions    Amlodipine Swelling   Lisinopril Cough   Statins Other (See Comments)    Myalgia    Current Outpatient Medications  Medication Sig Dispense Refill   allopurinol (ZYLOPRIM) 300 MG tablet Take 300 mg by mouth daily.     carvedilol (COREG) 6.25 MG tablet Take 1 tablet (6.25 mg total) by mouth 2 (two) times daily with a meal. 180 tablet 3   Cholecalciferol (VITAMIN D) 50 MCG (2000 UT) tablet Take 1 tablet (2,000 Units total) by mouth daily.     clopidogrel (PLAVIX) 75 MG tablet Take 75 mg by mouth daily.     colchicine 0.6 MG tablet Take 1.2 mg by mouth daily as needed (Gout flare ups).     ezetimibe (ZETIA) 10 MG tablet Take 1 tablet (10 mg total) by mouth daily. 90 tablet 3   levothyroxine (SYNTHROID, LEVOTHROID) 125 MCG tablet Take 125 mcg by mouth daily before breakfast.     LINZESS 145 MCG CAPS capsule Take 1 capsule (145 mcg total) by mouth daily. 30 capsule 11   losartan (COZAAR) 100 MG tablet Take 1 tablet (100 mg total) by mouth daily. 90 tablet 3   nitroGLYCERIN (NITROSTAT) 0.4 MG SL tablet Place 1 tablet (0.4 mg total) under the tongue every 5 (five) minutes as needed for chest pain. 25 tablet 3   OZEMPIC, 1 MG/DOSE, 4 MG/3ML SOPN INJECT 1 MG WEEKLY (Patient not taking: Reported on 10/24/2021) 3 mL 1   potassium chloride (KLOR-CON) 10 MEQ tablet Take 1 tablet (10 mEq total) by mouth daily as needed. Taking 1 tablet when taking Lasix (Patient taking differently: Take 10 mEq by mouth daily.) 30 tablet 0   rosuvastatin (CRESTOR) 40 MG tablet Take 20 mg by mouth at bedtime.     Selenium 200 MCG CAPS Take 200 mcg by mouth daily.     spironolactone (ALDACTONE) 25 MG tablet Take 25 mg by mouth daily.     tiZANidine (ZANAFLEX) 4 MG tablet Take 4 mg by mouth every 6 (six) hours as needed.     torsemide (DEMADEX) 20 MG tablet Take 1 tablet (20 mg total) by mouth 2 (two) times daily. 180 tablet 3   Vitamin D, Ergocalciferol, (DRISDOL) 1.25 MG (50000 UNIT) CAPS capsule 1 PO Q 5 DAYS (Patient  taking differently: Take 50,000 Units by mouth every 7 (seven) days.) 6 capsule 0   zinc gluconate 50 MG tablet Take 50 mg by mouth daily.     No current facility-administered medications for this visit.    Family History  Problem Relation Age of Onset  Heart attack Mother    Stroke Mother    Cancer Mother    Varicose Veins Mother    Thyroid disease Mother    Hypertension Mother    Heart disease Mother    Sudden death Mother    Depression Mother    Cancer Father    Heart disease Father    Hypertension Father    Coronary artery disease Other    Hypertension Other    Hypertension Sister    Breast cancer Paternal Aunt     Social History   Socioeconomic History   Marital status: Divorced    Spouse name: Not on file   Number of children: Not on file   Years of education: Not on file   Highest education level: Not on file  Occupational History   Occupation: retired, Comptroller for mental health  Tobacco Use   Smoking status: Former    Packs/day: 1.00    Years: 35.00    Total pack years: 35.00    Types: Cigarettes    Quit date: 06/30/2009    Years since quitting: 12.7   Smokeless tobacco: Never  Vaping Use   Vaping Use: Never used  Substance and Sexual Activity   Alcohol use: Yes    Alcohol/week: 2.0 standard drinks of alcohol    Types: 2 Glasses of wine per week    Comment: occ   Drug use: No   Sexual activity: Not Currently    Comment: Quit smoking after MI- prev 1ppd x 35ys  Other Topics Concern   Not on file  Social History Narrative   Not on file   Social Determinants of Health   Financial Resource Strain: Not on file  Food Insecurity: Not on file  Transportation Needs: Not on file  Physical Activity: Not on file  Stress: Not on file  Social Connections: Not on file  Intimate Partner Violence: Not on file     REVIEW OF SYSTEMS:   [X]  denotes positive finding, [ ]  denotes negative finding Cardiac  Comments:  Chest pain or chest pressure:     Shortness of breath upon exertion:    Short of breath when lying flat:    Irregular heart rhythm:        Vascular    Pain in calf, thigh, or hip brought on by ambulation: x See HPI  Pain in feet at night that wakes you up from your sleep:     Blood clot in your veins:    Leg swelling:         Pulmonary    Oxygen at home:    Wheezing:         Neurologic    Sudden weakness in arms or legs:     Sudden numbness in arms or legs:     Sudden onset of difficulty speaking or understanding others    Temporary loss of vision in one eye:     Problems with dizziness:         Gastrointestinal    Blood in stool:     Vomited blood:         Genitourinary    Burning when urinating:     Blood in urine:        Psychiatric    Major depression:         Hematologic    Bleeding problems:    Problems with blood clotting too easily:        Skin    Rashes or ulcers:  Constitutional    Fever or chills:      PHYSICAL EXAMINATION:  Today's Vitals   04/10/22 1102  BP: 108/75  Pulse: 60  Temp: 98 F (36.7 C)  TempSrc: Temporal  SpO2: 98%   There is no height or weight on file to calculate BMI.   General:  WDWN in NAD; vital signs documented above Gait: Not observed HENT: WNL, normocephalic Pulmonary: normal non-labored breathing  Cardiac: regular HR;  with carotid bruit on the left Abdomen: soft, NT, aortic pulse is not palpable Skin: without rashes Vascular Exam/Pulses:  Right Left  Radial 2+ (normal) 2+ (normal)  DP absent Brisk monophasic  PT monophasic Brisk monophasic  Peroneal monophasic Brisk monophasic   Extremities: bilateral feet are warm without non healing wounds Musculoskeletal: no muscle wasting or atrophy  Neurologic: A&O X 3;  speech is fluent/normal; moving all extremities equally  Psychiatric:  The pt has Normal affect.   Non-Invasive Vascular Imaging:   ABI's/TBI's on 04/10/2022: Right:  0.57/0.33 - great toe pressure:  41 Left:  0.85/0.34 -  great toe pressure:  42  Non-Invasive Vascular Imaging:   Carotid Duplex on 04/10/2022: Right:  40-59% ICA stenosis Left:  1-39% ICA stenosis Vertebrals:  Left vertebral artery demonstrates antegrade flow. Right vertebral  artery demonstrates retrograde flow with high grade stenosis at the origin.  Subclavians: Right subclavian artery was stenotic. Normal flow hemodynamics were seen in the left subclavian artery.   Previous ABI's/TBI's on 06/24/2021: Right:  0.57/0.36 - great toe pressure:  56 Left:  0.93/0.54 - great toe pressure:  84  Previous Carotid duplex on 06/24/2021: Right: 40-59% ICA stenosis Left:   1-39% ICA stenosis    ASSESSMENT/PLAN:: 72 y.o. female here for follow up for PAD and carotid stenosis with hx of carotid artery stenosis and PAD as well as venous insufficiency.   PAD -pt ABI essentially unchanged on the right and slightly decreased from last visit on the left.   -pt does not have rest pain, claudication, non healing wounds. -continue graduated walking program -pt will f/u in one year with ABI-she knows to call sooner if she develops rest pain or non healing wounds  Carotid stenosis -duplex today reveals 40-59% right and 1-39% left ICA stenosis.  She did have a right vertebral and subclavian stenosis but is asymptomatic.  She has palpable bilateral radial pulses.   -discussed s/s of stroke with pt and she understands should she develop any of these sx, she will go to the nearest ER or call 911. -pt will f/u in one year with carotid duplex  -continue statin/plavix   Leontine Locket, [facility] [phone]  Clinic MD:   Scot Dock

## 2022-04-10 ENCOUNTER — Ambulatory Visit (INDEPENDENT_AMBULATORY_CARE_PROVIDER_SITE_OTHER): Payer: 59 | Admitting: Physician Assistant

## 2022-04-10 ENCOUNTER — Ambulatory Visit (INDEPENDENT_AMBULATORY_CARE_PROVIDER_SITE_OTHER)
Admission: RE | Admit: 2022-04-10 | Discharge: 2022-04-10 | Disposition: A | Discharge status: Home / Self Care | Payer: 59 | Source: Ambulatory Visit

## 2022-04-10 ENCOUNTER — Ambulatory Visit (HOSPITAL_COMMUNITY)
Admission: RE | Admit: 2022-04-10 | Discharge: 2022-04-10 | Disposition: A | Discharge status: Home / Self Care | Payer: 59 | Source: Ambulatory Visit | Attending: Surgery | Admitting: Surgery

## 2022-04-10 VITALS — BP 108/75 | HR 60 | Temp 98.0°F

## 2022-04-10 DIAGNOSIS — I739 Peripheral vascular disease, unspecified: Secondary | ICD-10-CM

## 2022-04-10 DIAGNOSIS — I6523 Occlusion and stenosis of bilateral carotid arteries: Secondary | ICD-10-CM

## 2022-04-10 DIAGNOSIS — M25561 Pain in right knee: Secondary | ICD-10-CM

## 2022-04-10 DIAGNOSIS — M25562 Pain in left knee: Secondary | ICD-10-CM | POA: Diagnosis not present

## 2022-04-10 LAB — VAS US ABI WITH/WO TBI
Left ABI: 0.85
Right ABI: 0.57

## 2022-04-21 ENCOUNTER — Ambulatory Visit
Admission: RE | Admit: 2022-04-21 | Discharge: 2022-04-21 | Disposition: A | Discharge status: Home / Self Care | Payer: 59 | Source: Ambulatory Visit | Attending: Registered Nurse | Admitting: Registered Nurse

## 2022-04-21 ENCOUNTER — Ambulatory Visit: Payer: Medicare Other

## 2022-04-21 DIAGNOSIS — Z1231 Encounter for screening mammogram for malignant neoplasm of breast: Secondary | ICD-10-CM

## 2022-05-08 ENCOUNTER — Other Ambulatory Visit: Payer: Self-pay | Admitting: Cardiovascular Disease

## 2022-07-22 ENCOUNTER — Telehealth: Payer: Self-pay | Admitting: Cardiovascular Disease

## 2022-07-22 NOTE — Telephone Encounter (Signed)
Patient reports on and off chest heaviness for the past couple of weeks. She states that she will have a sharp/teogh sensation every once in a while. She cannot associate her symptoms with any exertion and states that it comes on randomly and will eventually resolve on its own. She denies any shortness of breath. She reports she has been fatigued. She is not having any current chest pain or symptoms. She has not taken any nitroglycerin for symptoms. She is aware of ER precautions and has been scheduled for an office visit on Friday.

## 2022-07-22 NOTE — Telephone Encounter (Signed)
Pt c/o of Chest Pain: STAT if CP now or developed within 24 hours  1. Are you having CP right now?   Yes  2. Are you experiencing any other symptoms (ex. SOB, nausea, vomiting, sweating)?   No  3. How long have you been experiencing CP?  Started a couple of weeks ago  4. Is your CP continuous or coming and going?   Comes and goes  5. Have you taken Nitroglycerin?   No  Patient stated she is feeling chest heaviness and wants to have an EKG. ?

## 2022-07-25 ENCOUNTER — Ambulatory Visit: Payer: 59 | Attending: Cardiovascular Disease | Admitting: Cardiovascular Disease

## 2022-07-25 ENCOUNTER — Encounter: Payer: Self-pay | Admitting: Cardiovascular Disease

## 2022-07-25 ENCOUNTER — Encounter: Payer: Self-pay | Admitting: *Deleted

## 2022-07-25 VITALS — BP 118/60 | HR 56 | Ht 65.0 in | Wt 237.4 lb

## 2022-07-25 DIAGNOSIS — R5383 Other fatigue: Secondary | ICD-10-CM

## 2022-07-25 DIAGNOSIS — I6523 Occlusion and stenosis of bilateral carotid arteries: Secondary | ICD-10-CM

## 2022-07-25 DIAGNOSIS — E782 Mixed hyperlipidemia: Secondary | ICD-10-CM

## 2022-07-25 DIAGNOSIS — I5032 Chronic diastolic (congestive) heart failure: Secondary | ICD-10-CM | POA: Diagnosis not present

## 2022-07-25 DIAGNOSIS — I10 Essential (primary) hypertension: Secondary | ICD-10-CM

## 2022-07-25 DIAGNOSIS — I25118 Atherosclerotic heart disease of native coronary artery with other forms of angina pectoris: Secondary | ICD-10-CM | POA: Diagnosis not present

## 2022-07-25 NOTE — Progress Notes (Signed)
Chief Complaint  Patient presents with   Follow-up    Chest pain   History of Present Illness: 72 yo female with history of CAD, hyperlipidemia, hypothyroidism, sleep apnea on CPAP, Graves disease s/p thyroid ablation, PAD, COPD, chronic diastolic CHF, carotid artery disease and HTN here for cardiac follow up. She had an MI in April 2011 and had 2 drug eluting stents placed in the RCA. She had chest pains leading to a stress test and cardiac cath in December 2012. Cardiac cath December 2012 with stable CAD. She began following with Dr. Sharyn Lull for a number of years. Cardiac cath in 2017 with severe RCA stenosis treated with a drug eluting stent. Mild disease in the LAD and Circumflex. She was seen by Dr. Kirke Corin in October 2022 for pre-operative assessment and reported dyspnea but no chest pain. Nuclear stress test November 2022 with no evidence of ischemia. Echo August 2023 with LVEF=60-65%. Mild LVH. Grade 1 diastolic dysfunction. Trivial MR. Carotid artery dopplers 04/10/22 with 40-59% RICA stenosis, 1-39% left ICA stenosis. Severe right vertebral artery stenosis. Carotid disease followed in [facility].   She is here today for follow up. She called our office this week and reported fatigue, occasional chest pressure. Her chest pressure is not the same as the pain she had before her stents were placed. chest pain. Her energy level is down. She was told in primary care that her kidney function was not normal. I cannot see these labs today. She was told to cut her Torsemide in half to 20 mg per day and told to stop aldactone. Her losartan dose was not adjusted. She denies dyspnea, palpitations, lower extremity edema, orthopnea, PND, dizziness, near syncope or syncope.   Primary Care Provider: Loura Back, NP  Past Medical History:  Diagnosis Date   Anxiety    Arthritis    "legs" (01/07/2016)   Back pain    Back pain    CAD 06/2009   a.  s/p MI 4/11: tx with DES x 2 to RCA;  b.  LHC 03/17/11: LAD  30-40%, distal LAD 30-40%, OM2 40-50%, RCA stent patent, EF greater than 70%.;  c.  Lexiscan Myoview (12/15):  Mild apical thinning, no ischemia, EF 61%; normal study   CAROTID STENOSIS    carotid Dopplers 03/25/11: RICA 60-79%; LICA 0-39%.  Follow up recommended in 6 months.   Constipation    Constipation    COPD mixed type (HCC)    Depression    Gout    "on RX for it" (01/07/2016)   Heart disease    History of heart attack    Hx of blood clots    HYPERLIPIDEMIA    HYPERTENSION    HYPOTHYROIDISM    post ablation tx Graves   Hypothyroidism    Joint pain    Myocardial infarction [facility]) 2012   "mini"   OSA on CPAP    Osteoarthritis    Pre-diabetes    PVD    Rheumatoid arthritis (HCC)    Sleep apnea    SOBOE (shortness of breath on exertion)    TOBACCO ABUSE quit 06/2009   Varicose veins    VITAMIN D DEFICIENCY    DEXA 04/2009 normal    Past Surgical History:  Procedure Laterality Date   CARDIAC CATHETERIZATION N/A 01/07/2016   Procedure: Left Heart Cath and Coronary Angiography;  Surgeon: Rinaldo Cloud, MD;  Location: [facility] INVASIVE CV LAB;  Service: Cardiovascular;  Laterality: N/A;   CARDIAC CATHETERIZATION N/A 01/07/2016  Procedure: Coronary Stent Intervention;  Surgeon: Rinaldo Cloud, MD;  Location: [facility] INVASIVE CV LAB;  Service: Cardiovascular;  Laterality: N/A;   CAROTID STENT Left    bilateral carotid artery disease post stent of left carotid   CESAREAN SECTION  1985   "twins"   CORONARY ANGIOPLASTY     LEFT HEART CATHETERIZATION WITH CORONARY ANGIOGRAM N/A 03/17/2011   Procedure: LEFT HEART CATHETERIZATION WITH CORONARY ANGIOGRAM;  Surgeon: Herby Abraham, MD;  Location: [facility] CATH LAB;  Service: Cardiovascular;  Laterality: N/A;   PERIPHERAL VASCULAR CATHETERIZATION N/A 04/06/2015   Procedure: Abdominal Aortogram;  Surgeon: Sherren Kerns, MD;  Location: [facility] INVASIVE CV LAB;  Service: Cardiovascular;  Laterality: N/A;   RADIOACTIVE PLAQUE INSERTION     Graves disease  post radioactive treatment complicated hypothyroidism   TOTAL HIP ARTHROPLASTY Left 01/22/2021   Procedure: LEFT TOTAL HIP ARTHROPLASTY ANTERIOR APPROACH;  Surgeon: Marcene Corning, MD;  Location: [facility] ORS;  Service: Orthopedics;  Laterality: Left;    Current Outpatient Medications  Medication Sig Dispense Refill   allopurinol (ZYLOPRIM) 300 MG tablet Take 300 mg by mouth daily.     carvedilol (COREG) 6.25 MG tablet Take 1 tablet (6.25 mg total) by mouth 2 (two) times daily with a meal. 180 tablet 3   Cholecalciferol (VITAMIN D) 50 MCG (2000 UT) tablet Take 1 tablet (2,000 Units total) by mouth daily.     clopidogrel (PLAVIX) 75 MG tablet Take 75 mg by mouth daily.     colchicine 0.6 MG tablet Take 1.2 mg by mouth daily as needed (Gout flare ups).     ezetimibe (ZETIA) 10 MG tablet TAKE 1 TABLET BY MOUTH EVERY DAY 90 tablet 3   levothyroxine (SYNTHROID, LEVOTHROID) 125 MCG tablet Take 125 mcg by mouth daily before breakfast.     LINZESS 145 MCG CAPS capsule Take 1 capsule (145 mcg total) by mouth daily. 30 capsule 11   losartan (COZAAR) 100 MG tablet Take 1 tablet (100 mg total) by mouth daily. 90 tablet 3   nitroGLYCERIN (NITROSTAT) 0.4 MG SL tablet Place 1 tablet (0.4 mg total) under the tongue every 5 (five) minutes as needed for chest pain. 25 tablet 3   potassium chloride (KLOR-CON) 10 MEQ tablet Take 1 tablet (10 mEq total) by mouth daily as needed. Taking 1 tablet when taking Lasix (Patient taking differently: Take 10 mEq by mouth daily.) 30 tablet 0   rosuvastatin (CRESTOR) 40 MG tablet Take 20 mg by mouth at bedtime.     Selenium 200 MCG CAPS Take 200 mcg by mouth daily.     torsemide (DEMADEX) 20 MG tablet Take 20 mg by mouth daily.     Vitamin D, Ergocalciferol, (DRISDOL) 1.25 MG (50000 UNIT) CAPS capsule 1 PO Q 5 DAYS (Patient taking differently: Take 50,000 Units by mouth every 7 (seven) days.) 6 capsule 0   zinc gluconate 50 MG tablet Take 50 mg by mouth daily.     OZEMPIC, 1  MG/DOSE, 4 MG/3ML SOPN INJECT 1 MG WEEKLY (Patient not taking: Reported on 10/24/2021) 3 mL 1   No current facility-administered medications for this visit.    Allergies  Allergen Reactions   Amlodipine Swelling   Lisinopril Cough   Statins Other (See Comments)    Myalgia    Social History   Socioeconomic History   Marital status: Divorced    Spouse name: Not on file   Number of children: Not on file   Years of education: Not on file  Highest education level: Not on file  Occupational History   Occupation: retired, Comptroller for mental health  Tobacco Use   Smoking status: Former    Packs/day: 1.00    Years: 35.00    Additional pack years: 0.00    Total pack years: 35.00    Types: Cigarettes    Quit date: 06/30/2009    Years since quitting: 13.0   Smokeless tobacco: Never  Vaping Use   Vaping Use: Never used  Substance and Sexual Activity   Alcohol use: Yes    Alcohol/week: 2.0 standard drinks of alcohol    Types: 2 Glasses of wine per week    Comment: occ   Drug use: No   Sexual activity: Not Currently    Comment: Quit smoking after MI- prev 1ppd x 35ys  Other Topics Concern   Not on file  Social History Narrative   Not on file   Social Determinants of Health   Financial Resource Strain: Not on file  Food Insecurity: Not on file  Transportation Needs: Not on file  Physical Activity: Not on file  Stress: Not on file  Social Connections: Not on file  Intimate Partner Violence: Not on file    Family History  Problem Relation Age of Onset   Heart attack Mother    Stroke Mother    Cancer Mother    Varicose Veins Mother    Thyroid disease Mother    Hypertension Mother    Heart disease Mother    Sudden death Mother    Depression Mother    Cancer Father    Heart disease Father    Hypertension Father    Coronary artery disease Other    Hypertension Other    Hypertension Sister    Breast cancer Paternal Aunt     Review of Systems:  As stated in the  HPI and otherwise negative.   BP 118/60   Pulse (!) 56   Ht 5\' 5"  (1.651 m)   Wt 107.7 kg   SpO2 98%   BMI 39.51 kg/m   Physical Examination: General: Well developed, well nourished, NAD  HEENT: OP clear, mucus membranes moist  SKIN: warm, dry. No rashes. Neuro: No focal deficits  Musculoskeletal: Muscle strength 5/5 all ext  Psychiatric: Mood and affect normal  Neck: No JVD, no carotid bruits, no thyromegaly, no lymphadenopathy.  Lungs:Clear bilaterally, no wheezes, rhonci, crackles Cardiovascular: Regular rate and rhythm. No murmurs, gallops or rubs. Abdomen:Soft. Bowel sounds present. Non-tender.  Extremities: No lower extremity edema. Pulses are 2 + in the bilateral DP/PT.  EKG is  performed today The EKG demonstrates sinus brady, rate 56 bpm  Echo 11/07/21:  1. Left ventricular ejection fraction, by estimation, is 60 to 65%. The  left ventricle has normal function. The left ventricle has no regional  wall motion abnormalities. There is mild left ventricular hypertrophy.  Left ventricular diastolic parameters  are consistent with Grade I diastolic dysfunction (impaired relaxation).   2. Right ventricular systolic function is normal. The right ventricular  size is normal.   3. Left atrial size was mildly dilated.   4. The mitral valve is normal in structure. Trivial mitral valve  regurgitation. No evidence of mitral stenosis.   5. The aortic valve is tricuspid. Aortic valve regurgitation is not  visualized. Aortic valve sclerosis/calcification is present, without any  evidence of aortic stenosis.    Lipid Panel     Component Value Date/Time   CHOL 142 11/05/2021 0846   TRIG  104 11/05/2021 0846   TRIG 114 02/04/2010 0000   HDL 56 11/05/2021 0846   CHOLHDL 2.5 11/05/2021 0846   CHOLHDL 4.4 06/29/2017 0840   VLDL 41 (H) 06/29/2017 0840   LDLCALC 67 11/05/2021 0846   LDLDIRECT 156.4 08/02/2012 1713   LABVLDL 19 11/05/2021 0846      Assessment and Plan:   1. CAD  with stable angina: Her pain is not her typical angina. The pain occurs mostly at rest. EKG does not show ischemic changes. Echo August 2023 with normal LV function, grade 1 diastolic dysfunction. Nuclear stress test in October 2022 with no evidence of ischemia.  I will repeat her nuclear stress test now to exclude ischemia. Will continue Plavix, beta blocker and statin.     2. Carotid artery disease: Followed by [facility]  3. HTN: BP is well controlled. She did not tolerate Norvasc. Continue current medications  4. HLD: LDL 67 in August 2023. Will continue Crestor and Zetia.    5. Chronic diastolic CHF: Weight is stable. No volume overload on exam. She was changed from Lasix to Torsemide at last visit. Continue Torsemide 20 mg daily. Will get records from primary care with recent BMET.   6. Fatigue: Her vitals are stable. Unclear why she has so much fatigue. Will get labs as above from primary care. Stress test to exclude ischemia.   Disposition: Follow up with me in 12 months  Verne Carrow, MD, [facility] 07/25/2022 4:24 PM

## 2022-07-25 NOTE — Patient Instructions (Signed)
Medication Instructions:  No changes  *If you need a refill on your cardiac medications before your next appointment, please call your pharmacy*   Lab Work: none   Testing/Procedures: Your physician has requested that you have a lexiscan myoview. For further information please visit [email or web address] Please follow instruction sheet, as given.   Follow-Up: As planned Nov/Dec 2024

## 2022-07-28 ENCOUNTER — Encounter (HOSPITAL_COMMUNITY): Payer: Self-pay | Admitting: *Deleted

## 2022-07-28 ENCOUNTER — Telehealth (HOSPITAL_COMMUNITY): Payer: Self-pay | Admitting: *Deleted

## 2022-07-28 NOTE — Telephone Encounter (Signed)
Left message on voicemail in reference to upcoming appointment scheduled for 07/30/22 Phone number given for a call back so details instructions can be given. Letter with instructions sent to my chart.  Caitlyn [name]

## 2022-07-30 ENCOUNTER — Ambulatory Visit (HOSPITAL_COMMUNITY): Payer: 59

## 2022-07-31 ENCOUNTER — Telehealth (HOSPITAL_COMMUNITY): Payer: Self-pay

## 2022-07-31 NOTE — Telephone Encounter (Signed)
Spoke with the patient, detailed instructions given. She stated that she would be here for her test. Asked to call back with any questions. S.Natasha Paulson EMTP/CCT 

## 2022-08-04 ENCOUNTER — Telehealth (HOSPITAL_COMMUNITY): Payer: Self-pay | Admitting: Cardiovascular Disease

## 2022-08-04 NOTE — Telephone Encounter (Signed)
Patient called and cancelled Myoview x 2 for reasons below:  08/04/2022 7:53 AM [id number] [name], [name]  Cancel Rsn: Patient (Pt not feeling well, will call to reschedule.) X2 07/29/2022 9:10 AM [id number], [name]  Cancel Rsn: Patient (Pt wanted to reschedule for following week)  Order will be removed from the [facility]. If patient calls back to reschedule we will reinstate the order. Thank you.

## 2022-08-05 ENCOUNTER — Ambulatory Visit (HOSPITAL_COMMUNITY): Payer: 59

## 2022-09-29 ENCOUNTER — Telehealth: Payer: Self-pay | Admitting: Cardiovascular Disease

## 2022-09-29 NOTE — Telephone Encounter (Signed)
Patient calling with question about the stress test that is schedule. Please advise

## 2022-09-29 NOTE — Telephone Encounter (Signed)
Patient is agitated about having her Lexiscan scheduled on two different days. She would like to know why this was scheduled this way. She states it has never been scheduled this way before and she would like to know why this is.

## 2022-09-30 ENCOUNTER — Telehealth (HOSPITAL_COMMUNITY): Payer: Self-pay | Admitting: *Deleted

## 2022-09-30 NOTE — Telephone Encounter (Signed)
 Patient given detailed instructions per Myocardial Perfusion Study Information Sheet for the test on 10/01/22 Patient notified to arrive 15 minutes early and that it is imperative to arrive on time for appointment to keep from having the test rescheduled.  If you need to cancel or reschedule your appointment, please call the office within 24 hours of your appointment. . Patient verbalized understanding. Ricky Ala

## 2022-09-30 NOTE — Telephone Encounter (Signed)
Reviewed with patient that the tracer that is given is based on body weight and there are limits to how much she can receive safely, daily.  She voices understanding and appreciation for call back.

## 2022-10-01 ENCOUNTER — Ambulatory Visit (HOSPITAL_COMMUNITY): Payer: 59 | Attending: Cardiovascular Disease

## 2022-10-01 DIAGNOSIS — I25118 Atherosclerotic heart disease of native coronary artery with other forms of angina pectoris: Secondary | ICD-10-CM | POA: Diagnosis present

## 2022-10-01 MED ORDER — REGADENOSON 0.4 MG/5ML IV SOLN
0.4000 mg | Freq: Once | INTRAVENOUS | Status: AC
  Administered 2022-10-01: 0.4 mg via INTRAVENOUS

## 2022-10-01 MED ORDER — TECHNETIUM TC 99M TETROFOSMIN IV KIT
32.0000 | PACK | Freq: Once | INTRAVENOUS | Status: AC | PRN
  Administered 2022-10-01: 32 via INTRAVENOUS

## 2022-10-02 ENCOUNTER — Ambulatory Visit (HOSPITAL_COMMUNITY): Payer: 59 | Attending: Cardiovascular Disease

## 2022-10-02 LAB — MYOCARDIAL PERFUSION IMAGING
LV dias vol: 92 mL (ref 46–106)
LV sys vol: 39 mL
Nuc Stress EF: 57 %
Peak HR: 64 {beats}/min
Rest HR: 46 {beats}/min
Rest Nuclear Isotope Dose: 31.7 mCi
SDS: 2
SRS: 2
SSS: 4
ST Depression (mm): 0 mm
Stress Nuclear Isotope Dose: 32 mCi
TID: 1.17

## 2022-10-02 MED ORDER — TECHNETIUM TC 99M TETROFOSMIN IV KIT
31.7000 | PACK | Freq: Once | INTRAVENOUS | Status: AC | PRN
  Administered 2022-10-02: 31.7 via INTRAVENOUS

## 2022-10-06 ENCOUNTER — Telehealth: Payer: Self-pay | Admitting: Cardiovascular Disease

## 2022-10-06 NOTE — Telephone Encounter (Signed)
Returned Pt's call. Pt wanted results of 7/18 Lexiscan. Informed Pt of results and that if there were any additional recommendations someone would reach out to her.

## 2022-10-06 NOTE — Telephone Encounter (Signed)
Patient called to follow-up on test results. 

## 2022-11-14 ENCOUNTER — Ambulatory Visit: Payer: 59 | Attending: Physician Assistant | Admitting: Physician Assistant

## 2022-11-14 ENCOUNTER — Encounter: Payer: Self-pay | Admitting: Physician Assistant

## 2022-11-14 VITALS — BP 108/60 | HR 60 | Ht 65.0 in | Wt 246.0 lb

## 2022-11-14 DIAGNOSIS — R600 Localized edema: Secondary | ICD-10-CM | POA: Diagnosis not present

## 2022-11-14 DIAGNOSIS — I10 Essential (primary) hypertension: Secondary | ICD-10-CM

## 2022-11-14 DIAGNOSIS — I25118 Atherosclerotic heart disease of native coronary artery with other forms of angina pectoris: Secondary | ICD-10-CM

## 2022-11-14 DIAGNOSIS — R5383 Other fatigue: Secondary | ICD-10-CM

## 2022-11-14 DIAGNOSIS — I5032 Chronic diastolic (congestive) heart failure: Secondary | ICD-10-CM

## 2022-11-14 DIAGNOSIS — E785 Hyperlipidemia, unspecified: Secondary | ICD-10-CM

## 2022-11-14 NOTE — Patient Instructions (Addendum)
Medication Instructions:  Your physician recommends that you continue on your current medications as directed. Please refer to the Current Medication list given to you today.  *If you need a refill on your cardiac medications before your next appointment, please call your pharmacy*  Lab Work: BMET, BNP-TODAY If you have labs (blood work) drawn today and your tests are completely normal, you will receive your results only by: MyChart Message (if you have MyChart) OR A paper copy in the mail If you have any lab test that is abnormal or we need to change your treatment, we will call you to review the results.  Testing/Procedures: Your physician has requested that you have an echocardiogram. Echocardiography is a painless test that uses sound waves to create images of your heart. It provides your doctor with information about the size and shape of your heart and how well your heart's chambers and valves are working. This procedure takes approximately one hour. There are no restrictions for this procedure. Please do NOT wear cologne, perfume, aftershave, or lotions (deodorant is allowed). Please arrive 15 minutes prior to your appointment time.    Follow-Up: At [facility], you and your health needs are our priority.  As part of our continuing mission to provide you with exceptional heart care, we have created designated Provider Care Teams.  These Care Teams include your primary Cardiologist (physician) and Advanced Practice Providers (APPs -  Physician Assistants and Nurse Practitioners) who all work together to provide you with the care you need, when you need it.  Your next appointment:   3 month(s)  Provider:   Verne Carrow, MD  or APP   Other Instructions Mary Sella. Andrey Campanile, MD, FACS General Surgeon, Weight Loss Surgeon  [facility] [facility]g [phone] out of 5 Accepting New Patients [phone]  Check your blood pressure daily, 1 hr after morning  medications for 2 weeks, keep a log and send [place] the readings through mychart at the end of the 2 weeks.   Weigh every morning after using the restroom, before breakfast and call and let us know if you have a weight gain of 2 lbs or more overnight or 5 lbs or more in a week.    Heart-Healthy Eating Plan Many factors influence your heart health, including eating and exercise habits. Heart health is also called coronary health. Coronary risk increases with abnormal blood fat (lipid) levels. A heart-healthy eating plan includes limiting unhealthy fats, increasing healthy fats, limiting salt (sodium) intake, and making other diet and lifestyle changes. What is my plan? Your health care provider may recommend that: You limit your fat intake to _________% or less of your total calories each day. You limit your saturated fat intake to _________% or less of your total calories each day. You limit the amount of cholesterol in your diet to less than _________ mg per day. You limit the amount of sodium in your diet to less than _________ mg per day. What are tips for following this plan? Cooking Cook foods using methods other than frying. Baking, boiling, grilling, and broiling are all good options. Other ways to reduce fat include: Removing the skin from poultry. Removing all visible fats from meats. Steaming vegetables in water or broth. Meal planning  At meals, imagine dividing your plate into fourths: Fill one-half of your plate with vegetables and green salads. Fill one-fourth of your plate with whole grains. Fill one-fourth of your plate with lean protein foods. Eat 2-4 cups of vegetables per  day. One cup of vegetables equals 1 cup (91 g) broccoli or cauliflower florets, 2 medium carrots, 1 large bell pepper, 1 large sweet potato, 1 large tomato, 1 medium white potato, 2 cups (150 g) raw leafy greens. Eat 1-2 cups of fruit per day. One cup of fruit equals 1 small apple, 1 large banana, 1 cup  (237 g) mixed fruit, 1 large orange,  cup (82 g) dried fruit, 1 cup (240 mL) 100% fruit juice. Eat more foods that contain soluble fiber. Examples include apples, broccoli, carrots, beans, peas, and barley. Aim to get 25-30 g of fiber per day. Increase your consumption of legumes, nuts, and seeds to 4-5 servings per week. One serving of dried beans or legumes equals  cup (90 g) cooked, 1 serving of nuts is  oz (12 almonds, 24 pistachios, or 7 walnut halves), and 1 serving of seeds equals  oz (8 g). Fats Choose healthy fats more often. Choose monounsaturated and polyunsaturated fats, such as olive and canola oils, avocado oil, flaxseeds, walnuts, almonds, and seeds. Eat more omega-3 fats. Choose salmon, mackerel, sardines, tuna, flaxseed oil, and ground flaxseeds. Aim to eat fish at least 2 times each week. Check food labels carefully to identify foods with trans fats or high amounts of saturated fat. Limit saturated fats. These are found in animal products, such as meats, butter, and cream. Plant sources of saturated fats include palm oil, palm kernel oil, and coconut oil. Avoid foods with partially hydrogenated oils in them. These contain trans fats. Examples are stick margarine, some tub margarines, cookies, crackers, and other baked goods. Avoid fried foods. General information Eat more home-cooked food and less restaurant, buffet, and fast food. Limit or avoid alcohol. Limit foods that are high in added sugar and simple starches such as foods made using white refined flour (white breads, pastries, sweets). Lose weight if you are overweight. Losing just 5-10% of your body weight can help your overall health and prevent diseases such as diabetes and heart disease. Monitor your sodium intake, especially if you have high blood pressure. Talk with your health care provider about your sodium intake. Try to incorporate more vegetarian meals weekly. What foods should I eat? Fruits All fresh,  canned (in natural juice), or frozen fruits. Vegetables Fresh or frozen vegetables (raw, steamed, roasted, or grilled). Green salads. Grains Most grains. Choose whole wheat and whole grains most of the time. Rice and pasta, including brown rice and pastas made with whole wheat. Meats and other proteins Lean, well-trimmed beef, veal, pork, and lamb. Chicken and [place] without skin. All fish and shellfish. Wild duck, rabbit, pheasant, and venison. Egg whites or low-cholesterol egg substitutes. Dried beans, peas, lentils, and tofu. Seeds and most nuts. Dairy Low-fat or nonfat cheeses, including ricotta and mozzarella. Skim or 1% milk (liquid, powdered, or evaporated). Buttermilk made with low-fat milk. Nonfat or low-fat yogurt. Fats and oils Non-hydrogenated (trans-free) margarines. Vegetable oils, including soybean, sesame, sunflower, olive, avocado, peanut, safflower, corn, canola, and cottonseed. Salad dressings or mayonnaise made with a vegetable oil. Beverages Water (mineral or sparkling). Coffee and tea. Unsweetened ice tea. Diet beverages. Sweets and desserts Sherbet, gelatin, and fruit ice. Small amounts of dark chocolate. Limit all sweets and desserts. Seasonings and condiments All seasonings and condiments. The items listed above may not be a complete list of foods and beverages you can eat. Contact a dietitian for more options. What foods should I avoid? Fruits Canned fruit in heavy syrup. Fruit in cream or butter sauce.  Fried fruit. Limit coconut. Vegetables Vegetables cooked in cheese, cream, or butter sauce. Fried vegetables. Grains Breads made with saturated or trans fats, oils, or whole milk. Croissants. Sweet rolls. Donuts. High-fat crackers, such as cheese crackers and chips. Meats and other proteins Fatty meats, such as hot dogs, ribs, sausage, bacon, rib-eye roast or steak. High-fat deli meats, such as salami and bologna. Caviar. Domestic duck and goose. Organ meats, such  as liver. Dairy Cream, sour cream, cream cheese, and creamed cottage cheese. Whole-milk cheeses. Whole or 2% milk (liquid, evaporated, or condensed). Whole buttermilk. Cream sauce or high-fat cheese sauce. Whole-milk yogurt. Fats and oils Meat fat, or shortening. Cocoa butter, hydrogenated oils, palm oil, coconut oil, palm kernel oil. Solid fats and shortenings, including bacon fat, salt pork, lard, and butter. Nondairy cream substitutes. Salad dressings with cheese or sour cream. Beverages Regular sodas and any drinks with added sugar. Sweets and desserts Frosting. Pudding. Cookies. Cakes. Pies. Milk chocolate or white chocolate. Buttered syrups. Full-fat ice cream or ice cream drinks. The items listed above may not be a complete list of foods and beverages to avoid. Contact a dietitian for more information. Summary Heart-healthy meal planning includes limiting unhealthy fats, increasing healthy fats, limiting salt (sodium) intake and making other diet and lifestyle changes. Lose weight if you are overweight. Losing just 5-10% of your body weight can help your overall health and prevent diseases such as diabetes and heart disease. Focus on eating a balance of foods, including fruits and vegetables, low-fat or nonfat dairy, lean protein, nuts and legumes, whole grains, and heart-healthy oils and fats. This information is not intended to replace advice given to you by your health care provider. Make sure you discuss any questions you have with your health care provider. Document Revised: 04/08/2021 Document Reviewed: 04/08/2021 Elsevier Patient Education  2024 Elsevier Inc. Low-Sodium Eating Plan Salt (sodium) helps you keep a healthy balance of fluids in your body. Too much sodium can raise your blood pressure. It can also cause fluid and waste to be held in your body. Your health care provider or dietitian may recommend a low-sodium eating plan if you have high blood pressure (hypertension),  kidney disease, liver disease, or heart failure. Eating less sodium can help lower your blood pressure and reduce swelling. It can also protect your heart, liver, and kidneys. What are tips for following this plan? Reading food labels  Check food labels for the amount of sodium per serving. If you eat more than one serving, you must multiply the listed amount by the number of servings. Choose foods with less than 140 milligrams (mg) of sodium per serving. Avoid foods with 300 mg of sodium or more per serving. Always check how much sodium is in a product, even if the label says "unsalted" or "no salt added." Shopping  Buy products labeled as "low-sodium" or "no salt added." Buy fresh foods. Avoid canned foods and pre-made or frozen meals. Avoid canned, cured, or processed meats. Buy breads that have less than 80 mg of sodium per slice. Cooking  Eat more home-cooked food. Try to eat less restaurant, buffet, and fast food. Try not to add salt when you cook. Use salt-free seasonings or herbs instead of table salt or sea salt. Check with your provider or pharmacist before using salt substitutes. Cook with plant-based oils, such as canola, sunflower, or olive oil. Meal planning When eating at a restaurant, ask if your food can be made with less salt or no salt. Avoid dishes  labeled as brined, pickled, cured, or smoked. Avoid dishes made with soy sauce, miso, or teriyaki sauce. Avoid foods that have monosodium glutamate (MSG) in them. MSG may be added to some restaurant food, sauces, soups, bouillon, and canned foods. Make meals that can be grilled, baked, poached, roasted, or steamed. These are often made with less sodium. General information Try to limit your sodium intake to 1,500-2,300 mg each day, or the amount told by your provider. What foods should I eat? Fruits Fresh, frozen, or canned fruit. Fruit juice. Vegetables Fresh or frozen vegetables. "No salt added" canned vegetables. "No  salt added" tomato sauce and paste. Low-sodium or reduced-sodium tomato and vegetable juice. Grains Low-sodium cereals, such as oats, puffed wheat and rice, and shredded wheat. Low-sodium crackers. Unsalted rice. Unsalted pasta. Low-sodium bread. Whole grain breads and whole grain pasta. Meats and other proteins Fresh or frozen meat, poultry, seafood, and fish. These should have no added salt. Low-sodium canned tuna and salmon. Unsalted nuts. Dried peas, beans, and lentils without added salt. Unsalted canned beans. Eggs. Unsalted nut butters. Dairy Milk. Soy milk. Cheese that is naturally low in sodium, such as ricotta cheese, fresh mozzarella, or Swiss cheese. Low-sodium or reduced-sodium cheese. Cream cheese. Yogurt. Seasonings and condiments Fresh and dried herbs and spices. Salt-free seasonings. Low-sodium mustard and ketchup. Sodium-free salad dressing. Sodium-free light mayonnaise. Fresh or refrigerated horseradish. Lemon juice. Vinegar. Other foods Homemade, reduced-sodium, or low-sodium soups. Unsalted popcorn and pretzels. Low-salt or salt-free chips. The items listed above may not be all the foods and drinks you can have. Talk to a dietitian to learn more. What foods should I avoid? Vegetables Sauerkraut, pickled vegetables, and relishes. Olives. [place] fries. Onion rings. Regular canned vegetables, except low-sodium or reduced-sodium items. Regular canned tomato sauce and paste. Regular tomato and vegetable juice. Frozen vegetables in sauces. Grains Instant hot cereals. Bread stuffing, pancake, and biscuit mixes. Croutons. Seasoned rice or pasta mixes. Noodle soup cups. Boxed or frozen macaroni and cheese. Regular salted crackers. Self-rising flour. Meats and other proteins Meat or fish that is salted, canned, smoked, spiced, or pickled. Precooked or cured meat, such as sausages or meat loaves. Tomasa Blase. Ham. Pepperoni. Hot dogs. Corned beef. Chipped beef. Salt pork. Jerky. Pickled herring,  anchovies, and sardines. Regular canned tuna. Salted nuts. Dairy Processed cheese and cheese spreads. Hard cheeses. Cheese curds. Blue cheese. Feta cheese. String cheese. Regular cottage cheese. Buttermilk. Canned milk. Fats and oils Salted butter. Regular margarine. Ghee. Bacon fat. Seasonings and condiments Onion salt, garlic salt, seasoned salt, table salt, and sea salt. Canned and packaged gravies. Worcestershire sauce. Tartar sauce. Barbecue sauce. Teriyaki sauce. Soy sauce, including reduced-sodium soy sauce. Steak sauce. Fish sauce. Oyster sauce. Cocktail sauce. Horseradish that you find on the shelf. Regular ketchup and mustard. Meat flavorings and tenderizers. Bouillon cubes. Hot sauce. Pre-made or packaged marinades. Pre-made or packaged taco seasonings. Relishes. Regular salad dressings. Salsa. Other foods Salted popcorn and pretzels. Corn chips and puffs. Potato and tortilla chips. Canned or dried soups. Pizza. Frozen entrees and pot pies. The items listed above may not be all the foods and drinks you should avoid. Talk to a dietitian to learn more. This information is not intended to replace advice given to you by your health care provider. Make sure you discuss any questions you have with your health care provider. Document Revised: 03/20/2022 Document Reviewed: 03/20/2022 Elsevier Patient Education  2024 [employer].

## 2022-11-14 NOTE — Progress Notes (Signed)
Cardiology Office Note:  .   Date:  11/14/2022  ID:  Caitlyn [name], DOB [birth year], MRN [id number] PCP: Loura Back, NP  [facility] Providers Cardiologist:  Verne Carrow, MD {  History of Present Illness: .   Caitlyn [name] is a 72 y.o. female with a past medical history of CAD, hyperlipidemia, hypothyroidism, sleep apnea on CPAP, Graves' disease status post thyroid ablation, PAD, COPD, chronic diastolic CHF, carotid artery disease and HTN here for follow-up appointment.  Had an MI April 2011 and had 2 stents placed in the RCA. She was smoking at that time.   Today, she tells me that she has been having some chest pains and it might be her acid reflux.  She states it comes and goes.  Nonexertional.  Legs are swollen and she is tired a lot.  We discussed updating an echocardiogram.  She sometimes only takes 1 dose of her Demadex because she forgets to take the second dose.  This could be part of the reason she is having swelling in her legs.  Also works all day on her feet and we discussed wearing lower extremity compression.  Gave her handout on elastic therapy.  Also interested in weight loss surgery and we provided her with contacts at [facility] surgery.    Reports no shortness of breath nor dyspnea on exertion. No orthopnea, PND. Reports no palpitations.     ROS: Pertinent ROS in HPI  Studies Reviewed: Marland Kitchen       Stress test 09/2022  The study is normal. The study is low risk.   No ST deviation was noted.   LV perfusion is normal. There is no evidence of ischemia. There is no evidence of infarction.   Left ventricular function is normal. Nuclear stress EF: 57%. The left ventricular ejection fraction is normal (55-65%). End diastolic cavity size is normal. End systolic cavity size is normal.   Prior study available for comparison from 01/15/2021.   Normal stress nuclear study with apical thinning but no ischemia or infarction.  Gated ejection fraction 57%  with normal wall motion.     Physical Exam:   VS:  BP 108/60   Pulse 60   Ht 5\' 5"  (1.651 m)   Wt 246 lb (111.6 kg)   SpO2 96%   BMI 40.94 kg/m    Wt Readings from Last 3 Encounters:  11/14/22 246 lb (111.6 kg)  10/01/22 237 lb (107.5 kg)  07/25/22 237 lb 6.4 oz (107.7 kg)    GEN: Well nourished, well developed in no acute distress NECK: No JVD; No carotid bruits CARDIAC: RRR, no murmurs, rubs, gallops RESPIRATORY:  Clear to auscultation without rales, wheezing or rhonchi  ABDOMEN: Soft, non-tender, non-distended EXTREMITIES:  No edema; No deformity   ASSESSMENT AND PLAN: .   1.  CAD -Negative stress test -Will update echocardiogram -Would continue current medications for now which include carvedilol 6.25 twice daily, Plavix 75 mg daily, Zetia 10 mg daily, Cozaar 100 mg daily, nitro as needed, Crestor 20 mg at bedtime, Demadex 20 mg twice daily  2.  Carotid artery disease -Followed by [facility]  3.  Hypertension -Well-controlled today, did not tolerate amlodipine -Encouraged her to continue to track at home -Encourage low-sodium, heart healthy diet  4.  HLD -LDL 67, at goal -Will continue Crestor and Zetia  5.  Chronic diastolic CHF/fatigue   -Update echocardiogram -Otherwise encourage workup through PCP -Recent stress test was negative  6. Volume overload/LE edema -Continue prescribed  dosing of Demadex 20 mg twice daily (sometimes she misses a dose) -We will check a BMP and a BNP today and increase Demadex if indicated   Dispo: She can follow-up in a few months with Dr. Clifton James  Signed, Sharlene Dory, PA-C

## 2022-11-15 LAB — BASIC METABOLIC PANEL
BUN/Creatinine Ratio: 23 (ref 12–28)
BUN: 22 mg/dL (ref 8–27)
CO2: 24 mmol/L (ref 20–29)
Calcium: 9.3 mg/dL (ref 8.7–10.3)
Chloride: 103 mmol/L (ref 96–106)
Creatinine, Ser: 0.94 mg/dL (ref 0.57–1.00)
Glucose: 93 mg/dL (ref 70–99)
Potassium: 4.7 mmol/L (ref 3.5–5.2)
Sodium: 140 mmol/L (ref 134–144)
eGFR: 66 mL/min/{1.73_m2} (ref >59)

## 2022-11-15 LAB — PRO B NATRIURETIC PEPTIDE: NT-Pro BNP: 246 pg/mL (ref 0–301)

## 2022-11-19 ENCOUNTER — Telehealth: Payer: Self-pay | Admitting: Cardiovascular Disease

## 2022-11-19 NOTE — Telephone Encounter (Signed)
Patient want to discuss her labs results. Patient wants Julian Hy to be the one to call her back. Please advise

## 2022-11-19 NOTE — Telephone Encounter (Signed)
Informed patient Caitlyn [name] is not in the office today. Attempted to assist patient, but she insists on having [name] call her when she returns to the office tomorrow to discuss her recent lab results. Patient did not want to discuss further with me.

## 2022-11-20 NOTE — Telephone Encounter (Signed)
Returned call to pt.  She has been made aware of her lab results.  Pt did state that Jari Favre, NP, had mentioned another diuretic for her swelling and she is wanting to see if that has been called in?  Will forward to [facility] to address.

## 2022-11-20 NOTE — Telephone Encounter (Signed)
Patient is following up requesting another call to discuss lab results. She would prefer a call back from [place], [place], but she understands that her call may be returned by a triage nurse.

## 2022-11-21 NOTE — Telephone Encounter (Signed)
Returned call to pt and she has been made aware to continue her current regimen of Torsemide.  She verbalized understanding.

## 2022-12-03 ENCOUNTER — Ambulatory Visit (HOSPITAL_COMMUNITY): Payer: 59 | Attending: Physician Assistant

## 2022-12-03 DIAGNOSIS — R600 Localized edema: Secondary | ICD-10-CM

## 2022-12-03 LAB — ECHOCARDIOGRAM COMPLETE
Area-P 1/2: 2.87 cm2
S' Lateral: 2.75 cm

## 2023-01-06 ENCOUNTER — Telehealth: Payer: Self-pay | Admitting: Cardiovascular Disease

## 2023-01-06 NOTE — Telephone Encounter (Signed)
   Pt c/o of Chest Pain: STAT if active CP, including tightness, pressure, jaw pain, radiating pain to shoulder/upper arm/back, CP unrelieved by Nitro. Symptoms reported of SOB, nausea, vomiting, sweating.  1. Are you having CP right now?   Yes on left side arm and back side  2. Are you experiencing any other symptoms (ex. SOB, nausea, vomiting, sweating)?   No  3. Is your CP continuous or coming and going?   Comes and goes for the last couple of days  4. Have you taken Nitroglycerin?   No  5. How long have you been experiencing CP?   Last couple of days   6. If NO CP at time of call then end call with telling Pt to call back or call 911 if Chest pain returns prior to return call from triage team.   Patient is concerned about the pain she is having on her left side, arm and back area.

## 2023-01-06 NOTE — Telephone Encounter (Signed)
Call to patient. She picks up and then the phone disconnects x3

## 2023-01-07 ENCOUNTER — Encounter: Payer: Self-pay | Admitting: Cardiovascular Disease

## 2023-01-07 ENCOUNTER — Ambulatory Visit: Payer: 59 | Attending: Cardiovascular Disease | Admitting: Cardiovascular Disease

## 2023-01-07 VITALS — BP 128/76 | HR 68 | Ht 65.0 in | Wt 245.0 lb

## 2023-01-07 DIAGNOSIS — I5032 Chronic diastolic (congestive) heart failure: Secondary | ICD-10-CM | POA: Diagnosis not present

## 2023-01-07 DIAGNOSIS — I10 Essential (primary) hypertension: Secondary | ICD-10-CM | POA: Diagnosis not present

## 2023-01-07 DIAGNOSIS — E782 Mixed hyperlipidemia: Secondary | ICD-10-CM

## 2023-01-07 DIAGNOSIS — I25118 Atherosclerotic heart disease of native coronary artery with other forms of angina pectoris: Secondary | ICD-10-CM | POA: Diagnosis not present

## 2023-01-07 NOTE — Patient Instructions (Signed)

## 2023-01-07 NOTE — Progress Notes (Signed)
Chief Complaint  Patient presents with   Follow-up    Left arm pain    History of Present Illness: 72 yo female with history of CAD, hyperlipidemia, hypothyroidism, sleep apnea on CPAP, Graves disease s/p thyroid ablation, PAD, COPD, chronic diastolic CHF, carotid artery disease and HTN here for cardiac follow up. She had an MI in April 2011 and had 2 drug eluting stents placed in the RCA. She had chest pains leading to a stress test and cardiac cath in December 2012. Cardiac cath December 2012 with stable CAD. She began following with Dr. Sharyn Lull for a number of years. Cardiac cath in 2017 with severe RCA stenosis treated with a drug eluting stent. Mild disease in the LAD and Circumflex. She was seen by Dr. Kirke Corin in October 2022 for pre-operative assessment and reported dyspnea but no chest pain. Nuclear stress test November 2022 with no evidence of ischemia. Echo August 2023 with LVEF=60-65%. Mild LVH. Grade 1 diastolic dysfunction. Trivial MR. Carotid artery dopplers 04/10/22 with 40-59% RICA stenosis, 1-39% left ICA stenosis. Severe right vertebral artery stenosis. Carotid disease followed in [facility]. I saw her in May 2024 and she reported fatigue, occasional chest pressure. She was told in primary care that her kidney function was not normal and she was advised to cut her Torsemide in half to 20 mg per day and told to stop aldactone. Nuclear stress test July 2024 with no ischemia. Echo September 2024 with LVEF=55-60%. No significant valve disease.   She is here today for follow up. She called into our office and reported continuous left arm pain for 3 weeks. The arm hurts to move. The pain is also around her back and left shoulder. No chest pain. No dyspnea, palpitations, lower extremity edema, orthopnea, PND, dizziness, near syncope or syncope.   Primary Care Provider: Loura Back, NP  Past Medical History:  Diagnosis Date   Anxiety    Arthritis    "legs" (01/07/2016)   Back pain    Back  pain    CAD 06/2009   a.  s/p MI 4/11: tx with DES x 2 to RCA;  b.  LHC 03/17/11: LAD 30-40%, distal LAD 30-40%, OM2 40-50%, RCA stent patent, EF greater than 70%.;  c.  Lexiscan Myoview (12/15):  Mild apical thinning, no ischemia, EF 61%; normal study   CAROTID STENOSIS    carotid Dopplers 03/25/11: RICA 60-79%; LICA 0-39%.  Follow up recommended in 6 months.   Constipation    Constipation    COPD mixed type (HCC)    Depression    Gout    "on RX for it" (01/07/2016)   Heart disease    History of heart attack    Hx of blood clots    HYPERLIPIDEMIA    HYPERTENSION    HYPOTHYROIDISM    post ablation tx Graves   Hypothyroidism    Joint pain    Myocardial infarction [facility]) 2012   "mini"   OSA on CPAP    Osteoarthritis    Pre-diabetes    PVD    Rheumatoid arthritis (HCC)    Sleep apnea    SOBOE (shortness of breath on exertion)    TOBACCO ABUSE quit 06/2009   Varicose veins    VITAMIN D DEFICIENCY    DEXA 04/2009 normal    Past Surgical History:  Procedure Laterality Date   CARDIAC CATHETERIZATION N/A 01/07/2016   Procedure: Left Heart Cath and Coronary Angiography;  Surgeon: Rinaldo Cloud, MD;  Location: [facility] INVASIVE  CV LAB;  Service: Cardiovascular;  Laterality: N/A;   CARDIAC CATHETERIZATION N/A 01/07/2016   Procedure: Coronary Stent Intervention;  Surgeon: Rinaldo Cloud, MD;  Location: [facility] INVASIVE CV LAB;  Service: Cardiovascular;  Laterality: N/A;   CAROTID STENT Left    bilateral carotid artery disease post stent of left carotid   CESAREAN SECTION  1985   "twins"   CORONARY ANGIOPLASTY     LEFT HEART CATHETERIZATION WITH CORONARY ANGIOGRAM N/A 03/17/2011   Procedure: LEFT HEART CATHETERIZATION WITH CORONARY ANGIOGRAM;  Surgeon: Herby Abraham, MD;  Location: [facility] CATH LAB;  Service: Cardiovascular;  Laterality: N/A;   PERIPHERAL VASCULAR CATHETERIZATION N/A 04/06/2015   Procedure: Abdominal Aortogram;  Surgeon: Sherren Kerns, MD;  Location: [facility] INVASIVE CV LAB;  Service:  Cardiovascular;  Laterality: N/A;   RADIOACTIVE PLAQUE INSERTION     Graves disease post radioactive treatment complicated hypothyroidism   TOTAL HIP ARTHROPLASTY Left 01/22/2021   Procedure: LEFT TOTAL HIP ARTHROPLASTY ANTERIOR APPROACH;  Surgeon: Marcene Corning, MD;  Location: [facility] ORS;  Service: Orthopedics;  Laterality: Left;    Current Outpatient Medications  Medication Sig Dispense Refill   allopurinol (ZYLOPRIM) 300 MG tablet Take 300 mg by mouth daily.     carvedilol (COREG) 6.25 MG tablet Take 1 tablet (6.25 mg total) by mouth 2 (two) times daily with a meal. 180 tablet 3   Cholecalciferol (VITAMIN D) 50 MCG (2000 UT) tablet Take 1 tablet (2,000 Units total) by mouth daily.     clopidogrel (PLAVIX) 75 MG tablet Take 75 mg by mouth daily.     colchicine 0.6 MG tablet Take 1.2 mg by mouth daily as needed (Gout flare ups).     ezetimibe (ZETIA) 10 MG tablet TAKE 1 TABLET BY MOUTH EVERY DAY 90 tablet 3   levothyroxine (SYNTHROID, LEVOTHROID) 125 MCG tablet Take 125 mcg by mouth daily before breakfast.     LINZESS 145 MCG CAPS capsule Take 1 capsule (145 mcg total) by mouth daily. 30 capsule 11   losartan (COZAAR) 100 MG tablet Take 1 tablet (100 mg total) by mouth daily. 90 tablet 3   MAGNESIUM PO Take 1 tablet by mouth daily.     nitroGLYCERIN (NITROSTAT) 0.4 MG SL tablet Place 1 tablet (0.4 mg total) under the tongue every 5 (five) minutes as needed for chest pain. 25 tablet 3   potassium chloride (KLOR-CON) 10 MEQ tablet Take 1 tablet (10 mEq total) by mouth daily as needed. Taking 1 tablet when taking Lasix (Patient taking differently: Take 10 mEq by mouth daily.) 30 tablet 0   rosuvastatin (CRESTOR) 40 MG tablet Take 20 mg by mouth at bedtime.     Selenium 200 MCG CAPS Take 200 mcg by mouth daily.     torsemide (DEMADEX) 20 MG tablet Take 20 mg by mouth 2 (two) times daily.     Vitamin D, Ergocalciferol, (DRISDOL) 1.25 MG (50000 UNIT) CAPS capsule 1 PO Q 5 DAYS (Patient taking  differently: Take 50,000 Units by mouth every 7 (seven) days.) 6 capsule 0   zinc gluconate 50 MG tablet Take 50 mg by mouth daily.     No current facility-administered medications for this visit.    Allergies  Allergen Reactions   Amlodipine Swelling   Lisinopril Cough   Statins Other (See Comments)    Myalgia    Social History   Socioeconomic History   Marital status: Divorced    Spouse name: Not on file   Number of children: Not on file  Years of education: Not on file   Highest education level: Not on file  Occupational History   Occupation: retired, Comptroller for mental health  Tobacco Use   Smoking status: Former    Current packs/day: 0.00    Average packs/day: 1 pack/day for 35.0 years (35.0 ttl pk-yrs)    Types: Cigarettes    Start date: 07/01/1974    Quit date: 06/30/2009    Years since quitting: 13.5   Smokeless tobacco: Never  Vaping Use   Vaping status: Never Used  Substance and Sexual Activity   Alcohol use: Yes    Alcohol/week: 2.0 standard drinks of alcohol    Types: 2 Glasses of wine per week    Comment: occ   Drug use: No   Sexual activity: Not Currently    Comment: Quit smoking after MI- prev 1ppd x 35ys  Other Topics Concern   Not on file  Social History Narrative   Not on file   Social Determinants of Health   Financial Resource Strain: Not on file  Food Insecurity: Not on file  Transportation Needs: Not on file  Physical Activity: Not on file  Stress: Not on file  Social Connections: Not on file  Intimate Partner Violence: Not on file    Family History  Problem Relation Age of Onset   Heart attack Mother    Stroke Mother    Cancer Mother    Varicose Veins Mother    Thyroid disease Mother    Hypertension Mother    Heart disease Mother    Sudden death Mother    Depression Mother    Cancer Father    Heart disease Father    Hypertension Father    Coronary artery disease Other    Hypertension Other    Hypertension Sister     Breast cancer Paternal Aunt     Review of Systems:  As stated in the HPI and otherwise negative.   BP 128/76   Pulse 68   Ht 5\' 5"  (1.651 m)   Wt 111.1 kg   SpO2 100%   BMI 40.77 kg/m   Physical Examination: General: Well developed, well nourished, NAD  HEENT: OP clear, mucus membranes moist  SKIN: warm, dry. No rashes. Neuro: No focal deficits  Musculoskeletal: Muscle strength 5/5 all ext  Psychiatric: Mood and affect normal  Neck: No JVD, no carotid bruits, no thyromegaly, no lymphadenopathy.  Lungs:Clear bilaterally, no wheezes, rhonci, crackles Cardiovascular: Regular rate and rhythm. No murmurs, gallops or rubs. Abdomen:Soft. Bowel sounds present. Non-tender.  Extremities: No lower extremity edema. Pulses are 2 + in the bilateral DP/PT.\.  EKG is performed today The EKG demonstrates  EKG Interpretation Date/Time:  Wednesday January 07 2023 08:59:32 EDT Ventricular Rate:  56 PR Interval:  206 QRS Duration:  74 QT Interval:  412 QTC Calculation: 397 R Axis:   32  Text Interpretation: Sinus bradycardia Anteroseptal infarct , age undetermined When compared with ECG of 08-Sep-2018 18:56, Anteroseptal infarct is now Present Confirmed by Verne Carrow [phone]) on 01/07/2023 9:02:01 AM   Lipid Panel     Component Value Date/Time   CHOL 142 11/05/2021 0846   TRIG 104 11/05/2021 0846   TRIG 114 02/04/2010 0000   HDL 56 11/05/2021 0846   CHOLHDL 2.5 11/05/2021 0846   CHOLHDL 4.4 06/29/2017 0840   VLDL 41 (H) 06/29/2017 0840   LDLCALC 67 11/05/2021 0846   LDLDIRECT 156.4 08/02/2012 1713   LABVLDL 19 11/05/2021 0846     Assessment and  Plan:   1. CAD with stable angina: She is not having chest pain. Her left arm pain does not appear to be cardiac related. EKG is unchanged. Nuclear stress test in July 2024 with no ischemia. Echo September 2024 with normal LV systolic function. Continue Plavix, statin and beta blocker.      2. Carotid artery disease: Followed by  [facility]  3. HTN: BP is controlled. She did not tolerate Norvasc. No changes today  4. HLD: LDL 67 in 2023. Will continue Crestor and Zetia.    5. Chronic diastolic CHF: Wt is stable. No volume overload on exam. Continue Torsemide 20 mg po BID  6. Left arm pain: This is most likely musculoskeletal. The pain has been continuous for 3 weeks and worsens with movement. I have asked her to contact primary care for evaluation.   Disposition: Follow up with me in 12 months  Verne Carrow, MD, [facility] 01/07/2023 9:21 AM

## 2023-01-07 NOTE — Telephone Encounter (Signed)
Pt seen in office today by Dr Clifton James. Closing this encounter as pt was unable to be reached prior to encounter.

## 2023-01-30 ENCOUNTER — Other Ambulatory Visit: Payer: Self-pay

## 2023-01-30 DIAGNOSIS — R635 Abnormal weight gain: Secondary | ICD-10-CM

## 2023-01-30 NOTE — Addendum Note (Signed)
Addended by: Altamese Kelly on: 01/30/2023 09:43 AM   Modules accepted: Orders

## 2023-02-05 ENCOUNTER — Other Ambulatory Visit: Payer: 59

## 2023-02-08 ENCOUNTER — Other Ambulatory Visit: Payer: Self-pay | Admitting: Cardiovascular Disease

## 2023-02-10 ENCOUNTER — Encounter: Payer: Self-pay | Admitting: "Endocrinology

## 2023-02-10 ENCOUNTER — Other Ambulatory Visit: Payer: Self-pay

## 2023-02-10 ENCOUNTER — Ambulatory Visit (INDEPENDENT_AMBULATORY_CARE_PROVIDER_SITE_OTHER): Payer: 59 | Admitting: "Endocrinology

## 2023-02-10 VITALS — BP 134/80 | HR 85 | Ht 65.0 in | Wt 249.2 lb

## 2023-02-10 DIAGNOSIS — E890 Postprocedural hypothyroidism: Secondary | ICD-10-CM | POA: Diagnosis not present

## 2023-02-10 DIAGNOSIS — E1165 Type 2 diabetes mellitus with hyperglycemia: Secondary | ICD-10-CM

## 2023-02-10 DIAGNOSIS — R635 Abnormal weight gain: Secondary | ICD-10-CM

## 2023-02-10 NOTE — Progress Notes (Signed)
Outpatient Endocrinology Note Caitlyn [name] [place], MD    Caitlyn [name] 1950/09/15 [id number]  Referring Provider: Loura Back, NP Primary Care Provider: Loura Back, NP Reason for consultation: Subjective   Assessment & Plan  Diagnoses and all orders for this visit:  Postablative hypothyroidism  Morbid obesity (HCC)  History of Graves' disease status post radioactive iodine ablation in 1990s at [facility] Currently on levothyroxine 125 mcg p.o. daily, takes appropriately No obstructive symptoms Report notes hypothyroid symptoms Ordered lab  Morbid obesity, complicated by hyperlipidemia Patient interested in bariatric surgery Patient encouraged to speak to a bariatric surgeon to discuss her options Currently on Ozempic without weight loss, instructed to discuss with PCP to consider switching to [facility] Ordered baseline labs  Return in about 4 months (around 06/10/2023) for visit, labs today.   I have reviewed current medications, nurse's notes, allergies, vital signs, past medical and surgical history, family medical history, and social history for this encounter. Counseled patient on symptoms, examination findings, lab findings, imaging results, treatment decisions and monitoring and prognosis. The patient understood the recommendations and agrees with the treatment plan. All questions regarding treatment plan were fully answered.  Caitlyn [name] , MD  02/10/23   History of Present Illness HPI  Caitlyn [name] is a 72 y.o. year old female who presents for evaluation of inability to lose weight and postablative hypothyroidism.  C/o inability to lose weight and thyroid issues History of Graves disease s/p RAI treatment in 1990s On levothyroxine 125 mcg every day in morning, takes appropriately  No dysphagia/dysphonia/dyspnea  Feels tired, on linzess for constipation, feels cold more than usual   On ozempic 0.25 mg/week  Patient doesn't associate her weight to any  medications   Physical Exam  BP 134/80   Pulse 85   Ht 5\' 5"  (1.651 m)   Wt 249 lb 3.2 oz (113 kg)   SpO2 95%   BMI 41.47 kg/m    Constitutional: well developed, well nourished Head: normocephalic, atraumatic Eyes: sclera anicteric, no redness Neck: supple Lungs: normal respiratory effort Neurology: alert and oriented Skin: dry, no appreciable rashes Musculoskeletal: no appreciable defects Psychiatric: normal mood and affect   Current Medications Patient's Medications  New Prescriptions   No medications on file  Previous Medications   ALLOPURINOL (ZYLOPRIM) 300 MG TABLET    Take 300 mg by mouth daily.   CARVEDILOL (COREG) 6.25 MG TABLET    Take 1 tablet (6.25 mg total) by mouth 2 (two) times daily with a meal.   CHOLECALCIFEROL (VITAMIN D) 50 MCG (2000 UT) TABLET    Take 1 tablet (2,000 Units total) by mouth daily.   CLOPIDOGREL (PLAVIX) 75 MG TABLET    Take 75 mg by mouth daily.   COLCHICINE 0.6 MG TABLET    Take 1.2 mg by mouth daily as needed (Gout flare ups).   EZETIMIBE (ZETIA) 10 MG TABLET    TAKE 1 TABLET BY MOUTH EVERY DAY   LEVOTHYROXINE (SYNTHROID, LEVOTHROID) 125 MCG TABLET    Take 125 mcg by mouth daily before breakfast.   LINZESS 145 MCG CAPS CAPSULE    Take 1 capsule (145 mcg total) by mouth daily.   LOSARTAN (COZAAR) 100 MG TABLET    Take 1 tablet (100 mg total) by mouth daily.   MAGNESIUM PO    Take 1 tablet by mouth daily.   NITROGLYCERIN (NITROSTAT) 0.4 MG SL TABLET    Place 1 tablet (0.4 mg total) under the tongue every 5 (five) minutes as needed  for chest pain.   POTASSIUM CHLORIDE (KLOR-CON) 10 MEQ TABLET    Take 1 tablet (10 mEq total) by mouth daily as needed. Taking 1 tablet when taking Lasix   ROSUVASTATIN (CRESTOR) 40 MG TABLET    Take 20 mg by mouth at bedtime.   SELENIUM 200 MCG CAPS    Take 200 mcg by mouth daily.   TORSEMIDE (DEMADEX) 20 MG TABLET    TAKE 1 TABLET BY MOUTH TWICE A DAY   VITAMIN D, ERGOCALCIFEROL, (DRISDOL) 1.25 MG (50000 UNIT)  CAPS CAPSULE    1 PO Q 5 DAYS   ZINC GLUCONATE 50 MG TABLET    Take 50 mg by mouth daily.  Modified Medications   No medications on file  Discontinued Medications   No medications on file    Allergies Allergies  Allergen Reactions   Amlodipine Swelling   Lisinopril Cough   Statins Other (See Comments)    Myalgia    Past Medical History Past Medical History:  Diagnosis Date   Anxiety    Arthritis    "legs" (01/07/2016)   Back pain    Back pain    CAD 06/2009   a.  s/p MI 4/11: tx with DES x 2 to RCA;  b.  LHC 03/17/11: LAD 30-40%, distal LAD 30-40%, OM2 40-50%, RCA stent patent, EF greater than 70%.;  c.  Lexiscan Myoview (12/15):  Mild apical thinning, no ischemia, EF 61%; normal study   CAROTID STENOSIS    carotid Dopplers 03/25/11: RICA 60-79%; LICA 0-39%.  Follow up recommended in 6 months.   Constipation    Constipation    COPD mixed type (HCC)    Depression    Gout    "on RX for it" (01/07/2016)   Heart disease    History of heart attack    Hx of blood clots    HYPERLIPIDEMIA    HYPERTENSION    HYPOTHYROIDISM    post ablation tx Graves   Hypothyroidism    Joint pain    Myocardial infarction [facility]) 2012   "mini"   OSA on CPAP    Osteoarthritis    Pre-diabetes    PVD    Rheumatoid arthritis (HCC)    Sleep apnea    SOBOE (shortness of breath on exertion)    TOBACCO ABUSE quit 06/2009   Varicose veins    VITAMIN D DEFICIENCY    DEXA 04/2009 normal    Past Surgical History Past Surgical History:  Procedure Laterality Date   CARDIAC CATHETERIZATION N/A 01/07/2016   Procedure: Left Heart Cath and Coronary Angiography;  Surgeon: Rinaldo Cloud, MD;  Location: [facility] INVASIVE CV LAB;  Service: Cardiovascular;  Laterality: N/A;   CARDIAC CATHETERIZATION N/A 01/07/2016   Procedure: Coronary Stent Intervention;  Surgeon: Rinaldo Cloud, MD;  Location: [facility] INVASIVE CV LAB;  Service: Cardiovascular;  Laterality: N/A;   CAROTID STENT Left    bilateral carotid artery  disease post stent of left carotid   CESAREAN SECTION  1985   "twins"   CORONARY ANGIOPLASTY     LEFT HEART CATHETERIZATION WITH CORONARY ANGIOGRAM N/A 03/17/2011   Procedure: LEFT HEART CATHETERIZATION WITH CORONARY ANGIOGRAM;  Surgeon: Herby Abraham, MD;  Location: [facility] CATH LAB;  Service: Cardiovascular;  Laterality: N/A;   PERIPHERAL VASCULAR CATHETERIZATION N/A 04/06/2015   Procedure: Abdominal Aortogram;  Surgeon: Sherren Kerns, MD;  Location: [facility] INVASIVE CV LAB;  Service: Cardiovascular;  Laterality: N/A;   RADIOACTIVE PLAQUE INSERTION     Graves disease post  radioactive treatment complicated hypothyroidism   TOTAL HIP ARTHROPLASTY Left 01/22/2021   Procedure: LEFT TOTAL HIP ARTHROPLASTY ANTERIOR APPROACH;  Surgeon: Marcene Corning, MD;  Location: [facility] ORS;  Service: Orthopedics;  Laterality: Left;    Family History family history includes Breast cancer in her paternal aunt; Cancer in her father and mother; Coronary artery disease in an other family member; Depression in her mother; Heart attack in her mother; Heart disease in her father and mother; Hypertension in her father, mother, sister, and another family member; Stroke in her mother; Sudden death in her mother; Thyroid disease in her mother; Varicose Veins in her mother.  Social History Social History   Socioeconomic History   Marital status: Divorced    Spouse name: Not on file   Number of children: Not on file   Years of education: Not on file   Highest education level: Not on file  Occupational History   Occupation: retired, Comptroller for mental health  Tobacco Use   Smoking status: Former    Current packs/day: 0.00    Average packs/day: 1 pack/day for 35.0 years (35.0 ttl pk-yrs)    Types: Cigarettes    Start date: 07/01/1974    Quit date: 06/30/2009    Years since quitting: 13.6   Smokeless tobacco: Never  Vaping Use   Vaping status: Never Used  Substance and Sexual Activity   Alcohol use: Yes    Alcohol/week:  2.0 standard drinks of alcohol    Types: 2 Glasses of wine per week    Comment: occ   Drug use: No   Sexual activity: Not Currently    Comment: Quit smoking after MI- prev 1ppd x 35ys  Other Topics Concern   Not on file  Social History Narrative   Not on file   Social Determinants of Health   Financial Resource Strain: Not on file  Food Insecurity: Not on file  Transportation Needs: Not on file  Physical Activity: Not on file  Stress: Not on file  Social Connections: Not on file  Intimate Partner Violence: Not on file    Lab Results  Component Value Date   CHOL 142 11/05/2021   Lab Results  Component Value Date   HDL 56 11/05/2021   Lab Results  Component Value Date   LDLCALC 67 11/05/2021   Lab Results  Component Value Date   TRIG 104 11/05/2021   Lab Results  Component Value Date   CHOLHDL 2.5 11/05/2021   Lab Results  Component Value Date   CREATININE 0.94 11/14/2022   Lab Results  Component Value Date   GFR 70.54 03/21/2021      Component Value Date/Time   NA 140 11/14/2022 1620   K 4.7 11/14/2022 1620   CL 103 11/14/2022 1620   CO2 24 11/14/2022 1620   GLUCOSE 93 11/14/2022 1620   GLUCOSE 76 03/21/2021 1220   BUN 22 11/14/2022 1620   CREATININE 0.94 11/14/2022 1620   CALCIUM 9.3 11/14/2022 1620   PROT 6.8 11/05/2021 0846   ALBUMIN 4.2 11/05/2021 0846   AST 16 11/05/2021 0846   ALT 12 11/05/2021 0846   ALKPHOS 117 11/05/2021 0846   BILITOT 0.3 11/05/2021 0846   GFRNONAA >60 01/16/2021 1449   GFRAA 79 05/03/2020 0933      Latest Ref Rng & Units 11/14/2022    4:20 PM 05/10/2021    1:32 PM 04/24/2021   10:10 AM  BMP  Glucose 70 - 99 mg/dL 93  83  841  BUN 8 - 27 mg/dL 22  19  20    Creatinine 0.57 - 1.00 mg/dL 4.25  9.56  3.87   BUN/Creat Ratio 12 - 28 23  23  23    Sodium 134 - 144 mmol/L 140  140  140   Potassium 3.5 - 5.2 mmol/L 4.7  4.2  4.5   Chloride 96 - 106 mmol/L 103  104  104   CO2 20 - 29 mmol/L 24  23  25    Calcium 8.7 - 10.3  mg/dL 9.3  9.3  9.8        Component Value Date/Time   WBC 5.4 03/21/2021 1220   RBC 4.47 03/21/2021 1220   HGB 13.7 03/21/2021 1220   HGB 14.4 05/03/2020 0933   HCT 42.0 03/21/2021 1220   HCT 44.6 05/03/2020 0933   PLT 214.0 03/21/2021 1220   PLT 232 05/03/2020 0933   MCV 93.9 03/21/2021 1220   MCV 93 05/03/2020 0933   MCH 31.9 01/16/2021 1449   MCHC 32.6 03/21/2021 1220   RDW 14.5 03/21/2021 1220   RDW 13.4 05/03/2020 0933   LYMPHSABS 1.2 03/21/2021 1220   LYMPHSABS 1.6 05/03/2020 0933   MONOABS 0.3 03/21/2021 1220   EOSABS 0.1 03/21/2021 1220   EOSABS 0.2 05/03/2020 0933   BASOSABS 0.0 03/21/2021 1220   BASOSABS 0.0 05/03/2020 0933   Lab Results  Component Value Date   TSH 1.080 11/21/2020   TSH 0.716 05/03/2020   TSH 3.000 01/13/2018   FREET4 1.57 11/21/2020   FREET4 1.91 (H) 05/03/2020   FREET4 1.65 01/13/2018         Parts of this note may have been dictated using voice recognition software. There may be variances in spelling and vocabulary which are unintentional. Not all errors are proofread. Please notify the Thereasa Parkin if any discrepancies are noted or if the meaning of any statement is not clear.

## 2023-02-15 LAB — COMPREHENSIVE METABOLIC PANEL
AG Ratio: 1.2 (calc) (ref 1.0–2.5)
ALT: 29 U/L (ref 6–29)
AST: 31 U/L (ref 10–35)
Albumin: 3.9 g/dL (ref 3.6–5.1)
Alkaline phosphatase (APISO): 123 U/L (ref 37–153)
BUN/Creatinine Ratio: 25 (calc) — ABNORMAL HIGH (ref 6–22)
BUN: 26 mg/dL — ABNORMAL HIGH (ref 7–25)
CO2: 31 mmol/L (ref 20–32)
Calcium: 9.7 mg/dL (ref 8.6–10.4)
Chloride: 101 mmol/L (ref 98–110)
Creat: 1.04 mg/dL — ABNORMAL HIGH (ref 0.60–1.00)
Globulin: 3.3 g/dL (ref 1.9–3.7)
Glucose, Bld: 83 mg/dL (ref 65–99)
Potassium: 4.2 mmol/L (ref 3.5–5.3)
Sodium: 141 mmol/L (ref 135–146)
Total Bilirubin: 0.4 mg/dL (ref 0.2–1.2)
Total Protein: 7.2 g/dL (ref 6.1–8.1)

## 2023-02-15 LAB — CORTISOL: Cortisol, Plasma: 14.5 ug/dL

## 2023-02-15 LAB — TSH: TSH: 2.24 m[IU]/L (ref 0.40–4.50)

## 2023-02-15 LAB — HEMOGLOBIN A1C
Hgb A1c MFr Bld: 5.9 %{Hb} — ABNORMAL HIGH (ref <5.7)
Mean Plasma Glucose: 123 mg/dL
eAG (mmol/L): 6.8 mmol/L

## 2023-02-15 LAB — ACTH: C206 ACTH: 7 pg/mL (ref 6–50)

## 2023-02-15 LAB — T4, FREE: Free T4: 1.6 ng/dL (ref 0.8–1.8)

## 2023-02-19 ENCOUNTER — Encounter: Payer: Self-pay | Admitting: Physician Assistant

## 2023-02-19 ENCOUNTER — Ambulatory Visit: Payer: 59 | Attending: Physician Assistant | Admitting: Physician Assistant

## 2023-02-19 VITALS — BP 104/67 | HR 67 | Ht 65.0 in | Wt 250.6 lb

## 2023-02-19 DIAGNOSIS — E785 Hyperlipidemia, unspecified: Secondary | ICD-10-CM

## 2023-02-19 DIAGNOSIS — R5383 Other fatigue: Secondary | ICD-10-CM

## 2023-02-19 DIAGNOSIS — I5032 Chronic diastolic (congestive) heart failure: Secondary | ICD-10-CM

## 2023-02-19 DIAGNOSIS — I6523 Occlusion and stenosis of bilateral carotid arteries: Secondary | ICD-10-CM | POA: Diagnosis not present

## 2023-02-19 DIAGNOSIS — I2510 Atherosclerotic heart disease of native coronary artery without angina pectoris: Secondary | ICD-10-CM

## 2023-02-19 DIAGNOSIS — I739 Peripheral vascular disease, unspecified: Secondary | ICD-10-CM

## 2023-02-19 DIAGNOSIS — R600 Localized edema: Secondary | ICD-10-CM

## 2023-02-19 MED ORDER — TORSEMIDE 20 MG PO TABS: 20.0000 mg | ORAL_TABLET | Freq: Every day | ORAL | 2 refills | Status: DC

## 2023-02-19 NOTE — Patient Instructions (Addendum)
Medication Instructions:  Your physician recommends that you continue on your current medications as directed. Please refer to the Current Medication list given to you today. *If you need a refill on your cardiac medications before your next appointment, please call your pharmacy*   Lab Work: NONE ORDERED   Testing/Procedures: NONE ORDERED   Follow-Up: At [facility], you and your health needs are our priority.  As part of our continuing mission to provide you with exceptional heart care, we have created designated Provider Care Teams.  These Care Teams include your primary Cardiologist (physician) and Advanced Practice Providers (APPs -  Physician Assistants and Nurse Practitioners) who all work together to provide you with the care you need, when you need it.  We recommend signing up for the patient portal called "MyChart".  Sign up information is provided on this After Visit Summary.  MyChart is used to connect with patients for Virtual Visits (Telemedicine).  Patients are able to view lab/test results, encounter notes, upcoming appointments, etc.  Non-urgent messages can be sent to your provider as well.   To learn more about what you can do with MyChart, go to [email or web address].    Your next appointment:   FIRST AVAILABLE   Provider:   Verne Carrow, MD     Other Instructions

## 2023-02-19 NOTE — Progress Notes (Signed)
Cardiology Office Note:  .   Date:  02/19/2023  ID:  Caitlyn [name], DOB 03-18-1950, MRN [id number] PCP: Caitlyn Back, NP  [facility] Providers Cardiologist:  Caitlyn Carrow, MD {  History of Present Illness: .   Caitlyn [name] is a 72 y.o. female with a past medical history of CAD, hyperlipidemia, hypothyroidism, sleep apnea on CPAP, Graves' disease status post thyroid ablation, PAD, COPD, chronic diastolic CHF, carotid artery disease and HTN here for follow-up appointment.  Had an MI April 2011 and had 2 stents placed in the RCA. She was smoking at that time.   She was seen by me 10/2022, she tells me that she has been having some chest pains and it might be her acid reflux.  She states it comes and goes.  Nonexertional.  Legs are swollen and she is tired a lot.  We discussed updating an echocardiogram.  She sometimes only takes 1 dose of her Demadex because she forgets to take the second dose.  This could be part of the reason she is having swelling in her legs.  Also works all day on her feet and we discussed wearing lower extremity compression.  Gave her handout on elastic therapy.  Also interested in weight loss surgery and we provided her with contacts at [facility] surgery.   Today, she presents with ongoing difficulty losing weight despite dietary modifications. She reports a reduction in soda and sugar intake, and avoidance of "white stuff" like bread. Despite these changes, she has not noticed any significant weight loss. She also mentions issues with sleep, often waking up early in the morning and struggling to fall [name] asleep. She denies daytime napping and reports feeling tired throughout the day.  In addition to these concerns, the patient also reports a problem with her leg, which she believes may be related to vascular issues. She has been told she has some blockage and is scheduled to see a vascular specialist in the coming year. She describes the leg as  painful, but it is unclear whether this is due to the vascular issues or another cause such as arthritis.  The patient is currently on Ozempic, which she understands is beneficial for heart health. She is being switched to [facility] due to a slightly elevated A1c. She expresses some concern about her kidney function, as recent labs showed a slightly elevated creatinine. However, she reports drinking 64 ounces of water daily.  Reports no shortness of breath nor dyspnea on exertion. Reports no chest pain, pressure, or tightness. No edema, orthopnea, PND. Reports no palpitations.   Discussed the use of AI scribe software for clinical note transcription with the patient, who gave verbal consent to proceed.  ROS: Pertinent ROS in HPI  Studies Reviewed: Marland Kitchen       Stress test 09/2022  The study is normal. The study is low risk.   No ST deviation was noted.   LV perfusion is normal. There is no evidence of ischemia. There is no evidence of infarction.   Left ventricular function is normal. Nuclear stress EF: 57%. The left ventricular ejection fraction is normal (55-65%). End diastolic cavity size is normal. End systolic cavity size is normal.   Prior study available for comparison from 01/15/2021.   Normal stress nuclear study with apical thinning but no ischemia or infarction.  Gated ejection fraction 57% with normal wall motion.     Physical Exam:   VS:  BP 104/67   Pulse 67   Ht  5\' 5"  (1.651 m)   Wt 250 lb 9.6 oz (113.7 kg)   SpO2 97%   BMI 41.70 kg/m    Wt Readings from Last 3 Encounters:  02/19/23 250 lb 9.6 oz (113.7 kg)  02/10/23 249 lb 3.2 oz (113 kg)  01/07/23 245 lb (111.1 kg)    GEN: Well nourished, well developed in no acute distress NECK: No JVD; No carotid bruits CARDIAC: RRR, no murmurs, rubs, gallops RESPIRATORY:  Clear to auscultation without rales, wheezing or rhonchi  ABDOMEN: Soft, non-tender, non-distended EXTREMITIES:  No edema; No deformity   ASSESSMENT AND PLAN: .     Hypertension -Well-controlled today, did not tolerate amlodipine -Encouraged her to continue to track at home -Encourage low-sodium, heart healthy diet  HLD -LDL 67, at goal -Will continue Crestor and Zetia  Difficulty Losing Weight   Patient reports difficulty losing weight despite dietary changes. Currently on Ozempic, transitioning to [facility] for weight loss and A1c control.   -Continue with dietary changes and medication regimen.   -Consider consultation with [facility] for further support.    Sleep Disturbance   Patient reports poor sleep quality, waking up early in the morning, and feeling tired throughout the day.   -Try over-the-counter sleep aids such as melatonin or Benadryl 25mg  as needed for sleep.   -Consider lifestyle modifications such as limiting screen time before bed and restricting fluid intake after dinner.    Type 2 Diabetes Mellitus   A1c slightly elevated at 5.9, but improved from previous values. Currently transitioning from Ozempic to [facility].   -Continue Mounjaro for A1c control and weight loss.   -Check A1c in 3 months to assess response to new medication.    Peripheral Vascular Disease /carotid artery disease Patient reports leg pain and previous ultrasound showed some blockage. Plan for further evaluation by Vascular [facility] Vein.   -Continue with planned evaluation by [facility] Ve[facility].   -Consider use of compression stockings from [employer].    Diastolic Heart Failure   Echocardiogram showed slightly impaired relaxation of the heart. Patient is on Torsemide for fluid management.   -Continue Torsemide 20mg  daily, with an additional 20mg  as needed for swelling.   -Consider use of compression stockings from [employer] for additional symptom management.     Dispo: She can follow-up in a few months with Dr. Clifton [name]  Signed, Caitlyn Dory, PA-C

## 2023-03-03 ENCOUNTER — Other Ambulatory Visit: Payer: Self-pay | Admitting: *Deleted

## 2023-03-03 DIAGNOSIS — I6523 Occlusion and stenosis of bilateral carotid arteries: Secondary | ICD-10-CM

## 2023-03-03 DIAGNOSIS — I739 Peripheral vascular disease, unspecified: Secondary | ICD-10-CM

## 2023-03-16 ENCOUNTER — Encounter (HOSPITAL_COMMUNITY): Payer: 59

## 2023-03-16 ENCOUNTER — Ambulatory Visit (HOSPITAL_COMMUNITY): Payer: 59

## 2023-03-23 ENCOUNTER — Ambulatory Visit: Payer: 59

## 2023-04-03 ENCOUNTER — Ambulatory Visit (HOSPITAL_COMMUNITY): Payer: 59

## 2023-04-06 ENCOUNTER — Ambulatory Visit (INDEPENDENT_AMBULATORY_CARE_PROVIDER_SITE_OTHER)
Admission: RE | Admit: 2023-04-06 | Discharge: 2023-04-06 | Disposition: A | Discharge status: Home / Self Care | Payer: 59 | Source: Ambulatory Visit | Attending: Surgery | Admitting: Surgery

## 2023-04-06 ENCOUNTER — Ambulatory Visit (HOSPITAL_COMMUNITY)
Admission: RE | Admit: 2023-04-06 | Discharge: 2023-04-06 | Disposition: A | Discharge status: Home / Self Care | Payer: 59 | Source: Ambulatory Visit | Attending: Surgery | Admitting: Surgery

## 2023-04-06 DIAGNOSIS — I739 Peripheral vascular disease, unspecified: Secondary | ICD-10-CM | POA: Insufficient documentation

## 2023-04-06 DIAGNOSIS — I6523 Occlusion and stenosis of bilateral carotid arteries: Secondary | ICD-10-CM | POA: Insufficient documentation

## 2023-04-06 LAB — VAS US ABI WITH/WO TBI
Left ABI: 0.87
Right ABI: 0.52

## 2023-04-13 ENCOUNTER — Ambulatory Visit (INDEPENDENT_AMBULATORY_CARE_PROVIDER_SITE_OTHER): Payer: 59 | Admitting: Physician Assistant

## 2023-04-13 ENCOUNTER — Encounter: Payer: Self-pay | Admitting: Physician Assistant

## 2023-04-13 VITALS — BP 130/74 | HR 65 | Temp 97.7°F | Resp 18 | Ht 65.0 in | Wt 250.0 lb

## 2023-04-13 DIAGNOSIS — I6523 Occlusion and stenosis of bilateral carotid arteries: Secondary | ICD-10-CM

## 2023-04-13 DIAGNOSIS — I739 Peripheral vascular disease, unspecified: Secondary | ICD-10-CM | POA: Diagnosis not present

## 2023-04-13 DIAGNOSIS — M7989 Other specified soft tissue disorders: Secondary | ICD-10-CM

## 2023-04-13 DIAGNOSIS — I872 Venous insufficiency (chronic) (peripheral): Secondary | ICD-10-CM | POA: Diagnosis not present

## 2023-04-13 NOTE — Progress Notes (Signed)
HISTORY AND PHYSICAL     CC:  follow up. Requesting Provider:  Loura Back, NP  HPI: This is a 73 y.o. female who is here today for follow up and  was pt of Dr. Darrick Penna and has hx of carotid artery stenosis and PAD as well as venous insufficiency.   Pt was last seen on 04/10/2022.  At that time, she was not having any neurological sx or claudication, rest pain or non healing wounds.  She was having some cramping with standing but not necessarily all the time with walking.  The pt returns today for follow up.    Pt denies any amaurosis fugax, speech difficulties, weakness, numbness, paralysis or clumsiness or facial droop.  She does have some left shoulder pain and some numbness in her hand upon waking in the mornings but this resolves.    Pt denies claudication, rest pain that wakes her at night, or non healing wounds.  She states that her legs are achy after being up on them for extended periods of time.  She does have some ankle swelling. She does have hx of DVT in the right leg.  The legs are equal with the achiness.   She states she has a pain on the lateral aspect of the left foot at the heel that has been present for a couple of days.    Pt also has hx of CAD with hx of cardiac stents, hyperlipidemia, hypothyroidism, sleep apnea on CPAP, Graves disease s/p thyroid ablation, COPD, chronic diastolic CHF, and HTN   The pt is on a statin for cholesterol management.    The pt is not on an aspirin.    Other AC:  Plavix The pt is on BB, ARB for hypertension.  The pt is on diabetic medication. Tobacco hx:  former  Pt does not have family hx of AAA.    Past Medical History:  Diagnosis Date   Anxiety    Arthritis    "legs" (01/07/2016)   Back pain    Back pain    CAD 06/2009   a.  s/p MI 4/11: tx with DES x 2 to RCA;  b.  LHC 03/17/11: LAD 30-40%, distal LAD 30-40%, OM2 40-50%, RCA stent patent, EF greater than 70%.;  c.  Lexiscan Myoview (12/15):  Mild apical thinning, no ischemia, EF  61%; normal study   CAROTID STENOSIS    carotid Dopplers 03/25/11: RICA 60-79%; LICA 0-39%.  Follow up recommended in 6 months.   Constipation    Constipation    COPD mixed type (HCC)    Depression    Gout    "on RX for it" (01/07/2016)   Heart disease    History of heart attack    Hx of blood clots    HYPERLIPIDEMIA    HYPERTENSION    HYPOTHYROIDISM    post ablation tx Graves   Hypothyroidism    Joint pain    Myocardial infarction [facility]) 2012   "mini"   OSA on CPAP    Osteoarthritis    Pre-diabetes    PVD    Rheumatoid arthritis (HCC)    Sleep apnea    SOBOE (shortness of breath on exertion)    TOBACCO ABUSE quit 06/2009   Varicose veins    VITAMIN D DEFICIENCY    DEXA 04/2009 normal    Past Surgical History:  Procedure Laterality Date   CARDIAC CATHETERIZATION N/A 01/07/2016   Procedure: Left Heart Cath and Coronary Angiography;  Surgeon: Rinaldo Cloud, MD;  Location: [facility] INVASIVE CV LAB;  Service: Cardiovascular;  Laterality: N/A;   CARDIAC CATHETERIZATION N/A 01/07/2016   Procedure: Coronary Stent Intervention;  Surgeon: Rinaldo Cloud, MD;  Location: [facility] INVASIVE CV LAB;  Service: Cardiovascular;  Laterality: N/A;   CAROTID STENT Left    bilateral carotid artery disease post stent of left carotid   CESAREAN SECTION  1985   "twins"   CORONARY ANGIOPLASTY     LEFT HEART CATHETERIZATION WITH CORONARY ANGIOGRAM N/A 03/17/2011   Procedure: LEFT HEART CATHETERIZATION WITH CORONARY ANGIOGRAM;  Surgeon: Herby Abraham, MD;  Location: [facility] CATH LAB;  Service: Cardiovascular;  Laterality: N/A;   PERIPHERAL VASCULAR CATHETERIZATION N/A 04/06/2015   Procedure: Abdominal Aortogram;  Surgeon: Sherren Kerns, MD;  [facility] IN[facility] CV LAB;  Service: Cardiovascular;  Laterality: N/A;   RADIOACTIVE PLAQUE INSERTION     Graves disease post radioactive treatment complicated hypothyroidism   TOTAL HIP ARTHROPLASTY Left 01/22/2021   Procedure: LEFT TOTAL HIP ARTHROPLASTY ANTERIOR  APPROACH;  Surgeon: Marcene Corning, MD;  Location: [facility] ORS;  Service: Orthopedics;  Laterality: Left;    Allergies  Allergen Reactions   Amlodipine Swelling   Lisinopril Cough   Statins Other (See Comments)    Myalgia    Current Outpatient Medications  Medication Sig Dispense Refill   allopurinol (ZYLOPRIM) 300 MG tablet Take 300 mg by mouth daily.     carvedilol (COREG) 6.25 MG tablet Take 6.25 mg by mouth daily at 6 (six) AM. Pt takes 1 tablet once a day.     Cholecalciferol (VITAMIN D) 50 MCG (2000 UT) tablet Take 1 tablet (2,000 Units total) by mouth daily.     clopidogrel (PLAVIX) 75 MG tablet Take 75 mg by mouth daily.     colchicine 0.6 MG tablet Take 1.2 mg by mouth daily as needed (Gout flare ups).     ezetimibe (ZETIA) 10 MG tablet TAKE 1 TABLET BY MOUTH EVERY DAY 90 tablet 3   levothyroxine (SYNTHROID, LEVOTHROID) 125 MCG tablet Take 125 mcg by mouth daily before breakfast.     LINZESS 145 MCG CAPS capsule Take 1 capsule (145 mcg total) by mouth daily. 30 capsule 11   losartan (COZAAR) 100 MG tablet Take 1 tablet (100 mg total) by mouth daily. 90 tablet 3   MAGNESIUM PO Take 1 tablet by mouth daily.     nitroGLYCERIN (NITROSTAT) 0.4 MG SL tablet Place 1 tablet (0.4 mg total) under the tongue every 5 (five) minutes as needed for chest pain. 25 tablet 3   OZEMPIC, 0.25 OR 0.5 MG/DOSE, 2 MG/3ML SOPN Inject 0.25 mg into the skin once a week.     potassium chloride (KLOR-CON) 10 MEQ tablet Take 1 tablet (10 mEq total) by mouth daily as needed. Taking 1 tablet when taking Lasix (Patient taking differently: Take 10 mEq by mouth daily.) 30 tablet 0   rosuvastatin (CRESTOR) 20 MG tablet Take 20 mg by mouth daily.     Selenium 200 MCG CAPS Take 200 mcg by mouth daily.     torsemide (DEMADEX) 20 MG tablet Take 1 tablet (20 mg total) by mouth daily. Can take an additional 20mg  tablet as needed 45 tablet 2   Vitamin D, Ergocalciferol, (DRISDOL) 1.25 MG (50000 UNIT) CAPS capsule 1 PO Q 5  DAYS 6 capsule 0   zinc gluconate 50 MG tablet Take 50 mg by mouth daily.     No current facility-administered medications for this visit.    Family History  Problem Relation Age  of Onset   Heart attack Mother    Stroke Mother    Cancer Mother    Varicose Veins Mother    Thyroid disease Mother    Hypertension Mother    Heart disease Mother    Sudden death Mother    Depression Mother    Cancer Father    Heart disease Father    Hypertension Father    Coronary artery disease Other    Hypertension Other    Hypertension Sister    Breast cancer Paternal Aunt     Social History   Socioeconomic History   Marital status: Divorced    Spouse name: Not on file   Number of children: Not on file   Years of education: Not on file   Highest education level: Not on file  Occupational History   Occupation: retired, Comptroller for mental health  Tobacco Use   Smoking status: Former    Current packs/day: 0.00    Average packs/day: 1 pack/day for 35.0 years (35.0 ttl pk-yrs)    Types: Cigarettes    Start date: 07/01/1974    Quit date: 06/30/2009    Years since quitting: 13.7   Smokeless tobacco: Never  Vaping Use   Vaping status: Never Used  Substance and Sexual Activity   Alcohol use: Yes    Alcohol/week: 2.0 standard drinks of alcohol    Types: 2 Glasses of wine per week    Comment: occ   Drug use: No   Sexual activity: Not Currently    Comment: Quit smoking after MI- prev 1ppd x 35ys  Other Topics Concern   Not on file  Social History Narrative   Not on file   Social Drivers of Health   Financial Resource Strain: Not on file  Food Insecurity: Not on file  Transportation Needs: Not on file  Physical Activity: Not on file  Stress: Not on file  Social Connections: Not on file  Intimate Partner Violence: Not on file     REVIEW OF SYSTEMS:   [X]  denotes positive finding, [ ]  denotes negative finding Cardiac  Comments:  Chest pain or chest pressure:    Shortness of  breath upon exertion:    Short of breath when lying flat:    Irregular heart rhythm:        Vascular    Pain in calf, thigh, or hip brought on by ambulation:    Pain in feet at night that wakes you up from your sleep:     Blood clot in your veins: x Hx DVT right leg in the past  Leg swelling:  x       Pulmonary    Oxygen at home:    Wheezing:         Neurologic    Sudden weakness in arms or legs:     Sudden numbness in arms or legs:     Sudden onset of difficulty speaking or understanding others    Temporary loss of vision in one eye:     Problems with dizziness:         Gastrointestinal    Blood in stool:     Vomited blood:         Genitourinary    Burning when urinating:     Blood in urine:        Psychiatric    Major depression:         Hematologic    Bleeding problems:    Problems with blood clotting too  easily:        Skin    Rashes or ulcers:        Constitutional    Fever or chills:      PHYSICAL EXAMINATION:  Today's Vitals   04/13/23 0840 04/13/23 0843  BP: 128/77 130/74  Pulse: 65   Resp: 18   Temp: 97.7 F (36.5 C)   TempSrc: Temporal   SpO2: 100%   Weight: 250 lb (113.4 kg)   Height: 5\' 5"  (1.651 m)    Body mass index is 41.6 kg/m.   General:  WDWN in NAD; vital signs documented above Gait: Not observed HENT: WNL, normocephalic Pulmonary: normal non-labored breathing  Cardiac: regular HR;  without carotid bruits Abdomen: obese Skin: without rashes Vascular Exam/Pulses:  Right Left  Radial 2+ (normal) 2+ (normal)  AT monophasic biphasic  PT biphasic biphasic  Peroneal monophasic monophasic   Extremities: bilateral feet are warm.  No ulcerations present.  + varicose veins LLE Musculoskeletal: no muscle wasting or atrophy  Neurologic: A&O X 3;  speech is fluent/normal; moving all extremities equally  Psychiatric:  The pt has Normal affect.   Non-Invasive Vascular Imaging:   ABI's/TBI's on 04/06/2023: Right:  0.52/0.34 - great  toe pressure:  57 Left:  0.87/0.54 - great toe pressure:  90   Carotid Duplex on 04/06/2023: Right:  60-79% ICA stenosis Left:  1-39% ICA stenosis  Previous ABI's/TBI's on 04/10/2022: Right:  0.57/0.33 - great toe pressure:  41 Left:  0.85/0.34 - great toe pressure:  42  Previous Carotid duplex on 04/10/2022: Right: 40-59% ICA stenosis Left:   1-39% ICA stenosis Vertebrals: Bilateral vertebral arteries demonstrate antegrade flow.    ASSESSMENT/PLAN:: 73 y.o. female here for follow up for PAD and carotid stenosis and hx of venous insufficiency.   PAD -ABI's are essentially unchanged.   -pt does not have rest pain, claudication, non healing wounds.   -pt will f/u in one year with ABI.  She knows to call sooner if she develops non healing wounds or rest pain  Venous insufficiency  She does have some achiness in her legs after being up and about.  She has hx of venous insufficiency.  She was measured for mild 15-20 mmHg knee high compression today.  Discussed avoiding prolonged sitting and standing, exercise and water exercises, leg elevation and weight loss.  A handout was given.    -continue graduated walking program -discussed if this continues to be bothersome, we could explore getting a venous reflux duplex in the future but she will start with the conservative measures above.  .    Carotid stenosis -duplex today reveals increase in stenosis on the right to 60-79%  pt is asymptomatic.   -discussed s/s of stroke with pt and she understands should she develop any of these sx, she will go to the nearest ER or call 911. -pt will f/u in 6 months with carotid duplex -discussed with her to continue her statin and plavix, continue with good blood pressure control, healthy diet.  -continue statin/plavix -since Dr. Darrick Penna has retired, she would like her next appt to be with MD to get established.   Doreatha Massed, [facility] [phone]  Clinic MD:    Myra Gianotti

## 2023-04-21 ENCOUNTER — Ambulatory Visit: Payer: 59 | Admitting: Podiatry

## 2023-04-28 ENCOUNTER — Other Ambulatory Visit: Payer: Self-pay

## 2023-04-28 DIAGNOSIS — I6523 Occlusion and stenosis of bilateral carotid arteries: Secondary | ICD-10-CM

## 2023-04-28 DIAGNOSIS — I739 Peripheral vascular disease, unspecified: Secondary | ICD-10-CM

## 2023-05-08 ENCOUNTER — Other Ambulatory Visit: Payer: Self-pay | Admitting: Cardiovascular Disease

## 2023-05-22 ENCOUNTER — Other Ambulatory Visit: Payer: Self-pay | Admitting: Registered Nurse

## 2023-05-22 DIAGNOSIS — Z1231 Encounter for screening mammogram for malignant neoplasm of breast: Secondary | ICD-10-CM

## 2023-06-03 ENCOUNTER — Ambulatory Visit
Admission: RE | Admit: 2023-06-03 | Discharge: 2023-06-03 | Disposition: A | Discharge status: Home / Self Care | Source: Ambulatory Visit | Attending: Registered Nurse

## 2023-06-03 DIAGNOSIS — Z1231 Encounter for screening mammogram for malignant neoplasm of breast: Secondary | ICD-10-CM

## 2023-06-10 ENCOUNTER — Encounter: Payer: Self-pay | Admitting: "Endocrinology

## 2023-06-10 ENCOUNTER — Ambulatory Visit (INDEPENDENT_AMBULATORY_CARE_PROVIDER_SITE_OTHER): Payer: 59 | Admitting: "Endocrinology

## 2023-06-10 VITALS — BP 130/84 | HR 57 | Ht 65.0 in | Wt 249.0 lb

## 2023-06-10 DIAGNOSIS — R7303 Prediabetes: Secondary | ICD-10-CM

## 2023-06-10 DIAGNOSIS — E890 Postprocedural hypothyroidism: Secondary | ICD-10-CM

## 2023-06-10 MED ORDER — DEXAMETHASONE 1 MG PO TABS: 1.0000 mg | ORAL_TABLET | Freq: Once | ORAL | 0 refills | Status: AC

## 2023-06-10 NOTE — Progress Notes (Signed)
 Outpatient Endocrinology Note Caitlyn Polk, MD    Caitlyn [name] 1950-06-07 [id number]  Referring Provider: Loura Back, NP Primary Care Provider: Loura Back, NP Reason for consultation: Subjective   Assessment & Plan  Diagnoses and all orders for this visit:  Postablative hypothyroidism -     TSH(Reflex)  Morbid obesity (HCC) -     Hemoglobin A1c -     Cancel: Lipid panel -     Insulin, random  Prediabetes  Other orders -     dexamethasone (DECADRON) 1 MG tablet; Take 1 tablet (1 mg total) by mouth once for 1 dose. Take at 11 pm followed by blood work next morning at 8 am. Timings are specific.   History of Graves' disease status post radioactive iodine ablation in 1990s at [facility] Currently on levothyroxine 125 mcg p.o. daily, takes appropriately No obstructive symptoms Report notes hypothyroid symptoms Ordered lab  Morbid obesity, complicated by hyperlipidemia Patient interested in bariatric surgery Patient encouraged to speak to a bariatric surgeon to discuss her options Previously on Ozempic without weight loss Now on mounjaro 7.5 mg/week with PCP, will increase to 10 mg mounjaro-no weight loss so far  Ordered baseline labs  Baseline cortisol at 14.5 Ordered 1 mg dexamethasone suppression test History of pre-diabetes, reordered A1C  Return in about 3 months (around 09/10/2023) for visit and 8 am labs before next visit, labs today.   I have reviewed current medications, nurse's notes, allergies, vital signs, past medical and surgical history, family medical history, and social history for this encounter. Counseled patient on symptoms, examination findings, lab findings, imaging results, treatment decisions and monitoring and prognosis. The patient understood the recommendations and agrees with the treatment plan. All questions regarding treatment plan were fully answered.  Caitlyn [place], MD  06/10/23   History of Present Illness HPI  Caitlyn [name] is a 73 y.o. year old female who presents for evaluation of inability to lose weight and postablative hypothyroidism.  C/o inability to lose weight and thyroid issues History of Graves disease s/p RAI treatment in 1990s On levothyroxine 125 mcg every day in morning, takes appropriately  No dysphagia/dysphonia/dyspnea No complaint except for weight   Feels tired, on linzess for constipation, feels cold more than usual   Previously on Ozempic without weight loss Now on mounjaro 7.5 mg/week, will increase to 10 mg mounjaro  Patient doesn't associate her weight to any medications   Physical Exam  BP 130/84   Pulse (!) 57   Ht 5\' 5"  (1.651 m)   Wt 249 lb (112.9 kg)   SpO2 99%   BMI 41.44 kg/m    Constitutional: well developed, well nourished Head: normocephalic, atraumatic Eyes: sclera anicteric, no redness Neck: supple Lungs: normal respiratory effort Neurology: alert and oriented Skin: dry, no appreciable rashes Musculoskeletal: no appreciable defects Psychiatric: normal mood and affect   Current Medications Patient's Medications  New Prescriptions   DEXAMETHASONE (DECADRON) 1 MG TABLET    Take 1 tablet (1 mg total) by mouth once for 1 dose. Take at 11 pm followed by blood work next morning at 8 am. Timings are specific.  Previous Medications   ALLOPURINOL (ZYLOPRIM) 300 MG TABLET    Take 300 mg by mouth daily.   CARVEDILOL (COREG) 6.25 MG TABLET    Take 6.25 mg by mouth daily at 6 (six) AM. Pt takes 1 tablet once a day.   CHOLECALCIFEROL (VITAMIN D) 50 MCG (2000 UT) TABLET    Take 1 tablet (  2,000 Units total) by mouth daily.   CLOPIDOGREL (PLAVIX) 75 MG TABLET    Take 75 mg by mouth daily.   COLCHICINE 0.6 MG TABLET    Take 1.2 mg by mouth daily as needed (Gout flare ups).   EZETIMIBE (ZETIA) 10 MG TABLET    TAKE 1 TABLET BY MOUTH EVERY DAY   LEVOTHYROXINE (SYNTHROID, LEVOTHROID) 125 MCG TABLET    Take 125 mcg by mouth daily before breakfast.   LINZESS 145 MCG CAPS  CAPSULE    Take 1 capsule (145 mcg total) by mouth daily.   LOSARTAN (COZAAR) 100 MG TABLET    Take 1 tablet (100 mg total) by mouth daily.   MAGNESIUM PO    Take 1 tablet by mouth daily.   NITROGLYCERIN (NITROSTAT) 0.4 MG SL TABLET    Place 1 tablet (0.4 mg total) under the tongue every 5 (five) minutes as needed for chest pain.   POTASSIUM CHLORIDE (KLOR-CON) 10 MEQ TABLET    Take 1 tablet (10 mEq total) by mouth daily as needed. Taking 1 tablet when taking Lasix   ROSUVASTATIN (CRESTOR) 20 MG TABLET    Take 20 mg by mouth daily.   SELENIUM 200 MCG CAPS    Take 200 mcg by mouth daily.   TORSEMIDE (DEMADEX) 20 MG TABLET    Take 1 tablet (20 mg total) by mouth daily. Can take an additional 20mg  tablet as needed   VITAMIN D, ERGOCALCIFEROL, (DRISDOL) 1.25 MG (50000 UNIT) CAPS CAPSULE    1 PO Q 5 DAYS   ZINC GLUCONATE 50 MG TABLET    Take 50 mg by mouth daily.  Modified Medications   No medications on file  Discontinued Medications   OZEMPIC, 0.25 OR 0.5 MG/DOSE, 2 MG/3ML SOPN    Inject 0.25 mg into the skin once a week.    Allergies Allergies  Allergen Reactions   Amlodipine Swelling   Lisinopril Cough   Statins Other (See Comments)    Myalgia    Past Medical History Past Medical History:  Diagnosis Date   Anxiety    Arthritis    "legs" (01/07/2016)   Back pain    Back pain    CAD 06/2009   a.  s/p MI 4/11: tx with DES x 2 to RCA;  b.  LHC 03/17/11: LAD 30-40%, distal LAD 30-40%, OM2 40-50%, RCA stent patent, EF greater than 70%.;  c.  Lexiscan Myoview (12/15):  Mild apical thinning, no ischemia, EF 61%; normal study   CAROTID STENOSIS    carotid Dopplers 03/25/11: RICA 60-79%; LICA 0-39%.  Follow up recommended in 6 months.   Constipation    Constipation    COPD mixed type (HCC)    Depression    Gout    "on RX for it" (01/07/2016)   Heart disease    History of heart attack    Hx of blood clots    HYPERLIPIDEMIA    HYPERTENSION    HYPOTHYROIDISM    post ablation tx  Graves   Hypothyroidism    Joint pain    Myocardial infarction [facility] 2012   "mini"   OSA on CPAP    Osteoarthritis    Pre-diabetes    PVD    Rheumatoid arthritis (HCC)    Sleep apnea    SOBOE (shortness of breath on exertion)    TOBACCO ABUSE quit 06/2009   Varicose veins    VITAMIN D DEFICIENCY    DEXA 04/2009 normal    Past Surgical  History Past Surgical History:  Procedure Laterality Date   CARDIAC CATHETERIZATION N/A 01/07/2016   Procedure: Left Heart Cath and Coronary Angiography;  Surgeon: Rinaldo Cloud, MD;  Location: [facility] INVASIVE CV LAB;  Service: Cardiovascular;  Laterality: N/A;   CARDIAC CATHETERIZATION N/A 01/07/2016   Procedure: Coronary Stent Intervention;  Surgeon: Rinaldo Cloud, MD;  Location: [facility] INVASIVE CV LAB;  Service: Cardiovascular;  Laterality: N/A;   CAROTID STENT Left    bilateral carotid artery disease post stent of left carotid   CESAREAN SECTION  1985   "twins"   CORONARY ANGIOPLASTY     LEFT HEART CATHETERIZATION WITH CORONARY ANGIOGRAM N/A 03/17/2011   Procedure: LEFT HEART CATHETERIZATION WITH CORONARY ANGIOGRAM;  Surgeon: Herby Abraham, MD;  Location[facility] CATH LAB;  Service: Cardiovascular;  Laterality: N/A;   PERIPHERAL VASCULAR CATHETERIZATION N/A 04/06/2015   Procedure: Abdominal Aortogram;  Surgeon: Sherren Kerns, MD;  Location: [facility] INVASIVE CV LAB;  Service: Cardiovascular;  Laterality: N/A;   RADIOACTIVE PLAQUE INSERTION     Graves disease post radioactive treatment complicated hypothyroidism   TOTAL HIP ARTHROPLASTY Left 01/22/2021   Procedure: LEFT TOTAL HIP ARTHROPLASTY ANTERIOR APPROACH;  Surgeon: Marcene Corning, MD;  Location: [facility] ORS;  Service: Orthopedics;  Laterality: Left;    Family History family history includes Breast cancer in her paternal aunt; Cancer in her father and mother; Coronary artery disease in an other family member; Depression in her mother; Heart attack in her mother; Heart disease in her father and mother;  Hypertension in her father, mother, sister, and another family member; Stroke in her mother; Sudden death in her mother; Thyroid disease in her mother; Varicose Veins in her mother.  Social History Social History   Socioeconomic History   Marital status: Divorced    Spouse name: Not on file   Number of children: Not on file   Years of education: Not on file   Highest education level: Not on file  Occupational History   Occupation: retired, Comptroller for mental health  Tobacco Use   Smoking status: Former    Current packs/day: 0.00    Average packs/day: 1 pack/day for 35.0 years (35.0 ttl pk-yrs)    Types: Cigarettes    Start date: 07/01/1974    Quit date: 06/30/2009    Years since quitting: 13.9   Smokeless tobacco: Never  Vaping Use   Vaping status: Never Used  Substance and Sexual Activity   Alcohol use: Yes    Alcohol/week: 2.0 standard drinks of alcohol    Types: 2 Glasses of wine per week    Comment: occ   Drug use: No   Sexual activity: Not Currently    Comment: Quit smoking after MI- prev 1ppd x 35ys  Other Topics Concern   Not on file  Social History Narrative   Not on file   Social Drivers of Health   Financial Resource Strain: Not on file  Food Insecurity: Not on file  Transportation Needs: Not on file  Physical Activity: Not on file  Stress: Not on file  Social Connections: Not on file  Intimate Partner Violence: Not on file    Lab Results  Component Value Date   CHOL 142 11/05/2021   Lab Results  Component Value Date   HDL 56 11/05/2021   Lab Results  Component Value Date   LDLCALC 67 11/05/2021   Lab Results  Component Value Date   TRIG 104 11/05/2021   Lab Results  Component Value Date   CHOLHDL 2.5  11/05/2021   Lab Results  Component Value Date   CREATININE 1.04 (H) 02/10/2023   Lab Results  Component Value Date   GFR 70.54 03/21/2021      Component Value Date/Time   NA 141 02/10/2023 1051   NA 140 11/14/2022 1620   K 4.2  02/10/2023 1051   CL 101 02/10/2023 1051   CO2 31 02/10/2023 1051   GLUCOSE 83 02/10/2023 1051   BUN 26 (H) 02/10/2023 1051   BUN 22 11/14/2022 1620   CREATININE 1.04 (H) 02/10/2023 1051   CALCIUM 9.7 02/10/2023 1051   PROT 7.2 02/10/2023 1051   PROT 6.8 11/05/2021 0846   ALBUMIN 4.2 11/05/2021 0846   AST 31 02/10/2023 1051   ALT 29 02/10/2023 1051   ALKPHOS 117 11/05/2021 0846   BILITOT 0.4 02/10/2023 1051   BILITOT 0.3 11/05/2021 0846   GFRNONAA >60 01/16/2021 1449   GFRAA 79 05/03/2020 0933      Latest Ref Rng & Units 02/10/2023   10:51 AM 11/14/2022    4:20 PM 05/10/2021    1:32 PM  BMP  Glucose 65 - 99 mg/dL 83  93  83   BUN 7 - 25 mg/dL 26  22  19    Creatinine 0.60 - 1.00 mg/dL 4.09  8.11  9.14   BUN/Creat Ratio 6 - 22 (calc) 25  23  23    Sodium 135 - 146 mmol/L 141  140  140   Potassium 3.5 - 5.3 mmol/L 4.2  4.7  4.2   Chloride 98 - 110 mmol/L 101  103  104   CO2 20 - 32 mmol/L 31  24  23    Calcium 8.6 - 10.4 mg/dL 9.7  9.3  9.3        Component Value Date/Time   WBC 5.4 03/21/2021 1220   RBC 4.47 03/21/2021 1220   HGB 13.7 03/21/2021 1220   HGB 14.4 05/03/2020 0933   HCT 42.0 03/21/2021 1220   HCT 44.6 05/03/2020 0933   PLT 214.0 03/21/2021 1220   PLT 232 05/03/2020 0933   MCV 93.9 03/21/2021 1220   MCV 93 05/03/2020 0933   MCH 31.9 01/16/2021 1449   MCHC 32.6 03/21/2021 1220   RDW 14.5 03/21/2021 1220   RDW 13.4 05/03/2020 0933   LYMPHSABS 1.2 03/21/2021 1220   LYMPHSABS 1.6 05/03/2020 0933   MONOABS 0.3 03/21/2021 1220   EOSABS 0.1 03/21/2021 1220   EOSABS 0.2 05/03/2020 0933   BASOSABS 0.0 03/21/2021 1220   BASOSABS 0.0 05/03/2020 0933   Lab Results  Component Value Date   TSH 2.24 02/10/2023   TSH 1.080 11/21/2020   TSH 0.716 05/03/2020   FREET4 1.6 02/10/2023   FREET4 1.57 11/21/2020   FREET4 1.91 (H) 05/03/2020         Parts of this note may have been dictated using voice recognition software. There may be variances in spelling and  vocabulary which are unintentional. Not all errors are proofread. Please notify the [facility] if any discrepancies are noted or if the meaning of any statement is not clear.

## 2023-06-11 LAB — HEMOGLOBIN A1C
Hgb A1c MFr Bld: 5.7 %{Hb} — ABNORMAL HIGH (ref <5.7)
Mean Plasma Glucose: 117 mg/dL
eAG (mmol/L): 6.5 mmol/L

## 2023-06-11 LAB — TSH(REFL): TSH: 1.01 m[IU]/L (ref 0.40–4.50)

## 2023-06-11 LAB — INSULIN, RANDOM: Insulin: 19.2 u[IU]/mL — ABNORMAL HIGH

## 2023-06-11 LAB — REFLEX TIQ

## 2023-06-26 DIAGNOSIS — M7989 Other specified soft tissue disorders: Secondary | ICD-10-CM

## 2023-07-01 ENCOUNTER — Other Ambulatory Visit: Payer: Medicare (Managed Care)

## 2023-07-01 ENCOUNTER — Other Ambulatory Visit: Payer: Self-pay

## 2023-07-01 DIAGNOSIS — R635 Abnormal weight gain: Secondary | ICD-10-CM

## 2023-07-01 DIAGNOSIS — I739 Peripheral vascular disease, unspecified: Secondary | ICD-10-CM

## 2023-07-01 DIAGNOSIS — E890 Postprocedural hypothyroidism: Secondary | ICD-10-CM

## 2023-07-01 DIAGNOSIS — E1165 Type 2 diabetes mellitus with hyperglycemia: Secondary | ICD-10-CM

## 2023-07-02 LAB — LIPID PANEL
Cholesterol: 132 mg/dL (ref <200)
HDL: 59 mg/dL (ref >=50)
LDL Cholesterol (Calc): 59 mg/dL
Non-HDL Cholesterol (Calc): 73 mg/dL (ref <130)
Total CHOL/HDL Ratio: 2.2 (calc) (ref <5.0)
Triglycerides: 61 mg/dL (ref <150)

## 2023-07-02 LAB — MICROALBUMIN / CREATININE URINE RATIO
Creatinine, Urine: 110 mg/dL (ref 20–275)
Microalb Creat Ratio: 15 mg/g{creat} (ref <30)
Microalb, Ur: 1.7 mg/dL

## 2023-07-07 ENCOUNTER — Other Ambulatory Visit: Payer: Self-pay | Admitting: "Endocrinology

## 2023-07-07 DIAGNOSIS — E890 Postprocedural hypothyroidism: Secondary | ICD-10-CM

## 2023-07-07 DIAGNOSIS — R635 Abnormal weight gain: Secondary | ICD-10-CM

## 2023-07-08 ENCOUNTER — Ambulatory Visit: Payer: Medicare (Managed Care) | Admitting: "Endocrinology

## 2023-07-08 ENCOUNTER — Encounter: Payer: Self-pay | Admitting: "Endocrinology

## 2023-07-08 VITALS — BP 130/80 | HR 61 | Ht 65.0 in | Wt 251.0 lb

## 2023-07-08 DIAGNOSIS — R7303 Prediabetes: Secondary | ICD-10-CM | POA: Diagnosis not present

## 2023-07-08 DIAGNOSIS — E890 Postprocedural hypothyroidism: Secondary | ICD-10-CM | POA: Diagnosis not present

## 2023-07-08 NOTE — Progress Notes (Signed)
 Outpatient Endocrinology Note Jorge Newcomer, MD    Caitlyn [name] 1951-02-23 [id number]  Referring Provider: Hershell Lose, NP Primary Care Provider: Hershell Lose, NP Reason for consultation: Subjective   Assessment & Plan  Diagnoses and all orders for this visit:  Postablative hypothyroidism  Morbid obesity (HCC) -     TSH  Prediabetes   History of Graves' disease status post radioactive iodine  ablation in 1990s at [facility] Currently on levothyroxine  125 mcg p.o. daily, takes appropriately No obstructive symptoms Report notes hypothyroid symptoms 06/10/23: TSH WNL Continue current dose  Morbid obesity, complicated by hyperlipidemia, prediabetes Previously on Ozempic /Mounjaro  without weight loss 07/08/23: Stopping mounjaro 7.5 mg/week due to no weight loss Not a candidate for phentermine based medications due to history of heart attacks Patient questions about safety of bariatric surgery, given option to refer to bariatric surgery to see if it would be a safe approach  Baseline cortisol at 14.5 History of pre-diabetes  Return in about 4 months (around 11/07/2023).   I have reviewed current medications, nurse's notes, allergies, vital signs, past medical and surgical history, family medical history, and social history for this encounter. Counseled patient on symptoms, examination findings, lab findings, imaging results, treatment decisions and monitoring and prognosis. The patient understood the recommendations and agrees with the treatment plan. All questions regarding treatment plan were fully answered.  Jorge Newcomer, MD  07/08/23   History of Present Illness HPI  Caitlyn [name] is a 73 y.o. year old female who presents for evaluation of inability to lose weight and postablative hypothyroidism.  C/o inability to lose weight and thyroid  issues History of Graves disease s/p RAI treatment in 1990s On levothyroxine  125 mcg every day in morning, takes  appropriately  No dysphagia/dysphonia/dyspnea No complaint except for weight   History of sleep apnea, has the dental device but does not use it regularly Feels tired, on linzess  for constipation, feels cold more than usual   Previously on Ozempic  without weight loss Now on mounjaro 7.5 mg/week without weight loss Patient doesn't associate her weight to any medications   Physical Exam  BP 130/80   Pulse 61   Ht 5\' 5"  (1.651 m)   Wt 251 lb (113.9 kg)   SpO2 94%   BMI 41.77 kg/m    Constitutional: well developed, well nourished Head: normocephalic, atraumatic Eyes: sclera anicteric, no redness Neck: supple Lungs: normal respiratory effort Neurology: alert and oriented Skin: dry, no appreciable rashes Musculoskeletal: no appreciable defects Psychiatric: normal mood and affect   Current Medications Patient's Medications  New Prescriptions   No medications on file  Previous Medications   ALLOPURINOL  (ZYLOPRIM ) 300 MG TABLET    Take 300 mg by mouth daily.   CARVEDILOL  (COREG ) 6.25 MG TABLET    Take 6.25 mg by mouth daily at 6 (six) AM. Pt takes 1 tablet once a day.   CHOLECALCIFEROL (VITAMIN D ) 50 MCG (2000 UT) TABLET    Take 1 tablet (2,000 Units total) by mouth daily.   CLOPIDOGREL  (PLAVIX ) 75 MG TABLET    Take 75 mg by mouth daily.   COLCHICINE  0.6 MG TABLET    Take 1.2 mg by mouth daily as needed (Gout flare ups).   EZETIMIBE  (ZETIA ) 10 MG TABLET    TAKE 1 TABLET BY MOUTH EVERY DAY   LEVOTHYROXINE  (SYNTHROID , LEVOTHROID) 125 MCG TABLET    Take 125 mcg by mouth daily before breakfast.   LINZESS  145 MCG CAPS CAPSULE    Take 1 capsule (  145 mcg total) by mouth daily.   LOSARTAN  (COZAAR ) 100 MG TABLET    Take 1 tablet (100 mg total) by mouth daily.   MAGNESIUM PO    Take 1 tablet by mouth daily.   NITROGLYCERIN  (NITROSTAT ) 0.4 MG SL TABLET    Place 1 tablet (0.4 mg total) under the tongue every 5 (five) minutes as needed for chest pain.   POTASSIUM CHLORIDE  (KLOR-CON ) 10 MEQ  TABLET    Take 1 tablet (10 mEq total) by mouth daily as needed. Taking 1 tablet when taking Lasix    ROSUVASTATIN  (CRESTOR ) 20 MG TABLET    Take 20 mg by mouth daily.   SELENIUM  200 MCG CAPS    Take 200 mcg by mouth daily.   TORSEMIDE  (DEMADEX ) 20 MG TABLET    Take 1 tablet (20 mg total) by mouth daily. Can take an additional 20mg  tablet as needed   VITAMIN D , ERGOCALCIFEROL , (DRISDOL ) 1.25 MG (50000 UNIT) CAPS CAPSULE    1 PO Q 5 DAYS   ZINC GLUCONATE 50 MG TABLET    Take 50 mg by mouth daily.  Modified Medications   No medications on file  Discontinued Medications   No medications on file    Allergies Allergies  Allergen Reactions   Amlodipine  Swelling   Lisinopril  Cough   Statins Other (See Comments)    Myalgia    Past Medical History Past Medical History:  Diagnosis Date   Anxiety    Arthritis    "legs" (01/07/2016)   Back pain    Back pain    CAD 06/2009   a.  s/p MI 4/11: tx with DES x 2 to RCA;  b.  LHC 03/17/11: LAD 30-40%, distal LAD 30-40%, OM2 40-50%, RCA stent patent, EF greater than 70%.;  c.  Lexiscan  Myoview  (12/15):  Mild apical thinning, no ischemia, EF 61%; normal study   CAROTID STENOSIS    carotid Dopplers 03/25/11: RICA 60-79%; LICA 0-39%.  Follow up recommended in 6 months.   Constipation    Constipation    COPD mixed type (HCC)    Depression    Gout    "on RX for it" (01/07/2016)   Heart disease    History of heart attack    Hx of blood clots    HYPERLIPIDEMIA    HYPERTENSION    HYPOTHYROIDISM    post ablation tx Graves   Hypothyroidism    Joint pain    Myocardial infarction [facility]) 2012   "mini"   OSA on CPAP    Osteoarthritis    Pre-diabetes    PVD    Rheumatoid arthritis (HCC)    Sleep apnea    SOBOE (shortness of breath on exertion)    TOBACCO ABUSE quit 06/2009   Varicose veins    VITAMIN D  DEFICIENCY    DEXA 04/2009 normal    Past Surgical History Past Surgical History:  Procedure Laterality Date   CARDIAC CATHETERIZATION N/A  01/07/2016   Procedure: Left Heart Cath and Coronary Angiography;  Surgeon: Chapman Commodore, MD;  Location: [facility] INVASIVE CV LAB;  Service: Cardiovascular;  Laterality: N/A;   CARDIAC CATHETERIZATION N/A 01/07/2016   Procedure: Coronary Stent Intervention;  Surgeon: Chapman Commodore, MD;  Location: [facility] INVASIVE CV LAB;  Service: Cardiovascular;  Laterality: N/A;   CAROTID STENT Left    bilateral carotid artery disease post stent of left carotid   CESAREAN SECTION  1985   "twins"   CORONARY ANGIOPLASTY     LEFT HEART CATHETERIZATION WITH  CORONARY ANGIOGRAM N/A 03/17/2011   Procedure: LEFT HEART CATHETERIZATION WITH CORONARY ANGIOGRAM;  Surgeon: Kristopher Pheasant, MD;  Location: [facility] CATH LAB;  Service: Cardiovascular;  Laterality: N/A;   PERIPHERAL VASCULAR CATHETERIZATION N/A 04/06/2015   Procedure: Abdominal Aortogram;  Surgeon: Richrd Char, MD;  Location: [facility] INVASIVE CV LAB;  Service: Cardiovascular;  Laterality: N/A;   RADIOACTIVE PLAQUE INSERTION     Graves disease post radioactive treatment complicated hypothyroidism   TOTAL HIP ARTHROPLASTY Left 01/22/2021   Procedure: LEFT TOTAL HIP ARTHROPLASTY ANTERIOR APPROACH;  Surgeon: Dayne Even, MD;  Location: [facility] ORS;  Service: Orthopedics;  Laterality: Left;    Family History family history includes Breast cancer in her paternal aunt; Cancer in her father and mother; Coronary artery disease in an other family member; Depression in her mother; Heart attack in her mother; Heart disease in her father and mother; Hypertension in her father, mother, sister, and another family member; Stroke in her mother; Sudden death in her mother; Thyroid  disease in her mother; Varicose Veins in her mother.  Social History Social History   Socioeconomic History   Marital status: Divorced    Spouse name: Not on file   Number of children: Not on file   Years of education: Not on file   Highest education level: Not on file  Occupational History   Occupation:  retired, Comptroller for mental health  Tobacco Use   Smoking status: Former    Current packs/day: 0.00    Average packs/day: 1 pack/day for 35.0 years (35.0 ttl pk-yrs)    Types: Cigarettes    Start date: 07/01/1974    Quit date: 06/30/2009    Years since quitting: 14.0   Smokeless tobacco: Never  Vaping Use   Vaping status: Never Used  Substance and Sexual Activity   Alcohol use: Yes    Alcohol/week: 2.0 standard drinks of alcohol    Types: 2 Glasses of wine per week    Comment: occ   Drug use: No   Sexual activity: Not Currently    Comment: Quit smoking after MI- prev 1ppd x 35ys  Other Topics Concern   Not on file  Social History Narrative   Not on file   Social Drivers of Health   Financial Resource Strain: Not on file  Food Insecurity: Not on file  Transportation Needs: Not on file  Physical Activity: Not on file  Stress: Not on file  Social Connections: Not on file  Intimate Partner Violence: Not on file    Lab Results  Component Value Date   CHOL 132 07/01/2023   Lab Results  Component Value Date   HDL 59 07/01/2023   Lab Results  Component Value Date   LDLCALC 59 07/01/2023   Lab Results  Component Value Date   TRIG 61 07/01/2023   Lab Results  Component Value Date   CHOLHDL 2.2 07/01/2023   Lab Results  Component Value Date   CREATININE 1.04 (H) 02/10/2023   Lab Results  Component Value Date   GFR 70.54 03/21/2021      Component Value Date/Time   NA 141 02/10/2023 1051   NA 140 11/14/2022 1620   K 4.2 02/10/2023 1051   CL 101 02/10/2023 1051   CO2 31 02/10/2023 1051   GLUCOSE 83 02/10/2023 1051   BUN 26 (H) 02/10/2023 1051   BUN 22 11/14/2022 1620   CREATININE 1.04 (H) 02/10/2023 1051   CALCIUM  9.7 02/10/2023 1051   PROT 7.2 02/10/2023 1051   PROT  6.8 11/05/2021 0846   ALBUMIN 4.2 11/05/2021 0846   AST 31 02/10/2023 1051   ALT 29 02/10/2023 1051   ALKPHOS 117 11/05/2021 0846   BILITOT 0.4 02/10/2023 1051   BILITOT 0.3 11/05/2021  0846   GFRNONAA >60 01/16/2021 1449   GFRAA 79 05/03/2020 0933      Latest Ref Rng & Units 02/10/2023   10:51 AM 11/14/2022    4:20 PM 05/10/2021    1:32 PM  BMP  Glucose 65 - 99 mg/dL 83  93  83   BUN 7 - 25 mg/dL 26  22  19    Creatinine 0.60 - 1.00 mg/dL 3.08  6.57  8.46   BUN/Creat Ratio 6 - 22 (calc) 25  23  23    Sodium 135 - 146 mmol/L 141  140  140   Potassium 3.5 - 5.3 mmol/L 4.2  4.7  4.2   Chloride 98 - 110 mmol/L 101  103  104   CO2 20 - 32 mmol/L 31  24  23    Calcium  8.6 - 10.4 mg/dL 9.7  9.3  9.3        Component Value Date/Time   WBC 5.4 03/21/2021 1220   RBC 4.47 03/21/2021 1220   HGB 13.7 03/21/2021 1220   HGB 14.4 05/03/2020 0933   HCT 42.0 03/21/2021 1220   HCT 44.6 05/03/2020 0933   PLT 214.0 03/21/2021 1220   PLT 232 05/03/2020 0933   MCV 93.9 03/21/2021 1220   MCV 93 05/03/2020 0933   MCH 31.9 01/16/2021 1449   MCHC 32.6 03/21/2021 1220   RDW 14.5 03/21/2021 1220   RDW 13.4 05/03/2020 0933   LYMPHSABS 1.2 03/21/2021 1220   LYMPHSABS 1.6 05/03/2020 0933   MONOABS 0.3 03/21/2021 1220   EOSABS 0.1 03/21/2021 1220   EOSABS 0.2 05/03/2020 0933   BASOSABS 0.0 03/21/2021 1220   BASOSABS 0.0 05/03/2020 0933   Lab Results  Component Value Date   TSH 2.24 02/10/2023   TSH 1.080 11/21/2020   TSH 0.716 05/03/2020   FREET4 1.6 02/10/2023   FREET4 1.57 11/21/2020   FREET4 1.91 (H) 05/03/2020         Parts of this note may have been dictated using voice recognition software. There may be variances in spelling and vocabulary which are unintentional. Not all errors are proofread. Please notify the [facility] if any discrepancies are noted or if the meaning of any statement is not clear.

## 2023-07-31 LAB — GLUCOSE, POCT (MANUAL RESULT ENTRY): Glucose Fasting, POC: 103 mg/dL — AB (ref 70–99)

## 2023-07-31 LAB — HEMOGLOBIN A1C: Hemoglobin A1C: 5.6

## 2023-07-31 NOTE — Progress Notes (Signed)
 Patient came to mobile screening at [facility]. Pt is currently taking BP medications and living an active lifestyle for weight loss management. BP 121/72 and fasting glucose 103. A1C 5.6. We discussed continuous healthy life maintenance. Pt indicated no SDOH needs.

## 2023-08-05 ENCOUNTER — Other Ambulatory Visit: Payer: Self-pay | Admitting: Physician Assistant

## 2023-09-08 NOTE — Progress Notes (Signed)
 The patient attended a screening event on 07/31/23 where her bp screening results was 121/72 and her blood glucose was 103. At the event the patient noted she did not have any sdoh insecurities. Patient did not document anything else.   Per chart review pt does have insurance, a pcp and does not smoke. The last ov with primary care team was 05/22/23. Chart review also indicates six future specialty appts. Chart review revealed that prediabetes is listed in the pt's problem list and the pt is on BP meds. The pt is consistent with care. Will mail prediabetes and hypertension resources as a curtesy.  No additional Health equity team support indicated at this time.

## 2023-10-16 ENCOUNTER — Ambulatory Visit: Payer: 59 | Admitting: Vascular Surgery

## 2023-10-16 ENCOUNTER — Encounter (HOSPITAL_COMMUNITY): Payer: 59

## 2023-10-19 ENCOUNTER — Ambulatory Visit: Payer: 59 | Admitting: Surgery

## 2023-10-19 ENCOUNTER — Encounter (HOSPITAL_COMMUNITY): Payer: 59

## 2023-11-02 ENCOUNTER — Ambulatory Visit: Payer: Medicare (Managed Care) | Admitting: Surgery

## 2023-11-02 ENCOUNTER — Ambulatory Visit (HOSPITAL_COMMUNITY): Payer: Medicare (Managed Care)

## 2023-11-04 ENCOUNTER — Ambulatory Visit (HOSPITAL_COMMUNITY)
Admission: RE | Admit: 2023-11-04 | Discharge: 2023-11-04 | Disposition: A | Discharge status: Home / Self Care | Payer: Medicare (Managed Care) | Source: Ambulatory Visit | Attending: Surgery | Admitting: Surgery

## 2023-11-04 DIAGNOSIS — I6523 Occlusion and stenosis of bilateral carotid arteries: Secondary | ICD-10-CM | POA: Diagnosis present

## 2023-12-28 ENCOUNTER — Ambulatory Visit: Payer: Medicare (Managed Care) | Admitting: Surgery

## 2024-01-04 ENCOUNTER — Ambulatory Visit: Payer: Medicare (Managed Care) | Admitting: Surgery

## 2024-01-25 ENCOUNTER — Ambulatory Visit: Payer: Medicare (Managed Care) | Attending: Surgery | Admitting: Surgery

## 2024-01-25 NOTE — Progress Notes (Deleted)
 [facility] [place]  Patient name: Caitlyn [name] MRN: [id number] DOB: 1950/08/13 Sex: female   REASON FOR VISIT:    Follow up  HISOTRY OF PRESENT ILLNESS:    Caitlyn [name] is a 73 y.o. female who is a former patient of Dr. Harvey who he was following for carotid disease, lower extremity PAD as well as venous insufficiency.  The patient underwent angiography in 2017 and was found to have a 70% narrowing at the origin of the right superficial femoral artery that was very focal.  No interventions were performed.  Her ABIs earlier this year were 0.5 on the right and 0.87 on the left.  She has known carotid stenosis, 60 to 79% on the right and 1 to 39% on the left.  She has remained asymptomatic.  With regards to her leg swelling, at her last visit she was given 15-20 compression stockings and encouraged to elevate her legs, engage in exercise and keep her legs elevated.   PAST MEDICAL HISTORY:   Past Medical History:  Diagnosis Date   Anxiety    Arthritis    legs (01/07/2016)   Back pain    Back pain    CAD 06/2009   a.  s/p MI 4/11: tx with DES x 2 to RCA;  b.  LHC 03/17/11: LAD 30-40%, distal LAD 30-40%, OM2 40-50%, RCA stent patent, EF greater than 70%.;  c.  Lexiscan  Myoview  (12/15):  Mild apical thinning, no ischemia, EF 61%; normal study   CAROTID STENOSIS    carotid Dopplers 03/25/11: RICA 60-79%; LICA 0-39%.  Follow up recommended in 6 months.   Constipation    Constipation    COPD mixed type (HCC)    Depression    Gout    on RX for it (01/07/2016)   Heart disease    History of heart attack    Hx of blood clots    HYPERLIPIDEMIA    HYPERTENSION    HYPOTHYROIDISM    post ablation tx Graves   Hypothyroidism    Joint pain    Myocardial infarction (HCC) 2012   mini   OSA on CPAP    Osteoarthritis    Pre-diabetes    PVD    Rheumatoid arthritis (HCC)    Sleep apnea    SOBOE (shortness of breath  on exertion)    TOBACCO ABUSE quit 06/2009   Varicose veins    VITAMIN D  DEFICIENCY    DEXA 04/2009 normal     FAMILY HISTORY:   Family History  Problem Relation Age of Onset   Heart attack Mother    Stroke Mother    Cancer Mother    Varicose Veins Mother    Thyroid  disease Mother    Hypertension Mother    Heart disease Mother    Sudden death Mother    Depression Mother    Cancer Father    Heart disease Father    Hypertension Father    Coronary artery disease Other    Hypertension Other    Hypertension Sister    Breast cancer Paternal Aunt     SOCIAL HISTORY:   Social History   Tobacco Use   Smoking status: Former    Current packs/day: 0.00    Average packs/day: 1 pack/day for 35.0 years (35.0 ttl pk-yrs)    Types: Cigarettes    Start date: 07/01/1974    Quit date: 06/30/2009    Years since quitting: 14.5   Smokeless tobacco: Never  Substance Use Topics   Alcohol use: Yes    Alcohol/week: 2.0 standard drinks of alcohol    Types: 2 Glasses of wine per week    Comment: occ     ALLERGIES:   Allergies  Allergen Reactions   Amlodipine  Swelling   Lisinopril  Cough   Statins Other (See Comments)    Myalgia     CURRENT MEDICATIONS:   Current Outpatient Medications  Medication Sig Dispense Refill   allopurinol  (ZYLOPRIM ) 300 MG tablet Take 300 mg by mouth daily.     carvedilol  (COREG ) 6.25 MG tablet Take 6.25 mg by mouth daily at 6 (six) AM. Pt takes 1 tablet once a day.     Cholecalciferol (VITAMIN D ) 50 MCG (2000 UT) tablet Take 1 tablet (2,000 Units total) by mouth daily.     clopidogrel  (PLAVIX ) 75 MG tablet Take 75 mg by mouth daily.     colchicine  0.6 MG tablet Take 1.2 mg by mouth daily as needed (Gout flare ups).     ezetimibe  (ZETIA ) 10 MG tablet TAKE 1 TABLET BY MOUTH EVERY DAY 90 tablet 3   levothyroxine  (SYNTHROID , LEVOTHROID) 125 MCG tablet Take 125 mcg by mouth daily before breakfast.     LINZESS  145 MCG CAPS capsule Take 1 capsule (145 mcg  total) by mouth daily. 30 capsule 11   losartan  (COZAAR ) 100 MG tablet Take 1 tablet (100 mg total) by mouth daily. 90 tablet 3   MAGNESIUM PO Take 1 tablet by mouth daily.     nitroGLYCERIN  (NITROSTAT ) 0.4 MG SL tablet Place 1 tablet (0.4 mg total) under the tongue every 5 (five) minutes as needed for chest pain. 25 tablet 3   potassium chloride  (KLOR-CON ) 10 MEQ tablet Take 1 tablet (10 mEq total) by mouth daily as needed. Taking 1 tablet when taking Lasix  (Patient taking differently: Take 10 mEq by mouth daily.) 30 tablet 0   rosuvastatin  (CRESTOR ) 20 MG tablet Take 20 mg by mouth daily.     Selenium  200 MCG CAPS Take 200 mcg by mouth daily.     torsemide  (DEMADEX ) 20 MG tablet TAKE 1 TABLET (20 MG TOTAL) BY MOUTH DAILY. CAN TAKE AN ADDITIONAL 20MG  TABLET AS NEEDED 135 tablet 1   Vitamin D , Ergocalciferol , (DRISDOL ) 1.25 MG (50000 UNIT) CAPS capsule 1 PO Q 5 DAYS 6 capsule 0   zinc gluconate 50 MG tablet Take 50 mg by mouth daily.     No current facility-administered medications for this visit.    REVIEW OF SYSTEMS:   [X]  denotes positive finding, [ ]  denotes negative finding Cardiac  Comments:  Chest pain or chest pressure: ***   Shortness of breath upon exertion:    Short of breath when lying flat:    Irregular heart rhythm:        Vascular    Pain in calf, thigh, or hip brought on by ambulation:    Pain in feet at night that wakes you up from your sleep:     Blood clot in your veins:    Leg swelling:         Pulmonary    Oxygen at home:    Productive cough:     Wheezing:         Neurologic    Sudden weakness in arms or legs:     Sudden numbness in arms or legs:     Sudden onset of difficulty speaking or slurred speech:    Temporary loss of vision in one eye:  Problems with dizziness:         Gastrointestinal    Blood in stool:     Vomited blood:         Genitourinary    Burning when urinating:     Blood in urine:        Psychiatric    Major depression:          Hematologic    Bleeding problems:    Problems with blood clotting too easily:        Skin    Rashes or ulcers:        Constitutional    Fever or chills:      PHYSICAL EXAM:   There were no vitals filed for this visit.  GENERAL: The patient is a well-nourished female, in no acute distress. The vital signs are documented above. CARDIAC: There is a regular rate and rhythm.  VASCULAR: *** PULMONARY: Non-labored respirations ABDOMEN: Soft and non-tender with normal pitched bowel sounds.  MUSCULOSKELETAL: There are no major deformities or cyanosis. NEUROLOGIC: No focal weakness or paresthesias are detected. SKIN: There are no ulcers or rashes noted. PSYCHIATRIC: The patient has a normal affect.  STUDIES:   ***  MEDICAL ISSUES:   ***    Malvina Serene CLORE, MD, FACS [facility] [phone] Pager [phone]

## 2024-03-23 ENCOUNTER — Other Ambulatory Visit: Payer: Self-pay | Admitting: Physician Assistant
